# Patient Record
Sex: Female | Born: 1945
Health system: Southern US, Community
[De-identification: ages and names within clinical notes are randomized; demographics above are authoritative.]

## PROBLEM LIST (undated history)

## (undated) DIAGNOSIS — K5730 Diverticulosis of large intestine without perforation or abscess without bleeding: Secondary | ICD-10-CM

## (undated) DIAGNOSIS — L400 Psoriasis vulgaris: Secondary | ICD-10-CM

## (undated) DIAGNOSIS — Z9889 Other specified postprocedural states: Secondary | ICD-10-CM

## (undated) DIAGNOSIS — T8859XA Other complications of anesthesia, initial encounter: Secondary | ICD-10-CM

## (undated) DIAGNOSIS — I059 Rheumatic mitral valve disease, unspecified: Secondary | ICD-10-CM

## (undated) DIAGNOSIS — M1990 Unspecified osteoarthritis, unspecified site: Secondary | ICD-10-CM

## (undated) DIAGNOSIS — J439 Emphysema, unspecified: Secondary | ICD-10-CM

## (undated) DIAGNOSIS — H269 Unspecified cataract: Secondary | ICD-10-CM

## (undated) DIAGNOSIS — A4902 Methicillin resistant Staphylococcus aureus infection, unspecified site: Secondary | ICD-10-CM

## (undated) DIAGNOSIS — R002 Palpitations: Secondary | ICD-10-CM

## (undated) DIAGNOSIS — I10 Essential (primary) hypertension: Secondary | ICD-10-CM

## (undated) DIAGNOSIS — F419 Anxiety disorder, unspecified: Secondary | ICD-10-CM

## (undated) DIAGNOSIS — C801 Malignant (primary) neoplasm, unspecified: Secondary | ICD-10-CM

## (undated) DIAGNOSIS — R011 Cardiac murmur, unspecified: Secondary | ICD-10-CM

## (undated) DIAGNOSIS — K5732 Diverticulitis of large intestine without perforation or abscess without bleeding: Secondary | ICD-10-CM

## (undated) DIAGNOSIS — K219 Gastro-esophageal reflux disease without esophagitis: Secondary | ICD-10-CM

## (undated) DIAGNOSIS — M6702 Short Achilles tendon (acquired), left ankle: Secondary | ICD-10-CM

## (undated) DIAGNOSIS — R000 Tachycardia, unspecified: Secondary | ICD-10-CM

## (undated) DIAGNOSIS — M810 Age-related osteoporosis without current pathological fracture: Secondary | ICD-10-CM

## (undated) DIAGNOSIS — G709 Myoneural disorder, unspecified: Secondary | ICD-10-CM

## (undated) DIAGNOSIS — R112 Nausea with vomiting, unspecified: Secondary | ICD-10-CM

## (undated) DIAGNOSIS — F32A Depression, unspecified: Secondary | ICD-10-CM

## (undated) DIAGNOSIS — K648 Other hemorrhoids: Secondary | ICD-10-CM

## (undated) DIAGNOSIS — J449 Chronic obstructive pulmonary disease, unspecified: Secondary | ICD-10-CM

## (undated) DIAGNOSIS — D649 Anemia, unspecified: Secondary | ICD-10-CM

## (undated) DIAGNOSIS — E559 Vitamin D deficiency, unspecified: Secondary | ICD-10-CM

## (undated) DIAGNOSIS — N736 Female pelvic peritoneal adhesions (postinfective): Secondary | ICD-10-CM

## (undated) DIAGNOSIS — Z5189 Encounter for other specified aftercare: Secondary | ICD-10-CM

## (undated) DIAGNOSIS — R7303 Prediabetes: Secondary | ICD-10-CM

## (undated) DIAGNOSIS — I499 Cardiac arrhythmia, unspecified: Secondary | ICD-10-CM

## (undated) DIAGNOSIS — E7800 Pure hypercholesterolemia, unspecified: Secondary | ICD-10-CM

## (undated) DIAGNOSIS — N879 Dysplasia of cervix uteri, unspecified: Secondary | ICD-10-CM

## (undated) HISTORY — DX: Methicillin resistant Staphylococcus aureus infection, unspecified site: A49.02

## (undated) HISTORY — DX: Anemia, unspecified: D64.9

## (undated) HISTORY — PX: UPPER GASTROINTESTINAL ENDOSCOPY: SHX188

## (undated) HISTORY — PX: ECTOPIC PREGNANCY SURGERY: SHX613

## (undated) HISTORY — DX: Pure hypercholesterolemia, unspecified: E78.00

## (undated) HISTORY — DX: Diverticulosis of large intestine without perforation or abscess without bleeding: K57.30

## (undated) HISTORY — DX: Diverticulitis of large intestine without perforation or abscess without bleeding: K57.32

## (undated) HISTORY — DX: Palpitations: R00.2

## (undated) HISTORY — PX: COLONOSCOPY: SHX174

## (undated) HISTORY — PX: GANGLION CYST EXCISION: SHX1691

## (undated) HISTORY — DX: Dysplasia of cervix uteri, unspecified: N87.9

## (undated) HISTORY — PX: TYMPANOPLASTY: SHX33

## (undated) HISTORY — DX: Psoriasis vulgaris: L40.0

## (undated) HISTORY — DX: Unspecified osteoarthritis, unspecified site: M19.90

## (undated) HISTORY — DX: Vitamin D deficiency, unspecified: E55.9

## (undated) HISTORY — PX: ROTATOR CUFF REPAIR: SHX139

## (undated) HISTORY — DX: Female pelvic peritoneal adhesions (postinfective): N73.6

## (undated) HISTORY — DX: Myoneural disorder, unspecified: G70.9

## (undated) HISTORY — DX: Anxiety disorder, unspecified: F41.9

## (undated) HISTORY — DX: Gastro-esophageal reflux disease without esophagitis: K21.9

## (undated) HISTORY — DX: Emphysema, unspecified: J43.9

## (undated) HISTORY — PX: FINGER SURGERY: SHX640

## (undated) HISTORY — DX: Essential (primary) hypertension: I10

## (undated) HISTORY — DX: Age-related osteoporosis without current pathological fracture: M81.0

## (undated) HISTORY — PX: HEMORRHOID SURGERY: SHX153

## (undated) HISTORY — DX: Rheumatic mitral valve disease, unspecified: I05.9

## (undated) HISTORY — PX: OOPHORECTOMY: SHX86

## (undated) HISTORY — DX: Unspecified cataract: H26.9

## (undated) HISTORY — PX: EYE SURGERY: SHX253

## (undated) HISTORY — DX: Prediabetes: R73.03

## (undated) HISTORY — DX: Cardiac murmur, unspecified: R01.1

## (undated) HISTORY — DX: Tachycardia, unspecified: R00.0

## (undated) HISTORY — DX: Encounter for other specified aftercare: Z51.89

## (undated) HISTORY — PX: ESOPHAGOGASTRODUODENOSCOPY: SHX1529

## (undated) HISTORY — DX: Other hemorrhoids: K64.8

---

## 1974-08-29 DIAGNOSIS — N879 Dysplasia of cervix uteri, unspecified: Secondary | ICD-10-CM

## 1974-08-29 HISTORY — DX: Dysplasia of cervix uteri, unspecified: N87.9

## 1974-08-29 HISTORY — PX: GYNECOLOGIC CRYOSURGERY: SHX857

## 1998-01-11 ENCOUNTER — Inpatient Hospital Stay (HOSPITAL_COMMUNITY): Admission: EM | Admit: 1998-01-11 | Discharge: 1998-01-12 | Payer: Self-pay | Admitting: Emergency Medicine

## 1998-01-13 ENCOUNTER — Encounter (HOSPITAL_COMMUNITY): Admission: RE | Admit: 1998-01-13 | Discharge: 1998-04-13 | Payer: Self-pay

## 1998-04-13 ENCOUNTER — Ambulatory Visit (HOSPITAL_COMMUNITY): Admission: RE | Admit: 1998-04-13 | Discharge: 1998-04-13 | Payer: Self-pay | Admitting: *Deleted

## 1998-05-31 ENCOUNTER — Emergency Department (HOSPITAL_COMMUNITY): Admission: EM | Admit: 1998-05-31 | Discharge: 1998-05-31 | Payer: Self-pay | Admitting: Emergency Medicine

## 1998-05-31 ENCOUNTER — Encounter: Payer: Self-pay | Admitting: Emergency Medicine

## 1998-08-27 ENCOUNTER — Other Ambulatory Visit: Admission: RE | Admit: 1998-08-27 | Discharge: 1998-08-27 | Payer: Self-pay | Admitting: Obstetrics and Gynecology

## 1998-08-29 HISTORY — PX: ABDOMINAL HYSTERECTOMY: SHX81

## 1999-03-29 ENCOUNTER — Ambulatory Visit (HOSPITAL_COMMUNITY): Admission: RE | Admit: 1999-03-29 | Discharge: 1999-03-29 | Payer: Self-pay | Admitting: Gastroenterology

## 1999-04-19 ENCOUNTER — Inpatient Hospital Stay (HOSPITAL_COMMUNITY): Admission: RE | Admit: 1999-04-19 | Discharge: 1999-04-22 | Payer: Self-pay | Admitting: Obstetrics and Gynecology

## 1999-04-19 ENCOUNTER — Encounter (INDEPENDENT_AMBULATORY_CARE_PROVIDER_SITE_OTHER): Payer: Self-pay | Admitting: Specialist

## 1999-07-02 ENCOUNTER — Ambulatory Visit (HOSPITAL_COMMUNITY): Admission: RE | Admit: 1999-07-02 | Discharge: 1999-07-02 | Payer: Self-pay | Admitting: *Deleted

## 2000-09-27 ENCOUNTER — Ambulatory Visit (HOSPITAL_BASED_OUTPATIENT_CLINIC_OR_DEPARTMENT_OTHER): Admission: RE | Admit: 2000-09-27 | Discharge: 2000-09-27 | Payer: Self-pay | Admitting: *Deleted

## 2000-10-18 ENCOUNTER — Ambulatory Visit (HOSPITAL_COMMUNITY): Admission: RE | Admit: 2000-10-18 | Discharge: 2000-10-18 | Payer: Self-pay | Admitting: Internal Medicine

## 2000-10-18 ENCOUNTER — Encounter: Payer: Self-pay | Admitting: Internal Medicine

## 2000-11-02 ENCOUNTER — Other Ambulatory Visit: Admission: RE | Admit: 2000-11-02 | Discharge: 2000-11-02 | Payer: Self-pay | Admitting: Obstetrics and Gynecology

## 2001-11-06 ENCOUNTER — Ambulatory Visit (HOSPITAL_COMMUNITY): Admission: RE | Admit: 2001-11-06 | Discharge: 2001-11-06 | Payer: Self-pay | Admitting: Internal Medicine

## 2001-11-06 ENCOUNTER — Encounter: Payer: Self-pay | Admitting: Internal Medicine

## 2002-02-26 ENCOUNTER — Other Ambulatory Visit: Admission: RE | Admit: 2002-02-26 | Discharge: 2002-02-26 | Payer: Self-pay | Admitting: Obstetrics and Gynecology

## 2002-03-21 ENCOUNTER — Emergency Department (HOSPITAL_COMMUNITY): Admission: EM | Admit: 2002-03-21 | Discharge: 2002-03-21 | Payer: Self-pay | Admitting: Emergency Medicine

## 2002-03-24 ENCOUNTER — Emergency Department (HOSPITAL_COMMUNITY): Admission: EM | Admit: 2002-03-24 | Discharge: 2002-03-24 | Payer: Self-pay | Admitting: *Deleted

## 2002-05-20 ENCOUNTER — Ambulatory Visit (HOSPITAL_COMMUNITY): Admission: RE | Admit: 2002-05-20 | Discharge: 2002-05-20 | Payer: Self-pay | Admitting: Gastroenterology

## 2002-05-20 ENCOUNTER — Encounter: Payer: Self-pay | Admitting: Gastroenterology

## 2003-04-30 ENCOUNTER — Other Ambulatory Visit: Admission: RE | Admit: 2003-04-30 | Discharge: 2003-04-30 | Payer: Self-pay | Admitting: Obstetrics and Gynecology

## 2003-06-17 ENCOUNTER — Emergency Department (HOSPITAL_COMMUNITY): Admission: EM | Admit: 2003-06-17 | Discharge: 2003-06-17 | Payer: Self-pay | Admitting: Emergency Medicine

## 2004-07-13 ENCOUNTER — Ambulatory Visit: Payer: Self-pay | Admitting: Cardiovascular Disease

## 2004-12-21 ENCOUNTER — Ambulatory Visit: Payer: Self-pay

## 2005-01-25 ENCOUNTER — Ambulatory Visit (HOSPITAL_COMMUNITY): Admission: RE | Admit: 2005-01-25 | Discharge: 2005-01-25 | Payer: Self-pay | Admitting: Internal Medicine

## 2005-01-28 ENCOUNTER — Ambulatory Visit (HOSPITAL_COMMUNITY): Admission: RE | Admit: 2005-01-28 | Discharge: 2005-01-28 | Payer: Self-pay | Admitting: Internal Medicine

## 2005-09-07 ENCOUNTER — Ambulatory Visit (HOSPITAL_COMMUNITY): Admission: RE | Admit: 2005-09-07 | Discharge: 2005-09-07 | Payer: Self-pay | Admitting: Internal Medicine

## 2006-06-07 ENCOUNTER — Ambulatory Visit (HOSPITAL_COMMUNITY): Admission: RE | Admit: 2006-06-07 | Discharge: 2006-06-07 | Payer: Self-pay | Admitting: Internal Medicine

## 2006-06-27 ENCOUNTER — Encounter: Admission: RE | Admit: 2006-06-27 | Discharge: 2006-06-27 | Payer: Self-pay | Admitting: Internal Medicine

## 2006-07-13 ENCOUNTER — Ambulatory Visit: Payer: Self-pay | Admitting: Cardiovascular Disease

## 2006-09-27 ENCOUNTER — Ambulatory Visit: Payer: Self-pay | Admitting: Gastroenterology

## 2006-10-10 ENCOUNTER — Ambulatory Visit: Payer: Self-pay | Admitting: Gastroenterology

## 2006-12-01 ENCOUNTER — Ambulatory Visit (HOSPITAL_COMMUNITY): Admission: RE | Admit: 2006-12-01 | Discharge: 2006-12-02 | Payer: Self-pay | Admitting: Surgery

## 2006-12-01 ENCOUNTER — Encounter (INDEPENDENT_AMBULATORY_CARE_PROVIDER_SITE_OTHER): Payer: Self-pay | Admitting: Specialist

## 2007-01-20 ENCOUNTER — Encounter: Admission: RE | Admit: 2007-01-20 | Discharge: 2007-01-20 | Payer: Self-pay | Admitting: Orthopedic Surgery

## 2007-01-29 ENCOUNTER — Ambulatory Visit: Payer: Self-pay | Admitting: Cardiovascular Disease

## 2007-03-06 ENCOUNTER — Encounter: Admission: RE | Admit: 2007-03-06 | Discharge: 2007-03-21 | Payer: Self-pay | Admitting: Orthopedic Surgery

## 2007-07-12 ENCOUNTER — Encounter: Admission: RE | Admit: 2007-07-12 | Discharge: 2007-07-12 | Payer: Self-pay | Admitting: Internal Medicine

## 2008-04-05 ENCOUNTER — Emergency Department (HOSPITAL_COMMUNITY): Admission: EM | Admit: 2008-04-05 | Discharge: 2008-04-05 | Payer: Self-pay | Admitting: Emergency Medicine

## 2008-04-07 ENCOUNTER — Telehealth: Payer: Self-pay | Admitting: Gastroenterology

## 2008-04-22 ENCOUNTER — Ambulatory Visit: Payer: Self-pay | Admitting: Cardiovascular Disease

## 2008-05-06 ENCOUNTER — Ambulatory Visit: Payer: Self-pay

## 2008-05-06 ENCOUNTER — Ambulatory Visit: Payer: Self-pay | Admitting: Gastroenterology

## 2008-05-06 ENCOUNTER — Encounter: Payer: Self-pay | Admitting: Cardiovascular Disease

## 2008-05-06 DIAGNOSIS — K5730 Diverticulosis of large intestine without perforation or abscess without bleeding: Secondary | ICD-10-CM

## 2008-05-06 DIAGNOSIS — K5732 Diverticulitis of large intestine without perforation or abscess without bleeding: Secondary | ICD-10-CM

## 2008-05-06 HISTORY — DX: Diverticulosis of large intestine without perforation or abscess without bleeding: K57.30

## 2008-05-23 ENCOUNTER — Ambulatory Visit: Payer: Self-pay | Admitting: Gastroenterology

## 2008-06-02 ENCOUNTER — Ambulatory Visit: Payer: Self-pay | Admitting: Gastroenterology

## 2008-06-03 ENCOUNTER — Ambulatory Visit: Payer: Self-pay | Admitting: Gastroenterology

## 2008-07-14 ENCOUNTER — Encounter: Admission: RE | Admit: 2008-07-14 | Discharge: 2008-07-14 | Payer: Self-pay | Admitting: Internal Medicine

## 2008-07-29 ENCOUNTER — Ambulatory Visit: Payer: Self-pay | Admitting: Gastroenterology

## 2008-07-31 ENCOUNTER — Other Ambulatory Visit: Admission: RE | Admit: 2008-07-31 | Discharge: 2008-07-31 | Payer: Self-pay | Admitting: Obstetrics and Gynecology

## 2008-07-31 ENCOUNTER — Encounter: Payer: Self-pay | Admitting: Obstetrics and Gynecology

## 2008-07-31 ENCOUNTER — Ambulatory Visit: Payer: Self-pay | Admitting: Obstetrics and Gynecology

## 2008-08-11 ENCOUNTER — Ambulatory Visit: Payer: Self-pay | Admitting: Obstetrics and Gynecology

## 2008-09-02 ENCOUNTER — Ambulatory Visit: Payer: Self-pay | Admitting: Obstetrics and Gynecology

## 2008-09-23 ENCOUNTER — Ambulatory Visit: Payer: Self-pay | Admitting: Gastroenterology

## 2008-10-08 ENCOUNTER — Ambulatory Visit: Payer: Self-pay

## 2008-10-08 ENCOUNTER — Ambulatory Visit: Payer: Self-pay | Admitting: Cardiovascular Disease

## 2008-11-05 ENCOUNTER — Ambulatory Visit: Payer: Self-pay | Admitting: Cardiovascular Disease

## 2008-12-01 ENCOUNTER — Ambulatory Visit: Payer: Self-pay | Admitting: Gastroenterology

## 2009-01-13 ENCOUNTER — Ambulatory Visit: Admission: RE | Admit: 2009-01-13 | Discharge: 2009-01-13 | Payer: Self-pay | Admitting: Internal Medicine

## 2009-01-27 ENCOUNTER — Ambulatory Visit: Payer: Self-pay | Admitting: Gastroenterology

## 2009-03-23 ENCOUNTER — Ambulatory Visit: Payer: Self-pay | Admitting: Gastroenterology

## 2009-04-23 ENCOUNTER — Encounter (INDEPENDENT_AMBULATORY_CARE_PROVIDER_SITE_OTHER): Payer: Self-pay | Admitting: *Deleted

## 2009-06-02 ENCOUNTER — Ambulatory Visit: Payer: Self-pay | Admitting: Gastroenterology

## 2009-06-16 DIAGNOSIS — I059 Rheumatic mitral valve disease, unspecified: Secondary | ICD-10-CM | POA: Insufficient documentation

## 2009-06-18 ENCOUNTER — Ambulatory Visit: Payer: Self-pay | Admitting: Cardiovascular Disease

## 2009-07-28 ENCOUNTER — Ambulatory Visit: Payer: Self-pay | Admitting: Gastroenterology

## 2009-08-04 ENCOUNTER — Ambulatory Visit: Payer: Self-pay | Admitting: Obstetrics and Gynecology

## 2009-08-04 ENCOUNTER — Encounter: Admission: RE | Admit: 2009-08-04 | Discharge: 2009-08-04 | Payer: Self-pay | Admitting: Internal Medicine

## 2009-08-04 ENCOUNTER — Other Ambulatory Visit: Admission: RE | Admit: 2009-08-04 | Discharge: 2009-08-04 | Payer: Self-pay | Admitting: Obstetrics and Gynecology

## 2009-09-28 ENCOUNTER — Ambulatory Visit: Payer: Self-pay | Admitting: Gastroenterology

## 2009-12-01 ENCOUNTER — Ambulatory Visit: Payer: Self-pay | Admitting: Gastroenterology

## 2010-01-26 ENCOUNTER — Ambulatory Visit (HOSPITAL_COMMUNITY): Admission: RE | Admit: 2010-01-26 | Discharge: 2010-01-26 | Payer: Self-pay | Admitting: Internal Medicine

## 2010-01-26 ENCOUNTER — Ambulatory Visit: Payer: Self-pay | Admitting: Gastroenterology

## 2010-03-22 ENCOUNTER — Ambulatory Visit: Payer: Self-pay | Admitting: Gastroenterology

## 2010-06-01 ENCOUNTER — Ambulatory Visit: Payer: Self-pay | Admitting: Gastroenterology

## 2010-06-25 ENCOUNTER — Ambulatory Visit: Payer: Self-pay | Admitting: Cardiovascular Disease

## 2010-08-09 ENCOUNTER — Encounter
Admission: RE | Admit: 2010-08-09 | Discharge: 2010-08-09 | Payer: Self-pay | Source: Home / Self Care | Attending: Internal Medicine | Admitting: Internal Medicine

## 2010-08-10 ENCOUNTER — Other Ambulatory Visit
Admission: RE | Admit: 2010-08-10 | Discharge: 2010-08-10 | Payer: Self-pay | Source: Home / Self Care | Admitting: Obstetrics and Gynecology

## 2010-08-10 ENCOUNTER — Ambulatory Visit: Payer: Self-pay | Admitting: Obstetrics and Gynecology

## 2010-09-18 ENCOUNTER — Encounter: Payer: Self-pay | Admitting: Internal Medicine

## 2010-09-28 NOTE — Procedures (Signed)
Summary: Flexible Sigmoidoscopy  Patient: [name] [name] Note: All result statuses are Final unless otherwise noted.  Tests: (1) Flexible Sigmoidoscopy (FLX)  FLX Flexible Sigmoidoscopy                             DONE     [facility]     [address]     [place], [place]  [address]           FLEXIBLE SIGMOIDOSCOPY PROCEDURE REPORT           PATIENT:  [name], [name]  MR#:  [id number]     BIRTHDATE:  [birth year], 63 yrs. old  GENDER:  female           ENDOSCOPIST:  Barbette Hair. Arlyce Dice, MD     Referred by:           PROCEDURE DATE:  06/01/2010     PROCEDURE:  Flexible Sigmoidoscopy with biopsy     ASA CLASS:  Class II     INDICATIONS:  drug study           MEDICATIONS:   Fentanyl 50 mcg IV, Versed 6 mg IV           DESCRIPTION OF PROCEDURE:   After the risks benefits and     alternatives of the procedure were thoroughly explained, informed     consent was obtained.  Digital rectal exam was performed and     revealed no abnormalities.   The [id number] endoscope was     introduced through the anus and advanced to the descending colon     (60cm)  without limitations.  .  The instrument was then slowly     withdrawn as the mucosa was fully examined.     <<PROCEDUREIMAGES>>           Moderate diverticulosis was found. Moderately severe     diverticulosis with greater than 15 side-mouthed diverticula,     extending from midsigmoid to descending colon (see image1 and     image2). Biopsy taken at 30cm per protocol  The examination was     otherwise normal (see image3 and image4).   Retroflexed views in     the rectum revealed not performed.    The scope was then withdrawn     from the patient and the procedure terminated.           COMPLICATIONS:  None           ENDOSCOPIC IMPRESSION:     1) Moderate diverticulosis     2) Otherwise normal examination.           Recommendations: per protocol           REPEAT EXAM:  No           ______________________________     Barbette Hair. Arlyce Dice, MD           CC:           n.     eSIGNED:   Barbette Hair. Kaplan at 06/01/2010 12:16 PM           [name], [name], [id number]  Note: An exclamation mark (!) indicates a result that was not dispersed into the flowsheet. Document Creation Date: 06/01/2010 12:16 PM _______________________________________________________________________  (1) Order result status: Final Collection or observation date-time: 06/01/2010 12:10 Requested date-time:  Receipt date-time:  Reported date-time:  Referring Physician:   Ordering Physician: Melvia Heaps [phone])  Specimen Source:  Source: Launa Grill Order Number: [id number] Lab site:

## 2010-09-28 NOTE — Assessment & Plan Note (Signed)
Summary: Z6X      Allergies Added: NKDA   Primary Provider:  Marisue Brooklyn, MD   History of Present Illness: [name] [name] is seen today for F/U of palpitatiions, PAC's and smoking  She denies SSCP, dyspnea, palpitations or syncope.  She has no known structural heart disease.  She continues to smoke 1ppd.  She enjoys it and it is not stress related.  She has quit on muliple occasions most recently this summer with the aid of Chantix.  Behavior modicfication aids were discussed as well as setting a date.  She indicates she may want to use nicotine patches and this would be fine.  Long term risk of vascular disease and cancer were discussed.  No niew issues with primary Dr Elisabeth Most who has checked a CXR this year  ECG reviewed today is normal SR 92  Lipid Management History:      Positive NCEP/ATP III risk factors include female age 64 years old or older and current tobacco user.    Current Problems (verified): 1)  Mixed Hyperlipidemia  (ICD-272.2) 2)  Smoker  (ICD-305.1) 3)  Palpitations  (ICD-785.1) 4)  Mitral Valve Prolapse  (ICD-424.0) 5)  Diverticulitis-colon  (ICD-562.11) 6)  Hemorrhoids, External  (ICD-455.3) 7)  Hemorrhoids, Internal  (ICD-455.0) 8)  Diverticulosis, Colon  (ICD-562.10)  Current Medications (verified): 1)  Metoprolol Tartrate 25 Mg Tabs (Metoprolol Tartrate) .... Take One Tablet By Mouth Twice A Day 2)  Ambien 10 Mg Tabs (Zolpidem Tartrate) .... As Needed 3)  Pravastatin Sodium 20 Mg Tabs (Pravastatin Sodium) .... Take One Tablet By Mouth Daily At Bedtime 4)  Cvs Vit D 5000 High-Potency 5000 Unit Caps (Cholecalciferol) .Marland Kitchen.. 1 Tab By Mouth Once Daily  Allergies (verified): No Known Drug Allergies  Past History:  Past Medical History: Last updated: 06/16/2009 Current Problems:  PALPITATIONS (ICD-785.1) MITRAL VALVE PROLAPSE (ICD-424.0) DIVERTICULITIS-COLON (ICD-562.11) HEMORRHOIDS, EXTERNAL (ICD-455.3) HEMORRHOIDS, INTERNAL (ICD-455.0) DIVERTICULOSIS,  COLON (ICD-562.10)  Past Surgical History: Last updated: 05/06/2008 Artificial eardrum Etopic pregnancy x 2 Hemorrhoidectomy Hysterectomy Rotator Cuff Repair  Family History: Last updated: 05/06/2008 Family History of Colon Cancer:Grandmother Family History of Uterine Cancer:Sister Family History of Heart Disease: Father Family History of Irritable Bowel Syndrome:Mother  Social History: Last updated: 05/06/2008 Occupation: Receiptionist Patient currently smokes.  Alcohol Use - yes- wine Daily Caffeine Use 2 cups Illicit Drug Use - no Patient does not get regular exercise.   Review of Systems       Denies fever, malais, weight loss, blurry vision, decreased visual acuity, cough, sputum, SOB, hemoptysis, pleuritic pain, palpitaitons, heartburn, abdominal pain, melena, lower extremity edema, claudication, or rash.   Vital Signs:  Patient profile:   65 year old female Height:      64 inches Weight:      133 pounds BMI:     22.91 Pulse rate:   92 / minute Resp:     12 per minute BP sitting:   120 / 74  (left arm)  Vitals Entered By: Kem Parkinson (June 25, 2010 2:25 PM)  Physical Exam  General:  Affect appropriate Healthy:  appears stated age HEENT: normal Neck supple with no adenopathy JVP normal no bruits no thyromegaly Lungs clear with no wheezing and good diaphragmatic motion Heart:  S1/S2 no murmur,rub, gallop or click PMI normal Abdomen: benighn, BS positve, no tenderness, no AAA no bruit.  No HSM or HJR Distal pulses intact with no bruits No edema Neuro non-focal Skin warm and dry    Impression & Recommendations:  Problem #  1:  MIXED HYPERLIPIDEMIA (ICD-272.2) Continue statin labs with primary Her updated medication list for this problem includes:    Pravastatin Sodium 20 Mg Tabs (Pravastatin sodium) .Marland Kitchen... Take one tablet by mouth daily at bedtime  Problem # 2:  SMOKER (ICD-305.1) Chantix or patches to be started Encouraged her to call [name]  with concerns or referal to [facility]  Problem # 3:  PALPITATIONS (ICD-785.1) Resolved on BB with no structural heart disease Her updated medication list for this problem includes:    Metoprolol Tartrate 25 Mg Tabs (Metoprolol tartrate) .Marland Kitchen... Take one tablet by mouth twice a day  Problem # 4:  MITRAL VALVE PROLAPSE (ICD-424.0) No murmur on exam stable No need for SBE Her updated medication list for this problem includes:    Metoprolol Tartrate 25 Mg Tabs (Metoprolol tartrate) .Marland Kitchen... Take one tablet by mouth twice a day  Lipid Assessment/Plan:      Based on NCEP/ATP III, the patient's risk factor category is "2 or more risk factors and a calculated 10 year CAD risk of > 20%".  The patient's lipid goals are as follows: Total cholesterol goal is 200; LDL cholesterol goal is 130; HDL cholesterol goal is 40; Triglyceride goal is 150.    Patient Instructions: 1)  Your physician recommends that you schedule a follow-up appointment in: ONE YEAR

## 2011-01-11 NOTE — Assessment & Plan Note (Signed)
[facility]                            CARDIOLOGY OFFICE NOTE   NAME:[name] [name], [name] [name]                   MRN:          [id number]  DATE:10/08/2008                            DOB:          06/29/1946    [name] returns today for followup.  She has had increasing  palpitations that have been going on for a last few months that  typically occur at night.  I think they can be related to stress.  She  also drinks wine 2-3 times per week and this can sometimes exacerbate  her palpitations.  She had quit smoking up and until this past October.  Unfortunately, she started again.  She states she just enjoys it.  She  is not a stress smoker.  She initially quit with help of Chantix.   Since I last saw the patient, the palpitations have been almost on a  daily basis.  They are rapid.  They last anywhere from 5-10 minutes.  They can be recurrent.  They are not associated with shortness of  breath, diaphoresis, or chest pain.  She has not had syncope.   Outside of wine and stress, the patient does not know any other  exacerbating factors and there is nothing that she can do to easily  relieve them.   She has been on a beta-blocker and has been compliant with this.   REVIEW OF SYSTEMS:  Remarkable for 2 bouts of diverticulitis this fall.  She is on a study drug called Lialda per Dr. Arlyce Dice.   ALLERGIES:  She has no known allergies.   MEDICATIONS:  She is otherwise on;  1. Zolpidem 10 mg p.r.n.  2. Lopressor 25 b.i.d.  3. Research study drug. She uses  __________.   PHYSICAL EXAMINATION:  GENERAL:  Remarkable for an elderly female in no  distress.  VITAL SIGNS:  She weighs 133; blood pressure 116/76; pulse 79 and  regular; and respiratory rate 14, afebrile.  HEENT:  Unremarkable.  NECK:  Carotids are normal without bruit.  No lymphadenopathy,  thyromegaly, or JVP elevation.  LUNGS:  Clear, good diaphragmatic motion.  No wheezing.  CARDIAC:  S1  and S2.  Normal heart sounds.  PMI normal.  ABDOMEN:  Benign.  Bowel sounds positive.  No AAA.  No tenderness.  No  bruit.  No hepatosplenomegaly.  No hepatojugular reflux.  EXTREMITIES:  Distal pulses are intact.  No edema.  NEURO:  Nonfocal.  SKIN:  Warm and dry.  MUSCULOSKELETAL:  No muscular weakness.   IMPRESSION:  1. Palpitations, likely benign.  Continue beta-blocker.  Follow up      with event monitor.  Reassess in 4 weeks.  2. Alcohol intake, moderate.  However, does exacerbate palpitations.      Try to decrease intake.  3. Diverticulitis.  Follow up with Dr. Arlyce Dice.  Continue with research      study drug.  She may be on placebo, but hopefully she will not have      any recurrences.  4. Smoking cessation.  Encouraged the patient to restart Chantix,      offered to refill  for her.  I suspect these are also exacerbating      her palpitations given the nicotine load.   Further recommendations will be based on the results of her event  monitor and followup visit in 4 weeks.     [name]. Eden Emms, MD, [facility]  Electronically Signed    PCN/MedQ  DD: 10/08/2008  DT: 10/09/2008  Job #: [id number]

## 2011-01-11 NOTE — Assessment & Plan Note (Signed)
[facility]                            CARDIOLOGY OFFICE NOTE   NAME:[name] [name], [name] [name]                   MRN:          [id number]  DATE:11/05/2008                            DOB:          26-Feb-1946    [place] returns today for followup.  She has had a distant history of  mitral valve prolapse with palpitations.  Her event monitor was  reviewed, there were over 16 pages.  She had sinus rhythm and sinus  arrhythmia with no significant PACs, PVCs, or tachy palpitations.   I told the patient that I thought her palpitations were benign and they  may not even represent cardiac malfunction.   I suspect she is having muscle twitches.  She has been on chronic low-  dose beta-blocker since she feels better on them.   She continues to smoke, we talked about this.  She has quit twice in the  past, once for a year and once for 5 years.  She had good results with  Chantix.  She continues to want to think about picking a time to quit  with her husband.  She will call me if she wants to restart her Chantix,  otherwise she is doing well.  She is not having chest pain, PND, or  orthopnea.  There is no lower extremity edema.  No syncope.  No  exertional dyspnea.  Affect and mood are appropriate.  She has not had  any fevers, chills, URIs, and there have been no intercurrent medical  illnesses or hospitalizations.   MEDICATIONS:  1. She is on zolpidem 10 mg a day.  2. Lopressor 25 b.i.d.   PHYSICAL EXAMINATION:  GENERAL:  Remarkable for healthy-appearing female  in no distress.  VITAL SIGNS:  Blood pressure is 120/70, pulse 70 and regular, weight  134, respirations 14.  HEENT:  Unremarkable.  NECK:  Carotids are normal without bruit.  No lymphadenopathy,  thyromegaly, or JVP elevation.  LUNGS:  Clear.  Good diaphragmatic motion.  No wheezing.  CARDIAC:  S1, S2.  Normal heart sounds.  PMI normal.  ABDOMEN:  Benign.  Bowel sounds positive.  No AAA, no  tenderness, no  bruit.  No hepatosplenomegaly or hepatojugular reflux.  No tenderness.  EXTREMITIES:  No edema.  NEUROLOGIC:  Nonfocal.  SKIN:  Warm and dry.  MUSCULOSKELETAL:  No muscular weakness.   IMPRESSION:  1. Palpitations, benign.  No evidence of arrhythmia on event monitor.      Continue low-dose beta-blocker.  2. Smoking cessation.  Continue to try to cut back.  The patient will      call for [name] to call in Chantix.  She will hopefully set up a quit      date with her husband soon.     [name]. Eden Emms, MD, [facility]  Electronically Signed    PCN/MedQ  DD: 11/05/2008  DT: 11/06/2008  Job #: [id number]

## 2011-01-11 NOTE — Assessment & Plan Note (Signed)
[facility]                            CARDIOLOGY OFFICE NOTE   NAME:Antonelli, IDALYS KONECNY                   MRN:          [id number]  DATE:01/29/2007                            DOB:          [birth year]    [name] is seen today in followup.  I have seen her for palpitations  in the past, they have resolved. She has not had any significant  recurrences.  There has been no significant chest pain, PND or  orthopnea.  Her palpitations in the past were thought to be benign.  She  has no significant structural heart disease.   I have also followed her for her smoking.   She has been Chantix since last October.  She had about a 4 week period  where she had stopped it.  There is a question of her being more testy  and having a shorter temper.   We talked about this for a bit today.  She was counseled for less than  10 minutes.  We came up with a game plan to possibly switch her to  Wellbutrin 150 XL once a day, to try to get her off the Chantix and have  her not relapse with her smoking.  She will followup with Marisue Brooklyn,  D.O.  If she decides to do this, I gave a prescription today.  We talked  a bit also about the recent reports about changes in mental status,  dreams and erratic behavior on Chantix.   Fortunately though, Latria has not smoked since October.   Her cardiac risk factors are otherwise well modified.  She had smoked  1/2 pack a day for greater than 25 years.  She does have a positive  family history for coronary disease.   REVIEW OF SYSTEMS:  Otherwise negative.   EXAM TODAY:  Is remarkable for a healthy appearing middle-aged female  with premature graying hair.  Weight is 149, blood pressure 112/64,  pulse 82 and regular, respiratory rate is 12, she is afebrile.  HEENT:  Normal.  NECK:  Supple.  There is no thyromegaly, no lymphadenopathy, no JVP  elevation, no thyromegaly.  LUNGS:  Clear with no wheezing and normal  diaphragmatic motion.  There  is an S1, S2 with normal heart sounds.  PMI is normal.  ABDOMEN:  Benign.  Bowel sounds are positive.  There is no tenderness,  no masses, no organomegaly, no hepatosplenomegaly, no hepatojugular  reflux, no AAA.  Femorals are +3 bilaterally without bruit, PTs are +2 bilaterally.  There is no lower extremity edema or varicosities.  NEURO:  Nonfocal.  SKIN:  Warm and dry.  There is no muscular weakness.   Her only medications included Chantix 1 mg b.i.d.   IMPRESSION:  1. Stable.  Previous palpitations resolved.  No further therapy      indicated.  Previously normal Myoview and echocardiogram, no      evident structural heart disease.  2. Smoking.  On Chantix.  Will likely switch to Wellbutrin XL 150 a      day and try to continue to abstain from her smoking.  Otherwise, she is doing fairly well.  She will follow up with her  primary M.D.  Her EKG was totally normal today.     [name]. Eden Emms, MD, [facility]  Electronically Signed    PCN/MedQ  DD: 01/29/2007  DT: 01/29/2007  Job #: [id number]

## 2011-01-11 NOTE — Assessment & Plan Note (Signed)
[facility]                            CARDIOLOGY OFFICE NOTE   NAME:[name] [name], [name] [name]                   MRN:          [id number]  DATE:04/22/2008                            DOB:          [birth year]    J[place]ica returns today in followup.  I have seen her in the past for  benign palpitations.  She has not had previous evidence of structural  heart disease, but she has not had a stress test or echo in quite some  time.  She has had increasing palpitations over the last few months.  Unfortunately, she started smoking again.  I talked to her at length  about this.  She is smoking about a quarter-pack a day.  She previously  has quit with the help of Chantix and Wellbutrin.  She does not seem  motivated to quit at this time.  She is having increasing problems with  diverticulitis and needs to see Dr. Arlyce Dice.  She was in the ER recently  for it.   Her palpitations sound benign.  In fact, she did have some PACs and PVCs  here in the office.   I suspect that some of the pain from her diverticulitis and smoking with  nicotine have increased them.  I told her that we would probably  reassess her LV function to make sure it is still normal.  She had  previously been taking Lopressor, her prescription is out-of-date.  I  told her for the time being she should go back on it a half a tablet  twice a day.   Review of systems is otherwise negative.   PHYSICAL EXAMINATION:  GENERAL:  Her exam is remarkable for healthy-  appearing elderly white female in no distress.  VITAL SIGNS:  Pulse is 77 with occasional PACs and PVCs.  Blood pressure  is 127/78, respiratory rate 14, and afebrile.  Weight is 135.  HEENT:  Unremarkable.  NECK:  Carotids are without bruit.  No lymphadenopathy, thyromegaly, or  JVP elevation.  LUNGS:  Clear with good diaphragmatic motion.  No wheezing.  HEART:  S1 and S2.  Normal heart sounds.  PMI normal.  ABDOMEN:  Benign.  Bowel sounds  positive.  No bruit, no tenderness, and  no hepatosplenomegaly, and hepatojugular reflux.  No tenderness.  EXTREMITIES:  Distal pulses are intact.  No edema.  NEURO:  Nonfocal.  SKIN:  Warm and dry.  MUSCULOSKELETAL:  No muscular weakness.   EKG shows sinus rhythm with premature atrial conduction.   IMPRESSION:  1. Palpitations with premature atrial contractions and premature      ventricular contractions.  Start Lopressor 25 b.i.d.  Follow up in      6 months.  Check 2-D echocardiogram to reassess RV and LV function.  2. Diverticulitis.  High-fiber diet.  Follow up with Dr. Arlyce Dice from      GI.  Consider followup colonoscopy.  3. Smoking cessation.  Counseled for less than 10 minutes.  The      patient will call us if she wants Wellbutrin or Chantix.  I called      back in  long-term risk and terms of developing cardiovascular      disease and lung cancer were discussed at length.  I will see her      back in 6 months unless her palpitations would worsen in which case      she will need an event monitor.     [name]. Eden Emms, MD, [facility]  Electronically Signed    PCN/MedQ  DD: 04/22/2008  DT: 04/23/2008  Job #: [id number]

## 2011-01-14 NOTE — Op Note (Signed)
[place]  Patient:    [name] [name], [name] [name]                      MRN: [id number] Proc. Date: 09/27/00 Adm. Date:  [id number] Attending:  Kendell Bane CC:         Marinus Maw, M.D.   Operative Report  PREOPERATIVE DIAGNOSIS:  Septic arthritis, interphalangeal joint of left thumb.  POSTOPERATIVE DIAGNOSIS:  Septic arthritis, interphalangeal joint of left thumb.  OPERATION PERFORMED:  Arthrotomy for incision and drainage of interphalangeal joint of left thumb.  SURGEON:  Lowell Bouton, M.D.  ANESTHESIA:  General.  OPERATIVE FINDINGS:  The patient had a previous dog bite with a puncture wound directly over the interphalangeal joint.  There was serosanguinous fluid present in the joint upon opening it.  DESCRIPTION OF PROCEDURE:  Under general anesthesia with a tourniquet on the left arm, the left hand was prepped and draped in the usual fashion and after elevating the limb, the tourniquet was inflated to 250 mmHg.  A transverse incision was made on the dorsum of the IP joint of the left thumb.  Blunt dissection was carried down to the extensor tendon and the joint was entered just ulnar to the tendon.  The synovium was opened with the scissors and cultures were obtained.  There was serous and sanguinous type fluid present with no gross purulence.  After obtaining aerobic and anaerobic cultures, the joint was irrigated copiously with saline.  An iodoform packing was inserted and the wound was left open.  Sterile dressings were applied.  The tourniquet was released with good circulation.  The patient went to the recovery room awake and stable in good condition. DD:  09/27/00 TD:  09/27/00 Job: [id number]

## 2011-01-14 NOTE — Assessment & Plan Note (Signed)
 HEALTHCARE                         GASTROENTEROLOGY OFFICE NOTE   NAME:Raya, NASTEHO GLANTZ                   MRN:          [id number]  DATE:09/27/2006                            DOB:          07-28-1946    PROBLEM:  Rectal bleeding.   Mrs. [place] has returned for evaluation of bleeding.  She is having  bright red blood per rectum with bowel movements.  She has protruding  hemorrhoids.  She has a history of hemorrhoids which were ligated in  2003.  She denies abdominal or rectal pain.   PAST MEDICAL HISTORY:  Is pertinent for arrhythmias.  She is status post  hysterectomy.   FAMILY HISTORY:  Is pertinent for a sister with uterine cancer and  father with heart disease.   MEDICATIONS:  Include Chantix.   She has no allergies.   She neither smokes nor drinks. She is married, works as a Scientist, physiological.   REVIEW OF SYSTEMS:  Is positive for joint pains and back pain.   PHYSICAL EXAMINATION:  Pulse 72.  Blood pressure 118/64, weight 144.  HEENT:  EOMI. PERRLA. Sclerae are anicteric.  Conjunctivae are pink.  NECK:  Supple without thyromegaly, adenopathy or carotid bruits.  CHEST:  Clear to auscultation and percussion without adventitious  sounds.  CARDIAC:  Regular rhythm; normal S1 S2.  There are no murmurs, gallops  or rubs.  ABDOMEN:  Bowel sounds are normoactive.  Abdomen is soft, non-tender and  non-distended.  There are no abdominal masses, tenderness, splenic  enlargement or hepatomegaly.  EXTREMITIES:  Full range of motion.  No cyanosis, clubbing or edema.  RECTAL:  She has grade 3 hemorrhoids and external skin tags.   IMPRESSION:  Symptomatic hemorrhoids characterized by gross bleeding.   RECOMMENDATIONS:  1. Surgical hemorrhoidectomy.  2. Colonoscopy prior to number 1.     Barbette Hair. Arlyce Dice, MD,FACG  Electronically Signed    RDK/MedQ  DD: 09/27/2006  DT: 09/27/2006  Job #: [id number]   cc:   Lovenia Kim, D.O.  [name]. Eden Emms,  MD, [facility]  [place] Daphine Deutscher, MD

## 2011-01-14 NOTE — Op Note (Signed)
[name] [name], [name] [name]            ACCOUNT NO.:  [id number]   MEDICAL RECORD NO.:  [id number]          PATIENT TYPE:  OIB   LOCATION:  0098                         FACILITY:  [facility]   PHYSICIAN:  Thornton Park. Daphine Deutscher, MD  DATE OF BIRTH:  1946-03-09   DATE OF PROCEDURE:  DATE OF DISCHARGE:                               OPERATIVE REPORT   PREOPERATIVE DIAGNOSIS:  Prolapsing hemorrhoids   POSTOPERATIVE DIAGNOSIS:  Prolapsing hemorrhoids.   PROCEDURE:  Stapling procedure for prolapsing hemorrhoids.   SURGEON:  Dr. Luretha Murphy.   ASSISTANT:  Dr. Baruch Merl.   ANESTHESIA:  General endotracheal.   POSITIONING:  The jackknife prone position.   DESCRIPTION OF PROCEDURE:  This is a 65 year old white female with  bleeding and known polyps.  Informed consent was obtained regarding this  procedure.  She was taken to room 12 on December 01, 2006, given general  anesthesia and rolled into the prone position, which was then converted  into the jackknife position with proper padding and her arms up. The  anus was retracted with cloth tape and the perianal region was then  prepped with Techni-Care and draped sterilely.   The hemorrhoidal piles were injected each with about 3 mL of a mixture  of 0.25% Marcaine with Wydase. Then perineal block was then performed  using 0.25% Marcaine with epinephrine surrounding the anus.  About 15 mL  was used in this effort.   The anus was dilated with three fingers and the medium-sized bullet  retractor was inserted.  A pursestring suture was then placed  approximately 4 cm proximal to the dentate line.  The PPH stapling  device was then inserted above this and the pursestring tied down around  the anvil.  It was then closed and we waited for 45 seconds and then  fired the stapling device.  We removed the stapling device and had a  good complete ring of tissue that appeared to have the hemorrhoids and  mucosa.   We then inspected the staple line and no  bleeding was noted.  A large  piece of Gelfoam was inserted and left across the staple line which was  intact and not bleeding.  The patient was then awakened and taken to the  recovery room.  She will be given Vicodin for pain and will be followed-  up in the office in 2-3 weeks.      Thornton Park Daphine Deutscher, MD  Electronically Signed     MBM/MEDQ  D:  12/01/2006  T:  12/01/2006  Job:  [id number]   cc:   Lovenia Kim, D.O.  Fax: [phone]   Barbette Hair. Arlyce Dice, MD,FACG  [address]  [place]  [place] [address]   [name]. Eden Emms, MD, [facility]  [address]  [address]  [place]  [place] [address]

## 2011-01-14 NOTE — Assessment & Plan Note (Signed)
[facility]                              CARDIOLOGY OFFICE NOTE   NAME:[name] [name] COHENOUR                   MRN:          [id number]  DATE:07/13/2006                            DOB:          [birth year]    She is doing well. She has had MVP with nonpalpitations in the past. She  actually has stopped smoking for four weeks. The Chantix she is on seems to  help.   She has not had any significant syncope or significant palpitations. I told  her that given the new SP guidelines she probably does not need SP  prophylaxis.   REVIEW OF SYSTEMS:  Otherwise, unremarkable. She has not had her flu shot,  and I encouraged her to get this.   MEDICATIONS:  Her only medication is Chantix. I told her to start taking a  baby aspirin a day.   PHYSICAL EXAMINATION:  HEENT:  Normal.  NECK:  There is no lymphadenopathy, no thyromegaly, no carotid bruits.  LUNGS:  Clear. There is an S1, S2 with no murmur, rub, gallop or click.  ABDOMEN:  Benign.  EXTREMITIES:  Lower extremities:  Good pulses, no edema.  SKIN:  Warm and dry.  NEUROLOGICAL:  Nonfocal.   EKG today is essentially normal.   IMPRESSION:  Previous benign palpitations with mild prolapse. No need for SB  prophylaxis. I congratulated the patient on not smoking. She will continue  her Chantix probably for another four weeks.   I will see her back in six months. She will call us if she has any increase  in her palpitations or problems with her nicotine dependence.     [name] [name]. [name] Emms, MD, [facility]  Electronically Signed    PCN/MedQ  DD: 07/13/2006  DT: 07/13/2006  Job #: [id number]

## 2011-05-27 LAB — DIFFERENTIAL
Lymphs Abs: 1.5
Monocytes Relative: 3
Neutro Abs: 10.6 — ABNORMAL HIGH
Neutrophils Relative %: 85 — ABNORMAL HIGH

## 2011-05-27 LAB — CBC
HCT: 37.7
Platelets: 291
RDW: 14.1
WBC: 12.5 — ABNORMAL HIGH

## 2011-05-27 LAB — URINALYSIS, ROUTINE W REFLEX MICROSCOPIC
Glucose, UA: NEGATIVE
Protein, ur: NEGATIVE
Specific Gravity, Urine: 1.02
pH: 6.5

## 2011-05-27 LAB — LIPASE, BLOOD: Lipase: 21

## 2011-05-27 LAB — COMPREHENSIVE METABOLIC PANEL
AST: 23
Albumin: 3.4 — ABNORMAL LOW
Alkaline Phosphatase: 45
BUN: 7
Chloride: 105
GFR calc Af Amer: >60
Potassium: 4
Total Protein: 5.8 — ABNORMAL LOW

## 2011-05-27 LAB — URINE MICROSCOPIC-ADD ON

## 2011-05-31 ENCOUNTER — Encounter: Payer: Self-pay | Admitting: Cardiovascular Disease

## 2011-06-01 ENCOUNTER — Ambulatory Visit (INDEPENDENT_AMBULATORY_CARE_PROVIDER_SITE_OTHER): Payer: BC Managed Care – PPO | Admitting: Cardiovascular Disease

## 2011-06-01 ENCOUNTER — Encounter: Payer: Self-pay | Admitting: Cardiovascular Disease

## 2011-06-01 DIAGNOSIS — Z87891 Personal history of nicotine dependence: Secondary | ICD-10-CM

## 2011-06-01 DIAGNOSIS — R002 Palpitations: Secondary | ICD-10-CM

## 2011-06-01 DIAGNOSIS — F17200 Nicotine dependence, unspecified, uncomplicated: Secondary | ICD-10-CM

## 2011-06-01 DIAGNOSIS — E782 Mixed hyperlipidemia: Secondary | ICD-10-CM

## 2011-06-01 NOTE — Assessment & Plan Note (Signed)
Cholesterol is at goal.  Continue current dose of statin and diet Rx.  No myalgias or side effects.  F/U  LFT's in 6 months. No results found for this basename: LDLCALC             

## 2011-06-01 NOTE — Assessment & Plan Note (Signed)
Improved continue beta blocker  

## 2011-06-01 NOTE — Progress Notes (Signed)
[name] [name] is seen today for F/U of palpitatiions, PAC's and smoking She denies SSCP, dyspnea, palpitations or syncope. She has no known structural heart disease. She has stopped smoking since April.  I congratulated her on this.  She used Chantix   Needs a CXR still as has not had one in over a year.   No new issues with primary Dr [name] [name] is leaving and she is not sure who her new doctor will be  Lipid Management History:   Positive NCEP/ATP III risk factors include female age 64 years old or older and current tobacco user.   ROS: Denies fever, malais, weight loss, blurry vision, decreased visual acuity, cough, sputum, SOB, hemoptysis, pleuritic pain, palpitaitons, heartburn, abdominal pain, melena, lower extremity edema, claudication, or rash.  All other systems reviewed and negative  General: Affect appropriate Healthy:  appears stated age HEENT: normal Neck supple with no adenopathy JVP normal no bruits no thyromegaly Lungs clear with no wheezing and good diaphragmatic motion Heart:  S1/S2 no murmur,rub, gallop or click PMI normal Abdomen: benighn, BS positve, no tenderness, no AAA no bruit.  No HSM or HJR Distal pulses intact with no bruits No edema Neuro non-focal Skin warm and dry No muscular weakness   Current Outpatient Prescriptions  Medication Sig Dispense Refill  . Cholecalciferol (VITAMIN D3) 5000 UNITS TABS Take 1 tablet by mouth daily.        . metoprolol tartrate (LOPRESSOR) 25 MG tablet Take 25 mg by mouth 2 (two) times daily.        . pravastatin (PRAVACHOL) 20 MG tablet Take 20 mg by mouth daily.        Marland Kitchen zolpidem (AMBIEN) 10 MG tablet Take 10 mg by mouth at bedtime.          Allergies  Review of patient's allergies indicates no known allergies.  Electrocardiogram: NSR 65 normal ECG  Assessment and Plan

## 2011-06-01 NOTE — Assessment & Plan Note (Signed)
Congratulated her on cessation. CXR.  Risk of CA still higher than average for next 5 years

## 2011-06-01 NOTE — Patient Instructions (Signed)
Your physician wants you to follow-up in: YEAR WITH DR Haywood Filler will receive a reminder letter in the mail two months in advance. If you don't receive a letter, please call our office to schedule the follow-up appointment.  Your physician recommends that you continue on your current medications as directed. Please refer to the Current Medication list given to you today.  A chest x-ray takes a picture of the organs and structures inside the chest, including the heart, lungs, and blood vessels. 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

## 2011-06-21 ENCOUNTER — Ambulatory Visit (INDEPENDENT_AMBULATORY_CARE_PROVIDER_SITE_OTHER)
Admission: RE | Admit: 2011-06-21 | Discharge: 2011-06-21 | Disposition: A | Discharge status: Home / Self Care | Payer: BC Managed Care – PPO | Source: Ambulatory Visit | Attending: Cardiovascular Disease | Admitting: Cardiovascular Disease

## 2011-06-21 DIAGNOSIS — Z87891 Personal history of nicotine dependence: Secondary | ICD-10-CM

## 2011-06-23 ENCOUNTER — Telehealth: Payer: Self-pay | Admitting: Cardiovascular Disease

## 2011-06-23 NOTE — Telephone Encounter (Signed)
Pt rtn call re test results °

## 2011-06-23 NOTE — Telephone Encounter (Signed)
Pt states she received a phone call yesterday and thinks it is about her cxr.  Pt was notified that cxr was normal per Dr Eden Emms.

## 2011-08-03 ENCOUNTER — Encounter: Payer: Self-pay | Admitting: Cardiovascular Disease

## 2011-08-29 ENCOUNTER — Other Ambulatory Visit: Payer: Self-pay | Admitting: Cardiovascular Disease

## 2011-08-31 DIAGNOSIS — N809 Endometriosis, unspecified: Secondary | ICD-10-CM | POA: Insufficient documentation

## 2011-08-31 DIAGNOSIS — N736 Female pelvic peritoneal adhesions (postinfective): Secondary | ICD-10-CM | POA: Insufficient documentation

## 2011-08-31 DIAGNOSIS — D219 Benign neoplasm of connective and other soft tissue, unspecified: Secondary | ICD-10-CM | POA: Insufficient documentation

## 2011-09-07 ENCOUNTER — Encounter: Payer: Self-pay | Admitting: Obstetrics and Gynecology

## 2011-09-07 ENCOUNTER — Ambulatory Visit (INDEPENDENT_AMBULATORY_CARE_PROVIDER_SITE_OTHER): Payer: BC Managed Care – PPO | Admitting: Obstetrics and Gynecology

## 2011-09-07 ENCOUNTER — Other Ambulatory Visit: Payer: Self-pay | Admitting: Obstetrics and Gynecology

## 2011-09-07 VITALS — BP 112/60 | Ht 64.0 in | Wt 142.0 lb

## 2011-09-07 DIAGNOSIS — Z01419 Encounter for gynecological examination (general) (routine) without abnormal findings: Secondary | ICD-10-CM

## 2011-09-07 LAB — URINALYSIS, ROUTINE W REFLEX MICROSCOPIC
Glucose, UA: NEGATIVE mg/dL
Leukocytes, UA: NEGATIVE
Nitrite: NEGATIVE
Protein, ur: NEGATIVE mg/dL
pH: 6.5 (ref 5.0–8.0)

## 2011-09-07 LAB — URINALYSIS, MICROSCOPIC ONLY
Casts: NONE SEEN
WBC, UA: NONE SEEN WBC/hpf (ref <3)

## 2011-09-07 NOTE — Progress Notes (Addendum)
Patient came to see me today for her annual GYN exam. She is doing fine without HRT. She is due for her yearly mammogram. She has had a normal bone density. She does her lab through PCP. She is having no vaginal bleeding. She is having no pelvic pain. Patient has microscopic hematuria and has had a full workup. She saw the urologist last year.  HEENT: Within normal limits. Kennon Portela present Neck: No masses. Supraclavicular lymph nodes: Not enlarged. Breasts: Examined in both sitting and lying position. Symmetrical without skin changes or masses. Abdomen: Soft no masses guarding or rebound. No hernias. Pelvic: External within normal limits. BUS within normal limits. Vaginal examination shows good estrogen effect, no cystocele enterocele or rectocele. Cervix and uterus absent. Adnexa within normal limits. Rectovaginal confirmatory. Extremities within normal limits.  Assessment: Normal GYN exam  Plan: Mammogram

## 2012-02-01 ENCOUNTER — Encounter: Payer: Self-pay | Admitting: Cardiovascular Disease

## 2012-02-08 ENCOUNTER — Ambulatory Visit (HOSPITAL_COMMUNITY)
Admission: RE | Admit: 2012-02-08 | Discharge: 2012-02-08 | Disposition: A | Discharge status: Home / Self Care | Payer: BC Managed Care – PPO | Source: Ambulatory Visit | Attending: Internal Medicine | Admitting: Internal Medicine

## 2012-02-08 ENCOUNTER — Other Ambulatory Visit (HOSPITAL_COMMUNITY): Payer: Self-pay | Admitting: Internal Medicine

## 2012-02-08 DIAGNOSIS — K5730 Diverticulosis of large intestine without perforation or abscess without bleeding: Secondary | ICD-10-CM | POA: Insufficient documentation

## 2012-02-08 DIAGNOSIS — I059 Rheumatic mitral valve disease, unspecified: Secondary | ICD-10-CM | POA: Insufficient documentation

## 2012-02-08 DIAGNOSIS — R002 Palpitations: Secondary | ICD-10-CM | POA: Insufficient documentation

## 2012-02-08 DIAGNOSIS — Z09 Encounter for follow-up examination after completed treatment for conditions other than malignant neoplasm: Secondary | ICD-10-CM

## 2012-02-08 DIAGNOSIS — Z87891 Personal history of nicotine dependence: Secondary | ICD-10-CM | POA: Insufficient documentation

## 2012-12-05 ENCOUNTER — Encounter: Payer: Self-pay | Admitting: Gynecology

## 2012-12-05 ENCOUNTER — Other Ambulatory Visit (HOSPITAL_COMMUNITY)
Admission: RE | Admit: 2012-12-05 | Discharge: 2012-12-05 | Disposition: A | Discharge status: Home / Self Care | Payer: Medicare Other | Source: Ambulatory Visit | Attending: Gynecology | Admitting: Gynecology

## 2012-12-05 ENCOUNTER — Ambulatory Visit (INDEPENDENT_AMBULATORY_CARE_PROVIDER_SITE_OTHER): Payer: Medicare Other | Admitting: Gynecology

## 2012-12-05 VITALS — BP 120/76 | Ht 64.0 in | Wt 128.0 lb

## 2012-12-05 DIAGNOSIS — Z124 Encounter for screening for malignant neoplasm of cervix: Secondary | ICD-10-CM | POA: Insufficient documentation

## 2012-12-05 DIAGNOSIS — IMO0002 Reserved for concepts with insufficient information to code with codable children: Secondary | ICD-10-CM

## 2012-12-05 DIAGNOSIS — Z1272 Encounter for screening for malignant neoplasm of vagina: Secondary | ICD-10-CM

## 2012-12-05 DIAGNOSIS — N952 Postmenopausal atrophic vaginitis: Secondary | ICD-10-CM

## 2012-12-05 NOTE — Patient Instructions (Signed)
Call to Schedule your mammogram  Facilities in [place]: 1)  [facility], [address] Phone: [phone] 2)  [facility]. [facility], [address] Phone: [phone] 3)  Dr. Bertrand at [facility]  [address] Phone: [phone]     Mammogram A mammogram is an X-ray test to find changes in a woman's breast. You should get a mammogram if:  You are 40 years of age or older  You have risk factors.   Your doctor recommends that you have one.  BEFORE THE TEST  Do not schedule the test the week before your period, especially if your breasts are sore during this time.  On the day of your mammogram:  Wash your breasts and armpits well. After washing, do not put on any deodorant or talcum powder on until after your test.   Eat and drink as you usually do.   Take your medicines as usual.   If you are diabetic and take insulin, make sure you:   Eat before coming for your test.   Take your insulin as usual.   If you cannot keep your appointment, call before the appointment to cancel. Schedule another appointment.  TEST  You will need to undress from the waist up. You will put on a hospital gown.   Your breast will be put on the mammogram machine, and it will press firmly on your breast with a piece of plastic called a compression paddle. This will make your breast flatter so that the machine can X-ray all parts of your breast.   Both breasts will be X-rayed. Each breast will be X-rayed from above and from the side. An X-ray might need to be taken again if the picture is not good enough.   The mammogram will last about 15 to 30 minutes.  AFTER THE TEST Finding out the results of your test Ask when your test results will be ready. Make sure you get your test results.  Document Released: 11/11/2008 Document Revised: 08/04/2011 Document Reviewed: 11/11/2008 ExitCare Patient  Information 2012 [employer].  Consider Stop Smoking.  Help is available at [facility]'s smoking cessation program @ www.[facility].com or 336-832-0838. OR 1-800-QUIT-NOW (1-800-784-8669) for free smoking cessation counseling.  Smokefree.gov (http://www.smokefree.gov) provides free, accurate, evidence-based information and professional assistance to help support the immediate and long-term needs of people trying to quit smoking.    Smoking Hazards Smoking cigarettes is extremely bad for your health. Tobacco smoke has over 200 known poisons in it. There are over 60 chemicals in tobacco smoke that cause cancer. Some of the chemicals found in cigarette smoke include:  Cyanide.  Benzene.  Formaldehyde.  Methanol (wood alcohol).  Acetylene (fuel used in welding torches).  Ammonia.  Cigarette smoke also contains the poisonous gases nitrogen oxide and carbon monoxide.  Cigarette smokers have an increased risk of many serious medical problems, including: Lung cancer.  Lung disease (such as pneumonia, bronchitis, and emphysema).  Heart attack and chest pain due to the heart not getting enough oxygen (angina).  Heart disease and peripheral blood vessel disease.  Hypertension.  Stroke.  Oral cancer (cancer of the lip, mouth, or voice box).  Bladder cancer.  Pancreatic cancer.  Cervical cancer.  Pregnancy complications, including premature birth.  Low birthweight babies.  Early menopause.  Lower estrogen level for women.  Infertility.  Facial wrinkles.  Blindness.  Increased risk of broken bones (fractures).  Senile dementia.    Stillbirths and smaller newborn babies, birth defects, and genetic damage to sperm.  Stomach ulcers and internal bleeding.  Children of smokers have an increased risk of the following, because of secondhand smoke exposure:  Sudden infant death syndrome (SIDS).  Respiratory infections.  Lung cancer.  Heart disease.  Ear infections.  Smoking causes  approximately: 90% of all lung cancer deaths in men.  80% of all lung cancer deaths in women.  90% of deaths from chronic obstructive lung disease.  Compared with nonsmokers, smoking increases the risk of: Coronary heart disease by 2 to 4 times.  Stroke by 2 to 4 times.  Men developing lung cancer by 23 times.  Women developing lung cancer by 13 times.  Dying from chronic obstructive lung diseases by 12 times.  Someone who smokes 2 packs a day loses about 8 years of his or her life. Even smoking lightly shortens your life expectancy by several years. You can greatly reduce the risk of medical problems for you and your family by stopping now. Smoking is the most preventable cause of death and disease in our society. Within days of quitting smoking, your circulation returns to normal, you decrease the risk of having a heart attack, and your lung capacity improves. There may be some increased phlegm in the first few days after quitting, and it may take months for your lungs to clear up completely. Quitting for 10 years cuts your lung cancer risk to almost that of a nonsmoker. WHY IS SMOKING ADDICTIVE? Nicotine is the chemical agent in tobacco that is capable of causing addiction or dependence.  When you smoke and inhale, nicotine is absorbed rapidly into the bloodstream through your lungs. Nicotine absorbed through the lungs is capable of creating a powerful addiction. Both inhaled and non-inhaled nicotine may be addictive.  Addiction studies of cigarettes and spit tobacco show that addiction to nicotine occurs mainly during the teen years, when young people begin using tobacco products.  WHAT ARE THE BENEFITS OF QUITTING?  There are many health benefits to quitting smoking.  Likelihood of developing cancer and heart disease decreases. Health improvements are seen almost immediately.  Blood pressure, pulse rate, and breathing patterns start returning to normal soon after quitting.  People who quit  may see an improvement in their overall quality of life.  Some people choose to quit all at once. Other options include nicotine replacement products, such as patches, gum, and nasal sprays. Do not use these products without first checking with your caregiver. QUITTING SMOKING It is not easy to quit smoking. Nicotine is addicting, and longtime habits are hard to change. To start, you can write down all your reasons for quitting, tell your family and friends you want to quit, and ask for their help. Throw your cigarettes away, chew gum or cinnamon sticks, keep your hands busy, and drink extra water or juice. Go for walks and practice deep breathing to relax. Think of all the money you are saving: around $1,000 a year, for the average pack-a-day smoker. Nicotine patches and gum have been shown to improve success at efforts to stop smoking. Zyban (bupropion) is an anti-depressant drug that can be prescribed to reduce nicotine withdrawal symptoms and to suppress the urge to smoke. Smoking is an addiction with both physical and psychological effects. Joining a stop-smoking support group can help you cope with the emotional issues. For more information and advice on programs to stop smoking, call your doctor, your local hospital, or these organizations: American Lung Association -   1-800-LUNGUSA   Smoking Cessation  This document explains the best ways for you to quit smoking and new treatments to help. It lists new medicines that can double or triple your chances of quitting and quitting for good. It also considers ways to avoid relapses and concerns you may have about quitting, including weight gain. NICOTINE: A POWERFUL ADDICTION If you have tried to quit smoking, you know how hard it can be. It is hard because nicotine is a very addictive drug. For some people, it can be as addictive as heroin or cocaine. Usually, people make 2 or 3 tries, or more, before finally being able to quit. Each time you try to  quit, you can learn about what helps and what hurts. Quitting takes hard work and a lot of effort, but you can quit smoking. QUITTING SMOKING IS ONE OF THE MOST IMPORTANT THINGS YOU WILL EVER DO.  You will live longer, feel better, and live better.   The impact on your body of quitting smoking is felt almost immediately:   Within 20 minutes, blood pressure decreases. Pulse returns to its normal level.   After 8 hours, carbon monoxide levels in the blood return to normal. Oxygen level increases.   After 24 hours, chance of heart attack starts to decrease. Breath, hair, and body stop smelling like smoke.   After 48 hours, damaged nerve endings begin to recover. Sense of taste and smell improve.   After 72 hours, the body is virtually free of nicotine. Bronchial tubes relax and breathing becomes easier.   After 2 to 12 weeks, lungs can hold more air. Exercise becomes easier and circulation improves.   Quitting will reduce your risk of having a heart attack, stroke, cancer, or lung disease:   After 1 year, the risk of coronary heart disease is cut in half.   After 5 years, the risk of stroke falls to the same as a nonsmoker.   After 10 years, the risk of lung cancer is cut in half and the risk of other cancers decreases significantly.   After 15 years, the risk of coronary heart disease drops, usually to the level of a nonsmoker.   If you are pregnant, quitting smoking will improve your chances of having a healthy baby.   The people you live with, especially your children, will be healthier.   You will have extra money to spend on things other than cigarettes.  FIVE KEYS TO QUITTING Studies have shown that these 5 steps will help you quit smoking and quit for good. You have the best chances of quitting if you use them together: 1. Get ready.  2. Get support and encouragement.  3. Learn new skills and behaviors.  4. Get medicine to reduce your nicotine addiction and use it  correctly.  5. Be prepared for relapse or difficult situations. Be determined to continue trying to quit, even if you do not succeed at first.  1. GET READY  Set a quit date.   Change your environment.   Get rid of ALL cigarettes, ashtrays, matches, and lighters in your home, car, and place of work.   Do not let people smoke in your home.   Review your past attempts to quit. Think about what worked and what did not.   Once you quit, do not smoke. NOT EVEN A PUFF!  2. GET SUPPORT AND ENCOURAGEMENT Studies have shown that you have a better chance of being successful if you have help. You can get support   in many ways.  Tell your family, friends, and coworkers that you are going to quit and need their support. Ask them not to smoke around you.   Talk to your caregivers (doctor, dentist, nurse, pharmacist, psychologist, and/or smoking counselor).   Get individual, group, or telephone counseling and support. The more counseling you have, the better your chances are of quitting. Programs are available at local hospitals and health centers. Call your local health department for information about programs in your area.   Spiritual beliefs and practices may help some smokers quit.   Quit meters are small computer programs online or downloadable that keep track of quit statistics, such as amount of "quit-time," cigarettes not smoked, and money saved.   Many smokers find one or more of the many self-help books available useful in helping them quit and stay off tobacco.  3. LEARN NEW SKILLS AND BEHAVIORS  Try to distract yourself from urges to smoke. Talk to someone, go for a walk, or occupy your time with a task.   When you first try to quit, change your routine. Take a different route to work. Drink tea instead of coffee. Eat breakfast in a different place.   Do something to reduce your stress. Take a hot bath, exercise, or read a book.   Plan something enjoyable to do every day. Reward  yourself for not smoking.   Explore interactive web-based programs that specialize in helping you quit.  4. GET MEDICINE AND USE IT CORRECTLY Medicines can help you stop smoking and decrease the urge to smoke. Combining medicine with the above behavioral methods and support can quadruple your chances of successfully quitting smoking. The U.S. Food and Drug Administration (FDA) has approved 7 medicines to help you quit smoking. These medicines fall into 3 categories.  Nicotine replacement therapy (delivers nicotine to your body without the negative effects and risks of smoking):   Nicotine gum: Available over-the-counter.   Nicotine lozenges: Available over-the-counter.   Nicotine inhaler: Available by prescription.   Nicotine nasal spray: Available by prescription.   Nicotine skin patches (transdermal): Available by prescription and over-the-counter.   Antidepressant medicine (helps people abstain from smoking, but how this works is unknown):   Bupropion sustained-release (SR) tablets: Available by prescription.   Nicotinic receptor partial agonist (simulates the effect of nicotine in your brain):   Varenicline tartrate tablets: Available by prescription.   Ask your caregiver for advice about which medicines to use and how to use them. Carefully read the information on the package.   Everyone who is trying to quit may benefit from using a medicine. If you are pregnant or trying to become pregnant, nursing an infant, you are under age 18, or you smoke fewer than 10 cigarettes per day, talk to your caregiver before taking any nicotine replacement medicines.   You should stop using a nicotine replacement product and call your caregiver if you experience nausea, dizziness, weakness, vomiting, fast or irregular heartbeat, mouth problems with the lozenge or gum, or redness or swelling of the skin around the patch that does not go away.   Do not use any other product containing nicotine  while using a nicotine replacement product.   Talk to your caregiver before using these products if you have diabetes, heart disease, asthma, stomach ulcers, you had a recent heart attack, you have high blood pressure that is not controlled with medicine, a history of irregular heartbeat, or you have been prescribed medicine to help you quit smoking.  5.   BE PREPARED FOR RELAPSE OR DIFFICULT SITUATIONS  Most relapses occur within the first 3 months after quitting. Do not be discouraged if you start smoking again. Remember, most people try several times before they finally quit.   You may have symptoms of withdrawal because your body is used to nicotine. You may crave cigarettes, be irritable, feel very hungry, cough often, get headaches, or have difficulty concentrating.   The withdrawal symptoms are only temporary. They are strongest when you first quit, but they will go away within 10 to 14 days.  Here are some difficult situations to watch for:  Alcohol. Avoid drinking alcohol. Drinking lowers your chances of successfully quitting.   Caffeine. Try to reduce the amount of caffeine you consume. It also lowers your chances of successfully quitting.   Other smokers. Being around smoking can make you want to smoke. Avoid smokers.   Weight gain. Many smokers will gain weight when they quit, usually less than 10 pounds. Eat a healthy diet and stay active. Do not let weight gain distract you from your main goal, quitting smoking. Some medicines that help you quit smoking may also help delay weight gain. You can always lose the weight gained after you quit.   Bad mood or depression. There are a lot of ways to improve your mood other than smoking.  If you are having problems with any of these situations, talk to your caregiver. SPECIAL SITUATIONS AND CONDITIONS Studies suggest that everyone can quit smoking. Your situation or condition can give you a special reason to quit.  Pregnant women/new  mothers: By quitting, you protect your baby's health and your own.   Hospitalized patients: By quitting, you reduce health problems and help healing.   Heart attack patients: By quitting, you reduce your risk of a second heart attack.   Lung, head, and neck cancer patients: By quitting, you reduce your chance of a second cancer.   Parents of children and adolescents: By quitting, you protect your children from illnesses caused by secondhand smoke.  QUESTIONS TO THINK ABOUT Think about the following questions before you try to stop smoking. You may want to talk about your answers with your caregiver.  Why do you want to quit?   If you tried to quit in the past, what helped and what did not?   What will be the most difficult situations for you after you quit? How will you plan to handle them?   Who can help you through the tough times? Your family? Friends? Caregiver?   What pleasures do you get from smoking? What ways can you still get pleasure if you quit?  Here are some questions to ask your caregiver:  How can you help me to be successful at quitting?   What medicine do you think would be best for me and how should I take it?   What should I do if I need more help?   What is smoking withdrawal like? How can I get information on withdrawal?  Quitting takes hard work and a lot of effort, but you can quit smoking.   

## 2012-12-05 NOTE — Progress Notes (Signed)
[name] [name] 05-12-1946 [id number]        67 y.o.  G2P0020 for followup exam.  Several issues noted below. Former patient of Dr. Eda Paschal.  Past medical history,surgical history, medications, allergies, family history and social history were all reviewed and documented in the EPIC chart. ROS:  Was performed and pertinent positives and negatives are included in the history.  Exam: Kim assistant Filed Vitals:   12/05/12 1425  BP: 120/76  Height: 5\' 4"  (1.626 m)  Weight: 128 lb (58.06 kg)   General appearance  Normal Skin grossly normal Head/Neck normal with no cervical or supraclavicular adenopathy thyroid normal Lungs  clear Cardiac RR, without RMG Abdominal  soft, nontender, without masses, organomegaly or hernia Breasts  examined lying and sitting without masses, retractions, discharge or axillary adenopathy. Pelvic  Ext/BUS/vagina  normal with atrophic changes  Adnexa  Without masses or tenderness    Anus and perineum  normal   Rectovaginal  normal sphincter tone without palpated masses or tenderness.    Assessment/Plan:  67 y.o. G17P0020 female for annual exam.   1. Atrophic vaginitis. Patient symptomatic with dyspareunia. Had tried OTC lubricants without benefit. I reviewed options to include moisturizers/lubricants to include vegetable oil, Vagifem, Estrace cream, Osphena, systemic HRT. The risks/benefits, pros/cons of each choice discussed. WHI study with increased risk of stroke heart attack DVT reviewed. Patient does not want prescription solutions at this time but wants to try OTC products. Follow up if continues to be an issue. 2. Status post TAH BSO for adenomyosis/endometriosis/leiomyoma/pelvic adhesions. Doing well continue to monitor. 3. History of UTIs. Check baseline urinalysis today. 4. DEXA 2010 normal. Recommend repeat at 5 year interval next year. Calcium vitamin D recommendations reviewed. Does have vitamin D level checked at her primary physician  historically. 5. Mammography 2011.  Strongly recommended scheduling mammogram now. Benefits of early detection discussed. Patient agrees to schedule. SBE monthly reviewed. 6. Colon screening 2011. Followup therapy recommended interval. 7. Pap smear 2011. Discussed current screening guidelines. Does have history of dysplasia 1976.  Is status post hysterectomy for benign indications. I did a baseline Pap smear now assuming negative then we'll plan stop screening. 8. Stop smoking reviewed. 9. Health maintenance. Patient has older blood work done through her primary physician's office. Follow up one year, sooner as needed.    [name] MD, 3:10 PM 12/05/2012

## 2012-12-05 NOTE — Addendum Note (Signed)
Addended by: Dayna Barker on: 12/05/2012 03:24 PM   Modules accepted: Orders

## 2012-12-11 ENCOUNTER — Encounter: Payer: Self-pay | Admitting: Obstetrics and Gynecology

## 2012-12-11 LAB — URINALYSIS W MICROSCOPIC + REFLEX CULTURE

## 2012-12-12 ENCOUNTER — Telehealth: Payer: Self-pay

## 2012-12-12 MED ORDER — METOPROLOL TARTRATE 25 MG PO TABS: ORAL_TABLET | ORAL | Status: DC

## 2012-12-12 NOTE — Telephone Encounter (Signed)
pt called rto rqst refill for lopressor refill sent for 60 r-1 to [facility].pt will call back to schedule follow up visit.

## 2013-01-08 ENCOUNTER — Encounter: Payer: Self-pay | Admitting: Cardiovascular Disease

## 2013-01-08 ENCOUNTER — Ambulatory Visit (INDEPENDENT_AMBULATORY_CARE_PROVIDER_SITE_OTHER): Payer: Medicare Other | Admitting: Cardiovascular Disease

## 2013-01-08 VITALS — BP 103/62 | HR 61 | Ht 64.0 in | Wt 126.0 lb

## 2013-01-08 DIAGNOSIS — I059 Rheumatic mitral valve disease, unspecified: Secondary | ICD-10-CM

## 2013-01-08 DIAGNOSIS — R002 Palpitations: Secondary | ICD-10-CM

## 2013-01-08 DIAGNOSIS — E782 Mixed hyperlipidemia: Secondary | ICD-10-CM

## 2013-01-08 DIAGNOSIS — F17200 Nicotine dependence, unspecified, uncomplicated: Secondary | ICD-10-CM

## 2013-01-08 NOTE — Assessment & Plan Note (Signed)
Resolved normal ECG Observe

## 2013-01-08 NOTE — Patient Instructions (Signed)
Your physician wants you to follow-up in: YEAR WITH DR NISHAN  You will receive a reminder letter in the mail two months in advance. If you don't receive a letter, please call our office to schedule the follow-up appointment.  Your physician recommends that you continue on your current medications as directed. Please refer to the Current Medication list given to you today. 

## 2013-01-08 NOTE — Assessment & Plan Note (Signed)
Cholesterol is at goal.  Continue current dose of statin and diet Rx.  No myalgias or side effects.  F/U  LFT's in 6 months. No results found for this basename: LDLCALC  Labs with primary            

## 2013-01-08 NOTE — Assessment & Plan Note (Signed)
Quit last two weeks Doing well with patch Step down to 14 mg in 2 weeks

## 2013-01-08 NOTE — Assessment & Plan Note (Signed)
No murmur on exam today Stable

## 2013-01-08 NOTE — Progress Notes (Signed)
Patient ID: [name] [name], female   DOB: [birth year], 67 y.o.   MRN: [id number] [name] [name] is seen today for F/U of palpitatiions, PAC's and smoking She denies SSCP, dyspnea, palpitations or syncope. She has no known structural heart disease. She has stopped smoking since April. I congratulated her on this. She used Chantix Needs a CXR still as has not had one in over a year. No new issues with primary Dr Elisabeth Most is leaving and she is not sure who her new doctor will be  Lipid Management History:  Positive NCEP/ATP III risk factors include female age 2 years old or older and current tobacco user.  On statin   Labs and CXR to be followed by new primary Dr Marvel Plan next month  Counseled on smoking cessation Wearing patch the last 2 weeks Seems motivated to stay quit  ROS: Denies fever, malais, weight loss, blurry vision, decreased visual acuity, cough, sputum, SOB, hemoptysis, pleuritic pain, palpitaitons, heartburn, abdominal pain, melena, lower extremity edema, claudication, or rash.  All other systems reviewed and negative  General: Affect appropriate Healthy:  appears stated age HEENT: normal Neck supple with no adenopathy JVP normal no bruits no thyromegaly Lungs clear with no wheezing and good diaphragmatic motion Heart:  S1/S2 no murmur, no rub, gallop or click PMI normal Abdomen: benighn, BS positve, no tenderness, no AAA no bruit.  No HSM or HJR Distal pulses intact with no bruits No edema Neuro non-focal Skin warm and dry No muscular weakness   Current Outpatient Prescriptions  Medication Sig Dispense Refill  . B Complex Vitamins (B COMPLEX PO) Take 1 tablet by mouth daily.       . Cholecalciferol (VITAMIN D3) 5000 UNITS TABS Take 1 tablet by mouth daily.        Marland Kitchen LORazepam (ATIVAN) 1 MG tablet Take 1 mg by mouth as needed.       Marland Kitchen MAGNESIUM PO Take 1 tablet by mouth daily.       . metoprolol tartrate (LOPRESSOR) 25 MG tablet TAKE ONE TABLET BY MOUTH TWICE A DAY  60  tablet  1  . pravastatin (PRAVACHOL) 20 MG tablet Take 20 mg by mouth daily.         No current facility-administered medications for this visit.    Allergies  Review of patient's allergies indicates no known allergies.  Electrocardiogram:  SR rate 61 normal ECG  Assessment and Plan

## 2013-01-24 ENCOUNTER — Other Ambulatory Visit (HOSPITAL_COMMUNITY): Payer: Self-pay | Admitting: Internal Medicine

## 2013-01-24 ENCOUNTER — Ambulatory Visit (HOSPITAL_COMMUNITY)
Admission: RE | Admit: 2013-01-24 | Discharge: 2013-01-24 | Disposition: A | Discharge status: Home / Self Care | Payer: Medicare Other | Source: Ambulatory Visit | Attending: Internal Medicine | Admitting: Internal Medicine

## 2013-01-24 DIAGNOSIS — R5381 Other malaise: Secondary | ICD-10-CM | POA: Insufficient documentation

## 2013-01-24 DIAGNOSIS — F17200 Nicotine dependence, unspecified, uncomplicated: Secondary | ICD-10-CM | POA: Insufficient documentation

## 2013-01-24 DIAGNOSIS — R5383 Other fatigue: Secondary | ICD-10-CM | POA: Insufficient documentation

## 2013-01-24 DIAGNOSIS — R05 Cough: Secondary | ICD-10-CM | POA: Insufficient documentation

## 2013-01-24 DIAGNOSIS — R059 Cough, unspecified: Secondary | ICD-10-CM | POA: Insufficient documentation

## 2013-01-24 DIAGNOSIS — I700 Atherosclerosis of aorta: Secondary | ICD-10-CM | POA: Insufficient documentation

## 2013-01-24 DIAGNOSIS — R091 Pleurisy: Secondary | ICD-10-CM | POA: Insufficient documentation

## 2013-01-25 ENCOUNTER — Other Ambulatory Visit: Payer: Self-pay | Admitting: Internal Medicine

## 2013-01-25 DIAGNOSIS — N6320 Unspecified lump in the left breast, unspecified quadrant: Secondary | ICD-10-CM

## 2013-01-29 ENCOUNTER — Ambulatory Visit
Admission: RE | Admit: 2013-01-29 | Discharge: 2013-01-29 | Disposition: A | Discharge status: Home / Self Care | Payer: Medicare Other | Source: Ambulatory Visit | Attending: Internal Medicine | Admitting: Internal Medicine

## 2013-01-29 DIAGNOSIS — N6320 Unspecified lump in the left breast, unspecified quadrant: Secondary | ICD-10-CM

## 2013-02-15 ENCOUNTER — Other Ambulatory Visit: Payer: Self-pay | Admitting: *Deleted

## 2013-02-15 MED ORDER — METOPROLOL TARTRATE 25 MG PO TABS: ORAL_TABLET | ORAL | Status: DC

## 2013-02-20 ENCOUNTER — Other Ambulatory Visit: Payer: Self-pay | Admitting: *Deleted

## 2013-02-20 MED ORDER — METOPROLOL TARTRATE 25 MG PO TABS: ORAL_TABLET | ORAL | Status: DC

## 2013-07-03 ENCOUNTER — Telehealth: Payer: Self-pay | Admitting: Emergency Medicine

## 2013-07-03 MED ORDER — SERTRALINE HCL 50 MG PO TABS: 50.0000 mg | ORAL_TABLET | Freq: Every day | ORAL | Status: DC

## 2013-07-03 NOTE — Telephone Encounter (Signed)
REFILL ON SERTRALINE HCL 50MG  QTY 30

## 2013-07-04 ENCOUNTER — Other Ambulatory Visit: Payer: Self-pay | Admitting: Cardiovascular Disease

## 2013-08-06 ENCOUNTER — Other Ambulatory Visit: Payer: Self-pay | Admitting: Internal Medicine

## 2013-08-14 ENCOUNTER — Encounter: Payer: Self-pay | Admitting: Internal Medicine

## 2013-08-14 DIAGNOSIS — E559 Vitamin D deficiency, unspecified: Secondary | ICD-10-CM | POA: Insufficient documentation

## 2013-08-14 DIAGNOSIS — I10 Essential (primary) hypertension: Secondary | ICD-10-CM | POA: Insufficient documentation

## 2013-08-14 DIAGNOSIS — R7309 Other abnormal glucose: Secondary | ICD-10-CM | POA: Insufficient documentation

## 2013-08-15 ENCOUNTER — Ambulatory Visit: Payer: Medicare Other | Admitting: Emergency Medicine

## 2013-08-15 ENCOUNTER — Encounter: Payer: Self-pay | Admitting: Emergency Medicine

## 2013-08-15 VITALS — BP 104/62 | HR 60 | Temp 98.0°F | Resp 16 | Ht 64.0 in | Wt 122.0 lb

## 2013-08-15 DIAGNOSIS — R5381 Other malaise: Secondary | ICD-10-CM

## 2013-08-15 DIAGNOSIS — F329 Major depressive disorder, single episode, unspecified: Secondary | ICD-10-CM

## 2013-08-15 DIAGNOSIS — E782 Mixed hyperlipidemia: Secondary | ICD-10-CM

## 2013-08-15 DIAGNOSIS — E538 Deficiency of other specified B group vitamins: Secondary | ICD-10-CM

## 2013-08-15 DIAGNOSIS — E559 Vitamin D deficiency, unspecified: Secondary | ICD-10-CM

## 2013-08-15 DIAGNOSIS — K047 Periapical abscess without sinus: Secondary | ICD-10-CM

## 2013-08-15 DIAGNOSIS — R319 Hematuria, unspecified: Secondary | ICD-10-CM

## 2013-08-15 DIAGNOSIS — R7303 Prediabetes: Secondary | ICD-10-CM

## 2013-08-15 DIAGNOSIS — I10 Essential (primary) hypertension: Secondary | ICD-10-CM

## 2013-08-15 LAB — CBC WITH DIFFERENTIAL/PLATELET
Basophils Absolute: 0 10*3/uL (ref 0.0–0.1)
Basophils Relative: 0 % (ref 0–1)
HCT: 33.5 % — ABNORMAL LOW (ref 36.0–46.0)
Hemoglobin: 11.5 g/dL — ABNORMAL LOW (ref 12.0–15.0)
Lymphocytes Relative: 29 % (ref 12–46)
MCHC: 34.3 g/dL (ref 30.0–36.0)
Monocytes Absolute: 0.4 10*3/uL (ref 0.1–1.0)
Monocytes Relative: 7 % (ref 3–12)
Neutro Abs: 4 10*3/uL (ref 1.7–7.7)
Neutrophils Relative %: 62 % (ref 43–77)
WBC: 6.5 10*3/uL (ref 4.0–10.5)

## 2013-08-15 LAB — BASIC METABOLIC PANEL WITH GFR
BUN: 10 mg/dL (ref 6–23)
CO2: 29 mEq/L (ref 19–32)
Chloride: 101 mEq/L (ref 96–112)
Glucose, Bld: 90 mg/dL (ref 70–99)
Potassium: 4.7 mEq/L (ref 3.5–5.3)
Sodium: 138 mEq/L (ref 135–145)

## 2013-08-15 LAB — LIPID PANEL
Cholesterol: 193 mg/dL (ref 0–200)
Total CHOL/HDL Ratio: 2.2 Ratio

## 2013-08-15 LAB — HEPATIC FUNCTION PANEL
ALT: 8 U/L (ref 0–35)
AST: 14 U/L (ref 0–37)
Albumin: 4.2 g/dL (ref 3.5–5.2)
Alkaline Phosphatase: 48 U/L (ref 39–117)
Total Bilirubin: 0.4 mg/dL (ref 0.3–1.2)
Total Protein: 6.5 g/dL (ref 6.0–8.3)

## 2013-08-15 LAB — HEMOGLOBIN A1C
Hgb A1c MFr Bld: 5.6 % (ref <5.7)
Mean Plasma Glucose: 114 mg/dL (ref <117)

## 2013-08-15 NOTE — Patient Instructions (Signed)
Abscessed Tooth  An abscessed tooth is an infection around your tooth. It may be caused by holes or damage to the tooth (cavity) or a dental disease. An abscessed tooth causes mild to very bad pain in and around the tooth. See your dentist right away if you have tooth or gum pain.  HOME CARE   Take your medicine as told. Finish it even if you start to feel better.   Do not drive after taking pain medicine.   Rinse your mouth (gargle) often with salt water ( teaspoon salt in 8 ounces of warm water).   Do not apply heat to the outside of your face.  GET HELP RIGHT AWAY IF:    You have a temperature by mouth above 102 F (38.9 C), not controlled by medicine.   You have chills and a very bad headache.   You have problems breathing or swallowing.   Your mouth will not open.   You develop puffiness (swelling) on the neck or around the eye.   Your pain is not helped by medicine.   Your pain is getting worse instead of better.  MAKE SURE YOU:    Understand these instructions.   Will watch your condition.   Will get help right away if you are not doing well or get worse.  Document Released: 02/01/2008 Document Revised: 11/07/2011 Document Reviewed: 11/23/2010  ExitCare Patient Information 2014 [employer].

## 2013-08-15 NOTE — Progress Notes (Signed)
Subjective:    Patient ID: [name] [name], female    DOB: May 08, 1946, 68 y.o.   MRN: [id number]  HPI Comments: 67 yo female presents for 3 month F/U for HTN, Cholesterol, Pre-Dm, D. Deficient. She has not been exercising routinely but keeps busy. She eats descent. LAST LABS T 187 TG 71 H 78 LDL 95 A1C 5.8 MAG 1.9 D 86  She is concerned with continued depression, fatigue, anger increase and weight loss. Weight verified and is stable since last 3 month OV. She had neg CXR 12/2012. She notes she stays stressed but it has not increased. She admits to decreased appetite. She feels more easily agitated or angry.   She is currently on ABX per dentist for abscess tooth. She notes mild discomfort but has improved with abx, she has f/u scheduled for removal.  She noticed over the weekend blood in her underwear and after urinating with wiping. She notes full hysterectomy several years ago with out any history of abnormalities. She denies any sight of blood in toliet with urination. She denies any other symptoms.  Hypertension  Hyperlipidemia   Current Outpatient Prescriptions on File Prior to Visit  Medication Sig Dispense Refill  . B Complex Vitamins (B COMPLEX PO) Take 1 tablet by mouth daily.       . Cholecalciferol (VITAMIN D3) 5000 UNITS TABS Take 1 tablet by mouth daily.        Marland Kitchen LORazepam (ATIVAN) 1 MG tablet Take 1 mg by mouth 2 (two) times daily.       Marland Kitchen MAGNESIUM PO Take 1 tablet by mouth daily.       . metoprolol tartrate (LOPRESSOR) 25 MG tablet TAKE ONE TABLET BY MOUTH TWICE A DAY  60 tablet  5  . pravastatin (PRAVACHOL) 20 MG tablet TAKE 1 TABLET BY MOUTH DAILY  90 tablet  3  . sertraline (ZOLOFT) 50 MG tablet Take 1 tablet (50 mg total) by mouth daily.  30 tablet  12   No current facility-administered medications on file prior to visit.   ALLERGIES Ketek  Past Medical History  Diagnosis Date  . Palpitations   . Mitral valve disorders   . Diverticulitis of colon (without  mention of hemorrhage)   . Internal hemorrhoids without mention of complication   . Diverticulosis of colon (without mention of hemorrhage)   . Cervical dysplasia 1976  . Elevated cholesterol   . MRSA infection   . Pelvic adhesive disease   . Hypertension   . Prediabetes   . Vitamin D deficiency        Review of Systems  Constitutional: Positive for fatigue.  HENT: Positive for dental problem.   Genitourinary: Positive for vaginal bleeding.  Psychiatric/Behavioral: Positive for agitation. Negative for suicidal ideas. The patient is nervous/anxious.   All other systems reviewed and are negative.   BP 104/62  Pulse 60  Temp(Src) 98 F (36.7 C) (Temporal)  Resp 16  Ht 5\' 4"  (1.626 m)  Wt 122 lb (55.339 kg)  BMI 20.93 kg/m2     Objective:   Physical Exam  Nursing note and vitals reviewed. Constitutional: She is oriented to person, place, and time. She appears well-developed and well-nourished. No distress.  HENT:  Head: Normocephalic and atraumatic.  Right Ear: External ear normal.  Left Ear: External ear normal.  Nose: Nose normal.  Mouth/Throat: Oropharynx is clear and moist. No oropharyngeal exudate.    Eyes: Conjunctivae and EOM are normal.  Neck: Normal range of motion. Neck  supple. No JVD present. No thyromegaly present.  Cardiovascular: Normal rate, regular rhythm, normal heart sounds and intact distal pulses.   Pulmonary/Chest: Effort normal and breath sounds normal.  Abdominal: Soft. Bowel sounds are normal. She exhibits no distension and no mass. There is no tenderness. There is no rebound and no guarding.  Genitourinary:  IF URINE NEG REF GYN  Musculoskeletal: Normal range of motion. She exhibits no edema and no tenderness.  Lymphadenopathy:    She has no cervical adenopathy.  Neurological: She is alert and oriented to person, place, and time. No cranial nerve deficit.  Skin: Skin is warm and dry. No rash noted. No erythema. No pallor.  Psychiatric: She  has a normal mood and affect. Her behavior is normal. Judgment and thought content normal.          Assessment & Plan:  1.  3 month F/U for HTN, Cholesterol, Pre-Dm, D. Deficient. Needs healthy diet, cardio QD and obtain healthy weight. Check Labs, Check BP if >130/80 call office 2. DEPRESSION/FATIGUE/ WT LOSS- check labs, increase activity and H2O, Advised counseling, Increase Zoloft to 100mg  QD. If SX increase ER 3. Right upper tooth abscess- Continue ABX AD if any SX increase ER 4. ? Hematuria vs postmenopausal bleeding- Check labs if _ needs GYN evaluation.  OVER 40 minutes of exam, counseling, chart review, referral performed

## 2013-08-16 LAB — URINALYSIS, ROUTINE W REFLEX MICROSCOPIC
Leukocytes, UA: NEGATIVE
Nitrite: NEGATIVE
Specific Gravity, Urine: 1.007 (ref 1.005–1.030)
Urobilinogen, UA: 0.2 mg/dL (ref 0.0–1.0)
pH: 6.5 (ref 5.0–8.0)

## 2013-08-16 LAB — URINALYSIS, MICROSCOPIC ONLY
Casts: NONE SEEN
Crystals: NONE SEEN
Squamous Epithelial / LPF: NONE SEEN

## 2013-08-16 LAB — URINE CULTURE: Organism ID, Bacteria: NO GROWTH

## 2013-09-04 NOTE — Progress Notes (Signed)
LMOM TO CALL & [facility]

## 2013-09-16 ENCOUNTER — Ambulatory Visit (INDEPENDENT_AMBULATORY_CARE_PROVIDER_SITE_OTHER): Payer: Medicare HMO

## 2013-09-16 DIAGNOSIS — R319 Hematuria, unspecified: Secondary | ICD-10-CM

## 2013-09-16 NOTE — Progress Notes (Signed)
Patient ID: [name] [name], female   DOB: 12-24-1945, 68 y.o.   MRN: [id number] Patient here today for follow up on abnormal urine.

## 2013-09-17 LAB — URINALYSIS, MICROSCOPIC ONLY
BACTERIA UA: NONE SEEN
CASTS: NONE SEEN
Crystals: NONE SEEN
Squamous Epithelial / LPF: NONE SEEN

## 2013-09-17 LAB — URINALYSIS, ROUTINE W REFLEX MICROSCOPIC
BILIRUBIN URINE: NEGATIVE
Glucose, UA: NEGATIVE mg/dL
KETONES UR: NEGATIVE mg/dL
Leukocytes, UA: NEGATIVE
NITRITE: NEGATIVE
PROTEIN: NEGATIVE mg/dL
SPECIFIC GRAVITY, URINE: 1.01 (ref 1.005–1.030)
UROBILINOGEN UA: 0.2 mg/dL (ref 0.0–1.0)
pH: 5.5 (ref 5.0–8.0)

## 2013-09-18 ENCOUNTER — Other Ambulatory Visit: Payer: Self-pay | Admitting: Emergency Medicine

## 2013-09-18 DIAGNOSIS — R319 Hematuria, unspecified: Secondary | ICD-10-CM

## 2013-09-18 LAB — URINE CULTURE
Colony Count: NO GROWTH
ORGANISM ID, BACTERIA: NO GROWTH

## 2013-09-19 ENCOUNTER — Telehealth: Payer: Self-pay | Admitting: Cardiovascular Disease

## 2013-09-19 NOTE — Telephone Encounter (Signed)
Received request from Nurse fax box, documents faxed for surgical clearance. To: [place] Fax number: [phone] Attention: 1.21.15/Faxed By Neomia Dear

## 2013-09-29 HISTORY — PX: ROTATOR CUFF REPAIR: SHX139

## 2013-11-13 ENCOUNTER — Encounter: Payer: Self-pay | Admitting: Physician Assistant

## 2013-11-13 ENCOUNTER — Ambulatory Visit (INDEPENDENT_AMBULATORY_CARE_PROVIDER_SITE_OTHER): Payer: Medicare HMO | Admitting: Physician Assistant

## 2013-11-13 VITALS — BP 110/60 | HR 64 | Temp 98.2°F | Resp 16 | Ht 64.0 in | Wt 121.0 lb

## 2013-11-13 DIAGNOSIS — E782 Mixed hyperlipidemia: Secondary | ICD-10-CM

## 2013-11-13 DIAGNOSIS — E7800 Pure hypercholesterolemia, unspecified: Secondary | ICD-10-CM

## 2013-11-13 DIAGNOSIS — Z1331 Encounter for screening for depression: Secondary | ICD-10-CM

## 2013-11-13 DIAGNOSIS — E559 Vitamin D deficiency, unspecified: Secondary | ICD-10-CM

## 2013-11-13 DIAGNOSIS — E2839 Other primary ovarian failure: Secondary | ICD-10-CM

## 2013-11-13 DIAGNOSIS — Z0000 Encounter for general adult medical examination without abnormal findings: Secondary | ICD-10-CM

## 2013-11-13 DIAGNOSIS — Z79899 Other long term (current) drug therapy: Secondary | ICD-10-CM

## 2013-11-13 DIAGNOSIS — R7303 Prediabetes: Secondary | ICD-10-CM

## 2013-11-13 DIAGNOSIS — I10 Essential (primary) hypertension: Secondary | ICD-10-CM

## 2013-11-13 LAB — BASIC METABOLIC PANEL WITH GFR
BUN: 10 mg/dL (ref 6–23)
CHLORIDE: 102 meq/L (ref 96–112)
CO2: 29 mEq/L (ref 19–32)
Calcium: 9.3 mg/dL (ref 8.4–10.5)
Creat: 0.76 mg/dL (ref 0.50–1.10)
GFR, EST NON AFRICAN AMERICAN: 82 mL/min
GFR, Est African American: >89 mL/min
GLUCOSE: 86 mg/dL (ref 70–99)
POTASSIUM: 4.7 meq/L (ref 3.5–5.3)
SODIUM: 139 meq/L (ref 135–145)

## 2013-11-13 LAB — CBC WITH DIFFERENTIAL/PLATELET
Basophils Absolute: 0 10*3/uL (ref 0.0–0.1)
Basophils Relative: 0 % (ref 0–1)
Eosinophils Absolute: 0.1 10*3/uL (ref 0.0–0.7)
Eosinophils Relative: 2 % (ref 0–5)
HCT: 33.1 % — ABNORMAL LOW (ref 36.0–46.0)
Hemoglobin: 11.1 g/dL — ABNORMAL LOW (ref 12.0–15.0)
LYMPHS ABS: 2.3 10*3/uL (ref 0.7–4.0)
LYMPHS PCT: 31 % (ref 12–46)
MCH: 30.9 pg (ref 26.0–34.0)
MCHC: 33.5 g/dL (ref 30.0–36.0)
MCV: 92.2 fL (ref 78.0–100.0)
Monocytes Absolute: 0.4 10*3/uL (ref 0.1–1.0)
Monocytes Relative: 5 % (ref 3–12)
NEUTROS ABS: 4.6 10*3/uL (ref 1.7–7.7)
Neutrophils Relative %: 62 % (ref 43–77)
PLATELETS: 226 10*3/uL (ref 150–400)
RBC: 3.59 MIL/uL — AB (ref 3.87–5.11)
RDW: 13.9 % (ref 11.5–15.5)
WBC: 7.4 10*3/uL (ref 4.0–10.5)

## 2013-11-13 LAB — HEPATIC FUNCTION PANEL
ALT: 10 U/L (ref 0–35)
AST: 14 U/L (ref 0–37)
Albumin: 4.3 g/dL (ref 3.5–5.2)
Alkaline Phosphatase: 47 U/L (ref 39–117)
BILIRUBIN DIRECT: 0.1 mg/dL (ref 0.0–0.3)
BILIRUBIN INDIRECT: 0.4 mg/dL (ref 0.2–1.2)
Total Bilirubin: 0.5 mg/dL (ref 0.2–1.2)
Total Protein: 6.4 g/dL (ref 6.0–8.3)

## 2013-11-13 LAB — LIPID PANEL
CHOL/HDL RATIO: 2.5 ratio
Cholesterol: 205 mg/dL — ABNORMAL HIGH (ref 0–200)
HDL: 82 mg/dL (ref >39)
LDL Cholesterol: 107 mg/dL — ABNORMAL HIGH (ref 0–99)
TRIGLYCERIDES: 78 mg/dL (ref <150)
VLDL: 16 mg/dL (ref 0–40)

## 2013-11-13 LAB — HEMOGLOBIN A1C
Hgb A1c MFr Bld: 5.6 % (ref <5.7)
Mean Plasma Glucose: 114 mg/dL (ref <117)

## 2013-11-13 LAB — MAGNESIUM: Magnesium: 2 mg/dL (ref 1.5–2.5)

## 2013-11-13 NOTE — Progress Notes (Signed)
Subjective:   [name] [name] is a 68 y.o. female who presents for Medicare Annual Wellness Visit and 3 month follow up on hypertension, prediabetes, hyperlipidemia, vitamin D def.  Date of last medicare wellness visit is unknown.   Her blood pressure has been controlled at home, today their BP is BP: 110/60 mmHg She does workout, not as much since RTC surgery. She denies chest pain, shortness of breath, dizziness.  She is on cholesterol medication and denies myalgias. Her cholesterol is at goal. The cholesterol last visit was:   Lab Results  Component Value Date   CHOL 193 08/15/2013   HDL 89 08/15/2013   LDLCALC 87 08/15/2013   TRIG 87 08/15/2013   CHOLHDL 2.2 08/15/2013   She has been working on diet and exercise for prediabetes, and denies paresthesia of the feet, polydipsia, polyuria and visual disturbances. Last A1C in the office was:  Lab Results  Component Value Date   HGBA1C 5.6 08/15/2013   Patient is on Vitamin D supplement.  Names of Other Physician/Practitioners you currently use: 1. [facility]- here for primary care 2. Dr. Sabra Heck, eye doctor, last visit Feb 2015 3. Dr. Yaakov Guthrie, dentist, visit q 4 months 4. Dr. Risa Grill 5. Dr. Phineas Real 6. Dr. Argentina Ponder 7. Dr. Ernesto Rutherford 8. Dr. Johnsie Cancel Patient Care Team: Unk Pinto, MD as PCP - General (Internal Medicine)  Medication Review Current Outpatient Prescriptions on File Prior to Visit  Medication Sig Dispense Refill  . amoxicillin (AMOXIL) 500 MG capsule Take 1,000 mg by mouth 2 (two) times daily.      . B Complex Vitamins (B COMPLEX PO) Take 1 tablet by mouth daily.       . Cholecalciferol (VITAMIN D3) 5000 UNITS TABS Take 1 tablet by mouth daily.        Marland Kitchen LORazepam (ATIVAN) 1 MG tablet Take 1 mg by mouth 2 (two) times daily.       Marland Kitchen MAGNESIUM PO Take 1 tablet by mouth daily.       . metoprolol tartrate (LOPRESSOR) 25 MG tablet TAKE ONE TABLET BY MOUTH TWICE A DAY  60 tablet   5  . pravastatin (PRAVACHOL) 20 MG tablet TAKE 1 TABLET BY MOUTH DAILY  90 tablet  3  . sertraline (ZOLOFT) 50 MG tablet Take 1 tablet (50 mg total) by mouth daily.  30 tablet  12   No current facility-administered medications on file prior to visit.    Current Problems (verified) Patient Active Problem List   Diagnosis Date Noted  . Hypertension   . Prediabetes   . Vitamin D deficiency   . Elevated cholesterol   . Fibroid   . MIXED HYPERLIPIDEMIA 06/18/2009  . SMOKER 06/16/2009  . MITRAL VALVE PROLAPSE 06/16/2009  . PALPITATIONS 06/16/2009  . DIVERTICULITIS-COLON 05/06/2008  . HEMORRHOIDS, INTERNAL 10/10/2006  . DIVERTICULOSIS, COLON 10/10/2006  . HEMORRHOIDS, EXTERNAL 05/20/2002    Screening Tests Health Maintenance  Topic Date Due  . Colonoscopy  07/19/1997  . Zostavax  07/20/2007  . Pneumococcal Polysaccharide Vaccine Age 71 And Over  07/19/2012  . Influenza Vaccine  03/29/2013  . Tetanus/tdap  12/15/2013  . Pap Smear  12/19/2013  . Mammogram  01/30/2015     Immunization History  Administered Date(s) Administered  . Pneumococcal Polysaccharide-23 12/22/2004  . Td 12/16/2003    Preventative care: Last colonoscopy: 2009 Last mammogram: 01/29/2013 Last pap smear/pelvic exam: 11/2012  DEXA: at OB/GYN- 2010- normal- due this year  Prior vaccinations: TD or Tdap:  2005- ? If due this year  Influenza: declines Pneumococcal: 2006 Shingles/Zostavax: declines  History reviewed: allergies, current medications, past family history, past medical history, past social history, past surgical history and problem list  Risk Factors: Osteoporosis: postmenopausal estrogen deficiency, dietary calcium and/or vitamin D deficiency and amenorrhea History of fracture in the past year: no  Tobacco History  Substance Use Topics  . Smoking status: Former Smoker    Types: Cigarettes    Quit date: 05/16/2013  . Smokeless tobacco: Not on file  . Alcohol Use: 0.5 oz/week    1  drink(s) per week   She does not smoke.  Patient is a former smoker. Are there smokers in your home (other than you)?  No  Alcohol Current alcohol use: none  Caffeine Current caffeine use: denies use  Exercise Exercise limitations: The patient is experiencing exercise intolerance (resist surgery). Current exercise: walking  Nutrition/Diet Current diet: in general, a "healthy" diet    Cardiac risk factors: advanced age (older than 1455 for men, 9165 for women), dyslipidemia, hypertension, sedentary lifestyle and smoking/ tobacco exposure.  Depression Screen Nurse depression screen reviewed.  (Note: if answer to either of the following is "Yes", a more complete depression screening is indicated)   Q1: Over the past two weeks, have you felt down, depressed or hopeless? Yes  Q2: Over the past two weeks, have you felt little interest or pleasure in doing things? Yes  Have you lost interest or pleasure in daily life? No  Do you often feel hopeless? No  Do you cry easily over simple problems? No  Activities of Daily Living Nurse ADLs screen reviewed.  In your present state of health, do you have any difficulty performing the following activities?:  Driving? No Managing money?  No Feeding yourself? No Getting from bed to chair? No Climbing a flight of stairs? No Preparing food and eating?: No Bathing or showering? No Getting dressed: No Getting to the toilet? No Using the toilet:No Moving around from place to place: No In the past year have you fallen or had a near fall?:No   Are you sexually active?  No- husband with MS  Do you have more than one partner?  No  Vision Difficulties: No  Hearing Difficulties: No Do you often ask people to speak up or repeat themselves? No Do you experience ringing or noises in your ears? No Do you have difficulty understanding soft or whispered voices? No  Cognition  Do you feel that you have a problem with memory?Yes  Do you often  misplace items? No  Do you feel safe at home?  Yes  Advanced directives Does patient have a Health Care Power of Attorney? Yes Does patient have a Living Will? Yes    Objective:     Vision and hearing screens reviewed.   Blood pressure 110/60, pulse 64, temperature 98.2 F (36.8 C), resp. rate 16, height 5\' 4"  (1.626 m), weight 121 lb (54.885 kg). Body mass index is 20.76 kg/(m^2).  General appearance: alert, no distress, WD/WN,  female Cognitive Testing  Alert? Yes  Normal Appearance?Yes  Oriented to person? Yes  Place? Yes   Time? Yes  Recall of three objects?  No 2/3  Can perform simple calculations? Yes  Displays appropriate judgment?Yes  Can read the correct time from a watch face?Yes  HEENT: normocephalic, sclerae anicteric, TMs pearly, nares patent, no discharge or erythema, pharynx normal Oral cavity: MMM, no lesions Neck: supple, no lymphadenopathy, no thyromegaly, no masses Heart: RRR,  normal S1, S2, no murmurs Lungs: CTA bilaterally, no wheezes, rhonchi, or rales Abdomen: +bs, soft, non tender, non distended, no masses, no hepatomegaly, no splenomegaly Musculoskeletal: nontender, no swelling, no obvious deformity, right arm in sling from surgery Extremities: no edema, no cyanosis, no clubbing Pulses: 2+ symmetric, upper and lower extremities, normal cap refill Neurological: alert, oriented x 3, CN2-12 intact, strength normal upper extremities and lower extremities, sensation normal throughout, DTRs 2+ throughout, no cerebellar signs, gait normal Psychiatric: normal affect, behavior normal, pleasant  Breast: defer UKG:[id number] Rectal: defer   Assessment:   1. Hypertension - CBC with Differential - BASIC METABOLIC PANEL WITH GFR - Hepatic function panel - TSH  2. Prediabetes - Hemoglobin A1c  3. Mixed hyperlipidemia - Lipid panel  4. Vitamin D deficiency - Vit D  25 hydroxy (rtn osteoporosis monitoring)  5. Encounter for long-term (current) use of  other medications - Magnesium  7. Estrogen deficiency - DG Bone Density; Future   Plan:   During the course of the visit the patient was educated and counseled about appropriate screening and preventive services including:    Influenza vaccine  Td vaccine  Screening electrocardiogram  Screening mammography  Bone densitometry screening  Colorectal cancer screening  Diabetes screening  Glaucoma screening  Nutrition counseling   Screening recommendations, referrals:  Vaccinations: Tdap vaccine yes  Influenza vaccine no Pneumococcal vaccine no Shingles vaccine not done Hep B vaccine not done  Nutrition assessed and recommended  Colonoscopy yes Mammogram yes Pap smear not done Pelvic exam not done Recommended yearly ophthalmology/optometry visit for glaucoma screening and checkup Recommended yearly dental visit for hygiene and checkup Advanced directives - no, patient has done  Conditions/risks identified: BMI: Discussed weight loss, diet, and increase physical activity.  Increase physical activity: AHA recommends 150 minutes of physical activity a week.  Medications reviewed DEXA- ordered Diabetes is at goal, ACE/ARB therapy: No, Reason not on Ace Inhibitor/ARB therapy:  No DM anymore Urinary Incontinence is not an issue: discussed non pharmacology and pharmacology options.  Fall risk: low- discussed PT, home fall assessment, medications.   Medicare Attestation I have personally reviewed: The patient's medical and social history Their use of alcohol, tobacco or illicit drugs Their current medications and supplements The patient's functional ability including ADLs,fall risks, home safety risks, cognitive, and hearing and visual impairment Diet and physical activities Evidence for depression or mood disorders  The patient's weight, height, BMI, and visual acuity have been recorded in the chart.  I have made referrals, counseling, and provided education to  the patient based on review of the above and I have provided the patient with a written personalized care plan for preventive services.     Vicie Mutters, PA-C   11/13/2013

## 2013-11-13 NOTE — Patient Instructions (Signed)

## 2013-11-14 LAB — TSH: TSH: 1.211 u[IU]/mL (ref 0.350–4.500)

## 2013-11-14 LAB — VITAMIN D 25 HYDROXY (VIT D DEFICIENCY, FRACTURES): Vit D, 25-Hydroxy: 76 ng/mL (ref 30–89)

## 2013-11-19 ENCOUNTER — Other Ambulatory Visit: Payer: Self-pay | Admitting: Cardiovascular Disease

## 2013-12-13 ENCOUNTER — Other Ambulatory Visit: Payer: Self-pay | Admitting: *Deleted

## 2013-12-13 MED ORDER — LORAZEPAM 1 MG PO TABS: 1.0000 mg | ORAL_TABLET | Freq: Two times a day (BID) | ORAL | Status: DC

## 2014-02-12 ENCOUNTER — Ambulatory Visit: Payer: Self-pay | Admitting: Internal Medicine

## 2014-02-18 ENCOUNTER — Encounter: Payer: Self-pay | Admitting: Emergency Medicine

## 2014-02-18 ENCOUNTER — Ambulatory Visit: Payer: Medicare HMO | Admitting: Emergency Medicine

## 2014-02-18 ENCOUNTER — Other Ambulatory Visit: Payer: Self-pay | Admitting: Emergency Medicine

## 2014-02-18 VITALS — BP 104/60 | HR 58 | Temp 98.2°F | Resp 18 | Ht 63.5 in | Wt 124.0 lb

## 2014-02-18 DIAGNOSIS — Z1212 Encounter for screening for malignant neoplasm of rectum: Secondary | ICD-10-CM

## 2014-02-18 DIAGNOSIS — R7309 Other abnormal glucose: Secondary | ICD-10-CM

## 2014-02-18 DIAGNOSIS — Z119 Encounter for screening for infectious and parasitic diseases, unspecified: Secondary | ICD-10-CM

## 2014-02-18 DIAGNOSIS — R5383 Other fatigue: Secondary | ICD-10-CM

## 2014-02-18 DIAGNOSIS — IMO0001 Reserved for inherently not codable concepts without codable children: Secondary | ICD-10-CM

## 2014-02-18 DIAGNOSIS — D649 Anemia, unspecified: Secondary | ICD-10-CM

## 2014-02-18 DIAGNOSIS — E538 Deficiency of other specified B group vitamins: Secondary | ICD-10-CM

## 2014-02-18 DIAGNOSIS — E782 Mixed hyperlipidemia: Secondary | ICD-10-CM

## 2014-02-18 DIAGNOSIS — E559 Vitamin D deficiency, unspecified: Secondary | ICD-10-CM

## 2014-02-18 DIAGNOSIS — R200 Anesthesia of skin: Secondary | ICD-10-CM

## 2014-02-18 DIAGNOSIS — I10 Essential (primary) hypertension: Secondary | ICD-10-CM

## 2014-02-18 DIAGNOSIS — Z0000 Encounter for general adult medical examination without abnormal findings: Secondary | ICD-10-CM

## 2014-02-18 DIAGNOSIS — R5381 Other malaise: Secondary | ICD-10-CM

## 2014-02-18 LAB — CBC WITH DIFFERENTIAL/PLATELET
Basophils Absolute: 0 10*3/uL (ref 0.0–0.1)
Basophils Relative: 0 % (ref 0–1)
EOS ABS: 0.1 10*3/uL (ref 0.0–0.7)
Eosinophils Relative: 2 % (ref 0–5)
HEMATOCRIT: 34.4 % — AB (ref 36.0–46.0)
HEMOGLOBIN: 11.8 g/dL — AB (ref 12.0–15.0)
Lymphocytes Relative: 37 % (ref 12–46)
Lymphs Abs: 2.3 10*3/uL (ref 0.7–4.0)
MCH: 31.3 pg (ref 26.0–34.0)
MCHC: 34.3 g/dL (ref 30.0–36.0)
MCV: 91.2 fL (ref 78.0–100.0)
MONO ABS: 0.4 10*3/uL (ref 0.1–1.0)
MONOS PCT: 6 % (ref 3–12)
NEUTROS PCT: 55 % (ref 43–77)
Neutro Abs: 3.4 10*3/uL (ref 1.7–7.7)
Platelets: 255 10*3/uL (ref 150–400)
RBC: 3.77 MIL/uL — ABNORMAL LOW (ref 3.87–5.11)
RDW: 14.4 % (ref 11.5–15.5)
WBC: 6.2 10*3/uL (ref 4.0–10.5)

## 2014-02-18 LAB — HEMOGLOBIN A1C
HEMOGLOBIN A1C: 5.8 % — AB (ref <5.7)
Mean Plasma Glucose: 120 mg/dL — ABNORMAL HIGH (ref <117)

## 2014-02-18 NOTE — Progress Notes (Signed)
Subjective:    Patient ID: [name] [name], female    DOB: 1946-06-08, 68 y.o.   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 Deficient. She notes BP good at home. She has been eating healthy. She has been trying to keep busy with cardio and garden but notes difficult with pain. She has chronic back/ neck pain and denies new injury. She did have RTC Right repaired with complications earlier this year. She now has decreased ROM/ Strength and increased numbness of right hand/ shoulder. She has upcoming EMG to evaluate. She has also notes mild toe numbness on/off.   She has noticed increased fatigue with heat but also concerned with multiple tick bites this year. She denies current rash or other neuro changes.  WBC             7.4   11/13/2013 HGB            11.1   11/13/2013 HCT            33.1   11/13/2013 PLT             226   11/13/2013 GLUCOSE          86   11/13/2013 CHOL            205   11/13/2013 TRIG             78   11/13/2013 HDL              82   11/13/2013 LDLCALC         107   11/13/2013 ALT              10   11/13/2013 AST              14   11/13/2013 NA              139   11/13/2013 K               4.7   11/13/2013 CL              102   11/13/2013 CREATININE     0.76   11/13/2013 BUN              10   11/13/2013 CO2              29   11/13/2013 TSH           1.211   11/13/2013 HGBA1C          5.6   11/13/2013   Hyperlipidemia  Hypertension     Medication List       This list is accurate as of: 02/18/14  2:45 PM.  Always use your most recent med list.               B COMPLEX PO  Take 1 tablet by mouth daily.     LORazepam 1 MG tablet  Commonly known as:  ATIVAN  Take 1 mg by mouth at bedtime. Take 2 tabs at QHS     MAGNESIUM PO  Take 1 tablet by mouth daily.     metoprolol tartrate 25 MG tablet  Commonly known as:  LOPRESSOR  TAKE ONE TABLET BY MOUTH TWICE A DAY     pravastatin 20 MG tablet   Commonly known as:  PRAVACHOL  TAKE 1 TABLET BY MOUTH DAILY  sertraline 50 MG tablet  Commonly known as:  ZOLOFT  Take 1 tablet (50 mg total) by mouth daily.     Vitamin D3 5000 UNITS Tabs  Take 1 tablet by mouth daily.       Allergies  Allergen Reactions  . Ketek [Telithromycin]    Past Medical History  Diagnosis Date  . Palpitations   . Mitral valve disorders   . Diverticulitis of colon (without mention of hemorrhage)   . Internal hemorrhoids without mention of complication   . Diverticulosis of colon (without mention of hemorrhage)   . Cervical dysplasia 1976  . Elevated cholesterol   . MRSA infection   . Pelvic adhesive disease   . Hypertension   . Prediabetes   . Vitamin D deficiency    Past Surgical History  Procedure Laterality Date  . Tympanoplasty    . Rotator cuff repair      LEFT SHOULDER  . Ectopic pregnancy surgery  1971, 1979  . Hemorrhoid surgery    . Oophorectomy    . Gynecologic cryosurgery  1976  . Abdominal hysterectomy  2000    TAH,BSO endometriosis, leiomyomata  . Rotator cuff repair Right 2/15    History  Substance Use Topics  . Smoking status: Former Smoker    Types: Cigarettes    Quit date: 05/16/2013  . Smokeless tobacco: Not on file  . Alcohol Use: 0.5 oz/week    1 drink(s) per week   Family History  Problem Relation Age of Onset  . Diabetes Mother   . Hypertension Mother   . Cancer Mother     melanoma  . Diabetes Father   . Heart disease Father   . Hyperlipidemia Father   . Hypertension Father   . Cancer Sister     UTERINE  . Cancer Maternal Grandmother     COLON  . Heart disease Maternal Grandfather   . Heart disease Paternal Grandmother   . Heart disease Paternal Grandfather    MAINTENANCE: Colonoscopy: 2009, 2011 Mammo:01/28/13 BMD:2010 Pap/ Pelvic:12/05/12 [id number] WNL Dentist:q 6 month  IMMUNIZATIONS: Td:2005 Pneumovax: 2006 Zostavax: Influenza:   Patient Care Team: Unk Pinto, MD as PCP -  General (Internal Medicine) Marin Shutter, MD as Consulting Physician (Orthopedic Surgery) Linna Hoff, MD as Consulting Physician (Orthopedic Surgery) Thornell Sartorius, MD as Consulting Physician (Otolaryngology) Barbaraann Cao, OD as Referring Physician (Optometry) Josue Hector, MD as Consulting Physician (Cardiology) Bernestine Amass, MD as Consulting Physician (Urology) Anastasio Auerbach, MD as Consulting Physician (Gynecology) Devra Dopp, MD as Referring Physician (Dermatology)     Review of Systems BP 104/60  Pulse 58  Temp(Src) 98.2 F (36.8 C)  Resp 18  Ht 5' 3.5" (1.613 m)  Wt 124 lb (56.246 kg)  BMI 21.62 kg/m2     Objective:   Physical Exam  Nursing note and vitals reviewed. Constitutional: She is oriented to person, place, and time. She appears well-developed and well-nourished. No distress.  HENT:  Head: Normocephalic and atraumatic.  Right Ear: External ear normal.  Left Ear: External ear normal.  Nose: Nose normal.  Mouth/Throat: Oropharynx is clear and moist.  Eyes: Conjunctivae and EOM are normal. Pupils are equal, round, and reactive to light. Right eye exhibits no discharge. Left eye exhibits no discharge. No scleral icterus.  Neck: Normal range of motion. Neck supple. No JVD present. No tracheal deviation present. No thyromegaly present.  Cardiovascular: Normal rate, regular rhythm, normal heart sounds and intact distal pulses.  Pulmonary/Chest: Effort normal and breath sounds normal.  Abdominal: Soft. Bowel sounds are normal. She exhibits no distension and no mass. There is no tenderness. There is no rebound and no guarding.  Genitourinary:  Breasts WNL, Def GYn pap 2016  Musculoskeletal: Normal range of motion. She exhibits no edema and no tenderness.  Decreased Right shoulder ROM, R>L hand weakness.   Lymphadenopathy:    She has no cervical adenopathy.  Neurological: She is alert and oriented to person, place, and time. She  has normal reflexes. No cranial nerve deficit. She exhibits normal muscle tone. Coordination normal.  Skin: Skin is warm and dry. No rash noted. No erythema. No pallor.  Psychiatric: She has a normal mood and affect. Her behavior is normal. Judgment and thought content normal.          Assessment & Plan:  1. CPE- Update screening labs/ History/ Immunizations/ Testing as needed. Advised healthy diet, QD exercise, increase H20 and continue RX/ Vitamins AD.  2. Hand numbness/ weakness- keep Ortho f/u for EMG, check labs  3. Neck/ back pain chronic now with numbness? Toes/ hands- check labs , re-eval ortho  4. 3 month F/U for HTN, Cholesterol, Pre-Dm, D. Deficient. Needs healthy diet, cardio QD and obtain healthy weight. Check Labs, Check BP if >130/80 call office  5. Fatigue vs ? Tick bite- check labs, increase activity and H2O

## 2014-02-18 NOTE — Patient Instructions (Signed)
Lyme Disease You may have been bitten by a tick and are to watch for the development of Lyme Disease. Lyme Disease is an infection that is caused by a bacteria The bacteria causing this disease is named Borreilia burgdorferi. If a tick is infected with this bacteria and then bites you, then Lyme Disease may occur. These ticks are carried by deer and rodents such as rabbits and mice and infest grassy as well as forested areas. Fortunately most tick bites do not cause Lyme Disease.  Lyme Disease is easier to prevent than to treat. First, covering your legs with clothing when walking in areas where ticks are possibly abundant will prevent their attachment because ticks tend to stay within inches of the ground. Second, using insecticides containing DEET can be applied on skin or clothing. Last, because it takes about 12 to 24 hours for the tick to transmit the disease after attachment to the human host, you should inspect your body for ticks twice a day when you are in areas where Lyme Disease is common. You must look thoroughly when searching for ticks. The Ixodes tick that carries Lyme Disease is very small. It is around the size of a sesame seed (picture of tick is not actual size). Removal is best done by grasping the tick by the head and pulling it out. Do not to squeeze the body of the tick. This could inject the infecting bacteria into the bite site. Wash the area of the bite with an antiseptic solution after removal.  Lyme Disease is a disease that may affect many body systems. Because of the small size of the biting tick, most people do not notice being bitten. The first sign of an infection is usually a round red rash that extends out from the center of the tick bite. The center of the lesion may be blood colored (hemorrhagic) or have tiny blisters (vesicular). Most lesions have bright red outer borders and partial central clearing. This rash may extend out many inches in diameter, and multiple lesions may  be present. Other symptoms such as fatigue, headaches, chills and fever, general achiness and swelling of lymph glands may also occur. If this first stage of the disease is left untreated, these symptoms may gradually resolve by themselves, or progressive symptoms may occur because of spread of infection to other areas of the body.  Follow up with your caregiver to have testing and treatment if you have a tick bite and you develop any of the above complaints. Your caregiver may recommend preventative (prophylactic) medications which kill bacteria (antibiotics). Once a diagnosis of Lyme Disease is made, antibiotic treatment is highly likely to cure the disease. Effective treatment of late stage Lyme Disease may require longer courses of antibiotic therapy.  MAKE SURE YOU:   Understand these instructions.  Will watch your condition.  Will get help right away if you are not doing well or get worse. Document Released: 11/21/2000 Document Revised: 11/07/2011 Document Reviewed: 01/23/2009 Exit[employer] Patient Information 2015 [employer]. This information is not intended to replace advice given to you by your health care provider. Make sure you discuss any questions you have with your health care provider.  

## 2014-02-19 ENCOUNTER — Encounter: Payer: Self-pay | Admitting: Emergency Medicine

## 2014-02-19 LAB — URINALYSIS, ROUTINE W REFLEX MICROSCOPIC
BILIRUBIN URINE: NEGATIVE
GLUCOSE, UA: NEGATIVE mg/dL
KETONES UR: NEGATIVE mg/dL
Nitrite: NEGATIVE
PH: 5.5 (ref 5.0–8.0)
PROTEIN: NEGATIVE mg/dL
Specific Gravity, Urine: 1.02 (ref 1.005–1.030)
Urobilinogen, UA: 0.2 mg/dL (ref 0.0–1.0)

## 2014-02-19 LAB — RPR

## 2014-02-19 LAB — IRON AND TIBC
%SAT: 29 % (ref 20–55)
Iron: 101 ug/dL (ref 42–145)
TIBC: 352 ug/dL (ref 250–470)
UIBC: 251 ug/dL (ref 125–400)

## 2014-02-19 LAB — BASIC METABOLIC PANEL WITH GFR
BUN: 11 mg/dL (ref 6–23)
CALCIUM: 9.4 mg/dL (ref 8.4–10.5)
CO2: 25 mEq/L (ref 19–32)
CREATININE: 0.74 mg/dL (ref 0.50–1.10)
Chloride: 103 mEq/L (ref 96–112)
GFR, Est Non African American: 85 mL/min
Glucose, Bld: 83 mg/dL (ref 70–99)
Potassium: 4.6 mEq/L (ref 3.5–5.3)
SODIUM: 138 meq/L (ref 135–145)

## 2014-02-19 LAB — HEPATIC FUNCTION PANEL
ALBUMIN: 4.4 g/dL (ref 3.5–5.2)
ALK PHOS: 49 U/L (ref 39–117)
ALT: <8 U/L (ref 0–35)
AST: 13 U/L (ref 0–37)
BILIRUBIN INDIRECT: 0.3 mg/dL (ref 0.2–1.2)
Bilirubin, Direct: 0.1 mg/dL (ref 0.0–0.3)
Total Bilirubin: 0.4 mg/dL (ref 0.2–1.2)
Total Protein: 6.5 g/dL (ref 6.0–8.3)

## 2014-02-19 LAB — URINALYSIS, MICROSCOPIC ONLY
BACTERIA UA: NONE SEEN
CRYSTALS: NONE SEEN
Casts: NONE SEEN
Squamous Epithelial / LPF: NONE SEEN

## 2014-02-19 LAB — ROCKY MTN SPOTTED FVR ABS PNL(IGG+IGM)
RMSF IGM: 0.03 IV
RMSF IgG: 0.33 IV

## 2014-02-19 LAB — MICROALBUMIN / CREATININE URINE RATIO
CREATININE, URINE: 170.5 mg/dL
MICROALB/CREAT RATIO: 3.6 mg/g (ref 0.0–30.0)
Microalb, Ur: 0.61 mg/dL (ref 0.00–1.89)

## 2014-02-19 LAB — LIPID PANEL
Cholesterol: 219 mg/dL — ABNORMAL HIGH (ref 0–200)
HDL: 84 mg/dL (ref >39)
LDL CALC: 114 mg/dL — AB (ref 0–99)
Total CHOL/HDL Ratio: 2.6 Ratio
Triglycerides: 107 mg/dL (ref <150)
VLDL: 21 mg/dL (ref 0–40)

## 2014-02-19 LAB — MAGNESIUM: Magnesium: 2 mg/dL (ref 1.5–2.5)

## 2014-02-19 LAB — INSULIN, FASTING: Insulin fasting, serum: 5 u[IU]/mL (ref 3–28)

## 2014-02-19 LAB — TSH: TSH: 1.293 u[IU]/mL (ref 0.350–4.500)

## 2014-02-19 LAB — VITAMIN D 25 HYDROXY (VIT D DEFICIENCY, FRACTURES): Vit D, 25-Hydroxy: 79 ng/mL (ref 30–89)

## 2014-02-19 LAB — VITAMIN B12: Vitamin B-12: 1744 pg/mL — ABNORMAL HIGH (ref 211–911)

## 2014-02-19 LAB — FOLATE RBC: RBC Folate: 298 ng/mL (ref >280)

## 2014-02-20 LAB — LYME ABY, WSTRN BLT IGG & IGM W/BANDS
B burgdorferi IgG Abs (IB): NEGATIVE
B burgdorferi IgM Abs (IB): NEGATIVE
LYME DISEASE 23 KD IGG: NONREACTIVE
LYME DISEASE 39 KD IGG: NONREACTIVE
LYME DISEASE 39 KD IGM: NONREACTIVE
LYME DISEASE 41 KD IGG: NONREACTIVE
LYME DISEASE 45 KD IGG: NONREACTIVE
LYME DISEASE 93 KD IGG: NONREACTIVE
Lyme Disease 18 kD IgG: NONREACTIVE
Lyme Disease 23 kD IgM: NONREACTIVE
Lyme Disease 28 kD IgG: NONREACTIVE
Lyme Disease 30 kD IgG: NONREACTIVE
Lyme Disease 41 kD IgM: NONREACTIVE
Lyme Disease 58 kD IgG: NONREACTIVE
Lyme Disease 66 kD IgG: NONREACTIVE

## 2014-02-20 LAB — HEAVY METALS, BLOOD
Lead: <2 ug/dL (ref <10)
Mercury, B: <4 mcg/L (ref <=10)

## 2014-02-20 LAB — URINE CULTURE
COLONY COUNT: NO GROWTH
ORGANISM ID, BACTERIA: NO GROWTH

## 2014-02-25 ENCOUNTER — Other Ambulatory Visit: Payer: Self-pay | Admitting: *Deleted

## 2014-02-25 MED ORDER — LORAZEPAM 1 MG PO TABS: 1.0000 mg | ORAL_TABLET | Freq: Every day | ORAL | Status: DC

## 2014-03-04 ENCOUNTER — Other Ambulatory Visit: Payer: Self-pay | Admitting: Emergency Medicine

## 2014-03-04 NOTE — Addendum Note (Signed)
Addended by: Kelby Aline R on: 03/04/2014 11:08 AM   Modules accepted: Orders

## 2014-03-10 ENCOUNTER — Telehealth: Payer: Self-pay

## 2014-03-10 NOTE — Telephone Encounter (Signed)
Patient has appt today with Dr. Onnie Graham and needs records faxed. Records were faxed [phone]

## 2014-04-11 ENCOUNTER — Ambulatory Visit (INDEPENDENT_AMBULATORY_CARE_PROVIDER_SITE_OTHER): Payer: Medicare HMO | Admitting: Cardiovascular Disease

## 2014-04-11 VITALS — BP 124/70 | HR 60 | Ht 69.0 in | Wt 125.0 lb

## 2014-04-11 DIAGNOSIS — R29898 Other symptoms and signs involving the musculoskeletal system: Secondary | ICD-10-CM

## 2014-04-11 DIAGNOSIS — M6281 Muscle weakness (generalized): Secondary | ICD-10-CM

## 2014-04-11 DIAGNOSIS — I10 Essential (primary) hypertension: Secondary | ICD-10-CM

## 2014-04-11 DIAGNOSIS — I059 Rheumatic mitral valve disease, unspecified: Secondary | ICD-10-CM

## 2014-04-11 DIAGNOSIS — F17200 Nicotine dependence, unspecified, uncomplicated: Secondary | ICD-10-CM

## 2014-04-11 NOTE — Assessment & Plan Note (Signed)
Well controlled.  Continue current medications and low sodium Dash type diet.    

## 2014-04-11 NOTE — Progress Notes (Signed)
Patient ID: [name] [name], female   DOB: 09/19/1945, 68 y.o.   MRN: [id number] [name] [name] is seen today for F/U of palpitatiions, PAC's and smoking She denies SSCP, dyspnea, palpitations or syncope. She has no known structural heart disease. She has stopped smoking since April. I congratulated her on this. She used Chantix CXR last year ok . No new issues seeing Dr Ernestine Conrad now   Still not smoking since 4/14  Had right shoulder surgery for torn tendon  Seeing Dr Onnie Graham  Injured right wrist with neuropathy during PT in March Now complaining of bilateral and weakness    ROS: Denies fever, malais, weight loss, blurry vision, decreased visual acuity, cough, sputum, SOB, hemoptysis, pleuritic pain, palpitaitons, heartburn, abdominal pain, melena, lower extremity edema, claudication, or rash.  All other systems reviewed and negative  General: Affect appropriate Healthy:  appears stated age 45: normal Neck supple with no adenopathy JVP normal no bruits no thyromegaly Lungs clear with no wheezing and good diaphragmatic motion Heart:  S1/S2 no murmur, no rub, gallop or click PMI normal Abdomen: benighn, BS positve, no tenderness, no AAA no bruit.  No HSM or HJR Distal pulses intact with no bruits No edema Neuro non-focal Skin warm and dry Weakness with poor hand grip bilaterally and ? Thenar atrophy   Current Outpatient Prescriptions  Medication Sig Dispense Refill  . B Complex Vitamins (B COMPLEX PO) Take 1 tablet by mouth daily.       . Cholecalciferol (VITAMIN D3) 5000 UNITS TABS Take 1 tablet by mouth daily.        Marland Kitchen LORazepam (ATIVAN) 1 MG tablet Take 1 tablet (1 mg total) by mouth at bedtime. Take 2 tabs at QHS  60 tablet  5  . MAGNESIUM PO Take 1 tablet by mouth daily.       . metoprolol tartrate (LOPRESSOR) 25 MG tablet TAKE ONE TABLET BY MOUTH TWICE A DAY  60 tablet  2  . pravastatin (PRAVACHOL) 20 MG tablet TAKE 1 TABLET BY MOUTH DAILY  90 tablet  3  . sertraline (ZOLOFT)  50 MG tablet Take 1 tablet (50 mg total) by mouth daily.  30 tablet  12   No current facility-administered medications for this visit.    Allergies  Ketek  Electrocardiogram:  SR rate 55 normal   Assessment and Plan

## 2014-04-11 NOTE — Patient Instructions (Signed)
Your physician wants you to follow-up in:   [place] will receive a reminder letter in the mail two months in advance. If you don't receive a letter, please call our office to schedule the follow-up appointment. Your physician recommends that you continue on your current medications as directed. Please refer to the Current Medication list given to you today.  You have been referred to DR  TAT  [facility]

## 2014-04-11 NOTE — Assessment & Plan Note (Signed)
She indicates progression since March  No cervical spine w/u by ortho  Objective finidings on exam  F/U Dr Tat  Neuro for further w/u r/o c spine disease RSD, early myopathy

## 2014-04-11 NOTE — Assessment & Plan Note (Signed)
Cholesterol is at goal.  Continue current dose of statin and diet Rx.  No myalgias or side effects.  F/U  LFT's in 6 months. Lab Results  Component Value Date   LDLCALC 114* 02/18/2014

## 2014-04-11 NOTE — Assessment & Plan Note (Signed)
Benign no murmur on exam  No need for SBE

## 2014-05-21 ENCOUNTER — Ambulatory Visit (INDEPENDENT_AMBULATORY_CARE_PROVIDER_SITE_OTHER): Payer: Medicare HMO | Admitting: Neurology

## 2014-05-21 ENCOUNTER — Encounter: Payer: Self-pay | Admitting: Neurology

## 2014-05-21 VITALS — BP 100/68 | HR 47 | Ht 64.0 in | Wt 127.2 lb

## 2014-05-21 DIAGNOSIS — M6281 Muscle weakness (generalized): Secondary | ICD-10-CM

## 2014-05-21 DIAGNOSIS — M542 Cervicalgia: Secondary | ICD-10-CM

## 2014-05-21 DIAGNOSIS — R209 Unspecified disturbances of skin sensation: Secondary | ICD-10-CM

## 2014-05-21 DIAGNOSIS — R29898 Other symptoms and signs involving the musculoskeletal system: Secondary | ICD-10-CM

## 2014-05-21 LAB — CK: CK TOTAL: 92 U/L (ref 7–177)

## 2014-05-21 NOTE — Progress Notes (Signed)
[facility] Clinic Note - Initial Visit   Date: 05/21/2014  [name] MRN: [id number] DOB: [birth year]   Dear Dr. Johnsie Cancel:  Thank you for your kind referral of [name] [name] for consultation of right hand weakness. Although her history is well known to you, please allow [name] to reiterate it for the purpose of our medical record. The patient was accompanied to the clinic by self.    History of Present Illness: [name] [name] is a 68 y.o. right-handed Caucasian female with history of hypertension, hyperlipidemia, tachycardia, right CTS, and depression presenting for evaluation of right hand weakness.    She underwent right rotator cuff surgery February 17th 2015.  She was getting physical therapy afterwards and and at one of her sessions in March she was stretching her arms back and developed severe sharp pain over the right upper arm (biceps region).  The pain quickly improved after her arm was rested again, but the following day, she developed numbness of her entire right hand.  The same day she also noticed that she was unable to extend her fingers or completely make a fist.  Her numbness has been persistent since onset. No associated tingling.  She has noticed that warm water helps her symptoms.  Pain is worse when she is using her hands a lot.    She was referred to Dr. Caralyn Guile, hand specialist, who ordered EMG which showed mild right CTS.  There was also mention of bilateral hand atrophy for which she has been referred to neurology.  She endorses chronic neck pain.  No muscle twitches.  She is not dropping things and is still able to carry out her usual activities.  Her husband has multiple sclerosis and stays very active with him.  Out-side paper records, electronic medical record, and images have been reviewed where available and summarized as:  EMG 02/24/2014:  Bilateral median motor responses are reduced in amplitude, L2.7, R4.2 mV.  Right  median sensory response is prolonged 3.53ms. Needle electrode examination was normal. Findings are consistent with right CTS, per report.  Labs 02/23/2014:  Lyme - neg, RPR neg, vitamin B12 1744, TSH 1.2, HbA1c 5.8, heavy metals neg  CT brain 08/31/1997: 1. NORMAL CT BRAIN SCAN.  2. AIR/FLUID LEVEL IN THE RIGHT SPHENOID SINUS.   Past Medical History  Diagnosis Date  . Palpitations   . Mitral valve disorders   . Diverticulitis of colon (without mention of hemorrhage)   . Internal hemorrhoids without mention of complication   . Diverticulosis of colon (without mention of hemorrhage)   . Cervical dysplasia 1976  . Elevated cholesterol   . MRSA infection   . Pelvic adhesive disease   . Hypertension   . Prediabetes   . Vitamin D deficiency     Past Surgical History  Procedure Laterality Date  . Tympanoplasty    . Rotator cuff repair      LEFT SHOULDER  . Ectopic pregnancy surgery  1971, 1979  . Hemorrhoid surgery    . Oophorectomy    . Gynecologic cryosurgery  1976  . Abdominal hysterectomy  2000    TAH,BSO endometriosis, leiomyomata  . Rotator cuff repair Right 2/15     Medications:  Current Outpatient Prescriptions on File Prior to Visit  Medication Sig Dispense Refill  . B Complex Vitamins (B COMPLEX PO) Take 1 tablet by mouth daily.       . Cholecalciferol (VITAMIN D3) 5000 UNITS TABS Take 1 tablet by mouth daily.        Marland Kitchen  LORazepam (ATIVAN) 1 MG tablet Take 1 tablet (1 mg total) by mouth at bedtime. Take 2 tabs at QHS  60 tablet  5  . MAGNESIUM PO Take 1 tablet by mouth daily.       . metoprolol tartrate (LOPRESSOR) 25 MG tablet TAKE ONE TABLET BY MOUTH TWICE A DAY  60 tablet  2  . pravastatin (PRAVACHOL) 20 MG tablet TAKE 1 TABLET BY MOUTH DAILY  90 tablet  3  . sertraline (ZOLOFT) 50 MG tablet Take 1 tablet (50 mg total) by mouth daily.  30 tablet  12   No current facility-administered medications on file prior to visit.    Allergies:  Allergies  Allergen  Reactions  . Ketek [Telithromycin]     Family History: Family History  Problem Relation Age of Onset  . Diabetes Mother   . Hypertension Mother   . Cancer Mother     melanoma  . Diabetes Father   . Heart disease Father   . Hyperlipidemia Father   . Hypertension Father   . Cancer Sister     UTERINE  . Cancer Maternal Grandmother     COLON  . Heart disease Maternal Grandfather   . Heart disease Paternal Grandmother   . Heart disease Paternal Grandfather     Social History: History   Social History  . Marital Status: Married    Spouse Name: N/A    Number of Children: N/A  . Years of Education: N/A   Occupational History  . Not on file.   Social History Main Topics  . Smoking status: Former Smoker    Types: Cigarettes    Quit date: 05/16/2013  . Smokeless tobacco: Not on file  . Alcohol Use: 0.5 oz/week    1 drink(s) per week  . Drug Use: Not on file  . Sexual Activity: Yes    Birth Control/ Protection: Surgical   Other Topics Concern  . Not on file   Social History Narrative  . No narrative on file    Review of Systems:  CONSTITUTIONAL: No fevers, chills, night sweats, or weight loss.   EYES: No visual changes or eye pain ENT: No hearing changes.  No history of nose bleeds.   RESPIRATORY: No cough, wheezing and shortness of breath.   CARDIOVASCULAR: Negative for chest pain, and palpitations.   GI: Negative for abdominal discomfort, blood in stools or black stools.  No recent change in bowel habits.   GU:  No history of incontinence.   MUSCLOSKELETAL: + history of joint pain or swelling.  No myalgias.   SKIN: Negative for lesions, rash, and itching.   HEMATOLOGY/ONCOLOGY: Negative for prolonged bleeding, bruising easily, and swollen nodes.   ENDOCRINE: Negative for cold or heat intolerance, polydipsia or goiter.   PSYCH:  No depression or anxiety symptoms.   NEURO: As Above.   Vital Signs:  BP 100/68  Pulse 47  Ht 5\' 4"  (1.626 m)  Wt 127 lb 3 oz  (57.692 kg)  BMI 21.82 kg/m2  SpO2 97%   General Medical Exam:   General:  Thin-appearing, comfortable.   Eyes/ENT: see cranial nerve examination.   Neck: No masses appreciated.  Full range of motion without tenderness.  No carotid bruits. Respiratory:  Clear to auscultation, good air entry bilaterally.   Cardiac:  Regular rate and rhythm, no murmur.    Extremities:  No deformities, edema, or skin discoloration. Good capillary refill.   Skin:  Skin color, texture, turgor normal. No rashes or lesions.  Neurological Exam: MENTAL STATUS including orientation to time, place, person, recent and remote memory, attention span and concentration, language, and fund of knowledge is normal.  Speech is not dysarthric.  CRANIAL NERVES: II:  No visual field defects.  Unremarkable fundi.   III-IV-VI: Pupils equal round and reactive to light.  Normal conjugate, extra-ocular eye movements in all directions of gaze.  No nystagmus.  No ptosis. V:  Normal facial sensation.    VII:  Normal facial symmetry and movements.    VIII:  Normal hearing and vestibular function.   IX-X:  Normal palatal movement.   XI:  Normal shoulder shrug and head rotation.   XII:  Normal tongue strength and range of motion, no deviation or fasciculation.  MOTOR: Generalized loss of muscle bulk.  No fasciculations or abnormal movements.  No pronator drift.  Tone is normal.    Right Upper Extremity:    Left Upper Extremity:    Deltoid  5/5   Deltoid  5/5   Biceps  5/5   Biceps  5/5   Triceps  5/5   Triceps  5/5   Wrist extensors  5/5   Wrist extensors  5/5   Wrist flexors  5/5   Wrist flexors  5/5   Finger extensors  5/5   Finger extensors  5/5   Finger flexors  5/5   Finger flexors  5/5   Dorsal interossei  5-/5   Dorsal interossei  5-/5   Abductor pollicis  5-/5   Abductor pollicis  5-/5   Tone (Ashworth scale)  0  Tone (Ashworth scale)  0   Right Lower Extremity:    Left Lower Extremity:    Hip flexors  5/5   Hip  flexors  5/5   Hip extensors  5/5   Hip extensors  5/5   Knee flexors  5/5   Knee flexors  5/5   Knee extensors  5/5   Knee extensors  5/5   Dorsiflexors  5/5   Dorsiflexors  5/5   Plantarflexors  5/5   Plantarflexors  5/5   Toe extensors  5/5   Toe extensors  5/5   Toe flexors  5/5   Toe flexors  5/5   Tone (Ashworth scale)  0  Tone (Ashworth scale)  0   MSRs:  Right                                                                 Left brachioradialis 2+  brachioradialis 2+  biceps 2+  biceps 2+  triceps 2+  triceps 2+  patellar 2+  patellar 2+  ankle jerk 2+  ankle jerk 2+  Hoffman no  Hoffman no  plantar response down  plantar response down   SENSORY:  Normal and symmetric perception of light touch, pinprick, vibration, and proprioception.  Romberg's sign absent.   COORDINATION/GAIT: Normal finger-to- nose-finger and heel-to-shin.  Intact rapid alternating movements bilaterally.  Able to rise from a chair without using arms.  Gait narrow based and stable. Tandem and stressed gait intact.   IMPRESSION: Mrs. [name] is a 68 year-old female presenting for evaluation of reduced grip strength of the right hand and muscle atrophy.  Her neurological exam shows generalized loss of muscle bulk throughout, however she has  a small body habitus and this may be normal for patient. There is mild intrinsic hand and atrophy, however motor strength seems relatively preserved throughout.  Reflexes and sensation is normal. I have reviewed her EMG performed at [facility] which shows bilateral median motor responses are reduced and the right sensory median response is prolonged, consistent with carpal tunnel syndrome. A T1-radiculopathy and also selectively affecting the abductor pollicis brevis is possible but I would expect to see chronic changes on needle examination of the APB.  To evaluate further, I will order MRI of the cervical spine to see if there is foraminal stenosis at this level.  Laboratory tests to include CK, aldolase, copper, and ceruloplasmin.    If these are within normal limits, occupational therapy can be done going forward. If there is no improvement, repeat electrodiagnostic testing in 3-6 months may be indicated.   PLAN/RECOMMENDATIONS:  1. Check CK, aldolase, copper, ceruloplasmin 2. MRI cervical spine wo contrast 3. Return to clinic in 3 months.   The duration of this appointment visit was 50 minutes of face-to-face time with the patient.  Greater than 50% of this time was spent in counseling, explanation of diagnosis, planning of further management, and coordination of care.   Thank you for allowing me to participate in patient's care.  If I can answer any additional questions, I would be pleased to do so.    Sincerely,    Donika K. Posey Pronto, DO

## 2014-05-21 NOTE — Patient Instructions (Addendum)
1.  Check blood work 2.  MRI cervical spine without contrast.  Results will be communicated via telephone. 3.  Return to clinic in 78-months

## 2014-05-23 LAB — COPPER, SERUM: Copper: 115 ug/dL (ref 70–175)

## 2014-05-23 LAB — ALDOLASE: ALDOLASE: 6 U/L (ref <=8.1)

## 2014-05-27 LAB — CERULOPLASMIN: CERULOPLASMIN: 33 mg/dL (ref 18–53)

## 2014-05-30 ENCOUNTER — Telehealth: Payer: Self-pay | Admitting: *Deleted

## 2014-05-30 NOTE — Telephone Encounter (Signed)
Patient called MRI for 05-31-2014 has been cancelled due to not being pre auth in time patient needs to have this rescheduled and check to see where it is in the [place] process

## 2014-05-31 ENCOUNTER — Inpatient Hospital Stay: Admission: RE | Admit: 2014-05-31 | Payer: Medicare HMO | Source: Ambulatory Visit

## 2014-06-02 NOTE — Telephone Encounter (Signed)
Good morning.  Do you know when this will be approved?

## 2014-06-02 NOTE — Telephone Encounter (Signed)
[name] [name] was started, but insurance stated that they have 2 full business to make their decision, and they would let us know via fax when they do.

## 2014-06-02 NOTE — Telephone Encounter (Signed)
Spoke with patient and informed her of what [employer] had said.  I will r/s after it is approved and call her back with appt.

## 2014-06-03 ENCOUNTER — Ambulatory Visit (INDEPENDENT_AMBULATORY_CARE_PROVIDER_SITE_OTHER): Payer: Medicare HMO | Admitting: Physician Assistant

## 2014-06-03 ENCOUNTER — Encounter: Payer: Self-pay | Admitting: Physician Assistant

## 2014-06-03 VITALS — BP 102/62 | HR 64 | Temp 98.8°F | Resp 16 | Ht 64.0 in | Wt 128.0 lb

## 2014-06-03 DIAGNOSIS — E7800 Pure hypercholesterolemia, unspecified: Secondary | ICD-10-CM

## 2014-06-03 DIAGNOSIS — R7303 Prediabetes: Secondary | ICD-10-CM

## 2014-06-03 DIAGNOSIS — R3129 Other microscopic hematuria: Secondary | ICD-10-CM

## 2014-06-03 DIAGNOSIS — M6289 Other specified disorders of muscle: Secondary | ICD-10-CM

## 2014-06-03 DIAGNOSIS — E782 Mixed hyperlipidemia: Secondary | ICD-10-CM

## 2014-06-03 DIAGNOSIS — E559 Vitamin D deficiency, unspecified: Secondary | ICD-10-CM

## 2014-06-03 DIAGNOSIS — I10 Essential (primary) hypertension: Secondary | ICD-10-CM

## 2014-06-03 DIAGNOSIS — R312 Other microscopic hematuria: Secondary | ICD-10-CM

## 2014-06-03 DIAGNOSIS — R7309 Other abnormal glucose: Secondary | ICD-10-CM

## 2014-06-03 DIAGNOSIS — R29898 Other symptoms and signs involving the musculoskeletal system: Secondary | ICD-10-CM

## 2014-06-03 DIAGNOSIS — Z5181 Encounter for therapeutic drug level monitoring: Secondary | ICD-10-CM

## 2014-06-03 LAB — CBC WITH DIFFERENTIAL/PLATELET
BASOS PCT: 0 % (ref 0–1)
Basophils Absolute: 0 10*3/uL (ref 0.0–0.1)
EOS ABS: 0.2 10*3/uL (ref 0.0–0.7)
Eosinophils Relative: 3 % (ref 0–5)
HEMATOCRIT: 34.8 % — AB (ref 36.0–46.0)
HEMOGLOBIN: 11.7 g/dL — AB (ref 12.0–15.0)
LYMPHS ABS: 2.3 10*3/uL (ref 0.7–4.0)
Lymphocytes Relative: 34 % (ref 12–46)
MCH: 30.9 pg (ref 26.0–34.0)
MCHC: 33.6 g/dL (ref 30.0–36.0)
MCV: 91.8 fL (ref 78.0–100.0)
MONO ABS: 0.3 10*3/uL (ref 0.1–1.0)
MONOS PCT: 5 % (ref 3–12)
NEUTROS ABS: 3.9 10*3/uL (ref 1.7–7.7)
NEUTROS PCT: 58 % (ref 43–77)
Platelets: 286 10*3/uL (ref 150–400)
RBC: 3.79 MIL/uL — AB (ref 3.87–5.11)
RDW: 14.1 % (ref 11.5–15.5)
WBC: 6.7 10*3/uL (ref 4.0–10.5)

## 2014-06-03 LAB — BASIC METABOLIC PANEL WITH GFR
BUN: 13 mg/dL (ref 6–23)
CO2: 28 mEq/L (ref 19–32)
Calcium: 9.5 mg/dL (ref 8.4–10.5)
Chloride: 103 mEq/L (ref 96–112)
Creat: 0.72 mg/dL (ref 0.50–1.10)
GFR, Est Non African American: 88 mL/min
GLUCOSE: 90 mg/dL (ref 70–99)
POTASSIUM: 4.7 meq/L (ref 3.5–5.3)
Sodium: 139 mEq/L (ref 135–145)

## 2014-06-03 LAB — HEPATIC FUNCTION PANEL
ALK PHOS: 54 U/L (ref 39–117)
ALT: 9 U/L (ref 0–35)
AST: 13 U/L (ref 0–37)
Albumin: 4.3 g/dL (ref 3.5–5.2)
Bilirubin, Direct: 0.1 mg/dL (ref 0.0–0.3)
Indirect Bilirubin: 0.4 mg/dL (ref 0.2–1.2)
TOTAL PROTEIN: 6.7 g/dL (ref 6.0–8.3)
Total Bilirubin: 0.5 mg/dL (ref 0.2–1.2)

## 2014-06-03 LAB — LIPID PANEL
Cholesterol: 196 mg/dL (ref 0–200)
HDL: 81 mg/dL (ref >39)
LDL Cholesterol: 97 mg/dL (ref 0–99)
Total CHOL/HDL Ratio: 2.4 Ratio
Triglycerides: 91 mg/dL (ref <150)
VLDL: 18 mg/dL (ref 0–40)

## 2014-06-03 LAB — MAGNESIUM: MAGNESIUM: 2 mg/dL (ref 1.5–2.5)

## 2014-06-03 NOTE — Patient Instructions (Signed)

## 2014-06-03 NOTE — Progress Notes (Signed)
Assessment and Plan:  Hypertension: Continue medication, monitor blood pressure at home. Continue DASH diet. Cholesterol: Continue diet and exercise. Check cholesterol.  Pre-diabetes-Continue diet and exercise. Check A1C Vitamin D Def- check level and continue medications.   Continue diet and meds as discussed. Further disposition pending results of labs.  HPI 68 y.o. female  presents for 3 month follow up with hypertension, hyperlipidemia, prediabetes and vitamin D. Her blood pressure has been controlled at home, today their BP is BP: 102/62 mmHg She does workout, she has joined the [facility] and does water exercises 3 x a week. She denies chest pain, shortness of breath, dizziness.  She is on cholesterol medication and denies myalgias. Her cholesterol is at goal. The cholesterol last visit was:   Lab Results  Component Value Date   CHOL 219* 02/18/2014   HDL 84 02/18/2014   LDLCALC 114* 02/18/2014   TRIG 107 02/18/2014   CHOLHDL 2.6 02/18/2014   She has been working on diet and exercise for prediabetes, and denies polydipsia, polyuria and visual disturbances. Last A1C in the office was:  Lab Results  Component Value Date   HGBA1C 5.8* 02/18/2014   Patient is on Vitamin D supplement.   Lab Results  Component Value Date   VD25OH 48 02/18/2014     She has mononeuropathy in her right hand, she has seen neuro and ortho, she had nerve conduction test that showed very mild carpal tunnel that would not explain her symptoms, she is suppose to be getting an MRI of her neck with her neurologist. Denies dropping objects/weakness.   Current Medications:  Current Outpatient Prescriptions on File Prior to Visit  Medication Sig Dispense Refill  . B Complex Vitamins (B COMPLEX PO) Take 1 tablet by mouth daily.       . Cholecalciferol (VITAMIN D3) 5000 UNITS TABS Take 1 tablet by mouth daily.        Marland Kitchen LORazepam (ATIVAN) 1 MG tablet Take 1 tablet (1 mg total) by mouth at bedtime. Take 2 tabs at QHS  60  tablet  5  . MAGNESIUM PO Take 1 tablet by mouth daily.       . metoprolol tartrate (LOPRESSOR) 25 MG tablet TAKE ONE TABLET BY MOUTH TWICE A DAY  60 tablet  2  . pravastatin (PRAVACHOL) 20 MG tablet TAKE 1 TABLET BY MOUTH DAILY  90 tablet  3  . sertraline (ZOLOFT) 50 MG tablet Take 1 tablet (50 mg total) by mouth daily.  30 tablet  12   No current facility-administered medications on file prior to visit.   Medical History:  Past Medical History  Diagnosis Date  . Palpitations   . Mitral valve disorders   . Diverticulitis of colon (without mention of hemorrhage)   . Internal hemorrhoids without mention of complication   . Diverticulosis of colon (without mention of hemorrhage)   . Cervical dysplasia 1976  . Elevated cholesterol   . MRSA infection   . Pelvic adhesive disease   . Hypertension   . Prediabetes   . Vitamin D deficiency    Allergies:  Allergies  Allergen Reactions  . Ketek [Telithromycin]      Review of Systems: [X]  = complains of  [ ]  = denies  General: Fatigue [ ]  Fever [ ]  Chills [ ]  Weakness [ ]   Insomnia [ ]  Eyes: Redness [ ]  Blurred vision [ ]  Diplopia [ ]   ENT: Congestion [ ]  Sinus Pain [ ]  Post Nasal Drip [ ]  Sore Throat [ ]   Earache [ ]   Cardiac: Chest pain/pressure [ ]  SOB [ ]  Orthopnea [ ]   Palpitations [ ]   Paroxysmal nocturnal dyspnea[ ]  Claudication [ ]  Edema [ ]   Pulmonary: Cough [ ]  Wheezing[ ]   SOB [ ]   Snoring [ ]   GI: Nausea [ ]  Vomiting[ ]  Dysphagia[ ]  Heartburn[ ]  Abdominal pain [ ]  Constipation [ ] ; Diarrhea [ ] ; BRBPR [ ]  Melena[ ]  GU: Hematuria[ ]  Dysuria [ ]  Nocturia[ ]  Urgency [ ]   Hesitancy [ ]  Discharge [ ]  Neuro: Headaches[ ]  Vertigo[ ]  Paresthesias[ ]  Spasm [ ]  Speech changes [ ]  Incoordination [ ]   Ortho: Arthritis [ ]  Joint pain [ ]  Muscle pain [ ]  Joint swelling [ ]  Back Pain [ ]  Skin:  Rash [ ]   Pruritis [ ]  Change in skin lesion [ ]   Psych: Depression[ ]  Anxiety[ ]  Confusion [ ]  Memory loss [ ]   Heme/Lypmh: Bleeding [ ]  Bruising  [ ]  Enlarged lymph nodes [ ]   Endocrine: Visual blurring [ ]  Paresthesia [ ]  Polyuria [ ]  Polydypsea [ ]    Heat/cold intolerance [ ]  Hypoglycemia [ ]   Family history- Review and unchanged Social history- Review and unchanged Physical Exam: BP 102/62  Pulse 64  Temp(Src) 98.8 F (37.1 C)  Resp 16  Ht 5\' 4"  (1.626 m)  Wt 128 lb (58.06 kg)  BMI 21.96 kg/m2 Wt Readings from Last 3 Encounters:  06/03/14 128 lb (58.06 kg)  05/21/14 127 lb 3 oz (57.692 kg)  04/11/14 125 lb (56.7 kg)   General Appearance: Well nourished, in no apparent distress. Eyes: PERRLA, EOMs, conjunctiva no swelling or erythema Sinuses: No Frontal/maxillary tenderness ENT/Mouth: Ext aud canals clear, TMs without erythema, bulging. No erythema, swelling, or exudate on post pharynx.  Tonsils not swollen or erythematous. Hearing normal.  Neck: Supple, thyroid normal.  Respiratory: Respiratory effort normal, BS equal bilaterally without rales, rhonchi, wheezing or stridor.  Cardio: RRR with no MRGs. Brisk peripheral pulses without edema.  Abdomen: Soft, + BS.  Non tender, no guarding, rebound, hernias, masses. Lymphatics: Non tender without lymphadenopathy.  Musculoskeletal: Full ROM, 5/5 strength, normal gait.  Skin: Warm, dry without rashes, lesions, ecchymosis.  Neuro: Cranial nerves intact. Normal muscle tone, no cerebellar symptoms. Sensation intact.  Psych: Awake and oriented X 3, normal affect, Insight and Judgment appropriate.    Vicie Mutters 3:56 PM

## 2014-06-04 LAB — URINALYSIS, ROUTINE W REFLEX MICROSCOPIC
BILIRUBIN URINE: NEGATIVE
Glucose, UA: NEGATIVE mg/dL
Ketones, ur: NEGATIVE mg/dL
Leukocytes, UA: NEGATIVE
Nitrite: NEGATIVE
PROTEIN: NEGATIVE mg/dL
SPECIFIC GRAVITY, URINE: 1.013 (ref 1.005–1.030)
Urobilinogen, UA: 0.2 mg/dL (ref 0.0–1.0)
pH: 5.5 (ref 5.0–8.0)

## 2014-06-04 LAB — URINALYSIS, MICROSCOPIC ONLY
Bacteria, UA: NONE SEEN
CASTS: NONE SEEN
Crystals: NONE SEEN
Squamous Epithelial / LPF: NONE SEEN

## 2014-06-04 LAB — URINE CULTURE
Colony Count: NO GROWTH
ORGANISM ID, BACTERIA: NO GROWTH

## 2014-06-04 LAB — TSH: TSH: 1.232 u[IU]/mL (ref 0.350–4.500)

## 2014-06-04 LAB — HEMOGLOBIN A1C
HEMOGLOBIN A1C: 6 % — AB (ref <5.7)
MEAN PLASMA GLUCOSE: 126 mg/dL — AB (ref <117)

## 2014-06-04 NOTE — Telephone Encounter (Signed)
I had to resend everything in

## 2014-06-04 NOTE — Telephone Encounter (Signed)
Hey [employer] [place].  I still have not heard anything back about this.  Do you know what is going on?

## 2014-06-30 ENCOUNTER — Encounter: Payer: Self-pay | Admitting: Physician Assistant

## 2014-07-01 ENCOUNTER — Encounter: Payer: Self-pay | Admitting: Internal Medicine

## 2014-07-01 ENCOUNTER — Ambulatory Visit (INDEPENDENT_AMBULATORY_CARE_PROVIDER_SITE_OTHER): Payer: Medicare HMO | Admitting: Internal Medicine

## 2014-07-01 VITALS — BP 94/60 | HR 60 | Temp 97.7°F | Resp 16 | Ht 64.0 in | Wt 128.8 lb

## 2014-07-01 DIAGNOSIS — K5732 Diverticulitis of large intestine without perforation or abscess without bleeding: Secondary | ICD-10-CM

## 2014-07-01 DIAGNOSIS — R002 Palpitations: Secondary | ICD-10-CM

## 2014-07-01 DIAGNOSIS — J029 Acute pharyngitis, unspecified: Secondary | ICD-10-CM

## 2014-07-01 DIAGNOSIS — I059 Rheumatic mitral valve disease, unspecified: Secondary | ICD-10-CM

## 2014-07-01 DIAGNOSIS — E782 Mixed hyperlipidemia: Secondary | ICD-10-CM

## 2014-07-01 MED ORDER — PREDNISONE 20 MG PO TABS: ORAL_TABLET | ORAL | Status: AC

## 2014-07-01 NOTE — Progress Notes (Signed)
   Subjective:    Patient ID: [name] [name], female    DOB: 1946-06-04, 68 y.o.   MRN: [id number]  HPI Patient relates treated for sore throat with a  Z Pak ~ 1 week ago at an urgent care center. She ten developed "white bumps" on her tongue and was told to d/c the Z pak and was rx'd Nystatin and 4 tabs of Valtrex. Her sx's of white bumps and sore mouth, throat & tongue have persisted. No fever, chills, cough, head or chest congestion or purulent nasal secretions or sputum.  Medication Sig  . B Complex Vitamins  Take 1 tablet by mouth daily.   Marland Kitchen VITAMIN D 5000 UNITS TABS Take 1 tablet by mouth daily.    Marland Kitchen LORazepam  1 MG tablet Take 1 tablet (1 mg total) by mouth at bedtime. Take 2 tabs at QHS  . MAGNESIUM PO Take 1 tablet by mouth daily.   . metoprolol tar(LOPRESSOR) 25 MG t TAKE ONE TABLET BY MOUTH TWICE A DAY  . pravastatin 20 MG tablet TAKE 1 TABLET BY MOUTH DAILY  . sertraline  50 MG tablet Take 1 tablet (50 mg total) by mouth daily.   Allergies  Allergen Reactions  . Ketek [Telithromycin]    Past Medical History  Diagnosis Date  . Palpitations   . Mitral valve disorders   . Diverticulitis of colon (without mention of hemorrhage)   . Internal hemorrhoids without mention of complication   . Diverticulosis of colon (without mention of hemorrhage)   . Cervical dysplasia 1976  . Elevated cholesterol   . MRSA infection   . Pelvic adhesive disease   . Hypertension   . Prediabetes   . Vitamin D deficiency    Review of Systems Neg & noncontributory to above  Objective:   Physical Exam  BP 94/60 mmHg  Pulse 60  Temp(Src) 97.7 F (36.5 C)  Resp 16  Ht 5\' 4"  (1.626 m)  Wt 128 lb 12.8 oz (58.423 kg)  BMI 22.10 kg/m2  Heent - EAC's /TM's NL. No sinus tenderness. O/p 1-2 (+) injected of soft palate w/o ulceration or exudates seen. Tongue is noted to have slight coating which appears normal and not monilial. Several taste buds at the proximal 1/3 tongue appear slightly  swollen. Neck - supple w/o any adenopathy. Chest - Clear. Cor - RRR w/o sig M.    Assessment & Plan:   1. Acute pharyngitis, suspect viral  - predniSONE (DELTASONE) 20 MG tablet; 1 tab 3 x day for 3 days, then 1 tab 2 x day for 3 days, then 1 tab 1 x day for 5 days  Dispense: 20 tablet; Refill: 0  - Rov if sx's not resolve

## 2014-07-01 NOTE — Patient Instructions (Signed)

## 2014-07-08 ENCOUNTER — Other Ambulatory Visit: Payer: Self-pay | Admitting: Internal Medicine

## 2014-07-14 ENCOUNTER — Other Ambulatory Visit: Payer: Self-pay | Admitting: Cardiovascular Disease

## 2014-07-23 ENCOUNTER — Encounter: Payer: Self-pay | Admitting: Gastroenterology

## 2014-08-12 ENCOUNTER — Ambulatory Visit (INDEPENDENT_AMBULATORY_CARE_PROVIDER_SITE_OTHER): Payer: Medicare HMO | Admitting: Neurology

## 2014-08-12 ENCOUNTER — Encounter: Payer: Self-pay | Admitting: Neurology

## 2014-08-12 VITALS — BP 100/64 | HR 64 | Ht 64.0 in | Wt 130.2 lb

## 2014-08-12 DIAGNOSIS — M792 Neuralgia and neuritis, unspecified: Secondary | ICD-10-CM

## 2014-08-12 DIAGNOSIS — G5603 Carpal tunnel syndrome, bilateral upper limbs: Secondary | ICD-10-CM

## 2014-08-12 DIAGNOSIS — G5601 Carpal tunnel syndrome, right upper limb: Secondary | ICD-10-CM

## 2014-08-12 DIAGNOSIS — M5412 Radiculopathy, cervical region: Secondary | ICD-10-CM

## 2014-08-12 DIAGNOSIS — G5602 Carpal tunnel syndrome, left upper limb: Secondary | ICD-10-CM

## 2014-08-12 NOTE — Progress Notes (Signed)
Follow-up Visit   Date: 08/13/2014   SHANDREKA DANTE MRN: [id number] DOB: 01-13-1946   Interim History: [name] [name] is a 68 y.o. right-handed Caucasian female with hypertension, hyperlipidemia, right CTS, tachycardia, and depression returning to the clinic for follow-up of right hand weakness.  The patient was accompanied to the clinic by self.  History of present illness: She underwent right rotator cuff surgery February 17th 2015. She was getting physical therapy afterwards and and at one of her sessions in March she was stretching her arms back and developed severe sharp pain over the right upper arm (biceps region). The pain quickly improved after her arm was rested again, but the following day, she developed numbness of her entire right hand. The same day she also noticed that she was unable to extend her fingers or completely make a fist. Her numbness has been persistent since onset. No associated tingling. She has noticed that warm water helps her symptoms. Pain is worse when she is using her hands a lot.   She was referred to Dr. Caralyn Guile, hand specialist, who ordered EMG which showed mild right CTS. There was also mention of bilateral hand atrophy for which she has been referred to neurology. She endorses chronic neck pain. No muscle twitches. She is not dropping things and is still able to carry out her usual activities.  Her husband has multiple sclerosis and stays very active with him.  UPDATE 08/12/2014:  She reports having intermittent shooting pain from her neck into her hands, worse on the left.  She had MRI of the cervical spine which showed multilevel degenerative joint disease as well as foraminal stenosis, which is contributing to her radicular pain.  Based on the distribution of her pain, carpal tunnel syndrome is most likely playing a smaller role.   Medications:  Current Outpatient Prescriptions on File Prior to Visit  Medication Sig Dispense  Refill  . B Complex Vitamins (B COMPLEX PO) Take 1 tablet by mouth daily.     . Cholecalciferol (VITAMIN D3) 5000 UNITS TABS Take 1 tablet by mouth daily.      Marland Kitchen LORazepam (ATIVAN) 1 MG tablet Take 1 tablet (1 mg total) by mouth at bedtime. Take 2 tabs at QHS 60 tablet 5  . MAGNESIUM PO Take 1 tablet by mouth daily.     . metoprolol tartrate (LOPRESSOR) 25 MG tablet TAKE ONE TABLET BY MOUTH TWICE A DAY 60 tablet 2  . metoprolol tartrate (LOPRESSOR) 25 MG tablet TAKE ONE TABLET BY MOUTH TWICE A DAY 60 tablet 4  . pravastatin (PRAVACHOL) 20 MG tablet TAKE 1 TABLET BY MOUTH DAILY 90 tablet 3  . sertraline (ZOLOFT) 50 MG tablet TAKE 1 TABLET (50 MG TOTAL) BY MOUTH DAILY. 30 tablet 11   No current facility-administered medications on file prior to visit.    Allergies:  Allergies  Allergen Reactions  . Ketek [Telithromycin]     Review of Systems:  CONSTITUTIONAL: No fevers, chills, night sweats, or weight loss.  EYES: No visual changes or eye pain ENT: No hearing changes.  No history of nose bleeds.   RESPIRATORY: No cough, wheezing and shortness of breath.   CARDIOVASCULAR: Negative for chest pain, and palpitations.   GI: Negative for abdominal discomfort, blood in stools or black stools.  No recent change in bowel habits.   GU:  No history of incontinence.   MUSCLOSKELETAL: No history of joint pain or swelling.  No myalgias.   SKIN: Negative for lesions, rash,  and itching.   ENDOCRINE: Negative for cold or heat intolerance, polydipsia or goiter.   PSYCH:  No depression or anxiety symptoms.   NEURO: As Above.   Vital Signs:  BP 100/64 mmHg  Pulse 64  Ht 5\' 4"  (1.626 m)  Wt 130 lb 3 oz (59.053 kg)  BMI 22.34 kg/m2  SpO2 96%   Neurological Exam: MENTAL STATUS including orientation to time, place, person, recent and remote memory, attention span and concentration, language, and fund of knowledge is normal.  Speech is not dysarthric.  CRANIAL NERVES:  Face is symmetric.  MOTOR:   Motor strength is 5/5 in all extremities, except bilateral FDI and ABP are 5-/5.  No atrophy, fasciculations or abnormal movements.  No pronator drift.  Tone is normal.    MSRs:  Reflexes are 3+/4 throughout, except 2+/4 Achilles bilaterally.  SENSORY:  Intact vibration throughout.  COORDINATION/GAIT:   Gait narrow based and stable.   Data: EMG 02/24/2014: Bilateral median motor responses are reduced in amplitude, L2.7, R4.2 mV. Right median sensory response is prolonged 3.49ms. Needle electrode examination was normal. Findings are consistent with right CTS, per report.  Labs 02/23/2014: Lyme - neg, RPR neg, vitamin B12 1744, TSH 1.2, HbA1c 5.8, heavy metals neg Labs 05/21/2014:  CK 92, aldolase 6.0, ceruloplasmin 33  CT brain 08/31/1997: 1. NORMAL CT BRAIN SCAN.  2. AIR/FLUID LEVEL IN THE RIGHT SPHENOID SINUS.   MRI cervical spine 06/25/2014: 1.  C3-4:  Severe left foraminal and mild central and right foraminal stenosis with compression of the left C4 nerve root due to very severe facet degenerative joint disease with reactive marrow edema, mild disc bulge, and mild spondylolisthesis. 2.  C2-C3: Moderate left foraminal stenosis with displacement of the left C3 nerve root due to severe facet degenerative joint disease and small disc protrusion 3.  C4-C5: Mild left foraminal and central stenosis with mild flattening of the cord due to disc osteophyte complex and mild left facet DJD. 4. C5-C6: Moderate left foraminal and mild central disc stenosis with displacement of the left C6 nerve root and mild flattening of the cord due to disc osteophyte complex asymmetric to the left and mild left facet DJD. 5.  C6-C7: Moderate right and mild left foraminal stenosis with displacement of the right C7 nerve root due to a disc osteophyte complex asymmetric to the right.   IMPRESSION/PLAN: 1.  Cervical radicular pain affecting the left arm due to multilevel cervical canal and foraminal stenosis  - Images  personally reviewed with patient and shows moderate to severe multilevel foraminal stenosis with nerve root impingement  - She is scheduled to see Dr. Nelva Bush for epidural steroid injections  - OT recommended, but she would like to seek Dr. Nelva Bush' opinion first  2.  Bilateral CTS, mild  - Consider repeat EMG going forward, if symptoms worsen  - Continue wrist splint  3.  Return to clinic as needed    The duration of this appointment visit was 25 minutes of face-to-face time with the patient.  Greater than 50% of this time was spent in counseling, explanation of diagnosis, planning of further management, and coordination of care.   Thank you for allowing me to participate in patient's care.  If I can answer any additional questions, I would be pleased to do so.    Sincerely,    Donika K. Posey Pronto, DO

## 2014-08-25 ENCOUNTER — Other Ambulatory Visit: Payer: Self-pay | Admitting: *Deleted

## 2014-08-25 MED ORDER — LORAZEPAM 1 MG PO TABS: 1.0000 mg | ORAL_TABLET | Freq: Every day | ORAL | Status: DC

## 2014-09-29 ENCOUNTER — Other Ambulatory Visit: Payer: Self-pay | Admitting: Internal Medicine

## 2014-10-02 ENCOUNTER — Encounter: Payer: Self-pay | Admitting: Internal Medicine

## 2014-10-02 ENCOUNTER — Ambulatory Visit (INDEPENDENT_AMBULATORY_CARE_PROVIDER_SITE_OTHER): Payer: PPO | Admitting: Internal Medicine

## 2014-10-02 VITALS — BP 100/64 | HR 72 | Temp 97.5°F | Resp 16 | Ht 64.0 in | Wt 133.6 lb

## 2014-10-02 DIAGNOSIS — Z79899 Other long term (current) drug therapy: Secondary | ICD-10-CM | POA: Insufficient documentation

## 2014-10-02 DIAGNOSIS — E559 Vitamin D deficiency, unspecified: Secondary | ICD-10-CM

## 2014-10-02 DIAGNOSIS — E782 Mixed hyperlipidemia: Secondary | ICD-10-CM

## 2014-10-02 DIAGNOSIS — I10 Essential (primary) hypertension: Secondary | ICD-10-CM

## 2014-10-02 DIAGNOSIS — R7303 Prediabetes: Secondary | ICD-10-CM

## 2014-10-02 DIAGNOSIS — R7309 Other abnormal glucose: Secondary | ICD-10-CM

## 2014-10-02 LAB — CBC WITH DIFFERENTIAL/PLATELET
Basophils Absolute: 0 10*3/uL (ref 0.0–0.1)
Basophils Relative: 0 % (ref 0–1)
EOS PCT: 2 % (ref 0–5)
Eosinophils Absolute: 0.1 10*3/uL (ref 0.0–0.7)
HCT: 36.4 % (ref 36.0–46.0)
Hemoglobin: 12.3 g/dL (ref 12.0–15.0)
LYMPHS PCT: 35 % (ref 12–46)
Lymphs Abs: 2.3 10*3/uL (ref 0.7–4.0)
MCH: 31.6 pg (ref 26.0–34.0)
MCHC: 33.8 g/dL (ref 30.0–36.0)
MCV: 93.6 fL (ref 78.0–100.0)
MPV: 8.6 fL (ref 8.6–12.4)
Monocytes Absolute: 0.5 10*3/uL (ref 0.1–1.0)
Monocytes Relative: 7 % (ref 3–12)
NEUTROS ABS: 3.8 10*3/uL (ref 1.7–7.7)
NEUTROS PCT: 56 % (ref 43–77)
Platelets: 269 10*3/uL (ref 150–400)
RBC: 3.89 MIL/uL (ref 3.87–5.11)
RDW: 14 % (ref 11.5–15.5)
WBC: 6.7 10*3/uL (ref 4.0–10.5)

## 2014-10-02 LAB — HEMOGLOBIN A1C
Hgb A1c MFr Bld: 5.7 % — ABNORMAL HIGH (ref <5.7)
MEAN PLASMA GLUCOSE: 117 mg/dL — AB (ref <117)

## 2014-10-02 NOTE — Patient Instructions (Signed)

## 2014-10-02 NOTE — Progress Notes (Signed)
Patient ID: [name] [name], female   DOB: 1946-02-01, 69 y.o.   MRN: [id number]   This very nice 69 y.o. MWF presents for 68 month follow up with Hypertension, Hyperlipidemia, Pre-Diabetes and Vitamin D Deficiency. Patient also reports a 5-7 day prodrome of a dry hacking non productive cough and occasional hoarseness and laryngitis. Denies fever/chills of head/chest congestion.   Patient is treated for HTN & BP has been controlled at home. Today's BP: 100/64 mmHg. Patient has had no complaints of any cardiac type chest pain, palpitations, dyspnea/orthopnea/PND, dizziness, claudication, or dependent edema.   Hyperlipidemia is controlled with diet & meds. Patient denies myalgias or other med SE's. Last Lipids were at goal -  Total Cholesterol, 196; HDL  81; LDL  97; Trig 91 on 06/03/2014.   Also, the patient has history of PreDiabetes and has had no symptoms of reactive hypoglycemia, diabetic polys, paresthesias or visual blurring.  Last A1c was  6.0% of 06/03/2014.   Further, the patient also has history of Vitamin D Deficiency and supplements vitamin D without any suspected side-effects. Last vitamin D was 79 on  02/18/2014.  Medication Sig  . B Complex Vitamins Take 1 tablet by mouth daily.   Marland Kitchen VITAMIN D 5000 UNITS  Take 1 tablet by mouth daily.    Marland Kitchen LORazepam (ATIVAN) 1 MG tablet Take 1 tablet (1 mg total) by mouth at bedtime. Take 2 tabs at QHS  . MAGNESIUM PO Take 1 tablet by mouth daily.   . metoprolol tartrate (LOPRESSOR) 25 MG  TAKE ONE TABLET BY MOUTH TWICE A DAY  . pravastatin  20 MG tablet TAKE 1 TABLET(S) BY MOUTH DAILY  . Sertraline 50 MG tablet TAKE 1 TABLET (50 MG TOTAL) BY MOUTH DAILY.  . metoprolol tartrate (LOPRESSOR) 25 MG  TAKE ONE TABLET BY MOUTH TWICE A DAY   Allergies  Allergen Reactions  . Ketek [Telithromycin]    PMHx:   Past Medical History  Diagnosis Date  . Palpitations   . Mitral valve disorders   . Diverticulitis of colon (without mention of hemorrhage)   .  Internal hemorrhoids without mention of complication   . Diverticulosis of colon (without mention of hemorrhage)   . Cervical dysplasia 1976  . Elevated cholesterol   . MRSA infection   . Pelvic adhesive disease   . Hypertension   . Prediabetes   . Vitamin D deficiency    Immunization History  Administered Date(s) Administered  . Pneumococcal Polysaccharide-23 12/22/2004  . Td 12/16/2003   Past Surgical History  Procedure Laterality Date  . Tympanoplasty    . Rotator cuff repair      LEFT SHOULDER  . Ectopic pregnancy surgery  1971, 1979  . Hemorrhoid surgery    . Oophorectomy    . Gynecologic cryosurgery  1976  . Abdominal hysterectomy  2000    TAH,BSO endometriosis, leiomyomata  . Rotator cuff repair Right 2/15   FHx:    Reviewed / unchanged  SHx:    Reviewed / unchanged  Systems Review:  Constitutional: Denies fever, chills, wt changes, headaches, insomnia, fatigue, night sweats, change in appetite. Eyes: Denies redness, blurred vision, diplopia, discharge, itchy, watery eyes.  ENT: Denies discharge, congestion, post nasal drip, epistaxis, sore throat, earache, hearing loss, dental pain, tinnitus, vertigo, sinus pain, snoring.  CV: Denies chest pain, palpitations, irregular heartbeat, syncope, dyspnea, diaphoresis, orthopnea, PND, claudication or edema. Respiratory: denies cough, dyspnea, DOE, pleurisy, hoarseness, laryngitis, wheezing.  Gastrointestinal: Denies dysphagia, odynophagia, heartburn, reflux, water brash,  abdominal pain or cramps, nausea, vomiting, bloating, diarrhea, constipation, hematemesis, melena, hematochezia  or hemorrhoids. Genitourinary: Denies dysuria, frequency, urgency, nocturia, hesitancy, discharge, hematuria or flank pain. Musculoskeletal: Denies arthralgias, myalgias, stiffness, jt. swelling, pain, limping or strain/sprain.  Skin: Denies pruritus, rash, hives, warts, acne, eczema or change in skin lesion(s). Neuro: No weakness, tremor,  incoordination, spasms, paresthesia or pain. Psychiatric: Denies confusion, memory loss or sensory loss. Endo: Denies change in weight, skin or hair change.  Heme/Lymph: No excessive bleeding, bruising or enlarged lymph nodes.  Physical Exam  BP 100/64   Pulse 72  Temp 97.5 F   Resp 16  Ht 5\' 4"    Wt 133 lb 9.6 oz     BMI 22.92   Appears well nourished and in no distress. Eyes: PERRLA, EOMs, conjunctiva no swelling or erythema. Sinuses: No frontal/maxillary tenderness ENT/Mouth: EAC's clear, TM's nl w/o erythema, bulging. Nares clear w/o erythema, swelling, exudates. Oropharynx clear without erythema or exudates. Oral hygiene is good. Tongue normal, non obstructing. Hearing intact.  Neck: Supple. Thyroid nl. Car 2+/2+ without bruits, nodes or JVD. Chest: Respirations nl with BS clear & equal w/o rales, rhonchi, wheezing or stridor.  Cor: Heart sounds normal w/ regular rate and rhythm without sig. murmurs, gallops, clicks, or rubs. Peripheral pulses normal and equal  without edema.  Abdomen: Soft & bowel sounds normal. Non-tender w/o guarding, rebound, hernias, masses, or organomegaly.  Lymphatics: Unremarkable.  Musculoskeletal: Full ROM all peripheral extremities, joint stability, 5/5 strength, and normal gait.  Skin: Warm, dry without exposed rashes, lesions or ecchymosis apparent.  Neuro: Cranial nerves intact, reflexes equal bilaterally. Sensory-motor testing grossly intact. Tendon reflexes grossly intact.  Pysch: Alert & oriented x 3.  Insight and judgement nl & appropriate. No ideations.  Assessment and Plan:  1. Hypertension - Continue monitor blood pressure at home. Continue diet/meds same.  2. Hyperlipidemia - Continue diet/meds, exercise,& lifestyle modifications. Continue monitor periodic cholesterol/liver & renal functions   3. Pre-Diabetes - Continue diet, exercise, lifestyle modifications. Monitor appropriate labs.  4. Vitamin D Deficiency - Continue  supplementation.  5. Cough ? Allergic - defer Dx/Tx pending progression   Recommended regular exercise, BP monitoring, weight control, and discussed med and SE's. Recommended labs to assess and monitor clinical status. Further disposition pending results of labs.

## 2014-10-03 LAB — BASIC METABOLIC PANEL WITH GFR
BUN: 11 mg/dL (ref 6–23)
CALCIUM: 9.6 mg/dL (ref 8.4–10.5)
CO2: 25 mEq/L (ref 19–32)
Chloride: 103 mEq/L (ref 96–112)
Creat: 0.77 mg/dL (ref 0.50–1.10)
GFR, EST NON AFRICAN AMERICAN: 80 mL/min
GFR, Est African American: >89 mL/min
GLUCOSE: 85 mg/dL (ref 70–99)
POTASSIUM: 4.3 meq/L (ref 3.5–5.3)
Sodium: 139 mEq/L (ref 135–145)

## 2014-10-03 LAB — LIPID PANEL
CHOLESTEROL: 209 mg/dL — AB (ref 0–200)
HDL: 90 mg/dL (ref >39)
LDL Cholesterol: 103 mg/dL — ABNORMAL HIGH (ref 0–99)
Total CHOL/HDL Ratio: 2.3 Ratio
Triglycerides: 79 mg/dL (ref <150)
VLDL: 16 mg/dL (ref 0–40)

## 2014-10-03 LAB — INSULIN, FASTING: INSULIN FASTING, SERUM: 2.1 u[IU]/mL (ref 2.0–19.6)

## 2014-10-03 LAB — HEPATIC FUNCTION PANEL
ALT: 13 U/L (ref 0–35)
AST: 16 U/L (ref 0–37)
Albumin: 4.1 g/dL (ref 3.5–5.2)
Alkaline Phosphatase: 49 U/L (ref 39–117)
BILIRUBIN DIRECT: 0.1 mg/dL (ref 0.0–0.3)
BILIRUBIN INDIRECT: 0.3 mg/dL (ref 0.2–1.2)
Total Bilirubin: 0.4 mg/dL (ref 0.2–1.2)
Total Protein: 6.6 g/dL (ref 6.0–8.3)

## 2014-10-03 LAB — TSH: TSH: 1.498 u[IU]/mL (ref 0.350–4.500)

## 2014-10-03 LAB — VITAMIN D 25 HYDROXY (VIT D DEFICIENCY, FRACTURES): Vit D, 25-Hydroxy: 64 ng/mL (ref 30–100)

## 2014-10-03 LAB — MAGNESIUM: Magnesium: 1.8 mg/dL (ref 1.5–2.5)

## 2014-10-08 NOTE — Progress Notes (Signed)
Patient ID: [name] [name], female   DOB: [birth year], 69 y.o.   MRN: [id number] Karelly is seen today for F/U of palpitatiions, PAC's and  Previous  smoking She denies SSCP, dyspnea, palpitations or syncope. She has no known structural heart disease. She has stopped smoking since 2014  I congratulated her on this. She used Chantix CXR last year ok . No new issues seeing Dr Ernestine Conrad now   Still not smoking since 4/14  Had right shoulder surgery for torn tendon  Seeing Dr Onnie Graham  Injured right wrist with neuropathy during PT in March Now complaining of bilateral and weakness Has seen neuro and diagnosed with bilateral CTS and has left mulilevel cervical foraminal narrowing  ? Epidural planned with Dr Nelva Bush      ROS: Denies fever, malais, weight loss, blurry vision, decreased visual acuity, cough, sputum, SOB, hemoptysis, pleuritic pain, palpitaitons, heartburn, abdominal pain, melena, lower extremity edema, claudication, or rash.  All other systems reviewed and negative  General: Affect appropriate Healthy:  appears stated age 69: normal Neck supple with no adenopathy JVP normal no bruits no thyromegaly Lungs clear with no wheezing and good diaphragmatic motion Heart:  S1/S2 no murmur, no rub, gallop or click PMI normal Abdomen: benighn, BS positve, no tenderness, no AAA no bruit.  No HSM or HJR Distal pulses intact with no bruits No edema Neuro non-focal Skin warm and dry Weakness with poor hand grip bilaterally and ? Thenar atrophy   Current Outpatient Prescriptions  Medication Sig Dispense Refill  . B Complex Vitamins (B COMPLEX PO) Take 1 tablet by mouth daily.     . Cholecalciferol (VITAMIN D3) 5000 UNITS TABS Take 1 tablet by mouth daily.      Marland Kitchen LORazepam (ATIVAN) 1 MG tablet Take 1 tablet (1 mg total) by mouth at bedtime. Take 2 tabs at QHS 60 tablet 3  . MAGNESIUM PO Take 1 tablet by mouth daily.     . metoprolol tartrate (LOPRESSOR) 25 MG tablet TAKE ONE TABLET BY  MOUTH TWICE A DAY 60 tablet 4  . pravastatin (PRAVACHOL) 20 MG tablet TAKE 1 TABLET(S) BY MOUTH DAILY 90 tablet 2  . sertraline (ZOLOFT) 50 MG tablet TAKE 1 TABLET (50 MG TOTAL) BY MOUTH DAILY. 30 tablet 11   No current facility-administered medications for this visit.    Allergies  Ketek  Electrocardiogram:  SR rate 55 normal  02/18/14  Assessment and Plan

## 2014-10-10 ENCOUNTER — Ambulatory Visit (INDEPENDENT_AMBULATORY_CARE_PROVIDER_SITE_OTHER): Payer: PPO | Admitting: Cardiovascular Disease

## 2014-10-10 VITALS — BP 102/58 | HR 64 | Ht 64.0 in | Wt 133.8 lb

## 2014-10-10 DIAGNOSIS — R7309 Other abnormal glucose: Secondary | ICD-10-CM

## 2014-10-10 DIAGNOSIS — I10 Essential (primary) hypertension: Secondary | ICD-10-CM

## 2014-10-10 DIAGNOSIS — E782 Mixed hyperlipidemia: Secondary | ICD-10-CM

## 2014-10-10 DIAGNOSIS — R7303 Prediabetes: Secondary | ICD-10-CM

## 2014-10-10 DIAGNOSIS — M47812 Spondylosis without myelopathy or radiculopathy, cervical region: Secondary | ICD-10-CM | POA: Insufficient documentation

## 2014-10-10 DIAGNOSIS — M5090 Cervical disc disorder, unspecified, unspecified cervical region: Secondary | ICD-10-CM

## 2014-10-10 NOTE — Assessment & Plan Note (Signed)
Well controlled.  Continue current medications and low sodium Dash type diet.    

## 2014-10-10 NOTE — Assessment & Plan Note (Signed)
Continues to have symptoms in both hands and arms.  Recommended Dr Kary Kos for surgical opinion when time comes  She is leary about getting epidural  Clear from cardiac perspective to have injection or surgery

## 2014-10-10 NOTE — Patient Instructions (Signed)
Your physician wants you to follow-up in: YEAR WITH DR NISHAN  You will receive a reminder letter in the mail two months in advance. If you don't receive a letter, please call our office to schedule the follow-up appointment.  Your physician recommends that you continue on your current medications as directed. Please refer to the Current Medication list given to you today. 

## 2014-10-10 NOTE — Assessment & Plan Note (Signed)
Discussed low carb diet.  Target hemoglobin A1c is 6.5 or less.  Continue current medications.  

## 2014-10-10 NOTE — Assessment & Plan Note (Signed)
Cholesterol is at goal.  Continue current dose of statin and diet Rx.  No myalgias or side effects.  F/U  LFT's in 6 months. Lab Results  Component Value Date   LDLCALC 103* 10/02/2014

## 2014-11-24 ENCOUNTER — Encounter: Payer: Self-pay | Admitting: Physician Assistant

## 2014-11-27 ENCOUNTER — Ambulatory Visit (INDEPENDENT_AMBULATORY_CARE_PROVIDER_SITE_OTHER): Payer: PPO | Admitting: Physician Assistant

## 2014-11-27 VITALS — BP 102/68 | HR 60 | Temp 97.9°F | Resp 16 | Ht 64.0 in | Wt 138.0 lb

## 2014-11-27 DIAGNOSIS — E2839 Other primary ovarian failure: Secondary | ICD-10-CM

## 2014-11-27 DIAGNOSIS — F32A Depression, unspecified: Secondary | ICD-10-CM

## 2014-11-27 DIAGNOSIS — I059 Rheumatic mitral valve disease, unspecified: Secondary | ICD-10-CM

## 2014-11-27 DIAGNOSIS — Z1331 Encounter for screening for depression: Secondary | ICD-10-CM

## 2014-11-27 DIAGNOSIS — R6889 Other general symptoms and signs: Secondary | ICD-10-CM

## 2014-11-27 DIAGNOSIS — F329 Major depressive disorder, single episode, unspecified: Secondary | ICD-10-CM

## 2014-11-27 DIAGNOSIS — M5090 Cervical disc disorder, unspecified, unspecified cervical region: Secondary | ICD-10-CM

## 2014-11-27 DIAGNOSIS — I10 Essential (primary) hypertension: Secondary | ICD-10-CM

## 2014-11-27 DIAGNOSIS — K5732 Diverticulitis of large intestine without perforation or abscess without bleeding: Secondary | ICD-10-CM

## 2014-11-27 DIAGNOSIS — Z9181 History of falling: Secondary | ICD-10-CM

## 2014-11-27 DIAGNOSIS — Z79899 Other long term (current) drug therapy: Secondary | ICD-10-CM

## 2014-11-27 DIAGNOSIS — G4700 Insomnia, unspecified: Secondary | ICD-10-CM | POA: Insufficient documentation

## 2014-11-27 DIAGNOSIS — E782 Mixed hyperlipidemia: Secondary | ICD-10-CM

## 2014-11-27 DIAGNOSIS — R7303 Prediabetes: Secondary | ICD-10-CM

## 2014-11-27 DIAGNOSIS — D259 Leiomyoma of uterus, unspecified: Secondary | ICD-10-CM

## 2014-11-27 DIAGNOSIS — R1900 Intra-abdominal and pelvic swelling, mass and lump, unspecified site: Secondary | ICD-10-CM

## 2014-11-27 DIAGNOSIS — R140 Abdominal distension (gaseous): Secondary | ICD-10-CM

## 2014-11-27 DIAGNOSIS — E559 Vitamin D deficiency, unspecified: Secondary | ICD-10-CM

## 2014-11-27 DIAGNOSIS — Z0001 Encounter for general adult medical examination with abnormal findings: Secondary | ICD-10-CM

## 2014-11-27 DIAGNOSIS — F325 Major depressive disorder, single episode, in full remission: Secondary | ICD-10-CM

## 2014-11-27 LAB — CBC WITH DIFFERENTIAL/PLATELET
Basophils Absolute: 0 10*3/uL (ref 0.0–0.1)
Basophils Relative: 0 % (ref 0–1)
EOS PCT: 2 % (ref 0–5)
Eosinophils Absolute: 0.1 10*3/uL (ref 0.0–0.7)
HCT: 36.6 % (ref 36.0–46.0)
Hemoglobin: 12.4 g/dL (ref 12.0–15.0)
LYMPHS ABS: 2.1 10*3/uL (ref 0.7–4.0)
Lymphocytes Relative: 36 % (ref 12–46)
MCH: 31.2 pg (ref 26.0–34.0)
MCHC: 33.9 g/dL (ref 30.0–36.0)
MCV: 92.2 fL (ref 78.0–100.0)
MONO ABS: 0.5 10*3/uL (ref 0.1–1.0)
MPV: 8.7 fL (ref 8.6–12.4)
Monocytes Relative: 8 % (ref 3–12)
NEUTROS ABS: 3.1 10*3/uL (ref 1.7–7.7)
Neutrophils Relative %: 54 % (ref 43–77)
Platelets: 225 10*3/uL (ref 150–400)
RBC: 3.97 MIL/uL (ref 3.87–5.11)
RDW: 14.1 % (ref 11.5–15.5)
WBC: 5.7 10*3/uL (ref 4.0–10.5)

## 2014-11-27 LAB — HEPATIC FUNCTION PANEL
ALK PHOS: 48 U/L (ref 39–117)
ALT: 14 U/L (ref 0–35)
AST: 16 U/L (ref 0–37)
Albumin: 4.2 g/dL (ref 3.5–5.2)
BILIRUBIN DIRECT: 0.1 mg/dL (ref 0.0–0.3)
BILIRUBIN TOTAL: 0.5 mg/dL (ref 0.2–1.2)
Indirect Bilirubin: 0.4 mg/dL (ref 0.2–1.2)
Total Protein: 6.5 g/dL (ref 6.0–8.3)

## 2014-11-27 LAB — BASIC METABOLIC PANEL WITH GFR
BUN: 10 mg/dL (ref 6–23)
CO2: 24 meq/L (ref 19–32)
Calcium: 9 mg/dL (ref 8.4–10.5)
Chloride: 105 mEq/L (ref 96–112)
Creat: 0.71 mg/dL (ref 0.50–1.10)
GFR, Est Non African American: 88 mL/min
GLUCOSE: 83 mg/dL (ref 70–99)
POTASSIUM: 4.3 meq/L (ref 3.5–5.3)
Sodium: 140 mEq/L (ref 135–145)

## 2014-11-27 NOTE — Progress Notes (Signed)
Medicare Wellness Visit and AB pain Assessment:   1. Essential hypertension - continue medications, DASH diet, exercise and monitor at home. Call if greater than 130/80.  - CBC with Differential/Platelet - BASIC METABOLIC PANEL WITH GFR - Hepatic function panel  2. Prediabetes Discussed general issues about diabetes pathophysiology and management., Educational material distributed., Suggested low cholesterol diet., Encouraged aerobic exercise., Discussed foot care., Reminded to get yearly retinal exam.  3. Mixed hyperlipidemia -continue medications, check lipids, decrease fatty foods, increase activity.   4. Vitamin D deficiency Continue supplement  5. Uterine leiomyoma, unspecified location S/p hysterectomy  6. Cervical disc disease RICE, NSAIDS, exercises given, if not better get xray and PT referral or ortho referral.   7. Diverticulitis of colon No AB pain/rebound tenderness, may need to follow up GI.   8. Mitral valve disorder No SOB, CP, edema, monitor  9. Medication management  10. Screening for depression negative  11. Estrogen deficiency Dexa in 2 years  89. Depression  stress management techniques discussed, increase water, good sleep hygiene discussed, increase exercise, and increase veggies.   13. Insomnia Insomnia- good sleep hygiene discussed, increase day time activity,continue ativan  14.  Abdominal swelling, mass, or lump/ distension 30 pack year smoker, s/p hysterectomy with bilateral oophorectomy, with obvious lower AB distension with possible firm/hard nontender mass, no fluid wave, no PND, edema, orthopnea.  Check LFTs, urine, and get CT AB/Pelvis to evaluate for mass/CA - Urinalysis, Routine w reflex microscopic - Urine culture - CT Abdomen Pelvis Wo Contrast; Future   Plan:   During the course of the visit the patient was educated and counseled about appropriate screening and preventive services including:    Influenza vaccine  Td  vaccine  Screening electrocardiogram  Screening mammography  Bone densitometry screening  Colorectal cancer screening  Diabetes screening  Glaucoma screening  Nutrition counseling   Conditions/risks identified: BMI: Discussed weight loss, diet, and increase physical activity.  Increase physical activity: AHA recommends 150 minutes of physical activity a week.  Medications reviewed DEXA- ordered Diabetes is at goal, ACE/ARB therapy: No, Reason not on Ace Inhibitor/ARB therapy:  No DM anymore Urinary Incontinence is not an issue: discussed non pharmacology and pharmacology options.  Fall risk: low- discussed PT, home fall assessment, medications.   Subjective:   [name] [name] is a 69 y.o. female who presents for Medicare Annual Wellness Visit and stomach pain. Date of last medicare wellness visit was 11/13/2013  Her blood pressure has been controlled at home, today their BP is BP: 102/68 mmHg She does workout.  She denies chest pain, shortness of breath, dizziness.  She is on cholesterol medication and denies myalgias. Her cholesterol is at goal. The cholesterol last visit was:   Lab Results  Component Value Date   CHOL 209* 10/02/2014   HDL 90 10/02/2014   LDLCALC 103* 10/02/2014   TRIG 79 10/02/2014   CHOLHDL 2.3 10/02/2014   She has been working on diet and exercise for prediabetes, and denies paresthesia of the feet, polydipsia, polyuria and visual disturbances. Last A1C in the office was:  Lab Results  Component Value Date   HGBA1C 5.7* 10/02/2014  She is on Ativan for sleep and zoloft for depression.  Patient is on Vitamin D supplement. She always has some AB bloating with milk, green veggies, or with diverticulitis flare however it has been worse the last 3 months. It does get more distended with food any food.  She has had 8 lbs weight gain since Dec  and 5 lbs in 1 month. Denies AB pain, diarrhea, constipation, blood in stool, urine, urinary symptoms, fever,  chills, sweats, SOB, CP. She is s/p hysterectomy with bilateral oophorectomy. Normal CT 2009 other than diverticulitis.  30 pack year smoking history.  Wt Readings from Last 8 Encounters:  11/27/14 138 lb (62.596 kg)  10/10/14 133 lb 12.8 oz (60.691 kg)  10/02/14 133 lb 9.6 oz (60.601 kg)  08/12/14 130 lb 3 oz (59.053 kg)  07/01/14 128 lb 12.8 oz (58.423 kg)  06/03/14 128 lb (58.06 kg)  05/21/14 127 lb 3 oz (57.692 kg)  04/11/14 125 lb (56.7 kg)     Medication Review Current Outpatient Prescriptions on File Prior to Visit  Medication Sig Dispense Refill  . B Complex Vitamins (B COMPLEX PO) Take 1 tablet by mouth daily.     . Cholecalciferol (VITAMIN D3) 5000 UNITS TABS Take 1 tablet by mouth daily.      Marland Kitchen LORazepam (ATIVAN) 1 MG tablet Take 1 tablet (1 mg total) by mouth at bedtime. Take 2 tabs at QHS 60 tablet 3  . MAGNESIUM PO Take 1 tablet by mouth daily.     . metoprolol tartrate (LOPRESSOR) 25 MG tablet TAKE ONE TABLET BY MOUTH TWICE A DAY 60 tablet 4  . pravastatin (PRAVACHOL) 20 MG tablet TAKE 1 TABLET(S) BY MOUTH DAILY 90 tablet 2  . sertraline (ZOLOFT) 50 MG tablet TAKE 1 TABLET (50 MG TOTAL) BY MOUTH DAILY. 30 tablet 11   No current facility-administered medications on file prior to visit.    Current Problems (verified) Patient Active Problem List   Diagnosis Date Noted  . Cervical disc disease 10/10/2014  . Medication management 10/02/2014  . Mixed hyperlipidemia 07/01/2014  . Hypertension   . Prediabetes   . Vitamin D deficiency   . Fibroid   . Mitral valve disorder 06/16/2009  . Palpitations 06/16/2009  . Diverticulitis of colon 05/06/2008    Screening Tests Health Maintenance  Topic Date Due  . ZOSTAVAX  07/20/2007  . PNA vac Low Risk Adult (1 of 2 - PCV13) 07/19/2012  . PAP SMEAR  12/05/2013  . TETANUS/TDAP  12/15/2013  . INFLUENZA VACCINE  03/29/2014  . MAMMOGRAM  01/30/2015  . COLONOSCOPY  10/10/2016  . DEXA SCAN  Completed     Immunization  History  Administered Date(s) Administered  . Pneumococcal Polysaccharide-23 12/22/2004  . Td 12/16/2003    Preventative care: Last colonoscopy: 2008, normal, due 2018 Flex sig 2011 Dr. Deatra Ina Last mammogram: 01/29/2013 [facility] Last pap smear/pelvic exam: 11/2012  DEXA: at OB/GYN- 2010- normal- [facility] Echo 2009 normal EF  Prior vaccinations: TD or Tdap: 2005- ? If due this year  Influenza: declines Pneumococcal: 2006 Prevnar 13: declines Shingles/Zostavax: declines  Names of Other Physician/Practitioners you currently use: 1. [facility]- here for primary care 2. Dr. Sabra Heck, eye doctor, last visit Feb 2016 3. Dr. Yaakov Guthrie, dentist, visit q 4 months Patient Care Team: Unk Pinto, MD as PCP - General (Internal Medicine) Justice Britain, MD as Consulting Physician (Orthopedic Surgery) Iran Planas, MD as Consulting Physician (Orthopedic Surgery) Thornell Sartorius, MD as Consulting Physician (Otolaryngology) Barbaraann Cao, OD as Referring Physician (Optometry) Josue Hector, MD as Consulting Physician (Cardiology) Rana Snare, MD as Consulting Physician (Urology) Anastasio Auerbach, MD as Consulting Physician (Gynecology) Devra Dopp, MD as Referring Physician (Dermatology)  Allergies Allergies  Allergen Reactions  . Ketek [Telithromycin]    Surgical history Past Surgical History  Procedure  Laterality Date  . Tympanoplasty    . Rotator cuff repair      LEFT SHOULDER  . Ectopic pregnancy surgery  1971, 1979  . Hemorrhoid surgery    . Oophorectomy    . Gynecologic cryosurgery  1976  . Abdominal hysterectomy  2000    TAH,BSO endometriosis, leiomyomata  . Rotator cuff repair Right 2/15   Family history Family History  Problem Relation Age of Onset  . Diabetes Mother   . Hypertension Mother   . Cancer Mother     melanoma  . Diabetes Father   . Heart disease Father   . Hyperlipidemia Father   . Hypertension  Father   . Cancer Sister     UTERINE  . Cancer Maternal Grandmother     COLON  . Heart disease Maternal Grandfather   . Heart disease Paternal Grandmother   . Heart disease Paternal Grandfather    Risk Factors: Osteoporosis: postmenopausal estrogen deficiency, dietary calcium and/or vitamin D deficiency and amenorrhea History of fracture in the past year: no  Tobacco History  Substance Use Topics  . Smoking status: Former Smoker -- 0.50 packs/day for 30 years    Types: Cigarettes    Quit date: 05/16/2013  . Smokeless tobacco: Not on file  . Alcohol Use: 0.6 oz/week    1 Standard drinks or equivalent per week     Comment: Occasionally few times per month   She does not smoke.  Patient is a former smoker. Are there smokers in your home (other than you)?  No  Alcohol Current alcohol use: none  Caffeine Current caffeine use: denies use  Exercise Exercise limitations: The patient is experiencing exercise intolerance (resist surgery). Current exercise: walking  Nutrition/Diet Current diet: in general, a "healthy" diet    Cardiac risk factors: advanced age (older than 61 for men, 22 for women), dyslipidemia, hypertension, sedentary lifestyle and smoking/ tobacco exposure.  Depression Screen  Q1: Over the past two weeks, have you felt down, depressed or hopeless? Yes  Q2: Over the past two weeks, have you felt little interest or pleasure in doing things? Yes  Have you lost interest or pleasure in daily life? No  Do you often feel hopeless? No  Do you cry easily over simple problems? No  Activities of Daily Living Nurse ADLs screen reviewed.  In your present state of health, do you have any difficulty performing the following activities?:  Driving? No Managing money?  No Feeding yourself? No Getting from bed to chair? No Climbing a flight of stairs? No Preparing food and eating?: No Bathing or showering? No Getting dressed: No Getting to the toilet? No Using the  toilet:No Moving around from place to place: No In the past year have you fallen or had a near fall?:No   Are you sexually active?  No- husband with MS  Do you have more than one partner?  No  Vision Difficulties: No  Hearing Difficulties: No Do you often ask people to speak up or repeat themselves? No Do you experience ringing or noises in your ears? Yes Do you have difficulty understanding soft or whispered voices? No  Cognition  Do you feel that you have a problem with memory?Yes  Do you often misplace items? No  Do you feel safe at home?  Yes  Advanced directives Does patient have a [place]? Yes Does patient have a Living Will? Yes    Objective:   Blood pressure 102/68, pulse 60, temperature 97.9  F (36.6 C), resp. rate 16, height 5\' 4"  (1.626 m), weight 138 lb (62.596 kg). Body mass index is 23.68 kg/(m^2).  General appearance: alert, no distress, WD/WN,  female Cognitive Testing  Alert? Yes  Normal Appearance?Yes  Oriented to person? Yes  Place? Yes   Time? Yes  Recall of three objects?  No 3/3  Can perform simple calculations? Yes  Displays appropriate judgment?Yes  Can read the correct time from a watch face?Yes  HEENT: normocephalic, sclerae anicteric, TMs pearly, nares patent, no discharge or erythema, pharynx normal Oral cavity: MMM, no lesions Neck: supple, no lymphadenopathy, no thyromegaly, no masses Heart: RRR, bradycardia, slightly holosystolic murmur, normal S1, S2 Lungs: CTA bilaterally, no wheezes, rhonchi, or rales Abdomen: +bs, soft, non tender, + distended and some what firm suprapubic/lower AB without fluid wave,  no hepatomegaly, no splenomegaly Musculoskeletal: nontender, no swelling, no obvious deformity Extremities: no edema, no cyanosis, no clubbing Pulses: 2+ symmetric, upper and lower extremities, normal cap refill Neurological: alert, oriented x 3, CN2-12 intact, strength normal upper extremities and lower  extremities, sensation normal throughout, DTRs 2+ throughout, no cerebellar signs, gait normal Psychiatric: normal affect, behavior normal, pleasant  Breast: defer KWI:[id number] Rectal: defer  Medicare Attestation I have personally reviewed: The patient's medical and social history Their use of alcohol, tobacco or illicit drugs Their current medications and supplements The patient's functional ability including ADLs,fall risks, home safety risks, cognitive, and hearing and visual impairment Diet and physical activities Evidence for depression or mood disorders  The patient's weight, height, BMI, and visual acuity have been recorded in the chart.  I have made referrals, counseling, and provided education to the patient based on review of the above and I have provided the patient with a written personalized care plan for preventive services.     Vicie Mutters, PA-C   11/27/2014

## 2014-11-27 NOTE — Patient Instructions (Signed)
10 Tips on Belching, Bloating, and Flatulence 1. Belching is caused by swallowed air from:  Eating or drinking too fast  Poorly fitting dentures; not chewing food completely  Carbonated beverages  Chewing gum or sucking on hard candies  Excessive swallowing due to nervous tension or postnasal drip  Forced belching to relieve abdominal discomfort 2. To prevent excessive belching, avoid:  Carbonated beverages  Chewing gum  Hard candies  Simethicone/GasX may be helpful  3. Abdominal bloating and discomfort may be due to intestinal sensitivity or symptoms of irritable bowel syndrome. To relieve symptoms, avoid:  Broccoli  Baked beans  Cabbage  Carbonated drinks  Cauliflower  Chewing gum  Hard candy 4. Abdominal distention resulting from weak abdominal muscles:  Is better in the morning  Gets worse as the day progresses  Is relieved by lying down 5. To prevent Abdominal distention:  Tighten abdominal muscles by pulling in your stomach several times during the day  Do sit-up exercises if possible  Wear an abdominal support garment if exercise is too difficult 6. Flatulence is gas created through bacterial action in the bowel and passed rectally. Keep in mind that:  10-18 passages per day are normal  Primary gases are harmless and odorless  Noticeable smells are trace gases related to food intake 7. Foods that are likely to form gas include:  Milk, dairy products, and medications that contain lactose--If your body doesn't produce the enzyme (lactase) to break it down.  Certain vegetables--baked beans, cauliflower, broccoli, cabbage  Certain starches--wheat, oats, corn, potatoes. Rice is a good substitute. 8. Identify offending foods. Reduce or eliminate these gas-forming foods from your diet.  Bloating Bloating is the feeling of fullness in your belly. You may feel as though your pants are too tight. Often the cause of bloating is overeating, retaining fluids, or having gas in your  bowel. It is also caused by swallowing air and eating foods that cause gas. Irritable bowel syndrome is one of the most common causes of bloating. Constipation is also a common cause. Sometimes more serious problems can cause bloating. SYMPTOMS  Usually there is a feeling of fullness, as though your abdomen is bulged out. There may be mild discomfort.  DIAGNOSIS  Usually no particular testing is necessary for most bloating. If the condition persists and seems to become worse, your caregiver may do additional testing.  TREATMENT   There is no direct treatment for bloating.  Do not put gas into the bowel. Avoid chewing gum and sucking on candy. These tend to make you swallow air. Swallowing air can also be a nervous habit. Try to avoid this.  Avoiding high residue diets will help. Eat foods with soluble fibers (examples include root vegetables, apples, or barley) and substitute dairy products with soy and rice products. This helps irritable bowel syndrome.  If constipation is the cause, then a high residue diet with more fiber will help.  Avoid carbonated beverages.  Over-the-counter preparations are available that help reduce gas. Your pharmacist can help you with this. SEEK MEDICAL CARE IF:   Bloating continues and seems to be getting worse.  You notice a weight gain.  You have a weight loss but the bloating is getting worse.  You have changes in your bowel habits or develop nausea or vomiting. SEEK IMMEDIATE MEDICAL CARE IF:   You develop shortness of breath or swelling in your legs.  You have an increase in abdominal pain or develop chest pain. Document Released: 06/15/2006 Document Revised: 11/07/2011 Document  Reviewed: 08/03/2007 ExitCare Patient Information 2015 [place], [place]. This information is not intended to replace advice given to you by your health care provider. Make sure you discuss any questions you have with your health care provider.  9.

## 2014-11-28 LAB — URINALYSIS, ROUTINE W REFLEX MICROSCOPIC
Bilirubin Urine: NEGATIVE
Glucose, UA: NEGATIVE mg/dL
Ketones, ur: NEGATIVE mg/dL
LEUKOCYTES UA: NEGATIVE
Nitrite: NEGATIVE
PROTEIN: NEGATIVE mg/dL
Specific Gravity, Urine: 1.011 (ref 1.005–1.030)
Urobilinogen, UA: 0.2 mg/dL (ref 0.0–1.0)
pH: 6 (ref 5.0–8.0)

## 2014-11-28 LAB — URINALYSIS, MICROSCOPIC ONLY
BACTERIA UA: NONE SEEN
Casts: NONE SEEN
Crystals: NONE SEEN
Squamous Epithelial / LPF: NONE SEEN

## 2014-12-01 ENCOUNTER — Other Ambulatory Visit: Payer: Self-pay | Admitting: Physician Assistant

## 2014-12-01 DIAGNOSIS — R1900 Intra-abdominal and pelvic swelling, mass and lump, unspecified site: Secondary | ICD-10-CM

## 2014-12-01 DIAGNOSIS — R140 Abdominal distension (gaseous): Secondary | ICD-10-CM

## 2014-12-01 LAB — URINE CULTURE: Colony Count: 10000

## 2014-12-01 MED ORDER — SULFAMETHOXAZOLE-TRIMETHOPRIM 800-160 MG PO TABS: 1.0000 | ORAL_TABLET | Freq: Two times a day (BID) | ORAL | Status: AC

## 2014-12-01 NOTE — Addendum Note (Signed)
Addended by: Vicie Mutters R on: 12/01/2014 05:35 PM   Modules accepted: Orders

## 2014-12-02 ENCOUNTER — Ambulatory Visit
Admission: RE | Admit: 2014-12-02 | Discharge: 2014-12-02 | Disposition: A | Discharge status: Home / Self Care | Payer: PPO | Source: Ambulatory Visit | Attending: Physician Assistant | Admitting: Physician Assistant

## 2014-12-02 DIAGNOSIS — R1900 Intra-abdominal and pelvic swelling, mass and lump, unspecified site: Secondary | ICD-10-CM

## 2014-12-02 DIAGNOSIS — R140 Abdominal distension (gaseous): Secondary | ICD-10-CM

## 2014-12-02 MED ORDER — IOPAMIDOL (ISOVUE-300) INJECTION 61%
100.0000 mL | Freq: Once | INTRAVENOUS | Status: AC | PRN
  Administered 2014-12-02: 100 mL via INTRAVENOUS

## 2014-12-06 ENCOUNTER — Other Ambulatory Visit: Payer: Self-pay | Admitting: Internal Medicine

## 2014-12-08 ENCOUNTER — Telehealth: Payer: Self-pay | Admitting: *Deleted

## 2014-12-08 ENCOUNTER — Other Ambulatory Visit: Payer: Self-pay | Admitting: Internal Medicine

## 2014-12-08 DIAGNOSIS — N390 Urinary tract infection, site not specified: Secondary | ICD-10-CM

## 2014-12-08 MED ORDER — NITROFURANTOIN MONOHYD MACRO 100 MG PO CAPS: ORAL_CAPSULE | ORAL | Status: DC

## 2014-12-08 MED ORDER — DOXYCYCLINE HYCLATE 100 MG PO CAPS: 100.0000 mg | ORAL_CAPSULE | Freq: Every day | ORAL | Status: DC

## 2014-12-08 NOTE — Telephone Encounter (Signed)
Patient called pharmacy on 0409/2

## 2014-12-08 NOTE — Telephone Encounter (Signed)
Patient called her pharmacy and reported she was having swelling and redness of her face and eyes after 3-4 days of Bactrim.  Patient stopped med and redness and swelling subsiding.  Patient states she had a fever and headache all weekend but is feeling today.  Informed patient she can call our office ,even on the weekend,  and the answering service will contact Dr Melford Aase to call her.

## 2014-12-08 NOTE — Telephone Encounter (Signed)
Macrobid not covered on the patient's insurance, so OK to send in RX for Doxycycline 100 mg for UTI>

## 2014-12-08 NOTE — Telephone Encounter (Signed)
Patient had reaction to Bactrim and new RX for Macrobid sent to patient's pharmacy for UTI.  Patient aware.

## 2014-12-18 ENCOUNTER — Other Ambulatory Visit: Payer: Self-pay | Admitting: *Deleted

## 2014-12-18 MED ORDER — LORAZEPAM 1 MG PO TABS: ORAL_TABLET | ORAL | Status: DC

## 2015-01-13 ENCOUNTER — Other Ambulatory Visit: Payer: Self-pay | Admitting: Cardiovascular Disease

## 2015-01-21 ENCOUNTER — Other Ambulatory Visit: Payer: Self-pay

## 2015-01-21 ENCOUNTER — Ambulatory Visit (INDEPENDENT_AMBULATORY_CARE_PROVIDER_SITE_OTHER): Payer: PPO | Admitting: Physician Assistant

## 2015-01-21 VITALS — BP 100/60 | HR 64 | Temp 98.1°F | Resp 16 | Wt 136.0 lb

## 2015-01-21 DIAGNOSIS — E559 Vitamin D deficiency, unspecified: Secondary | ICD-10-CM

## 2015-01-21 DIAGNOSIS — I10 Essential (primary) hypertension: Secondary | ICD-10-CM

## 2015-01-21 DIAGNOSIS — Z1211 Encounter for screening for malignant neoplasm of colon: Secondary | ICD-10-CM

## 2015-01-21 DIAGNOSIS — K2100 Gastro-esophageal reflux disease with esophagitis, without bleeding: Secondary | ICD-10-CM

## 2015-01-21 DIAGNOSIS — R7303 Prediabetes: Secondary | ICD-10-CM

## 2015-01-21 DIAGNOSIS — R7309 Other abnormal glucose: Secondary | ICD-10-CM

## 2015-01-21 DIAGNOSIS — E782 Mixed hyperlipidemia: Secondary | ICD-10-CM

## 2015-01-21 DIAGNOSIS — Z79899 Other long term (current) drug therapy: Secondary | ICD-10-CM

## 2015-01-21 LAB — HEPATIC FUNCTION PANEL
ALBUMIN: 4.1 g/dL (ref 3.5–5.2)
ALT: 10 U/L (ref 0–35)
AST: 13 U/L (ref 0–37)
Alkaline Phosphatase: 51 U/L (ref 39–117)
BILIRUBIN INDIRECT: 0.5 mg/dL (ref 0.2–1.2)
Bilirubin, Direct: 0.1 mg/dL (ref 0.0–0.3)
Total Bilirubin: 0.6 mg/dL (ref 0.2–1.2)
Total Protein: 6.7 g/dL (ref 6.0–8.3)

## 2015-01-21 LAB — LIPID PANEL
CHOL/HDL RATIO: 2.4 ratio
CHOLESTEROL: 216 mg/dL — AB (ref 0–200)
HDL: 89 mg/dL (ref >=46)
LDL Cholesterol: 113 mg/dL — ABNORMAL HIGH (ref 0–99)
TRIGLYCERIDES: 72 mg/dL (ref <150)
VLDL: 14 mg/dL (ref 0–40)

## 2015-01-21 LAB — BASIC METABOLIC PANEL WITH GFR
BUN: 14 mg/dL (ref 6–23)
CALCIUM: 9.5 mg/dL (ref 8.4–10.5)
CO2: 27 mEq/L (ref 19–32)
Chloride: 103 mEq/L (ref 96–112)
Creat: 0.7 mg/dL (ref 0.50–1.10)
Glucose, Bld: 86 mg/dL (ref 70–99)
Potassium: 4.4 mEq/L (ref 3.5–5.3)
SODIUM: 140 meq/L (ref 135–145)

## 2015-01-21 LAB — CBC WITH DIFFERENTIAL/PLATELET
BASOS ABS: 0 10*3/uL (ref 0.0–0.1)
Basophils Relative: 0 % (ref 0–1)
EOS PCT: 2 % (ref 0–5)
Eosinophils Absolute: 0.1 10*3/uL (ref 0.0–0.7)
HEMATOCRIT: 35.7 % — AB (ref 36.0–46.0)
HEMOGLOBIN: 12.2 g/dL (ref 12.0–15.0)
LYMPHS ABS: 2.1 10*3/uL (ref 0.7–4.0)
Lymphocytes Relative: 36 % (ref 12–46)
MCH: 31.2 pg (ref 26.0–34.0)
MCHC: 34.2 g/dL (ref 30.0–36.0)
MCV: 91.3 fL (ref 78.0–100.0)
MONOS PCT: 7 % (ref 3–12)
MPV: 8.8 fL (ref 8.6–12.4)
Monocytes Absolute: 0.4 10*3/uL (ref 0.1–1.0)
NEUTROS ABS: 3.2 10*3/uL (ref 1.7–7.7)
Neutrophils Relative %: 55 % (ref 43–77)
PLATELETS: 237 10*3/uL (ref 150–400)
RBC: 3.91 MIL/uL (ref 3.87–5.11)
RDW: 14 % (ref 11.5–15.5)
WBC: 5.9 10*3/uL (ref 4.0–10.5)

## 2015-01-21 LAB — TSH: TSH: 1.294 u[IU]/mL (ref 0.350–4.500)

## 2015-01-21 LAB — HEMOGLOBIN A1C
Hgb A1c MFr Bld: 5.9 % — ABNORMAL HIGH (ref <5.7)
Mean Plasma Glucose: 123 mg/dL — ABNORMAL HIGH (ref <117)

## 2015-01-21 LAB — MAGNESIUM: MAGNESIUM: 2 mg/dL (ref 1.5–2.5)

## 2015-01-21 MED ORDER — RANITIDINE HCL 300 MG PO TABS: ORAL_TABLET | ORAL | Status: DC

## 2015-01-21 NOTE — Progress Notes (Signed)
Assessment and Plan:  1. Hypertension -Continue medication but due to dizziness/hypotension decrease to 1/2 pill BID, monitor blood pressure at home. Continue DASH diet.  Reminder to go to the ER if any CP, SOB, nausea, dizziness, severe HA, changes vision/speech, left arm numbness and tingling and jaw pain.  2. Cholesterol -Continue diet and exercise. Check cholesterol.   3. Prediabetes  -Continue diet and exercise. Check A1C  4. Vitamin D Def - check level and continue medications.   5. GERD Has history of dilatation, will send in zantac 300mg  BID, list of foods, and make appointment Dr. Deatra Ina.    Continue diet and meds as discussed. Further disposition pending results of labs. Over 30 minutes of exam, counseling, chart review, and critical decision making was performed  HPI 69 y.o. female  presents for 3 month follow up on hypertension, cholesterol, prediabetes, and vitamin D deficiency.   Her blood pressure has been controlled at home, today their BP is BP: 100/60 mmHg  She does workout. She denies chest pain, shortness of breath. She has some dizziness occ, has not taken her meds today.   She is on cholesterol medication, pravastatin 40mg  and denies myalgias. Her cholesterol is at goal. The cholesterol last visit was:   Lab Results  Component Value Date   CHOL 209* 10/02/2014   HDL 90 10/02/2014   LDLCALC 103* 10/02/2014   TRIG 79 10/02/2014   CHOLHDL 2.3 10/02/2014    She has been working on diet and exercise for prediabetes, and denies paresthesia of the feet, polydipsia, polyuria and visual disturbances. Last A1C in the office was:  Lab Results  Component Value Date   HGBA1C 5.7* 10/02/2014   Patient is on Vitamin D supplement.   Lab Results  Component Value Date   VD25OH 64 10/02/2014     Last visit, 11/27/2014 she was seen for AB bloating, had CT AB negative 11/2014. Follows with Dr. Deatra Ina.   Current Medications:  Current Outpatient Prescriptions on File  Prior to Visit  Medication Sig Dispense Refill  . B Complex Vitamins (B COMPLEX PO) Take 1 tablet by mouth daily.     . Cholecalciferol (VITAMIN D3) 5000 UNITS TABS Take 1 tablet by mouth daily.      Marland Kitchen LORazepam (ATIVAN) 1 MG tablet Take 1 to 2 tabs at bedtime prn for sleep 60 tablet 5  . MAGNESIUM PO Take 1 tablet by mouth daily.     . metoprolol tartrate (LOPRESSOR) 25 MG tablet TAKE ONE TABLET BY MOUTH TWICE A DAY 60 tablet 4  . metoprolol tartrate (LOPRESSOR) 25 MG tablet TAKE ONE TABLET BY MOUTH TWICE A DAY 60 tablet 5  . pravastatin (PRAVACHOL) 20 MG tablet TAKE 1 TABLET(S) BY MOUTH DAILY 90 tablet 2  . sertraline (ZOLOFT) 50 MG tablet TAKE 1 TABLET (50 MG TOTAL) BY MOUTH DAILY. 30 tablet 11   No current facility-administered medications on file prior to visit.   Medical History:  Past Medical History  Diagnosis Date  . Palpitations   . Mitral valve disorders   . Diverticulitis of colon (without mention of hemorrhage)   . Internal hemorrhoids without mention of complication   . Diverticulosis of colon (without mention of hemorrhage)   . Cervical dysplasia 1976  . Elevated cholesterol   . MRSA infection   . Pelvic adhesive disease   . Hypertension   . Prediabetes   . Vitamin D deficiency    Allergies:  Allergies  Allergen Reactions  . Bactrim [Sulfamethoxazole-Trimethoprim] Swelling  Face and eyes swollen and red.  Marland Kitchen Ketek [Telithromycin]      Review of Systems:  ROS  Family history- Review and unchanged Social history- Review and unchanged Physical Exam: BP 100/60 mmHg  Pulse 64  Temp(Src) 98.1 F (36.7 C)  Resp 16  Wt 136 lb (61.689 kg) Wt Readings from Last 3 Encounters:  01/21/15 136 lb (61.689 kg)  11/27/14 138 lb (62.596 kg)  10/10/14 133 lb 12.8 oz (60.691 kg)   General Appearance: Well nourished, in no apparent distress. Eyes: PERRLA, EOMs, conjunctiva no swelling or erythema Sinuses: No Frontal/maxillary tenderness ENT/Mouth: Ext aud canals  clear, TMs without erythema, bulging. No erythema, swelling, or exudate on post pharynx.  Tonsils not swollen or erythematous. Hearing normal.  Neck: Supple, thyroid normal.  Respiratory: Respiratory effort normal, BS equal bilaterally without rales, rhonchi, wheezing or stridor.  Cardio: RRR with no MRGs. Brisk peripheral pulses without edema.  Abdomen: Soft, + BS,  Non tender, no guarding, rebound, hernias, masses. Lymphatics: Non tender without lymphadenopathy.  Musculoskeletal: Full ROM, 5/5 strength, Normal gait Skin: Warm, dry without rashes, lesions, ecchymosis.  Neuro: Cranial nerves intact. Normal muscle tone, no cerebellar symptoms. Psych: Awake and oriented X 3, normal affect, Insight and Judgment appropriate.    Vicie Mutters, PA-C 1:41 PM [facility]

## 2015-01-21 NOTE — Patient Instructions (Addendum)
Add ENTERIC COATED low dose 81 mg Aspirin daily OR can do every other day if you have easy bruising to protect your heart and head. As well as to reduce risk of Colon Cancer by 20 %, Skin Cancer by 26 % , Melanoma by 46% and Pancreatic cancer by 60%  Please pick one of the over the counter allergy medications below and take it once daily for allergies.  Claritin or loratadine cheapest but likely the weakest  Zyrtec or certizine at night because it can make you sleepy The strongest is allegra or fexafinadine  Cheapest at [facility], [facility], [facility]   Take zantac 150-300 mg at night for 2 weeks, then you can stop.  Avoid alcohol/wine, spicy foods, NSAIDS (aleve, ibuprofen) at this time. See foods below.   Food Choices for Gastroesophageal Reflux Disease When you have gastroesophageal reflux disease (GERD), the foods you eat and your eating habits are very important. Choosing the right foods can help ease the discomfort of GERD. WHAT GENERAL GUIDELINES DO I NEED TO FOLLOW?  Choose fruits, vegetables, whole grains, low-fat dairy products, and low-fat meat, fish, and poultry.  Limit fats such as oils, salad dressings, butter, nuts, and avocado.  Keep a food diary to identify foods that cause symptoms.  Avoid foods that cause reflux. These may be different for different people.  Eat frequent small meals instead of three large meals each day.  Eat your meals slowly, in a relaxed setting.  Limit fried foods.  Cook foods using methods other than frying.  Avoid drinking alcohol.  Avoid drinking large amounts of liquids with your meals.  Avoid bending over or lying down until 2-3 hours after eating. WHAT FOODS ARE NOT RECOMMENDED? The following are some foods and drinks that may worsen your symptoms: Vegetables Tomatoes. Tomato juice. Tomato and spaghetti sauce. Chili peppers. Onion and garlic. Horseradish. Fruits Oranges, grapefruit, and lemon (fruit and juice). Meats High-fat meats,  fish, and poultry. This includes hot dogs, ribs, ham, sausage, salami, and bacon. Dairy Whole milk and chocolate milk. Sour cream. Cream. Butter. Ice cream. Cream cheese.  Beverages Coffee and tea, with or without caffeine. Carbonated beverages or energy drinks. Condiments Hot sauce. Barbecue sauce.  Sweets/Desserts Chocolate and cocoa. Donuts. Peppermint and spearmint. Fats and Oils High-fat foods, including [place] fries and potato chips. Other Vinegar. Strong spices, such as black pepper, white pepper, red pepper, cayenne, curry powder, cloves, ginger, and chili powder.   10 Tips on Belching, Bloating, and Flatulence 1. Belching is caused by swallowed air from:  Eating or drinking too fast  Poorly fitting dentures; not chewing food completely  Carbonated beverages  Chewing gum or sucking on hard candies  Excessive swallowing due to nervous tension or postnasal drip  Forced belching to relieve abdominal discomfort 2. To prevent excessive belching, avoid:  Carbonated beverages  Chewing gum  Hard candies  Simethicone/GasX may be helpful  3. Abdominal bloating and discomfort may be due to intestinal sensitivity or symptoms of irritable bowel syndrome. To relieve symptoms, avoid:  Broccoli  Baked beans  Cabbage  Carbonated drinks  Cauliflower  Chewing gum  Hard candy 4. Abdominal distention resulting from weak abdominal muscles:  Is better in the morning  Gets worse as the day progresses  Is relieved by lying down 5. To prevent Abdominal distention:  Tighten abdominal muscles by pulling in your stomach several times during the day  Do sit-up exercises if possible  Wear an abdominal support garment if exercise is too difficult 6.  Flatulence is gas created through bacterial action in the bowel and passed rectally. Keep in mind that:  10-18 passages per day are normal  Primary gases are harmless and odorless  Noticeable smells are trace gases related to food intake 7. Foods  that are likely to form gas include:  Milk, dairy products, and medications that contain lactose--If your body doesn't produce the enzyme (lactase) to break it down.  Certain vegetables--baked beans, cauliflower, broccoli, cabbage  Certain starches--wheat, oats, corn, potatoes. Rice is a good substitute. 8. Identify offending foods. Reduce or eliminate these gas-forming foods from your diet. 9.

## 2015-01-22 LAB — VITAMIN D 25 HYDROXY (VIT D DEFICIENCY, FRACTURES): VIT D 25 HYDROXY: 60 ng/mL (ref 30–100)

## 2015-02-25 ENCOUNTER — Ambulatory Visit (INDEPENDENT_AMBULATORY_CARE_PROVIDER_SITE_OTHER): Payer: PPO | Admitting: Physician Assistant

## 2015-02-25 ENCOUNTER — Other Ambulatory Visit: Payer: PPO

## 2015-02-25 ENCOUNTER — Encounter: Payer: Self-pay | Admitting: Emergency Medicine

## 2015-02-25 ENCOUNTER — Encounter: Payer: Self-pay | Admitting: Physician Assistant

## 2015-02-25 VITALS — BP 104/60 | HR 66 | Ht 64.0 in | Wt 137.6 lb

## 2015-02-25 DIAGNOSIS — K219 Gastro-esophageal reflux disease without esophagitis: Secondary | ICD-10-CM | POA: Diagnosis not present

## 2015-02-25 DIAGNOSIS — K209 Esophagitis, unspecified without bleeding: Secondary | ICD-10-CM

## 2015-02-25 MED ORDER — OMEPRAZOLE 40 MG PO CPDR: 40.0000 mg | DELAYED_RELEASE_CAPSULE | Freq: Every day | ORAL | Status: DC

## 2015-02-25 NOTE — Patient Instructions (Signed)
Please go to the basement level to have your labs drawn.  We sent a prescription for Omeprazole ( Prilosec) 40 mg. To [facility].  Take 1 tab twice daily for 14 days then go to once daily.  Take a daily probiotic. Try Align, Culturelle or [employer].

## 2015-02-25 NOTE — Progress Notes (Signed)
Patient ID: [name] [name], female   DOB: [birth year], 69 y.o.   MRN: [id number]   Subjective:    Patient ID: [name] [name], female    DOB: [birth year], 69 y.o.   MRN: [id number]  HPI [name] [name] is a pleasant 69 year old white female known to Dr. Deatra Ina. She has history of diverticular disease, internal hemorrhoids and a remote history of EGD in 1998 with distal stricture which was dilated to #50 [place]. Patient comes in today with complaints of the xiphoid pain and nausea. She says she had not required any acid blocker over the past many years and generally had not been bothered by heartburn or indigestion. She says her current symptoms started in early May 2016 and have not progressed. She was seen by her PCP Dr. Deberah Pelton and started on Zantac twice daily which she has not found helpful at all. She describes a burning sensation in her lower chest and discomfort which is been present most of the day. She says she had one nighttime episode which woke her from sleep. She says she's feeling nauseated early in the mornings and sometimes feels better after she eats. She's also had any increase in belching burping and hiccups. No true dysphagia or odynophagia. She says because of her symptoms she's been somewhat afraid to eat area she says sometimes she is aware of liquids going down her esophagus but really does not feel pain. She has not been on any regular aspirin or NSAIDs no recent antibiotics and no new medications supplements vitamins etc. CT scan of the abdomen and pelvis done for 2016 was unremarkable. Last colonoscopy 2008 diverticulosis only and grade 3 internal hemorrhoids. She had flexible sigmoidoscopy since then in 2011 at which time she was persistent participating in drug study and this showed diverticulosis.  Review of Systems Pertinent positive and negative review of systems were noted in the above HPI section.  All other review of systems was otherwise negative.  Outpatient  Encounter Prescriptions as of 02/25/2015  Medication Sig  . B Complex Vitamins (B COMPLEX PO) Take 1 tablet by mouth daily.   . Cholecalciferol (VITAMIN D3) 5000 UNITS TABS Take 1 tablet by mouth daily.    Marland Kitchen LORazepam (ATIVAN) 1 MG tablet Take 1 to 2 tabs at bedtime prn for sleep  . MAGNESIUM PO Take 1 tablet by mouth daily.   . metoprolol tartrate (LOPRESSOR) 25 MG tablet TAKE ONE TABLET BY MOUTH TWICE A DAY  . pravastatin (PRAVACHOL) 20 MG tablet TAKE 1 TABLET(S) BY MOUTH DAILY  . ranitidine (ZANTAC) 300 MG tablet Take twice a day for a week and then once at night.  . sertraline (ZOLOFT) 50 MG tablet TAKE 1 TABLET (50 MG TOTAL) BY MOUTH DAILY.  Marland Kitchen omeprazole (PRILOSEC) 40 MG capsule Take 1 capsule (40 mg total) by mouth daily.  . [DISCONTINUED] metoprolol tartrate (LOPRESSOR) 25 MG tablet TAKE ONE TABLET BY MOUTH TWICE A DAY   No facility-administered encounter medications on file as of 02/25/2015.   Allergies  Allergen Reactions  . Bactrim [Sulfamethoxazole-Trimethoprim] Swelling    Face and eyes swollen and red.  Marland Kitchen Ketek [Telithromycin]    Patient Active Problem List   Diagnosis Date Noted  . Depression 11/27/2014  . Insomnia 11/27/2014  . Cervical disc disease 10/10/2014  . Medication management 10/02/2014  . Mixed hyperlipidemia 07/01/2014  . Hypertension   . Prediabetes   . Vitamin D deficiency   . Fibroid   . Mitral valve disorder 06/16/2009  . Diverticulitis of colon  05/06/2008   History   Social History  . Marital Status: Married    Spouse Name: N/A  . Number of Children: 0  . Years of Education: N/A   Occupational History  . retired    Social History Main Topics  . Smoking status: Former Smoker -- 0.50 packs/day for 30 years    Types: Cigarettes    Quit date: 05/16/2013  . Smokeless tobacco: Never Used  . Alcohol Use: 0.6 oz/week    1 Standard drinks or equivalent per week     Comment: Occasionally few times per month  . Drug Use: No  . Sexual Activity:  Yes    Birth Control/ Protection: Surgical   Other Topics Concern  . Not on file   Social History Narrative   She lives with husband.  They do not have children.   She previously worked at [employer].   Highest level of education:  High school     Ms. Odoherty's family history includes Cancer in her mother and sister; Colon cancer in her maternal grandmother; Diabetes in her father and mother; Heart disease in her father, maternal grandfather, paternal grandfather, and paternal grandmother; Hyperlipidemia in her father; Hypertension in her father and mother.      Objective:    Filed Vitals:   02/25/15 0832  BP: 104/60  Pulse: 66    Physical Exam  well-developed older white female in no acute distress, pleasant blood pressure 104/60 pulse 66 height 5 foot 4 weight 137. HEENT nontraumatic normocephalic EOMI PERRLA sclera anicteric, Supple no JVD, Cardiovascular regular rate and rhythm with S1-S2 no murmur or gallop, Pulmonary clear bilaterally, Abdomen soft nontender nondistended bowel sounds are active there is no palpable mass or hepatosplenomegaly bowel sounds are present, Rectal exam not done, [place] is no clubbing cyanosis or edema skin warm and dry, Psych mood and affect appropriate       Assessment & Plan:   #1 69 yo female with  One month hx of subxyphoid burning type pain, and early morning nausea. I suspect sxs secondary to GERD and  Esophagitis . GB  Was normal on CT #2 Diverticulosis  Plan; Antireflux regimen Start Omeprazole 40 mg po BID x 2 weeks then 40mg  qam Pt will call in one 2-3 weeks with progress report - if sxs nor improving then will need EGD with Dr Winfield Cunas PA-C 02/25/2015   Cc: Unk Pinto, MD

## 2015-02-26 NOTE — Progress Notes (Signed)
Reviewed and agree with management. Dontai Pember D. Johari Pinney, M.D., FACG  

## 2015-02-27 LAB — H PYLORI, IGM, IGG, IGA AB
H Pylori IgG: <0.9 U/mL (ref 0.0–0.8)
H. pylori, IgA Abs: <9 units (ref 0.0–8.9)
H. pylori, IgM Abs: <9 units (ref 0.0–8.9)

## 2015-03-03 ENCOUNTER — Other Ambulatory Visit: Payer: Self-pay

## 2015-03-09 ENCOUNTER — Telehealth: Payer: Self-pay

## 2015-03-09 DIAGNOSIS — K2100 Gastro-esophageal reflux disease with esophagitis, without bleeding: Secondary | ICD-10-CM

## 2015-03-09 NOTE — Telephone Encounter (Signed)
She was seen 02/25/15 for subxyphoid burning type pain, and early morning nausea. She started on the antireflux regimen and also Omeprazole 40 mg po BID x 2 weeks then 40mg  qam Pt will call in one 2-3 weeks with progress report - if sxs nor improving then will need EGD with Dr Deatra Ina . She is calling to report her symptoms have not improved. She cannot tell any difference. She can get a very temporary relef by eating, but the relief is very brief.  Please advise.

## 2015-03-10 ENCOUNTER — Other Ambulatory Visit: Payer: Self-pay

## 2015-03-10 MED ORDER — SUCRALFATE 1 GM/10ML PO SUSP: 1.0000 g | Freq: Four times a day (QID) | ORAL | Status: DC

## 2015-03-10 NOTE — Telephone Encounter (Signed)
Patient advised and she agrees to this plan of care. Rx to [facility].

## 2015-03-10 NOTE — Telephone Encounter (Signed)
Ok- please schedule for EGD with Dr . [name] [name]. She can go back to once qam with prilosec 40 mg and lat her try Carafate 1gm liquid between meals ans at bedtime

## 2015-03-12 ENCOUNTER — Telehealth: Payer: Self-pay

## 2015-03-12 NOTE — Telephone Encounter (Signed)
Patient scheduled.

## 2015-03-12 NOTE — Telephone Encounter (Signed)
-----   Message from Osvaldo Angst, CRNA sent at 03/11/2015  5:46 PM EDT ----- Yes In the future please refer to the ASA IV list of exceptions  Thanks,  John ----- Message -----    From: Greggory Keen, LPN    Sent: 1/51/7616   2:38 PM      To: Osvaldo Angst, CRNA  This patient need an EGD. She has MVP. Is she still a candidate for [facility]?

## 2015-03-12 NOTE — Telephone Encounter (Signed)
EGD scheduled for 03/27/15. Her pre-visit will be tomorrow at 1:00.

## 2015-03-13 ENCOUNTER — Ambulatory Visit (AMBULATORY_SURGERY_CENTER): Payer: Self-pay | Admitting: *Deleted

## 2015-03-13 VITALS — Ht 64.0 in | Wt 126.6 lb

## 2015-03-13 DIAGNOSIS — K2100 Gastro-esophageal reflux disease with esophagitis, without bleeding: Secondary | ICD-10-CM

## 2015-03-13 NOTE — Progress Notes (Signed)
No egg or soy allergy No issues with past sedation. Pt states she has hx of post op N/V in the past and she thinks she gets medicated prior to for this  No home 02 No diet pills emmi video declined

## 2015-03-27 ENCOUNTER — Ambulatory Visit (AMBULATORY_SURGERY_CENTER): Payer: PPO | Admitting: Gastroenterology

## 2015-03-27 ENCOUNTER — Encounter: Payer: Self-pay | Admitting: Gastroenterology

## 2015-03-27 VITALS — BP 105/39 | HR 59 | Temp 97.9°F | Resp 18 | Ht 64.0 in | Wt 126.0 lb

## 2015-03-27 DIAGNOSIS — K222 Esophageal obstruction: Secondary | ICD-10-CM

## 2015-03-27 DIAGNOSIS — K2100 Gastro-esophageal reflux disease with esophagitis, without bleeding: Secondary | ICD-10-CM

## 2015-03-27 MED ORDER — SODIUM CHLORIDE 0.9 % IV SOLN: 500.0000 mL | INTRAVENOUS | Status: DC

## 2015-03-27 MED ORDER — DEXLANSOPRAZOLE 60 MG PO CPDR: 60.0000 mg | DELAYED_RELEASE_CAPSULE | Freq: Every day | ORAL | Status: DC

## 2015-03-27 NOTE — Progress Notes (Signed)
A/ox3 pleased with MAC, report to Wendy RN 

## 2015-03-27 NOTE — Op Note (Addendum)
[facility]  [address] [place], [id number]   ENDOSCOPY PROCEDURE REPORT  PATIENT: [name] [name], [name] [name]  MR#: [id number] BIRTHDATE: 1946/06/22 , 80  yrs. old GENDER: female ENDOSCOPIST: Inda Castle, MD REFERRED BY:  Unk Pinto, M.D. PROCEDURE DATE:  03/27/2015 PROCEDURE:  EGD, diagnostic and Maloney dilation of esophagus ASA CLASS:     Class II INDICATIONS:  heartburn and dysphagia. MEDICATIONS: Monitored anesthesia care and Propofol 100 mg IV TOPICAL ANESTHETIC:  DESCRIPTION OF PROCEDURE: After the risks benefits and alternatives of the procedure were thoroughly explained, informed consent was obtained.  The LB [id number] endoscope was introduced through the mouth and advanced to the second portion of the duodenum , Without limitations.  The instrument was slowly withdrawn as the mucosa was fully examined.    ESOPHAGUS: There was a peptic stricture in the lower third of the esophagus.  The stricture was traversable.  The stricture was dilated using a 80mm (52Fr) Maloney dilator.  There was mild resistance and no heme. Except for the findings listed, the EGD was otherwise normal.  Retroflexed views revealed no abnormalities. The scope was then withdrawn from the patient and the procedure completed.  COMPLICATIONS: There were no immediate complications.  ENDOSCOPIC IMPRESSION: 1.   There was a stricture in the lower third of the esophagus; The stricture was dilated using a 62mm (52Fr) Maloney dilator 2.   EGD was otherwise normal  RECOMMENDATIONS: Patient will consider enrollment in a GERD study.  Otherwise will begin dexilant 60 mg daily  REPEAT EXAM:  eSigned:  Inda Castle, MD 03/27/2015 2:44 PM Revised: 03/27/2015 2:44 PM   CC:

## 2015-03-27 NOTE — Progress Notes (Signed)
Called to room to assist during endoscopic procedure.  Patient ID and intended procedure confirmed with present staff. Received instructions for my participation in the procedure from the performing physician.  

## 2015-03-27 NOTE — Patient Instructions (Addendum)
YOU HAD AN ENDOSCOPIC PROCEDURE TODAY AT [facility]:   Refer to the procedure report that was given to you for any specific questions about what was found during the examination.  If the procedure report does not answer your questions, please call your gastroenterologist to clarify.  If you requested that your care partner not be given the details of your procedure findings, then the procedure report has been included in a sealed envelope for you to review at your convenience later.  YOU SHOULD EXPECT: Some feelings of bloating in the abdomen. Passage of more gas than usual.  Walking can help get rid of the air that was put into your GI tract during the procedure and reduce the bloating. If you had a lower endoscopy (such as a colonoscopy or flexible sigmoidoscopy) you may notice spotting of blood in your stool or on the toilet paper. If you underwent a bowel prep for your procedure, you may not have a normal bowel movement for a few days.  Please Note:  You might notice some irritation and congestion in your nose or some drainage.  This is from the oxygen used during your procedure.  There is no need for concern and it should clear up in a day or so.  SYMPTOMS TO REPORT IMMEDIATELY:   Following upper endoscopy (EGD)  Vomiting of blood or coffee ground material  New chest pain or pain under the shoulder blades  Painful or persistently difficult swallowing  New shortness of breath  Fever of 100F or higher  Black, tarry-looking stools  For urgent or emergent issues, a gastroenterologist can be reached at any hour by calling [phone].   DIET: Your first meal following the procedure should be a small meal and then it is ok to progress to your normal diet. Heavy or fried foods are harder to digest and may make you feel nauseous or bloated.  Likewise, meals heavy in dairy and vegetables can increase bloating.  Drink plenty of fluids but you should avoid alcoholic beverages for  24 hours.  ACTIVITY:  You should plan to take it easy for the rest of today and you should NOT DRIVE or use heavy machinery until tomorrow (because of the sedation medicines used during the test).    FOLLOW UP: Our staff will call the number listed on your records the next business day following your procedure to check on you and address any questions or concerns that you may have regarding the information given to you following your procedure. If we do not reach you, we will leave a message.  However, if you are feeling well and you are not experiencing any problems, there is no need to return our call.  We will assume that you have returned to your regular daily activities without incident.  If any biopsies were taken you will be contacted by phone or by letter within the next 1-3 weeks.  Please call us at [phone] if you have not heard about the biopsies in 3 weeks.    SIGNATURES/CONFIDENTIALITY: You and/or your care partner have signed paperwork which will be entered into your electronic medical record.  These signatures attest to the fact that that the information above on your After Visit Summary has been reviewed and is understood.  Full responsibility of the confidentiality of this discharge information lies with you and/or your care-partner.  Please follow dilation diet instructions given and review handout provided.

## 2015-03-30 ENCOUNTER — Telehealth: Payer: Self-pay

## 2015-03-30 MED ORDER — DEXLANSOPRAZOLE 60 MG PO CPDR: 60.0000 mg | DELAYED_RELEASE_CAPSULE | Freq: Every day | ORAL | Status: DC

## 2015-03-30 NOTE — Telephone Encounter (Signed)
  Follow up Call-  Call back number 03/27/2015  Post procedure Call Back phone  # [phone]  Permission to leave phone message Yes     Patient questions:  Do you have a fever, pain , or abdominal swelling? No. Pain Score  0 *  Have you tolerated food without any problems? Yes.    Have you been able to return to your normal activities? Yes.    Do you have any questions about your discharge instructions: Diet   No. Medications  No. Follow up visit  No.  Do you have questions or concerns about your Care? No.  Actions: * If pain score is 4 or above: No action needed, pain <4.

## 2015-03-30 NOTE — Telephone Encounter (Signed)
-----   Message from Inda Castle, MD sent at 03/30/2015 11:47 AM EDT ----- Regarding: FW: Research Review Please start Ms. [place] on dexilant 60 mg daily.  She should call back to report her progress in approximately 2-3 weeks ----- Message -----    From: Otho Perl    Sent: 03/30/2015  10:46 AM      To: Inda Castle, MD Subject: Research Review                                Reviewed patient for the GERD study while patient was in the office for her procedure instructions. Margaretha Sheffield does not qualify for the study because she is taking a beta blocker, recent stricture, and has not been on the standard dose of current medication for 8 weeks.   Riki Sheer, LPN

## 2015-03-30 NOTE — Telephone Encounter (Signed)
Script sent to pharmacy.

## 2015-04-27 ENCOUNTER — Ambulatory Visit (INDEPENDENT_AMBULATORY_CARE_PROVIDER_SITE_OTHER): Payer: PPO | Admitting: Physician Assistant

## 2015-04-27 ENCOUNTER — Encounter: Payer: Self-pay | Admitting: Physician Assistant

## 2015-04-27 VITALS — BP 104/70 | HR 68 | Temp 98.4°F | Resp 16 | Ht 64.0 in | Wt 135.4 lb

## 2015-04-27 DIAGNOSIS — F329 Major depressive disorder, single episode, unspecified: Secondary | ICD-10-CM | POA: Diagnosis not present

## 2015-04-27 DIAGNOSIS — R7309 Other abnormal glucose: Secondary | ICD-10-CM

## 2015-04-27 DIAGNOSIS — Z789 Other specified health status: Secondary | ICD-10-CM | POA: Diagnosis not present

## 2015-04-27 DIAGNOSIS — Z0000 Encounter for general adult medical examination without abnormal findings: Secondary | ICD-10-CM | POA: Insufficient documentation

## 2015-04-27 DIAGNOSIS — E559 Vitamin D deficiency, unspecified: Secondary | ICD-10-CM

## 2015-04-27 DIAGNOSIS — I059 Rheumatic mitral valve disease, unspecified: Secondary | ICD-10-CM

## 2015-04-27 DIAGNOSIS — M5090 Cervical disc disorder, unspecified, unspecified cervical region: Secondary | ICD-10-CM

## 2015-04-27 DIAGNOSIS — D259 Leiomyoma of uterus, unspecified: Secondary | ICD-10-CM

## 2015-04-27 DIAGNOSIS — Z6823 Body mass index (BMI) 23.0-23.9, adult: Secondary | ICD-10-CM

## 2015-04-27 DIAGNOSIS — F32A Depression, unspecified: Secondary | ICD-10-CM

## 2015-04-27 DIAGNOSIS — Z23 Encounter for immunization: Secondary | ICD-10-CM

## 2015-04-27 DIAGNOSIS — I10 Essential (primary) hypertension: Secondary | ICD-10-CM

## 2015-04-27 DIAGNOSIS — G4700 Insomnia, unspecified: Secondary | ICD-10-CM

## 2015-04-27 DIAGNOSIS — R7303 Prediabetes: Secondary | ICD-10-CM

## 2015-04-27 DIAGNOSIS — Z1331 Encounter for screening for depression: Secondary | ICD-10-CM

## 2015-04-27 DIAGNOSIS — Z9181 History of falling: Secondary | ICD-10-CM

## 2015-04-27 DIAGNOSIS — Z79899 Other long term (current) drug therapy: Secondary | ICD-10-CM

## 2015-04-27 DIAGNOSIS — K5732 Diverticulitis of large intestine without perforation or abscess without bleeding: Secondary | ICD-10-CM

## 2015-04-27 DIAGNOSIS — E782 Mixed hyperlipidemia: Secondary | ICD-10-CM

## 2015-04-27 DIAGNOSIS — Z0001 Encounter for general adult medical examination with abnormal findings: Secondary | ICD-10-CM

## 2015-04-27 LAB — CBC WITH DIFFERENTIAL/PLATELET
Basophils Absolute: 0 10*3/uL (ref 0.0–0.1)
Basophils Relative: 0 % (ref 0–1)
EOS PCT: 4 % (ref 0–5)
Eosinophils Absolute: 0.3 10*3/uL (ref 0.0–0.7)
HEMATOCRIT: 36.1 % (ref 36.0–46.0)
HEMOGLOBIN: 12.5 g/dL (ref 12.0–15.0)
LYMPHS ABS: 2 10*3/uL (ref 0.7–4.0)
Lymphocytes Relative: 30 % (ref 12–46)
MCH: 31.8 pg (ref 26.0–34.0)
MCHC: 34.6 g/dL (ref 30.0–36.0)
MCV: 91.9 fL (ref 78.0–100.0)
MPV: 8.6 fL (ref 8.6–12.4)
Monocytes Absolute: 0.4 10*3/uL (ref 0.1–1.0)
Monocytes Relative: 6 % (ref 3–12)
NEUTROS ABS: 4.1 10*3/uL (ref 1.7–7.7)
NEUTROS PCT: 60 % (ref 43–77)
Platelets: 247 10*3/uL (ref 150–400)
RBC: 3.93 MIL/uL (ref 3.87–5.11)
RDW: 14.5 % (ref 11.5–15.5)
WBC: 6.8 10*3/uL (ref 4.0–10.5)

## 2015-04-27 MED ORDER — SERTRALINE HCL 100 MG PO TABS: 100.0000 mg | ORAL_TABLET | Freq: Every day | ORAL | Status: DC

## 2015-04-27 NOTE — Patient Instructions (Addendum)
Increase the zoloft to 100 mg daily, this can take 2-4 weeks to feel a difference, but at any time you can cut back to 56m, just do not abruptly stop this medication.   You can cut your metoprolol in half either in the morning or at night, monitor BP/HR.   Preventive Care for Adults A healthy lifestyle and preventive care can promote health and wellness. Preventive health guidelines for women include the following key practices.  A routine yearly physical is a good way to check with your health care provider about your health and preventive screening. It is a chance to share any concerns and updates on your health and to receive a thorough exam.  Visit your dentist for a routine exam and preventive care every 6 months. Brush your teeth twice a day and floss once a day. Good oral hygiene prevents tooth decay and gum disease.  The frequency of eye exams is based on your age, health, family medical history, use of contact lenses, and other factors. Follow your health care provider's recommendations for frequency of eye exams.  Eat a healthy diet. Foods like vegetables, fruits, whole grains, low-fat dairy products, and lean protein foods contain the nutrients you need without too many calories. Decrease your intake of foods high in solid fats, added sugars, and salt. Eat the right amount of calories for you.Get information about a proper diet from your health care provider, if necessary.  Regular physical exercise is one of the most important things you can do for your health. Most adults should get at least 150 minutes of moderate-intensity exercise (any activity that increases your heart rate and causes you to sweat) each week. In addition, most adults need muscle-strengthening exercises on 2 or more days a week.  Maintain a healthy weight. The body mass index (BMI) is a screening tool to identify possible weight problems. It provides an estimate of body fat based on height and weight. Your health  care provider can find your BMI and can help you achieve or maintain a healthy weight.For adults 20 years and older:  A BMI below 18.5 is considered underweight.  A BMI of 18.5 to 24.9 is normal.  A BMI of 25 to 29.9 is considered overweight.  A BMI of 30 and above is considered obese.  Maintain normal blood lipids and cholesterol levels by exercising and minimizing your intake of saturated fat. Eat a balanced diet with plenty of fruit and vegetables. If your lipid or cholesterol levels are high, you are over 50, or you are at high risk for heart disease, you may need your cholesterol levels checked more frequently.Ongoing high lipid and cholesterol levels should be treated with medicines if diet and exercise are not working.  If you smoke, find out from your health care provider how to quit. If you do not use tobacco, do not start.  Lung cancer screening is recommended for adults aged 528-80years who are at high risk for developing lung cancer because of a history of smoking. A yearly low-dose CT scan of the lungs is recommended for people who have at least a 30-pack-year history of smoking and are a current smoker or have quit within the past 15 years. A pack year of smoking is smoking an average of 1 pack of cigarettes a day for 1 year (for example: 1 pack a day for 30 years or 2 packs a day for 15 years). Yearly screening should continue until the smoker has stopped smoking for at least  15 years. Yearly screening should be stopped for people who develop a health problem that would prevent them from having lung cancer treatment.  Avoid use of street drugs. Do not share needles with anyone. Ask for help if you need support or instructions about stopping the use of drugs.  High blood pressure causes heart disease and increases the risk of stroke.  Ongoing high blood pressure should be treated with medicines if weight loss and exercise do not work.  If you are 60-37 years old, ask your health  care provider if you should take aspirin to prevent strokes.  Diabetes screening involves taking a blood sample to check your fasting blood sugar level. This should be done once every 3 years, after age 79, if you are within normal weight and without risk factors for diabetes. Testing should be considered at a younger age or be carried out more frequently if you are overweight and have at least 1 risk factor for diabetes.  Breast cancer screening is essential preventive care for women. You should practice "breast self-awareness." This means understanding the normal appearance and feel of your breasts and may include breast self-examination. Any changes detected, no matter how small, should be reported to a health care provider. Women in their 60s and 30s should have a clinical breast exam (CBE) by a health care provider as part of a regular health exam every 1 to 3 years. After age 107, women should have a CBE every year. Starting at age 46, women should consider having a mammogram (breast X-ray test) every year. Women who have a family history of breast cancer should talk to their health care provider about genetic screening. Women at a high risk of breast cancer should talk to their health care providers about having an MRI and a mammogram every year.  Breast cancer gene (BRCA)-related cancer risk assessment is recommended for women who have family members with BRCA-related cancers. BRCA-related cancers include breast, ovarian, tubal, and peritoneal cancers. Having family members with these cancers may be associated with an increased risk for harmful changes (mutations) in the breast cancer genes BRCA1 and BRCA2. Results of the assessment will determine the need for genetic counseling and BRCA1 and BRCA2 testing.  Routine pelvic exams to screen for cancer are no longer recommended for nonpregnant women who are considered low risk for cancer of the pelvic organs (ovaries, uterus, and vagina) and who do not  have symptoms. Ask your health care provider if a screening pelvic exam is right for you.  If you have had past treatment for cervical cancer or a condition that could lead to cancer, you need Pap tests and screening for cancer for at least 20 years after your treatment. If Pap tests have been discontinued, your risk factors (such as having a new sexual partner) need to be reassessed to determine if screening should be resumed. Some women have medical problems that increase the chance of getting cervical cancer. In these cases, your health care provider may recommend more frequent screening and Pap tests.    Colorectal cancer can be detected and often prevented. Most routine colorectal cancer screening begins at the age of 21 years and continues through age 69 years. However, your health care provider may recommend screening at an earlier age if you have risk factors for colon cancer. On a yearly basis, your health care provider may provide home test kits to check for hidden blood in the stool. Use of a small camera at the end of a  tube, to directly examine the colon (sigmoidoscopy or colonoscopy), can detect the earliest forms of colorectal cancer. Talk to your health care provider about this at age 63, when routine screening begins. Direct exam of the colon should be repeated every 5-10 years through age 68 years, unless early forms of pre-cancerous polyps or small growths are found.  Osteoporosis is a disease in which the bones lose minerals and strength with aging. This can result in serious bone fractures or breaks. The risk of osteoporosis can be identified using a bone density scan. Women ages 71 years and over and women at risk for fractures or osteoporosis should discuss screening with their health care providers. Ask your health care provider whether you should take a calcium supplement or vitamin D to reduce the rate of osteoporosis.  Menopause can be associated with physical symptoms and  risks. Hormone replacement therapy is available to decrease symptoms and risks. You should talk to your health care provider about whether hormone replacement therapy is right for you.  Use sunscreen. Apply sunscreen liberally and repeatedly throughout the day. You should seek shade when your shadow is shorter than you. Protect yourself by wearing long sleeves, pants, a wide-brimmed hat, and sunglasses year round, whenever you are outdoors.  Once a month, do a whole body skin exam, using a mirror to look at the skin on your back. Tell your health care provider of new moles, moles that have irregular borders, moles that are larger than a pencil eraser, or moles that have changed in shape or color.  Stay current with required vaccines (immunizations).  Influenza vaccine. All adults should be immunized every year.  Tetanus, diphtheria, and acellular pertussis (Td, Tdap) vaccine. Pregnant women should receive 1 dose of Tdap vaccine during each pregnancy. The dose should be obtained regardless of the length of time since the last dose. Immunization is preferred during the 27th-36th week of gestation. An adult who has not previously received Tdap or who does not know her vaccine status should receive 1 dose of Tdap. This initial dose should be followed by tetanus and diphtheria toxoids (Td) booster doses every 10 years. Adults with an unknown or incomplete history of completing a 3-dose immunization series with Td-containing vaccines should begin or complete a primary immunization series including a Tdap dose. Adults should receive a Td booster every 10 years.    Zoster vaccine. One dose is recommended for adults aged 18 years or older unless certain conditions are present.    Pneumococcal 13-valent conjugate (PCV13) vaccine. When indicated, a person who is uncertain of her immunization history and has no record of immunization should receive the PCV13 vaccine. An adult aged 55 years or older who has  certain medical conditions and has not been previously immunized should receive 1 dose of PCV13 vaccine. This PCV13 should be followed with a dose of pneumococcal polysaccharide (PPSV23) vaccine. The PPSV23 vaccine dose should be obtained at least 8 weeks after the dose of PCV13 vaccine. An adult aged 49 years or older who has certain medical conditions and previously received 1 or more doses of PPSV23 vaccine should receive 1 dose of PCV13. The PCV13 vaccine dose should be obtained 1 or more years after the last PPSV23 vaccine dose.    Pneumococcal polysaccharide (PPSV23) vaccine. When PCV13 is also indicated, PCV13 should be obtained first. All adults aged 46 years and older should be immunized. An adult younger than age 81 years who has certain medical conditions should be immunized. Any person  who resides in a nursing home or long-term care facility should be immunized. An adult smoker should be immunized. People with an immunocompromised condition and certain other conditions should receive both PCV13 and PPSV23 vaccines. People with human immunodeficiency virus (HIV) infection should be immunized as soon as possible after diagnosis. Immunization during chemotherapy or radiation therapy should be avoided. Routine use of PPSV23 vaccine is not recommended for American Indians, [place] Natives, or people younger than 65 years unless there are medical conditions that require PPSV23 vaccine. When indicated, people who have unknown immunization and have no record of immunization should receive PPSV23 vaccine. One-time revaccination 5 years after the first dose of PPSV23 is recommended for people aged 19-64 years who have chronic kidney failure, nephrotic syndrome, asplenia, or immunocompromised conditions. People who received 1-2 doses of PPSV23 before age 34 years should receive another dose of PPSV23 vaccine at age 22 years or later if at least 5 years have passed since the previous dose. Doses of PPSV23 are not  needed for people immunized with PPSV23 at or after age 67 years.   Preventive Services / Frequency  Ages 30 years and over  Blood pressure check.  Lipid and cholesterol check.  Lung cancer screening. / Every year if you are aged 25-80 years and have a 30-pack-year history of smoking and currently smoke or have quit within the past 15 years. Yearly screening is stopped once you have quit smoking for at least 15 years or develop a health problem that would prevent you from having lung cancer treatment.  Clinical breast exam.** / Every year after age 27 years.  BRCA-related cancer risk assessment.** / For women who have family members with a BRCA-related cancer (breast, ovarian, tubal, or peritoneal cancers).  Mammogram.** / Every year beginning at age 69 years and continuing for as long as you are in good health. Consult with your health care provider.  Pap test.** / Every 3 years starting at age 39 years through age 29 or 45 years with 3 consecutive normal Pap tests. Testing can be stopped between 65 and 70 years with 3 consecutive normal Pap tests and no abnormal Pap or HPV tests in the past 10 years.  Fecal occult blood test (FOBT) of stool. / Every year beginning at age 32 years and continuing until age 57 years. You may not need to do this test if you get a colonoscopy every 10 years.  Flexible sigmoidoscopy or colonoscopy.** / Every 5 years for a flexible sigmoidoscopy or every 10 years for a colonoscopy beginning at age 63 years and continuing until age 66 years.  Hepatitis C blood test.** / For all people born from [birth year] through 1964 and any individual with known risks for hepatitis C.  Osteoporosis screening.** / A one-time screening for women ages 69 years and over and women at risk for fractures or osteoporosis.  Skin self-exam. / Monthly.  Influenza vaccine. / Every year.  Tetanus, diphtheria, and acellular pertussis (Tdap/Td) vaccine.** / 1 dose of Td every 10  years.  Zoster vaccine.** / 1 dose for adults aged 55 years or older.  Pneumococcal 13-valent conjugate (PCV13) vaccine.** / Consult your health care provider.  Pneumococcal polysaccharide (PPSV23) vaccine.** / 1 dose for all adults aged 68 years and older. Screening for abdominal aortic aneurysm (AAA)  by ultrasound is recommended for people who have history of high blood pressure or who are current or former smokers.

## 2015-04-27 NOTE — Progress Notes (Signed)
Complete Physical  Assessment and Plan: 1. Essential hypertension - continue medications, DASH diet, exercise and monitor at home. Call if greater than 130/80.  - CBC with Differential/Platelet - BASIC METABOLIC PANEL WITH GFR - Hepatic function panel - TSH - Urinalysis, Routine w reflex microscopic (not at [facility]) - Microalbumin / creatinine urine ratio - EKG 12-Lead  2. Prediabetes Discussed general issues about diabetes pathophysiology and management., Educational material distributed., Suggested low cholesterol diet., Encouraged aerobic exercise., Discussed foot care., Reminded to get yearly retinal exam. - Hemoglobin A1c - Insulin, fasting - HM DIABETES FOOT EXAM  3. Mixed hyperlipidemia -continue medications, check lipids, decrease fatty foods, increase activity.  - Lipid panel  4. Vitamin D deficiency - Vit D  25 hydroxy (rtn osteoporosis monitoring)  5. Medication management - Magnesium  6. Mitral valve disorder No symptoms at this time.   7. Diverticulitis of colon Monitor, increase fiber  8. Cervical disc disease Exercises, RICE  9. Uterine leiomyoma, unspecified location S/p hysterectomy  10. Insomnia good sleep hygiene discussed, increase day time activity, continue medication  11. Depression Increase zoloft to 100, suggest counseling, stress management techniques discussed, increase water, good sleep hygiene discussed, increase exercise, and increase veggies.   12. Screening for depression + will increase zoloft to 100mg  daily and follow up 3 months  13. Encounter for general adult medical examination with abnormal findings Get MGM Screening for rabies since close contact with animals.  - CBC with Differential/Platelet - BASIC METABOLIC PANEL WITH GFR - Hepatic function panel - Lipid panel - Hemoglobin A1c - TSH - Insulin, fasting - Magnesium - Vit D  25 hydroxy (rtn osteoporosis monitoring) - Urinalysis, Routine w reflex microscopic (not at  [facility]) - Microalbumin / creatinine urine ratio - EKG 12-Lead - HM DIABETES FOOT EXAM - Dt vaccine greater than 7yo IM - Pneumococcal conjugate vaccine 13-valent IM - Rabies Virus Antibody Titer  14. Need for prophylactic vaccination with combined diphtheria-tetanus-pertussis (DTP) vaccine - Dt vaccine greater than 7yo IM  15. Need for prophylactic vaccination against Streptococcus pneumoniae (pneumococcus) - Pneumococcal conjugate vaccine 13-valent IM  Discussed med's effects and SE's. Screening labs and tests as requested with regular follow-up as recommended. Over 40 minutes of exam, counseling, chart review, and complex, high level critical decision making was performed this visit.   HPI  69 y.o. female  presents for a complete physical.  Her blood pressure has been controlled at home, today their BP is BP: 104/70 mmHg She does workout. She denies chest pain, shortness of breath, dizziness. She will get dizzy occ.  She is on cholesterol medication and denies myalgias. Her cholesterol is at goal. The cholesterol last visit was:   Lab Results  Component Value Date   CHOL 216* 01/21/2015   HDL 89 01/21/2015   LDLCALC 113* 01/21/2015   TRIG 72 01/21/2015   CHOLHDL 2.4 01/21/2015   She has been working on diet and exercise for prediabetes,  and denies paresthesia of the feet, polydipsia, polyuria and visual disturbances. Last A1C in the office was:  Lab Results  Component Value Date   HGBA1C 5.9* 01/21/2015   Patient is on Vitamin D supplement.   Lab Results  Component Value Date   VD25OH 60 01/21/2015     She is on ativan for sleep and zoloft for depression. She has been crying a lot lately, has been on prozac in the past with suicidal ideations. She would like to go up on the zoloft to see if this  helps.  She had EGD in  July with Dr. Deatra Ina, had dilitation of stricture and was put on dexilant, not on prilosec.  She works with [employer] and would like a rabies [employer].     Current Medications:  Current Outpatient Prescriptions on File Prior to Visit  Medication Sig Dispense Refill  . B Complex Vitamins (B COMPLEX PO) Take 1 tablet by mouth daily.     . Cholecalciferol (VITAMIN D3) 5000 UNITS TABS Take 1 tablet by mouth daily.      Marland Kitchen dexlansoprazole (DEXILANT) 60 MG capsule Take 1 capsule (60 mg total) by mouth daily. 30 capsule 3  . LORazepam (ATIVAN) 1 MG tablet Take 1 to 2 tabs at bedtime prn for sleep 60 tablet 5  . MAGNESIUM PO Take 1 tablet by mouth daily.     . metoprolol tartrate (LOPRESSOR) 25 MG tablet TAKE ONE TABLET BY MOUTH TWICE A DAY 60 tablet 5  . omeprazole (PRILOSEC) 40 MG capsule Take 1 capsule (40 mg total) by mouth daily. 45 capsule 11  . pravastatin (PRAVACHOL) 20 MG tablet TAKE 1 TABLET(S) BY MOUTH DAILY 90 tablet 2  . sertraline (ZOLOFT) 50 MG tablet TAKE 1 TABLET (50 MG TOTAL) BY MOUTH DAILY. 30 tablet 11   No current facility-administered medications on file prior to visit.   Health Maintenance:   Immunization History  Administered Date(s) Administered  . Pneumococcal Polysaccharide-23 12/22/2004  . Td 12/16/2003   Last colonoscopy: 2008, normal, due 2018 Flex sig 2011 Dr. Deatra Ina EGD 02/2015 CT AB 11/2014 Last mammogram: 01/29/2013 D[facility] Last pap smear/pelvic exam: 11/2012  DEXA: at OB/GYN- 2010- normal- [facility] Echo 2009 normal EF  Prior vaccinations: TD or Tdap: 2005-DUE Influenza: declines Pneumococcal: 2006 Prevnar 13: declines Shingles/Zostavax: declines  Last Dental Exam: Dr. Yaakov Guthrie, q 4 months Last Eye Exam: Dr. Sabra Heck, Feb 2016 Patient Care Team: Unk Pinto, MD as PCP - General (Internal Medicine) Justice Britain, MD as Consulting Physician (Orthopedic Surgery) Iran Planas, MD as Consulting Physician (Orthopedic Surgery) Thornell Sartorius, MD as Consulting Physician (Otolaryngology) Josue Hector, MD as Consulting Physician (Cardiology) Rana Snare, MD as Consulting Physician (Urology) Anastasio Auerbach, MD as Consulting Physician (Gynecology) Devra Dopp, MD as Referring Physician (Dermatology) Marica Otter, [place] (Optometry) Merrilee Jansky , DDS  Allergies:  Allergies  Allergen Reactions  . Bactrim [Sulfamethoxazole-Trimethoprim] Swelling    Face and eyes swollen and red.  Marland Kitchen Ketek [Telithromycin]    Medical History:  Past Medical History  Diagnosis Date  . Palpitations   . Mitral valve disorders   . Diverticulitis of colon (without mention of hemorrhage)   . Internal hemorrhoids without mention of complication   . Diverticulosis of colon (without mention of hemorrhage)   . Cervical dysplasia 1976  . Elevated cholesterol   . MRSA infection   . Pelvic adhesive disease   . Prediabetes   . Vitamin D deficiency   . GERD (gastroesophageal reflux disease)   . Anemia   . Anxiety   . Arthritis     hands and spine  . Blood transfusion without reported diagnosis   . Heart murmur   . Osteoporosis     spine  . Tachycardia     never had HTN  . Hypertension     pt states she takes med for tachycardia NOT HTN -metoprolol taken for tachycardia   Surgical History:  Past Surgical History  Procedure Laterality Date  . Tympanoplasty    . Rotator cuff repair  LEFT SHOULDER  . Ectopic pregnancy surgery  1971, 1979  . Hemorrhoid surgery    . Oophorectomy    . Gynecologic cryosurgery  1976  . Abdominal hysterectomy  2000    TAH,BSO endometriosis, leiomyomata  . Rotator cuff repair Right 2/15  . Colonoscopy    . Esophagogastroduodenoscopy    . Upper gastrointestinal endoscopy    . Ganglion cyst excision Right     hand    Family History:  Family History  Problem Relation Age of Onset  . Diabetes Mother   . Hypertension Mother   . Cancer Mother     melanoma  . Diabetes Father   . Heart disease Father   . Hyperlipidemia Father   . Hypertension Father   . Cancer Sister     UTERINE  . Colon cancer Maternal Grandmother     dx in her 25's  . Stomach  cancer Maternal Grandmother   . Heart disease Maternal Grandfather   . Heart disease Paternal Grandmother   . Heart disease Paternal Grandfather   . Esophageal cancer Neg Hx   . Rectal cancer Neg Hx    Social History:  Social History  Substance Use Topics  . Smoking status: Former Smoker -- 0.50 packs/day for 30 years    Types: Cigarettes    Quit date: 05/16/2013  . Smokeless tobacco: Never Used  . Alcohol Use: 0.6 oz/week    1 Standard drinks or equivalent per week     Comment: Occasionally few times per month    Review of Systems: Review of Systems  Constitutional: Negative.   HENT: Negative.   Eyes: Negative.   Respiratory: Negative.  Negative for shortness of breath.   Cardiovascular: Negative.  Negative for chest pain and palpitations.  Gastrointestinal: Negative.   Genitourinary: Negative.   Musculoskeletal: Negative.  Negative for falls.  Skin: Negative.   Neurological: Negative.   Psychiatric/Behavioral: Positive for depression. Negative for suicidal ideas, hallucinations, memory loss and substance abuse. The patient is not nervous/anxious and does not have insomnia.     Physical Exam: Estimated body mass index is 23.23 kg/(m^2) as calculated from the following:   Height as of this encounter: 5\' 4"  (1.626 m).   Weight as of this encounter: 135 lb 6.4 oz (61.417 kg). BP 104/70 mmHg  Pulse 68  Temp(Src) 98.4 F (36.9 C)  Resp 16  Ht 5\' 4"  (1.626 m)  Wt 135 lb 6.4 oz (61.417 kg)  BMI 23.23 kg/m2 General Appearance: Well nourished, in no apparent distress.  Eyes: PERRLA, EOMs, conjunctiva no swelling or erythema, normal fundi and vessels.  Sinuses: No Frontal/maxillary tenderness  ENT/Mouth: Ext aud canals clear, normal light reflex with TMs without erythema, bulging. Good dentition. No erythema, swelling, or exudate on post pharynx. Tonsils not swollen or erythematous. Hearing normal.  Neck: Supple, thyroid normal. No bruits  Respiratory: Respiratory effort  normal, BS equal bilaterally without rales, rhonchi, wheezing or stridor.  Cardio: RRR without murmurs, rubs or gallops. Brisk peripheral pulses without edema.  Chest: symmetric, with normal excursions and percussion.  Breasts: defer Abdomen: Soft, nontender, no guarding, rebound, hernias, masses, or organomegaly.  Lymphatics: Non tender without lymphadenopathy.  Genitourinary: defer Musculoskeletal: Full ROM all peripheral extremities,5/5 strength, and normal gait.  Skin: Warm, dry without rashes, lesions, ecchymosis. Neuro: Cranial nerves intact, reflexes equal bilaterally. Normal muscle tone, no cerebellar symptoms. Sensation intact.  Psych: Awake and oriented X 3, normal affect, Insight and Judgment appropriate.   EKG: WNL no ST changes. AORTA  SCAN: defer  Vicie Mutters 10:07 AM [facility]

## 2015-04-28 LAB — URINALYSIS, MICROSCOPIC ONLY
Bacteria, UA: NONE SEEN [HPF]
CASTS: NONE SEEN [LPF]
Crystals: NONE SEEN [HPF]
WBC UA: NONE SEEN WBC/HPF (ref <=5)
Yeast: NONE SEEN [HPF]

## 2015-04-28 LAB — URINALYSIS, ROUTINE W REFLEX MICROSCOPIC
Bilirubin Urine: NEGATIVE
GLUCOSE, UA: NEGATIVE
Ketones, ur: NEGATIVE
Nitrite: NEGATIVE
PROTEIN: NEGATIVE
Specific Gravity, Urine: 1.02 (ref 1.001–1.035)
pH: 5.5 (ref 5.0–8.0)

## 2015-04-28 LAB — HEPATIC FUNCTION PANEL
ALK PHOS: 50 U/L (ref 33–130)
ALT: 10 U/L (ref 6–29)
AST: 14 U/L (ref 10–35)
Albumin: 4.4 g/dL (ref 3.6–5.1)
Bilirubin, Direct: 0.1 mg/dL (ref <=0.2)
Indirect Bilirubin: 0.4 mg/dL (ref 0.2–1.2)
TOTAL PROTEIN: 6.7 g/dL (ref 6.1–8.1)
Total Bilirubin: 0.5 mg/dL (ref 0.2–1.2)

## 2015-04-28 LAB — HEMOGLOBIN A1C
HEMOGLOBIN A1C: 5.8 % — AB (ref <5.7)
MEAN PLASMA GLUCOSE: 120 mg/dL — AB (ref <117)

## 2015-04-28 LAB — BASIC METABOLIC PANEL WITH GFR
BUN: 11 mg/dL (ref 7–25)
CHLORIDE: 104 mmol/L (ref 98–110)
CO2: 27 mmol/L (ref 20–31)
Calcium: 9.2 mg/dL (ref 8.6–10.4)
Creat: 0.73 mg/dL (ref 0.50–0.99)
GFR, EST NON AFRICAN AMERICAN: 85 mL/min (ref >=60)
GFR, Est African American: >89 mL/min (ref >=60)
GLUCOSE: 85 mg/dL (ref 65–99)
POTASSIUM: 4.7 mmol/L (ref 3.5–5.3)
Sodium: 140 mmol/L (ref 135–146)

## 2015-04-28 LAB — MAGNESIUM: Magnesium: 1.9 mg/dL (ref 1.5–2.5)

## 2015-04-28 LAB — VITAMIN D 25 HYDROXY (VIT D DEFICIENCY, FRACTURES): VIT D 25 HYDROXY: 62 ng/mL (ref 30–100)

## 2015-04-28 LAB — LIPID PANEL
CHOL/HDL RATIO: 2.7 ratio (ref <=5.0)
Cholesterol: 235 mg/dL — ABNORMAL HIGH (ref 125–200)
HDL: 86 mg/dL (ref >=46)
LDL Cholesterol: 130 mg/dL — ABNORMAL HIGH (ref <130)
Triglycerides: 96 mg/dL (ref <150)
VLDL: 19 mg/dL (ref <30)

## 2015-04-28 LAB — INSULIN, FASTING: INSULIN FASTING, SERUM: 2 u[IU]/mL (ref 2.0–19.6)

## 2015-04-28 LAB — MICROALBUMIN / CREATININE URINE RATIO
Creatinine, Urine: 208.5 mg/dL
MICROALB/CREAT RATIO: 3.4 mg/g (ref 0.0–30.0)
Microalb, Ur: 0.7 mg/dL (ref <2.0)

## 2015-04-28 LAB — TSH: TSH: 2.008 u[IU]/mL (ref 0.350–4.500)

## 2015-05-10 ENCOUNTER — Encounter: Payer: Self-pay | Admitting: Physician Assistant

## 2015-05-12 ENCOUNTER — Ambulatory Visit (INDEPENDENT_AMBULATORY_CARE_PROVIDER_SITE_OTHER): Payer: PPO | Admitting: Physician Assistant

## 2015-05-12 ENCOUNTER — Encounter: Payer: Self-pay | Admitting: Physician Assistant

## 2015-05-12 ENCOUNTER — Other Ambulatory Visit (INDEPENDENT_AMBULATORY_CARE_PROVIDER_SITE_OTHER): Payer: PPO

## 2015-05-12 VITALS — BP 100/62 | HR 76 | Ht 64.0 in | Wt 133.2 lb

## 2015-05-12 DIAGNOSIS — R197 Diarrhea, unspecified: Secondary | ICD-10-CM

## 2015-05-12 LAB — CBC WITH DIFFERENTIAL/PLATELET
BASOS PCT: 0.3 % (ref 0.0–3.0)
Basophils Absolute: 0 10*3/uL (ref 0.0–0.1)
Eosinophils Absolute: 0.1 10*3/uL (ref 0.0–0.7)
Eosinophils Relative: 1.4 % (ref 0.0–5.0)
HEMATOCRIT: 34.1 % — AB (ref 36.0–46.0)
Hemoglobin: 11.5 g/dL — ABNORMAL LOW (ref 12.0–15.0)
LYMPHS PCT: 32.3 % (ref 12.0–46.0)
Lymphs Abs: 1.8 10*3/uL (ref 0.7–4.0)
MCHC: 33.7 g/dL (ref 30.0–36.0)
MCV: 92.4 fl (ref 78.0–100.0)
MONOS PCT: 7.4 % (ref 3.0–12.0)
Monocytes Absolute: 0.4 10*3/uL (ref 0.1–1.0)
NEUTROS ABS: 3.4 10*3/uL (ref 1.4–7.7)
Neutrophils Relative %: 58.6 % (ref 43.0–77.0)
PLATELETS: 265 10*3/uL (ref 150.0–400.0)
RBC: 3.69 Mil/uL — ABNORMAL LOW (ref 3.87–5.11)
RDW: 13.9 % (ref 11.5–15.5)
WBC: 5.7 10*3/uL (ref 4.0–10.5)

## 2015-05-12 LAB — BASIC METABOLIC PANEL
BUN: 14 mg/dL (ref 6–23)
CHLORIDE: 108 meq/L (ref 96–112)
CO2: 25 mEq/L (ref 19–32)
CREATININE: 0.84 mg/dL (ref 0.40–1.20)
Calcium: 9.3 mg/dL (ref 8.4–10.5)
GFR: 71.7 mL/min (ref >60.00)
Glucose, Bld: 84 mg/dL (ref 70–99)
Potassium: 3.9 mEq/L (ref 3.5–5.1)
Sodium: 139 mEq/L (ref 135–145)

## 2015-05-12 MED ORDER — SACCHAROMYCES BOULARDII 250 MG PO CAPS: ORAL_CAPSULE | ORAL | Status: DC

## 2015-05-12 NOTE — Patient Instructions (Addendum)
Please go to the basement level to have your labs drawn and stool studies.  You have been given a separate informational sheet regarding your tobacco use, the importance of quitting and local resources to help you quit. We sent a prescription to [facility] for [employer] probiotic. You can use Imodium as needed. Push fluids.

## 2015-05-12 NOTE — Progress Notes (Signed)
Patient ID: [name] [name], female   DOB: [birth year], 69 y.o.   MRN: [id number]   Subjective:    Patient ID: [name] [name], female    DOB: 1945/09/11, 69 y.o.   MRN: [id number]  HPI [name] [name] is a pleasant 69 year old white female known to Dr. Deatra Ina. She was last in the office in June 2016 at that time with complaints of dysphagia and has since undergone EGD with finding of a distal esophageal stricture. This was dilated and she was started on Dexilant 60 mg by mouth daily. Last colonoscopy was done in 2008 showing diffuse diverticulosis and internal hemorrhoids. She comes in today with new complaints of acute onset of diarrhea approximate 10 days ago. This has been persistent. She hasn't had no associated abdominal pain or cramping no fever or chills sweats nausea or vomiting. She does complain of fatigue and says she generally just doesn't feel very well. Her appetite has been fair. She's having at least 10 liquid bowel movements per day and getting up a couple of times each night with diarrhea. She did have one episode of incontinence. She says the stool is very mucoid and malodorous and nonbloody. She's not had any known infectious exposures, no recent antibiotics, no new medications other than Dexilantwhich was started in July, and no travel.  Review of Systems Pertinent positive and negative review of systems were noted in the above HPI section.  All other review of systems was otherwise negative.  Outpatient Encounter Prescriptions as of 05/12/2015  Medication Sig  . B Complex Vitamins (B COMPLEX PO) Take 1 tablet by mouth daily.   . Cholecalciferol (VITAMIN D3) 5000 UNITS TABS Take 1 tablet by mouth daily.    Marland Kitchen dexlansoprazole (DEXILANT) 60 MG capsule Take 1 capsule (60 mg total) by mouth daily.  Marland Kitchen LORazepam (ATIVAN) 1 MG tablet Take 1 to 2 tabs at bedtime prn for sleep  . MAGNESIUM PO Take 1 tablet by mouth daily.   . metoprolol tartrate (LOPRESSOR) 25 MG tablet TAKE ONE TABLET  BY MOUTH TWICE A DAY  . pravastatin (PRAVACHOL) 20 MG tablet TAKE 1 TABLET(S) BY MOUTH DAILY  . sertraline (ZOLOFT) 100 MG tablet Take 1 tablet (100 mg total) by mouth daily.  Marland Kitchen saccharomyces boulardii (FLORASTOR) 250 MG capsule Take 1 tab twice daily x 14 days.  . [DISCONTINUED] omeprazole (PRILOSEC) 40 MG capsule Take 1 capsule (40 mg total) by mouth daily.   No facility-administered encounter medications on file as of 05/12/2015.   Allergies  Allergen Reactions  . Bactrim [Sulfamethoxazole-Trimethoprim] Swelling    Face and eyes swollen and red.  Marland Kitchen Ketek [Telithromycin]    Patient Active Problem List   Diagnosis Date Noted  . Encounter for Medicare annual wellness exam 04/27/2015  . Depression 11/27/2014  . Insomnia 11/27/2014  . Cervical disc disease 10/10/2014  . Medication management 10/02/2014  . Mixed hyperlipidemia 07/01/2014  . Hypertension   . Prediabetes   . Vitamin D deficiency   . Fibroid   . Mitral valve disorder 06/16/2009  . Diverticulitis of colon 05/06/2008   Social History   Social History  . Marital Status: Married    Spouse Name: N/A  . Number of Children: 0  . Years of Education: N/A   Occupational History  . retired    Social History Main Topics  . Smoking status: Light Tobacco Smoker -- 0.50 packs/day for 30 years    Types: Cigarettes    Last Attempt to Quit: 05/16/2013  . Smokeless tobacco:  Never Used     Comment: form given 05/12/15  . Alcohol Use: 0.6 oz/week    1 Standard drinks or equivalent per week     Comment: Occasionally few times per month  . Drug Use: No  . Sexual Activity: Yes    Birth Control/ Protection: Surgical   Other Topics Concern  . Not on file   Social History Narrative   She lives with husband.  They do not have children.   She previously worked at [employer].   Highest level of education:  High school     Ms. Vita's family history includes Cirrhosis in her father; Colon cancer in her maternal  grandmother; Colon polyps in her sister; Diabetes in her father and mother; Heart disease in her father, maternal grandfather, paternal grandfather, and paternal grandmother; Hyperlipidemia in her father; Hypertension in her father and mother; Lymphoma in her maternal grandmother; Melanoma in her mother; Stomach cancer in her maternal grandmother; Uterine cancer in her sister. There is no history of Esophageal cancer, Rectal cancer, Kidney disease, or Pancreatic cancer.      Objective:    Filed Vitals:   05/12/15 1442  BP: 100/62  Pulse: 76    Physical Exam well-developed older white female in no acute distress, pleasant blood pressure 100/62 pulse 75 height 5 foot 4 weight 133. HEENT; nontraumatic normocephalic EOMI PERRLA sclera anicteric, Supple ;no JVD, Cardiovascular ;regular rate and rhythm with S1-S2 no murmur or gallop, Pulmonary clear bilaterally, Abdomen ;soft nontender nondistended bowel sounds are active is no palpable mass or hepatosplenomegaly, Rectal ;exam not done, Extremities ;no clubbing cyanosis or edema skin warm and dry, Neuropsych; mood and affect appropriate       Assessment & Plan:   #1 69 yo female with 10 day hx of profuse diarrhea, and fatigue. Very likely  infectious, r/o CDiff. #2 [name] [name]  With hx of esophageal stricture- stable #3 diverticulosis  Plan; Will check CBC,BMET, stool for cdiff and GI path panel  Push fluid, bland diet' Imodium prn Start Florastor one po BID x 2-3 weeks ,we'll await stool studies prior to deciding on any antibiotic therapy. Patient aware to call if any worsening of symptoms in the interim    Alfredia Ferguson PA-C 05/12/2015   Cc: Unk Pinto, MD

## 2015-05-13 ENCOUNTER — Other Ambulatory Visit: Payer: PPO

## 2015-05-13 ENCOUNTER — Other Ambulatory Visit: Payer: Self-pay | Admitting: Internal Medicine

## 2015-05-13 ENCOUNTER — Other Ambulatory Visit: Payer: Self-pay | Admitting: *Deleted

## 2015-05-13 DIAGNOSIS — R197 Diarrhea, unspecified: Secondary | ICD-10-CM

## 2015-05-13 MED ORDER — PRAVASTATIN SODIUM 20 MG PO TABS: ORAL_TABLET | ORAL | Status: DC

## 2015-05-14 ENCOUNTER — Other Ambulatory Visit: Payer: Self-pay

## 2015-05-14 ENCOUNTER — Telehealth: Payer: Self-pay | Admitting: Physician Assistant

## 2015-05-14 LAB — GASTROINTESTINAL PATHOGEN PANEL PCR
C. DIFFICILE TOX A/B, PCR: NEGATIVE
Campylobacter, PCR: NEGATIVE
Cryptosporidium, PCR: NEGATIVE
E COLI (ETEC) LT/ST, PCR: NEGATIVE
E COLI (STEC) STX1/STX2, PCR: NEGATIVE
E COLI 0157, PCR: NEGATIVE
Giardia lamblia, PCR: NEGATIVE
Norovirus, PCR: NEGATIVE
Rotavirus A, PCR: NEGATIVE
SALMONELLA, PCR: NEGATIVE
Shigella, PCR: NEGATIVE

## 2015-05-14 LAB — CLOSTRIDIUM DIFFICILE BY PCR: Toxigenic C. Difficile by PCR: NOT DETECTED

## 2015-05-14 MED ORDER — SACCHAROMYCES BOULARDII 250 MG PO CAPS: ORAL_CAPSULE | ORAL | Status: DC

## 2015-05-14 NOTE — Telephone Encounter (Signed)
Spoke with the patient. The GI pathogen panel is pending as confirmed by [employer]. She continues to have multiple liquid bowel movements. She is eating a bland diet and does not take Imodium unless she is going out of the home. Encouraged to drink fluids with calories (not high sugar) like Gatorade or broth. Florastor sent to the pharmacy as a prescription. Advised her insurance may not cover the costs.

## 2015-05-15 ENCOUNTER — Other Ambulatory Visit: Payer: Self-pay

## 2015-05-15 MED ORDER — CIPROFLOXACIN HCL 500 MG PO TABS: 500.0000 mg | ORAL_TABLET | Freq: Two times a day (BID) | ORAL | Status: DC

## 2015-05-15 NOTE — Telephone Encounter (Signed)
Please see the office note from 05/12/15. The stool studies are all negative. Her diarrhea continues. Please advise of treatment plan.

## 2015-05-15 NOTE — Telephone Encounter (Signed)
Let's empirically begin Cipro 500 mg twice a day.  Asked her to call back on Monday

## 2015-05-15 NOTE — Telephone Encounter (Signed)
Patient instructed.

## 2015-05-18 NOTE — Progress Notes (Signed)
Reviewed and agree with management. Robert D. Kaplan, M.D., FACG  

## 2015-05-18 NOTE — Telephone Encounter (Signed)
I have left message for the patient to call back if she is not improving

## 2015-05-19 NOTE — Telephone Encounter (Signed)
Patient states she is having fewer diarrhea bowel movements. Still has a little discomfort in her left side that hurts more if she pushes on it. She still has some stool urgency after eating.She will let us know if she does not continue to improve.

## 2015-05-20 ENCOUNTER — Encounter: Payer: Self-pay | Admitting: Internal Medicine

## 2015-05-22 LAB — RABIES VIRUS ANTIBODY TITER

## 2015-05-25 ENCOUNTER — Telehealth: Payer: Self-pay | Admitting: Physician Assistant

## 2015-05-26 MED ORDER — METRONIDAZOLE 250 MG PO TABS: 250.0000 mg | ORAL_TABLET | Freq: Four times a day (QID) | ORAL | Status: DC

## 2015-05-26 MED ORDER — PANTOPRAZOLE SODIUM 40 MG PO TBEC: 40.0000 mg | DELAYED_RELEASE_TABLET | Freq: Every day | ORAL | Status: DC

## 2015-05-26 NOTE — Telephone Encounter (Signed)
Per 05/14/15 telephone note Dr. Deatra Ina prescribed Cipro 500mg  BID #20. Cipro is on her med list not flagyl. Do you want her to now have the Flagyl?

## 2015-05-26 NOTE — Telephone Encounter (Signed)
I ordered Flagyl for her 250 mg 4 sx daily x 14 days - I did not order Cipro- can you find out from her pharmacy what she actually  [place]..  It was ordered a couple weeks ago.Marland KitchenMarland KitchenIf She has not had a course of flagyl please call that in ... Also would ask her to Stop Dexilant just in case contributing to diarrhea- can substitute Protonix 40 mg po qam  X one month

## 2015-05-26 NOTE — Telephone Encounter (Signed)
Spoke with pt and she is aware. Scripts sent to pharmacy. 

## 2015-05-26 NOTE — Telephone Encounter (Signed)
Pt states she has been having diarrhea for 25 days. States she has not had a normal BM since 05/01/15. Reports she is having around 7 diarrhea stools per day. Pt finished cipro that she was placed on yesterday morning. Please advise.

## 2015-05-26 NOTE — Telephone Encounter (Signed)
Yes...lets give her Flagyl 250 mg 4 x daily x 14 days , continue florastor BID x 3 more weeks

## 2015-06-18 ENCOUNTER — Other Ambulatory Visit: Payer: Self-pay | Admitting: *Deleted

## 2015-06-18 MED ORDER — LORAZEPAM 1 MG PO TABS: ORAL_TABLET | ORAL | Status: DC

## 2015-07-14 ENCOUNTER — Telehealth: Payer: Self-pay | Admitting: Internal Medicine

## 2015-07-14 ENCOUNTER — Other Ambulatory Visit: Payer: Self-pay

## 2015-07-14 DIAGNOSIS — E2839 Other primary ovarian failure: Secondary | ICD-10-CM

## 2015-07-14 NOTE — Telephone Encounter (Signed)
patient requests DEXA order  Thanks Katrina

## 2015-07-15 NOTE — Addendum Note (Signed)
Addended by: Vicie Mutters R on: 07/15/2015 08:33 AM   Modules accepted: Orders

## 2015-07-19 ENCOUNTER — Other Ambulatory Visit: Payer: Self-pay | Admitting: Cardiovascular Disease

## 2015-07-29 ENCOUNTER — Other Ambulatory Visit: Payer: Self-pay

## 2015-07-29 DIAGNOSIS — Z1231 Encounter for screening mammogram for malignant neoplasm of breast: Secondary | ICD-10-CM

## 2015-08-11 ENCOUNTER — Ambulatory Visit (INDEPENDENT_AMBULATORY_CARE_PROVIDER_SITE_OTHER): Payer: PPO | Admitting: Internal Medicine

## 2015-08-11 ENCOUNTER — Encounter: Payer: Self-pay | Admitting: Internal Medicine

## 2015-08-11 VITALS — BP 112/72 | HR 60 | Temp 97.0°F | Resp 16 | Ht 64.0 in | Wt 133.4 lb

## 2015-08-11 DIAGNOSIS — Z79899 Other long term (current) drug therapy: Secondary | ICD-10-CM | POA: Diagnosis not present

## 2015-08-11 DIAGNOSIS — I10 Essential (primary) hypertension: Secondary | ICD-10-CM

## 2015-08-11 DIAGNOSIS — R7989 Other specified abnormal findings of blood chemistry: Secondary | ICD-10-CM | POA: Diagnosis not present

## 2015-08-11 DIAGNOSIS — R7303 Prediabetes: Secondary | ICD-10-CM

## 2015-08-11 DIAGNOSIS — R945 Abnormal results of liver function studies: Secondary | ICD-10-CM

## 2015-08-11 DIAGNOSIS — E559 Vitamin D deficiency, unspecified: Secondary | ICD-10-CM | POA: Diagnosis not present

## 2015-08-11 DIAGNOSIS — E782 Mixed hyperlipidemia: Secondary | ICD-10-CM

## 2015-08-11 LAB — HEPATIC FUNCTION PANEL
ALBUMIN: 4.1 g/dL (ref 3.6–5.1)
ALT: 10 U/L (ref 6–29)
AST: 14 U/L (ref 10–35)
Alkaline Phosphatase: 51 U/L (ref 33–130)
BILIRUBIN DIRECT: 0.1 mg/dL (ref <=0.2)
Indirect Bilirubin: 0.4 mg/dL (ref 0.2–1.2)
Total Bilirubin: 0.5 mg/dL (ref 0.2–1.2)
Total Protein: 6.4 g/dL (ref 6.1–8.1)

## 2015-08-11 LAB — CBC WITH DIFFERENTIAL/PLATELET
BASOS PCT: 0 % (ref 0–1)
Basophils Absolute: 0 10*3/uL (ref 0.0–0.1)
EOS ABS: 0.1 10*3/uL (ref 0.0–0.7)
Eosinophils Relative: 2 % (ref 0–5)
HCT: 35.1 % — ABNORMAL LOW (ref 36.0–46.0)
HEMOGLOBIN: 11.7 g/dL — AB (ref 12.0–15.0)
LYMPHS ABS: 2.2 10*3/uL (ref 0.7–4.0)
Lymphocytes Relative: 33 % (ref 12–46)
MCH: 30.2 pg (ref 26.0–34.0)
MCHC: 33.3 g/dL (ref 30.0–36.0)
MCV: 90.7 fL (ref 78.0–100.0)
MONO ABS: 0.4 10*3/uL (ref 0.1–1.0)
MONOS PCT: 6 % (ref 3–12)
MPV: 8.8 fL (ref 8.6–12.4)
Neutro Abs: 3.9 10*3/uL (ref 1.7–7.7)
Neutrophils Relative %: 59 % (ref 43–77)
PLATELETS: 256 10*3/uL (ref 150–400)
RBC: 3.87 MIL/uL (ref 3.87–5.11)
RDW: 14.6 % (ref 11.5–15.5)
WBC: 6.6 10*3/uL (ref 4.0–10.5)

## 2015-08-11 LAB — LIPID PANEL
CHOLESTEROL: 231 mg/dL — AB (ref 125–200)
HDL: 100 mg/dL (ref >=46)
LDL CALC: 116 mg/dL (ref <130)
TRIGLYCERIDES: 73 mg/dL (ref <150)
Total CHOL/HDL Ratio: 2.3 Ratio (ref <=5.0)
VLDL: 15 mg/dL (ref <30)

## 2015-08-11 LAB — TSH: TSH: 1.948 u[IU]/mL (ref 0.350–4.500)

## 2015-08-11 LAB — BASIC METABOLIC PANEL WITH GFR
BUN: 16 mg/dL (ref 7–25)
CALCIUM: 9.2 mg/dL (ref 8.6–10.4)
CHLORIDE: 102 mmol/L (ref 98–110)
CO2: 25 mmol/L (ref 20–31)
CREATININE: 0.79 mg/dL (ref 0.50–0.99)
GFR, Est African American: 89 mL/min (ref >=60)
GFR, Est Non African American: 77 mL/min (ref >=60)
Glucose, Bld: 75 mg/dL (ref 65–99)
Potassium: 4.3 mmol/L (ref 3.5–5.3)
SODIUM: 138 mmol/L (ref 135–146)

## 2015-08-11 LAB — MAGNESIUM: MAGNESIUM: 1.8 mg/dL (ref 1.5–2.5)

## 2015-08-11 NOTE — Progress Notes (Signed)
Patient ID: [name] [name], female   DOB: November 09, 1945, 69 y.o.   MRN: [id number]   This very nice 69 y.o. MWF presents for  follow up with Hypertension, Hyperlipidemia, Pre-Diabetes and Vitamin D Deficiency.    Patient is treated for HTN & palpitations & BP has been controlled at home. Today's BP: 112/72 mmHg. Patient has had no complaints of any cardiac type chest pain, palpitations, dyspnea/orthopnea/PND, dizziness, claudication, or dependent edema.   Hyperlipidemia is not controlled with diet & meds. Patient denies myalgias or other med SE's. Last Lipids were not at goal with Cholesterol 235*; HDL 86; LDL 130*; Triglycerides 96 on 04/27/2015.   Also, the patient has history of PreDiabetes and has had no symptoms of reactive hypoglycemia, diabetic polys, paresthesias or visual blurring.  Last A1c was  5.8% on 04/27/2015.   Further, the patient also has history of Vitamin D Deficiency and supplements vitamin D without any suspected side-effects. Last vitamin D was 62 on 04/27/2015.  Medication Sig  . B Complex Vitamins  Take 1 tablet by mouth daily.   Marland Kitchen VITAMIN D 5000 UNITS  Take 1 tablet by mouth daily.    Marland Kitchen LORazepam  1 MG tab Take 1 to 2 tabs at bedtime prn for sleep  . MAGNESIUM  Take 1 tablet by mouth daily.   . metoprolol tartrate  25 MG tab TAKE ONE TABLET BY MOUTH TWICE A DAY  . sertraline  100 MG tab Take 1 tablet (100 mg total) by mouth daily.  . pravastatin  20 MG tab TAKE 1 TABLET(S) BY MOUTH DAILY  . saccharomyces boulardii (FLORASTOR) 250 mg Take 1 tab twice daily x 14 days.   Allergies  Allergen Reactions  . Bactrim [Sulfamethoxazole-Trimethoprim] Swelling    Face and eyes swollen and red.  Marland Kitchen Ketek [Telithromycin]    PMHx:   Past Medical History  Diagnosis Date  . Palpitations   . Mitral valve disorders   . Diverticulitis of colon (without mention of hemorrhage)   . Internal hemorrhoids without mention of complication   . Diverticulosis of colon (without mention of  hemorrhage)   . Cervical dysplasia 1976  . Elevated cholesterol   . MRSA infection   . Pelvic adhesive disease   . Prediabetes   . Vitamin D deficiency   . GERD (gastroesophageal reflux disease)   . Anemia   . Anxiety   . Arthritis     hands and spine  . Blood transfusion without reported diagnosis   . Heart murmur   . Osteoporosis     spine  . Tachycardia     never had HTN  . Hypertension     pt states she takes med for tachycardia NOT HTN -metoprolol taken for tachycardia   Immunization History  Administered Date(s) Administered  . DT 04/27/2015  . Pneumococcal Conjugate-13 04/27/2015  . Pneumococcal Polysaccharide-23 12/22/2004  . Td 12/16/2003   Past Surgical History  Procedure Laterality Date  . Tympanoplasty Right   . Rotator cuff repair Left   . Ectopic pregnancy surgery  1971, 1979  . Hemorrhoid surgery    . Oophorectomy    . Gynecologic cryosurgery  1976  . Abdominal hysterectomy  2000    TAH,BSO endometriosis, leiomyomata  . Rotator cuff repair Right 2/15  . Colonoscopy    . Esophagogastroduodenoscopy    . Upper gastrointestinal endoscopy    . Ganglion cyst excision Right     hand   . Finger surgery     FHx:  Reviewed / unchanged  SHx:    Reviewed / unchanged  Systems Review:  Constitutional: Denies fever, chills, wt changes, headaches, insomnia, fatigue, night sweats, change in appetite. Eyes: Denies redness, blurred vision, diplopia, discharge, itchy, watery eyes.  ENT: Denies discharge, congestion, post nasal drip, epistaxis, sore throat, earache, hearing loss, dental pain, tinnitus, vertigo, sinus pain, snoring.  CV: Denies chest pain, palpitations, irregular heartbeat, syncope, dyspnea, diaphoresis, orthopnea, PND, claudication or edema. Respiratory: denies cough, dyspnea, DOE, pleurisy, hoarseness, laryngitis, wheezing.  Gastrointestinal: Denies dysphagia, odynophagia, heartburn, reflux, water brash, abdominal pain or cramps, nausea, vomiting,  bloating, diarrhea, constipation, hematemesis, melena, hematochezia  or hemorrhoids. Genitourinary: Denies dysuria, frequency, urgency, nocturia, hesitancy, discharge, hematuria or flank pain. Musculoskeletal: Denies arthralgias, myalgias, stiffness, jt. swelling, pain, limping or strain/sprain.  Skin: Denies pruritus, rash, hives, warts, acne, eczema or change in skin lesion(s). Neuro: No weakness, tremor, incoordination, spasms, paresthesia or pain. Psychiatric: Denies confusion, memory loss or sensory loss. Endo: Denies change in weight, skin or hair change.  Heme/Lymph: No excessive bleeding, bruising or enlarged lymph nodes.  Physical Exam  BP 112/72 mmHg  Pulse 60  Temp(Src) 97 F (36.1 C)  Resp 16  Ht 5\' 4"  (1.626 m)  Wt 133 lb 6.4 oz (60.51 kg)  BMI 22.89 kg/m2  Appears well nourished and in no distress. Eyes: PERRLA, EOMs, conjunctiva no swelling or erythema. Sinuses: No frontal/maxillary tenderness ENT/Mouth: EAC's clear, TM's nl w/o erythema, bulging. Nares clear w/o erythema, swelling, exudates. Oropharynx clear without erythema or exudates. Oral hygiene is good. Tongue normal, non obstructing. Hearing intact.  Neck: Supple. Thyroid nl. Car 2+/2+ without bruits, nodes or JVD. Chest: Respirations nl with BS clear & equal w/o rales, rhonchi, wheezing or stridor.  Cor: Heart sounds normal w/ regular rate and rhythm without sig. murmurs, gallops, clicks, or rubs. Peripheral pulses normal and equal  without edema.  Abdomen: Soft & bowel sounds normal. Non-tender w/o guarding, rebound, hernias, masses, or organomegaly.  Lymphatics: Unremarkable.  Musculoskeletal: Full ROM all peripheral extremities, joint stability, 5/5 strength, and normal gait.  Skin: Warm, dry without exposed rashes, lesions or ecchymosis apparent.  Neuro: Cranial nerves intact, reflexes equal bilaterally. Sensory-motor testing grossly intact. Tendon reflexes grossly intact.  Pysch: Alert & oriented x 3.   Insight and judgement nl & appropriate. No ideations.  Assessment and Plan:  1. Essential hypertension  - TSH  2. Mixed hyperlipidemia  - Lipid panel - TSH  3. Prediabetes  - Hemoglobin A1c - Insulin, random  4. Vitamin D deficiency  - VITAMIN D 25 Hydroxy   5. Abnormal LFTs  - Hepatitis A antibody, total - Hepatitis B core antibody, total - Hepatitis B e antibody - Hepatitis B surface antibody - Hepatitis C antibody  6. Medication management   - CBC with Differential/Platelet - BASIC METABOLIC PANEL WITH GFR - Hepatic function panel - Magnesium   Recommended regular exercise, BP monitoring, weight control, and discussed med and SE's. Recommended labs to assess and monitor clinical status. Further disposition pending results of labs. Over 30 minutes of exam, counseling, chart review was performed

## 2015-08-11 NOTE — Patient Instructions (Signed)

## 2015-08-12 ENCOUNTER — Other Ambulatory Visit: Payer: Self-pay | Admitting: Internal Medicine

## 2015-08-12 DIAGNOSIS — E782 Mixed hyperlipidemia: Secondary | ICD-10-CM

## 2015-08-12 LAB — HEPATITIS A ANTIBODY, TOTAL: HEP A TOTAL AB: NONREACTIVE

## 2015-08-12 LAB — HEPATITIS B SURFACE ANTIBODY,QUALITATIVE: Hep B S Ab: NEGATIVE

## 2015-08-12 LAB — INSULIN, RANDOM: INSULIN: 1.5 u[IU]/mL — AB (ref 2.0–19.6)

## 2015-08-12 LAB — HEMOGLOBIN A1C
Hgb A1c MFr Bld: 5.9 % — ABNORMAL HIGH (ref <5.7)
MEAN PLASMA GLUCOSE: 123 mg/dL — AB (ref <117)

## 2015-08-12 LAB — HEPATITIS C ANTIBODY: HCV Ab: NEGATIVE

## 2015-08-12 LAB — HEPATITIS B CORE ANTIBODY, TOTAL: HEP B C TOTAL AB: NONREACTIVE

## 2015-08-12 LAB — VITAMIN D 25 HYDROXY (VIT D DEFICIENCY, FRACTURES): VIT D 25 HYDROXY: 66 ng/mL (ref 30–100)

## 2015-08-12 MED ORDER — ATORVASTATIN CALCIUM 80 MG PO TABS: ORAL_TABLET | ORAL | Status: DC

## 2015-08-13 LAB — HEPATITIS B E ANTIBODY: Hepatitis Be Antibody: NONREACTIVE

## 2015-09-03 ENCOUNTER — Ambulatory Visit
Admission: RE | Admit: 2015-09-03 | Discharge: 2015-09-03 | Disposition: A | Discharge status: Home / Self Care | Payer: PPO | Source: Ambulatory Visit

## 2015-09-03 ENCOUNTER — Ambulatory Visit
Admission: RE | Admit: 2015-09-03 | Discharge: 2015-09-03 | Disposition: A | Discharge status: Home / Self Care | Payer: PPO | Source: Ambulatory Visit | Attending: Physician Assistant | Admitting: Physician Assistant

## 2015-09-03 DIAGNOSIS — M8588 Other specified disorders of bone density and structure, other site: Secondary | ICD-10-CM | POA: Diagnosis not present

## 2015-09-03 DIAGNOSIS — Z1231 Encounter for screening mammogram for malignant neoplasm of breast: Secondary | ICD-10-CM | POA: Diagnosis not present

## 2015-09-03 DIAGNOSIS — E2839 Other primary ovarian failure: Secondary | ICD-10-CM

## 2015-10-06 DIAGNOSIS — D3132 Benign neoplasm of left choroid: Secondary | ICD-10-CM | POA: Diagnosis not present

## 2015-10-06 DIAGNOSIS — H25813 Combined forms of age-related cataract, bilateral: Secondary | ICD-10-CM | POA: Diagnosis not present

## 2015-10-06 DIAGNOSIS — H4321 Crystalline deposits in vitreous body, right eye: Secondary | ICD-10-CM | POA: Diagnosis not present

## 2015-10-06 DIAGNOSIS — H52221 Regular astigmatism, right eye: Secondary | ICD-10-CM | POA: Diagnosis not present

## 2015-10-06 DIAGNOSIS — H5203 Hypermetropia, bilateral: Secondary | ICD-10-CM | POA: Diagnosis not present

## 2015-11-09 ENCOUNTER — Other Ambulatory Visit: Payer: Self-pay | Admitting: Physician Assistant

## 2015-11-11 ENCOUNTER — Ambulatory Visit (INDEPENDENT_AMBULATORY_CARE_PROVIDER_SITE_OTHER): Payer: PPO | Admitting: Physician Assistant

## 2015-11-11 ENCOUNTER — Other Ambulatory Visit: Payer: Self-pay

## 2015-11-11 ENCOUNTER — Encounter: Payer: Self-pay | Admitting: Physician Assistant

## 2015-11-11 VITALS — BP 106/60 | HR 62 | Temp 97.7°F | Resp 14 | Ht 64.0 in | Wt 135.2 lb

## 2015-11-11 DIAGNOSIS — R6889 Other general symptoms and signs: Secondary | ICD-10-CM | POA: Diagnosis not present

## 2015-11-11 DIAGNOSIS — E782 Mixed hyperlipidemia: Secondary | ICD-10-CM | POA: Diagnosis not present

## 2015-11-11 DIAGNOSIS — M5090 Cervical disc disorder, unspecified, unspecified cervical region: Secondary | ICD-10-CM

## 2015-11-11 DIAGNOSIS — R7303 Prediabetes: Secondary | ICD-10-CM | POA: Diagnosis not present

## 2015-11-11 DIAGNOSIS — I059 Rheumatic mitral valve disease, unspecified: Secondary | ICD-10-CM | POA: Diagnosis not present

## 2015-11-11 DIAGNOSIS — G4700 Insomnia, unspecified: Secondary | ICD-10-CM

## 2015-11-11 DIAGNOSIS — F329 Major depressive disorder, single episode, unspecified: Secondary | ICD-10-CM

## 2015-11-11 DIAGNOSIS — I10 Essential (primary) hypertension: Secondary | ICD-10-CM | POA: Diagnosis not present

## 2015-11-11 DIAGNOSIS — Z79899 Other long term (current) drug therapy: Secondary | ICD-10-CM

## 2015-11-11 DIAGNOSIS — F32A Depression, unspecified: Secondary | ICD-10-CM

## 2015-11-11 DIAGNOSIS — D259 Leiomyoma of uterus, unspecified: Secondary | ICD-10-CM | POA: Diagnosis not present

## 2015-11-11 DIAGNOSIS — Z0001 Encounter for general adult medical examination with abnormal findings: Secondary | ICD-10-CM

## 2015-11-11 DIAGNOSIS — E559 Vitamin D deficiency, unspecified: Secondary | ICD-10-CM

## 2015-11-11 DIAGNOSIS — Z0000 Encounter for general adult medical examination without abnormal findings: Secondary | ICD-10-CM

## 2015-11-11 DIAGNOSIS — K5732 Diverticulitis of large intestine without perforation or abscess without bleeding: Secondary | ICD-10-CM | POA: Diagnosis not present

## 2015-11-11 DIAGNOSIS — R7309 Other abnormal glucose: Secondary | ICD-10-CM | POA: Diagnosis not present

## 2015-11-11 LAB — HEPATIC FUNCTION PANEL
ALT: 10 U/L (ref 6–29)
AST: 13 U/L (ref 10–35)
Albumin: 4.3 g/dL (ref 3.6–5.1)
Alkaline Phosphatase: 60 U/L (ref 33–130)
BILIRUBIN DIRECT: 0.1 mg/dL (ref <=0.2)
Indirect Bilirubin: 0.4 mg/dL (ref 0.2–1.2)
Total Bilirubin: 0.5 mg/dL (ref 0.2–1.2)
Total Protein: 6.5 g/dL (ref 6.1–8.1)

## 2015-11-11 LAB — LIPID PANEL
CHOLESTEROL: 207 mg/dL — AB (ref 125–200)
HDL: 103 mg/dL (ref >=46)
LDL Cholesterol: 83 mg/dL (ref <130)
TRIGLYCERIDES: 105 mg/dL (ref <150)
Total CHOL/HDL Ratio: 2 Ratio (ref <=5.0)
VLDL: 21 mg/dL (ref <30)

## 2015-11-11 LAB — CBC WITH DIFFERENTIAL/PLATELET
Basophils Absolute: 0 10*3/uL (ref 0.0–0.1)
Basophils Relative: 0 % (ref 0–1)
Eosinophils Absolute: 0.2 10*3/uL (ref 0.0–0.7)
Eosinophils Relative: 2 % (ref 0–5)
HEMATOCRIT: 35.7 % — AB (ref 36.0–46.0)
HEMOGLOBIN: 12 g/dL (ref 12.0–15.0)
LYMPHS ABS: 2.6 10*3/uL (ref 0.7–4.0)
LYMPHS PCT: 33 % (ref 12–46)
MCH: 30.5 pg (ref 26.0–34.0)
MCHC: 33.6 g/dL (ref 30.0–36.0)
MCV: 90.8 fL (ref 78.0–100.0)
MONO ABS: 0.4 10*3/uL (ref 0.1–1.0)
MPV: 9 fL (ref 8.6–12.4)
Monocytes Relative: 5 % (ref 3–12)
NEUTROS ABS: 4.7 10*3/uL (ref 1.7–7.7)
Neutrophils Relative %: 60 % (ref 43–77)
Platelets: 249 10*3/uL (ref 150–400)
RBC: 3.93 MIL/uL (ref 3.87–5.11)
RDW: 14.6 % (ref 11.5–15.5)
WBC: 7.8 10*3/uL (ref 4.0–10.5)

## 2015-11-11 LAB — BASIC METABOLIC PANEL WITH GFR
BUN: 12 mg/dL (ref 7–25)
CO2: 27 mmol/L (ref 20–31)
Calcium: 9.6 mg/dL (ref 8.6–10.4)
Chloride: 103 mmol/L (ref 98–110)
Creat: 0.79 mg/dL (ref 0.50–0.99)
GFR, EST NON AFRICAN AMERICAN: 77 mL/min (ref >=60)
GFR, Est African American: 89 mL/min (ref >=60)
GLUCOSE: 83 mg/dL (ref 65–99)
POTASSIUM: 4.4 mmol/L (ref 3.5–5.3)
Sodium: 139 mmol/L (ref 135–146)

## 2015-11-11 LAB — MAGNESIUM: MAGNESIUM: 1.7 mg/dL (ref 1.5–2.5)

## 2015-11-11 NOTE — Addendum Note (Signed)
Addended by: Vicie Mutters R on: 11/11/2015 03:06 PM   Modules accepted: Orders

## 2015-11-11 NOTE — Patient Instructions (Signed)
Do neosporin and wet warm compresses on your nostril, can do saline spray as well, if not better or gets worse call the office and I will send in an antibiotic for you.   Preventive Care for Adults A healthy lifestyle and preventive care can promote health and wellness. Preventive health guidelines for women include the following key practices.  A routine yearly physical is a good way to check with your health care provider about your health and preventive screening. It is a chance to share any concerns and updates on your health and to receive a thorough exam.  Visit your dentist for a routine exam and preventive care every 6 months. Brush your teeth twice a day and floss once a day. Good oral hygiene prevents tooth decay and gum disease.  The frequency of eye exams is based on your age, health, family medical history, use of contact lenses, and other factors. Follow your health care provider's recommendations for frequency of eye exams.  Eat a healthy diet. Foods like vegetables, fruits, whole grains, low-fat dairy products, and lean protein foods contain the nutrients you need without too many calories. Decrease your intake of foods high in solid fats, added sugars, and salt. Eat the right amount of calories for you.Get information about a proper diet from your health care provider, if necessary.  Regular physical exercise is one of the most important things you can do for your health. Most adults should get at least 150 minutes of moderate-intensity exercise (any activity that increases your heart rate and causes you to sweat) each week. In addition, most adults need muscle-strengthening exercises on 2 or more days a week.  Maintain a healthy weight. The body mass index (BMI) is a screening tool to identify possible weight problems. It provides an estimate of body fat based on height and weight. Your health care provider can find your BMI and can help you achieve or maintain a healthy weight.For  adults 20 years and older:  A BMI below 18.5 is considered underweight.  A BMI of 18.5 to 24.9 is normal.  A BMI of 25 to 29.9 is considered overweight.  A BMI of 30 and above is considered obese.  Maintain normal blood lipids and cholesterol levels by exercising and minimizing your intake of saturated fat. Eat a balanced diet with plenty of fruit and vegetables. If your lipid or cholesterol levels are high, you are over 50, or you are at high risk for heart disease, you may need your cholesterol levels checked more frequently.Ongoing high lipid and cholesterol levels should be treated with medicines if diet and exercise are not working.  If you smoke, find out from your health care provider how to quit. If you do not use tobacco, do not start.  Lung cancer screening is recommended for adults aged 47-80 years who are at high risk for developing lung cancer because of a history of smoking. A yearly low-dose CT scan of the lungs is recommended for people who have at least a 30-pack-year history of smoking and are a current smoker or have quit within the past 15 years. A pack year of smoking is smoking an average of 1 pack of cigarettes a day for 1 year (for example: 1 pack a day for 30 years or 2 packs a day for 15 years). Yearly screening should continue until the smoker has stopped smoking for at least 15 years. Yearly screening should be stopped for people who develop a health problem that would prevent them  from having lung cancer treatment.  Avoid use of street drugs. Do not share needles with anyone. Ask for help if you need support or instructions about stopping the use of drugs.  High blood pressure causes heart disease and increases the risk of stroke.  Ongoing high blood pressure should be treated with medicines if weight loss and exercise do not work.  If you are 27-43 years old, ask your health care provider if you should take aspirin to prevent strokes.  Diabetes screening involves  taking a blood sample to check your fasting blood sugar level. This should be done once every 3 years, after age 53, if you are within normal weight and without risk factors for diabetes. Testing should be considered at a younger age or be carried out more frequently if you are overweight and have at least 1 risk factor for diabetes.  Breast cancer screening is essential preventive care for women. You should practice "breast self-awareness." This means understanding the normal appearance and feel of your breasts and may include breast self-examination. Any changes detected, no matter how small, should be reported to a health care provider. Women in their 87s and 30s should have a clinical breast exam (CBE) by a health care provider as part of a regular health exam every 1 to 3 years. After age 40, women should have a CBE every year. Starting at age 42, women should consider having a mammogram (breast X-ray test) every year. Women who have a family history of breast cancer should talk to their health care provider about genetic screening. Women at a high risk of breast cancer should talk to their health care providers about having an MRI and a mammogram every year.  Breast cancer gene (BRCA)-related cancer risk assessment is recommended for women who have family members with BRCA-related cancers. BRCA-related cancers include breast, ovarian, tubal, and peritoneal cancers. Having family members with these cancers may be associated with an increased risk for harmful changes (mutations) in the breast cancer genes BRCA1 and BRCA2. Results of the assessment will determine the need for genetic counseling and BRCA1 and BRCA2 testing.  Routine pelvic exams to screen for cancer are no longer recommended for nonpregnant women who are considered low risk for cancer of the pelvic organs (ovaries, uterus, and vagina) and who do not have symptoms. Ask your health care provider if a screening pelvic exam is right for  you.  If you have had past treatment for cervical cancer or a condition that could lead to cancer, you need Pap tests and screening for cancer for at least 20 years after your treatment. If Pap tests have been discontinued, your risk factors (such as having a new sexual partner) need to be reassessed to determine if screening should be resumed. Some women have medical problems that increase the chance of getting cervical cancer. In these cases, your health care provider may recommend more frequent screening and Pap tests.    Colorectal cancer can be detected and often prevented. Most routine colorectal cancer screening begins at the age of 38 years and continues through age 47 years. However, your health care provider may recommend screening at an earlier age if you have risk factors for colon cancer. On a yearly basis, your health care provider may provide home test kits to check for hidden blood in the stool. Use of a small camera at the end of a tube, to directly examine the colon (sigmoidoscopy or colonoscopy), can detect the earliest forms of colorectal cancer. Talk  to your health care provider about this at age 2, when routine screening begins. Direct exam of the colon should be repeated every 5-10 years through age 47 years, unless early forms of pre-cancerous polyps or small growths are found.  Osteoporosis is a disease in which the bones lose minerals and strength with aging. This can result in serious bone fractures or breaks. The risk of osteoporosis can be identified using a bone density scan. Women ages 53 years and over and women at risk for fractures or osteoporosis should discuss screening with their health care providers. Ask your health care provider whether you should take a calcium supplement or vitamin D to reduce the rate of osteoporosis.  Menopause can be associated with physical symptoms and risks. Hormone replacement therapy is available to decrease symptoms and risks. You should  talk to your health care provider about whether hormone replacement therapy is right for you.  Use sunscreen. Apply sunscreen liberally and repeatedly throughout the day. You should seek shade when your shadow is shorter than you. Protect yourself by wearing long sleeves, pants, a wide-brimmed hat, and sunglasses year round, whenever you are outdoors.  Once a month, do a whole body skin exam, using a mirror to look at the skin on your back. Tell your health care provider of new moles, moles that have irregular borders, moles that are larger than a pencil eraser, or moles that have changed in shape or color.  Stay current with required vaccines (immunizations).  Influenza vaccine. All adults should be immunized every year.  Tetanus, diphtheria, and acellular pertussis (Td, Tdap) vaccine. Pregnant women should receive 1 dose of Tdap vaccine during each pregnancy. The dose should be obtained regardless of the length of time since the last dose. Immunization is preferred during the 27th-36th week of gestation. An adult who has not previously received Tdap or who does not know her vaccine status should receive 1 dose of Tdap. This initial dose should be followed by tetanus and diphtheria toxoids (Td) booster doses every 10 years. Adults with an unknown or incomplete history of completing a 3-dose immunization series with Td-containing vaccines should begin or complete a primary immunization series including a Tdap dose. Adults should receive a Td booster every 10 years.    Zoster vaccine. One dose is recommended for adults aged 8 years or older unless certain conditions are present.    Pneumococcal 13-valent conjugate (PCV13) vaccine. When indicated, a person who is uncertain of her immunization history and has no record of immunization should receive the PCV13 vaccine. An adult aged 47 years or older who has certain medical conditions and has not been previously immunized should receive 1 dose of  PCV13 vaccine. This PCV13 should be followed with a dose of pneumococcal polysaccharide (PPSV23) vaccine. The PPSV23 vaccine dose should be obtained at least 8 weeks after the dose of PCV13 vaccine. An adult aged 37 years or older who has certain medical conditions and previously received 1 or more doses of PPSV23 vaccine should receive 1 dose of PCV13. The PCV13 vaccine dose should be obtained 1 or more years after the last PPSV23 vaccine dose.    Pneumococcal polysaccharide (PPSV23) vaccine. When PCV13 is also indicated, PCV13 should be obtained first. All adults aged 16 years and older should be immunized. An adult younger than age 1 years who has certain medical conditions should be immunized. Any person who resides in a nursing home or long-term care facility should be immunized. An adult smoker should be  immunized. People with an immunocompromised condition and certain other conditions should receive both PCV13 and PPSV23 vaccines. People with human immunodeficiency virus (HIV) infection should be immunized as soon as possible after diagnosis. Immunization during chemotherapy or radiation therapy should be avoided. Routine use of PPSV23 vaccine is not recommended for American Indians, [place] Natives, or people younger than 65 years unless there are medical conditions that require PPSV23 vaccine. When indicated, people who have unknown immunization and have no record of immunization should receive PPSV23 vaccine. One-time revaccination 5 years after the first dose of PPSV23 is recommended for people aged 19-64 years who have chronic kidney failure, nephrotic syndrome, asplenia, or immunocompromised conditions. People who received 1-2 doses of PPSV23 before age 55 years should receive another dose of PPSV23 vaccine at age 26 years or later if at least 5 years have passed since the previous dose. Doses of PPSV23 are not needed for people immunized with PPSV23 at or after age 19 years.   Preventive  Services / Frequency  Ages 75 years and over  Blood pressure check.  Lipid and cholesterol check.  Lung cancer screening. / Every year if you are aged 56-80 years and have a 30-pack-year history of smoking and currently smoke or have quit within the past 15 years. Yearly screening is stopped once you have quit smoking for at least 15 years or develop a health problem that would prevent you from having lung cancer treatment.  Clinical breast exam.** / Every year after age 61 years.  BRCA-related cancer risk assessment.** / For women who have family members with a BRCA-related cancer (breast, ovarian, tubal, or peritoneal cancers).  Mammogram.** / Every year beginning at age 4 years and continuing for as long as you are in good health. Consult with your health care provider.  Pap test.** / Every 3 years starting at age 67 years through age 57 or 45 years with 3 consecutive normal Pap tests. Testing can be stopped between 65 and 70 years with 3 consecutive normal Pap tests and no abnormal Pap or HPV tests in the past 10 years.  Fecal occult blood test (FOBT) of stool. / Every year beginning at age 56 years and continuing until age 88 years. You may not need to do this test if you get a colonoscopy every 10 years.  Flexible sigmoidoscopy or colonoscopy.** / Every 5 years for a flexible sigmoidoscopy or every 10 years for a colonoscopy beginning at age 35 years and continuing until age 62 years.  Hepatitis C blood test.** / For all people born from [birth year] through 1964 and any individual with known risks for hepatitis C.  Osteoporosis screening.** / A one-time screening for women ages 52 years and over and women at risk for fractures or osteoporosis.  Skin self-exam. / Monthly.  Influenza vaccine. / Every year.  Tetanus, diphtheria, and acellular pertussis (Tdap/Td) vaccine.** / 1 dose of Td every 10 years.  Zoster vaccine.** / 1 dose for adults aged 89 years or older.  Pneumococcal  13-valent conjugate (PCV13) vaccine.** / Consult your health care provider.  Pneumococcal polysaccharide (PPSV23) vaccine.** / 1 dose for all adults aged 57 years and older. Screening for abdominal aortic aneurysm (AAA)  by ultrasound is recommended for people who have history of high blood pressure or who are current or former smokers.

## 2015-11-11 NOTE — Progress Notes (Signed)
Medicare Wellness Visit and 3 month OV Assessment:   1. Essential hypertension - continue medications, DASH diet, exercise and monitor at home. Call if greater than 130/80.  - CBC with Differential/Platelet - BASIC METABOLIC PANEL WITH GFR - Hepatic function panel  2. Prediabetes Discussed general issues about diabetes pathophysiology and management., Educational material distributed., Suggested low cholesterol diet., Encouraged aerobic exercise., Discussed foot care., Reminded to get yearly retinal exam.  3. Mixed hyperlipidemia -continue medications, check lipids, decrease fatty foods, increase activity.   4. Vitamin D deficiency Continue supplement  5. Uterine leiomyoma, unspecified location S/p hysterectomy  6. Cervical disc disease RICE, NSAIDS, exercises given, if not better get xray and PT referral or ortho referral.   7. Diverticulitis of colon No AB pain/rebound tenderness, may need to follow up GI.   8. Mitral valve disorder No SOB, CP, edema, monitor  9. Medication management  10. Estrogen deficiency Dexa in 5 years  83. Depression  stress management techniques discussed, increase water, good sleep hygiene discussed, increase exercise, and increase veggies.   12. Insomnia Insomnia- good sleep hygiene discussed, increase day time activity,continue ativan  Future Appointments Date Time Provider [place]  11/24/2015 4:00 PM Josue Hector, MD [facility]  02/16/2016 2:30 PM Starlyn Skeans, PA-C [facility] None  05/25/2016 2:00 PM Vicie Mutters, PA-C [facility] None    Plan:   During the course of the visit the patient was educated and counseled about appropriate screening and preventive services including:    Influenza vaccine  Td vaccine  Screening electrocardiogram  Screening mammography  Bone densitometry screening  Colorectal cancer screening  Diabetes screening  Glaucoma screening  Nutrition counseling    Conditions/risks identified: BMI: Discussed weight loss, diet, and increase physical activity.  Increase physical activity: AHA recommends 150 minutes of physical activity a week.  Medications reviewed DEXA- ordered Diabetes is at goal, ACE/ARB therapy: No, Reason not on Ace Inhibitor/ARB therapy:  No DM anymore Urinary Incontinence is not an issue: discussed non pharmacology and pharmacology options.  Fall risk: low- discussed PT, home fall assessment, medications.   Subjective:   [name] [name] is a 70 y.o. female who presents for Medicare Annual Wellness Visit and 3 month follow up for HTN, chol, preDM, and depression.  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  She denies chest pain, shortness of breath, dizziness.  She is on cholesterol medication and denies myalgias. Her cholesterol is at goal. The cholesterol last visit was:   Lab Results  Component Value Date   CHOL 231* 08/11/2015   HDL 100 08/11/2015   LDLCALC 116 08/11/2015   TRIG 73 08/11/2015   CHOLHDL 2.3 08/11/2015   She has been working on diet and exercise for prediabetes, and denies paresthesia of the feet, polydipsia, polyuria and visual disturbances. Last A1C in the office was:  Lab Results  Component Value Date   HGBA1C 5.9* 08/11/2015  She is on Ativan for sleep and zoloft for depression.  Patient is on Vitamin D supplement. Lab Results  Component Value Date   VD25OH 66 08/11/2015   Has been following with Dr. Deatra Ina and Edward Jolly PA-C for diarrhea/AB pain which has resolved.   She states she has had a right burning growth/nodule x Friday, no sinus issues, no fever/chills.   Medication Review  Current Outpatient Prescriptions on File Prior to Visit  Medication Sig Dispense Refill  . atorvastatin (LIPITOR) 80 MG tablet  Take 1/2 to 1 tablet daily or as directed for Cholesterol 30 tablet 5  . B Complex Vitamins (B COMPLEX PO) Take 1 tablet by mouth daily.     . Cholecalciferol (VITAMIN D3) 5000 UNITS TABS Take 1 tablet by mouth daily.      Marland Kitchen LORazepam (ATIVAN) 1 MG tablet Take 1 to 2 tabs at bedtime prn for sleep 60 tablet 5  . Magnesium 250 MG TABS Take by mouth.    . metoprolol tartrate (LOPRESSOR) 25 MG tablet TAKE ONE TABLET BY MOUTH TWICE A DAY 60 tablet 2  . sertraline (ZOLOFT) 100 MG tablet TAKE 1 TABLET (100 MG TOTAL) BY MOUTH DAILY. 90 tablet 1   No current facility-administered medications on file prior to visit.    Current Problems (verified) Patient Active Problem List   Diagnosis Date Noted  . Encounter for Medicare annual wellness exam 04/27/2015  . Depression, controlled 11/27/2014  . Insomnia 11/27/2014  . Cervical disc disease 10/10/2014  . Medication management 10/02/2014  . Mixed hyperlipidemia 07/01/2014  . Hypertension   . Prediabetes   . Vitamin D deficiency   . Fibroid   . Mitral valve disorder 06/16/2009  . Diverticulitis of colon 05/06/2008    Screening Tests Health Maintenance  Topic Date Due  . ZOSTAVAX  07/20/2007  . PAP SMEAR  12/05/2013  . INFLUENZA VACCINE  03/30/2015  . PNA vac Low Risk Adult (2 of 2 - PPSV23) 04/26/2016  . COLONOSCOPY  10/10/2016  . MAMMOGRAM  09/02/2017  . TETANUS/TDAP  04/26/2025  . DEXA SCAN  Completed  . Hepatitis C Screening  Completed     Immunization History  Administered Date(s) Administered  . DT 04/27/2015  . Pneumococcal Conjugate-13 04/27/2015  . Pneumococcal Polysaccharide-23 12/22/2004  . Td 12/16/2003   Preventative care: Last colonoscopy: 2008, normal, due 2018 EGD 02/2015 Flex sig 2011 Dr. Deatra Ina Last mammogram: 08/2015, CAT B Last pap smear/pelvic exam: 11/2012 , 1 abnormal in her 20's DEXA: at OB/GYN-08/2015 Echo 2009 normal EF Ct AB 11/2014  Prior vaccinations: TD or Tdap: 2016 Influenza:  declines Pneumococcal: 2006 Prevnar 13: 2016 Shingles/Zostavax: declines  Names of Other Physician/Practitioners you currently use: 1. [facility]- here for primary care 2. Dr. Sabra Heck, eye doctor, last visit Jan 2017 3. Dr. Yaakov Guthrie, dentist, visit q 4 months Patient Care Team: Unk Pinto, MD as PCP - General (Internal Medicine) Justice Britain, MD as Consulting Physician (Orthopedic Surgery) Iran Planas, MD as Consulting Physician (Orthopedic Surgery) Thornell Sartorius, MD as Consulting Physician (Otolaryngology) Barbaraann Cao, OD as Referring Physician (Optometry) Josue Hector, MD as Consulting Physician (Cardiology) Rana Snare, MD as Consulting Physician (Urology) Anastasio Auerbach, MD as Consulting Physician (Gynecology) Devra Dopp, MD as Referring Physician (Dermatology)  Allergies Allergies  Allergen Reactions  . Bactrim [Sulfamethoxazole-Trimethoprim] Swelling    Face and eyes swollen and red.  Marland Kitchen Ketek [Telithromycin]    Surgical history Past Surgical History  Procedure Laterality Date  . Tympanoplasty Right   . Rotator cuff repair Left   . Ectopic pregnancy surgery  1971, 1979  . Hemorrhoid surgery    . Oophorectomy    . Gynecologic cryosurgery  1976  . Abdominal hysterectomy  2000    TAH,BSO endometriosis, leiomyomata  . Rotator cuff repair Right 2/15  . Colonoscopy    . Esophagogastroduodenoscopy    .  Upper gastrointestinal endoscopy    . Ganglion cyst excision Right     hand   . Finger surgery     Family history Family History  Problem Relation Age of Onset  . Diabetes Mother   . Hypertension Mother   . Melanoma Mother   . Diabetes Father   . Heart disease Father   . Hyperlipidemia Father   . Hypertension Father   . Uterine cancer Sister     UTERINE  . Colon cancer Maternal Grandmother     dx in her 2's ?mets from lymphoma  . Stomach cancer Maternal Grandmother   . Heart disease  Maternal Grandfather   . Heart disease Paternal Grandmother   . Heart disease Paternal Grandfather   . Esophageal cancer Neg Hx   . Rectal cancer Neg Hx   . Lymphoma Maternal Grandmother   . Colon polyps Sister   . Cirrhosis Father     alcoholic  . Kidney disease Neg Hx   . Pancreatic cancer Neg Hx    Risk Factors: Osteoporosis: postmenopausal estrogen deficiency, dietary calcium and/or vitamin D deficiency and amenorrhea History of fracture in the past year: no  Tobacco Social History  Substance Use Topics  . Smoking status: Light Tobacco Smoker -- 0.50 packs/day for 30 years    Types: Cigarettes    Last Attempt to Quit: 05/16/2013  . Smokeless tobacco: Never Used     Comment: form given 05/12/15  . Alcohol Use: 0.6 oz/week    1 Standard drinks or equivalent per week     Comment: Occasionally few times per month   MEDICARE WELLNESS OBJECTIVES: Tobacco use: She does not smoke.  Patient is a former smoker. If yes, counseling given Alcohol Current alcohol use: social drinker Diet: in general, a "healthy" diet   Physical activity: Current Exercise Habits: Home exercise routine, Type of exercise: walking, Time (Minutes): 20, Frequency (Times/Week): 3, Weekly Exercise (Minutes/Week): 60, Intensity: Mild Cardiac risk factors: Cardiac Risk Factors include: advanced age (>48men, >73 women);dyslipidemia;hypertension;sedentary lifestyle;family history of premature cardiovascular disease Depression/mood screen:   Depression screen [facility] 2/9 11/11/2015  Decreased Interest 0  Down, Depressed, Hopeless 0  PHQ - 2 Score 0  Altered sleeping -  Tired, decreased energy -  Change in appetite -  Feeling bad or failure about yourself  -  Trouble concentrating -  Moving slowly or fidgety/restless -  Suicidal thoughts -  PHQ-9 Score -  Difficult doing work/chores -    ADLs:  In your present state of health, do you have any difficulty performing the following activities: 11/11/2015  Hearing?  N  Vision? N  Difficulty concentrating or making decisions? Y  Walking or climbing stairs? N  Dressing or bathing? N  Doing errands, shopping? N  Preparing Food and eating ? N  Using the Toilet? N  In the past six months, have you accidently leaked urine? N  Do you have problems with loss of bowel control? N  Managing your Medications? N  Managing your Finances? N  Housekeeping or managing your Housekeeping? N    Cognitive Testing  Alert? Yes  Normal Appearance?Yes  Oriented to person? Yes  Place? Yes   Time? Yes  Recall of three objects?  Yes  Can perform simple calculations? Yes  Displays appropriate judgment?Yes  Can read the correct time from a watch face?Yes  EOL planning: Does patient have an advance directive?: Yes Type of Advance Directive: [place] will Does patient want to make changes  to advanced directive?: No - Patient declined Copy of advanced directive(s) in chart?: No - copy requested   Objective:   Blood pressure 106/60, pulse 62, temperature 97.7 F (36.5 C), temperature source Temporal, resp. rate 14, height 5\' 4"  (1.626 m), weight 135 lb 3.2 oz (61.326 kg), SpO2 98 %. Body mass index is 23.2 kg/(m^2).  General appearance: alert, no distress, WD/WN,  female Cognitive Testing  Alert? Yes  Normal Appearance?Yes  Oriented to person? Yes  Place? Yes   Time? Yes  Recall of three objects?  No 3/3  Can perform simple calculations? Yes  Displays appropriate judgment?Yes  Can read the correct time from a watch face?Yes  HEENT: normocephalic, sclerae anicteric, TMs pearly, nares patent, no discharge or erythema, pharynx normal, right inner nostril at septum with small tender nodule, non fluctuant  Oral cavity: MMM, no lesions Neck: supple, no lymphadenopathy, no thyromegaly, no masses Heart: RRR, bradycardia, slightly holosystolic murmur, normal S1, S2 Lungs: CTA bilaterally, no wheezes, rhonchi, or rales Abdomen: +bs, soft, non  tender, no hepatomegaly, no splenomegaly Musculoskeletal: nontender, no swelling, no obvious deformity Extremities: no edema, no cyanosis, no clubbing Pulses: 2+ symmetric, upper and lower extremities, normal cap refill Neurological: alert, oriented x 3, CN2-12 intact, strength normal upper extremities and lower extremities, sensation normal throughout, DTRs 2+ throughout, no cerebellar signs, gait normal Psychiatric: normal affect, behavior normal, pleasant  Breast: defer [id number] Rectal: defer  Medicare Attestation I have personally reviewed: The patient's medical and social history Their use of alcohol, tobacco or illicit drugs Their current medications and supplements The patient's functional ability including ADLs,fall risks, home safety risks, cognitive, and hearing and visual impairment Diet and physical activities Evidence for depression or mood disorders  The patient's weight, height, BMI, and visual acuity have been recorded in the chart.  I have made referrals, counseling, and provided education to the patient based on review of the above and I have provided the patient with a written personalized care plan for preventive services.     Vicie Mutters, PA-C   11/11/2015

## 2015-11-12 LAB — TSH: TSH: 1.19 mIU/L

## 2015-11-12 LAB — HEMOGLOBIN A1C
Hgb A1c MFr Bld: 5.7 % — ABNORMAL HIGH (ref <5.7)
Mean Plasma Glucose: 117 mg/dL — ABNORMAL HIGH (ref <117)

## 2015-11-12 LAB — VITAMIN D 25 HYDROXY (VIT D DEFICIENCY, FRACTURES): VIT D 25 HYDROXY: 57 ng/mL (ref 30–100)

## 2015-11-21 NOTE — Progress Notes (Signed)
Patient ID: [name] [name], female   DOB: February 09, 1946, 70 y.o.   MRN: [id number]   Parminder is seen today for F/U of palpitatiions, PAC's and  Previous  smoking She denies SSCP, dyspnea, palpitations or syncope. She has no known structural heart disease. She has stopped smoking since 2014  I congratulated her on this. She used Chantix CXR last year ok . No new issues seeing Dr Ernestine Conrad now   Still not smoking since 4/14 but now admits to smoking when she has wine   Had right shoulder surgery for torn tendon  Seeing Dr Onnie Graham  Injured right wrist with neuropathy during PT in March Now complaining of bilateral and weakness Has seen neuro and diagnosed with bilateral CTS and has left mulilevel cervical foraminal narrowing  ? Epidural planned with Dr Nelva Bush     Lab Results  Component Value Date   CHOL 207* 11/11/2015   HDL 103 11/11/2015   LDLCALC 83 11/11/2015   TRIG 105 11/11/2015   CHOLHDL 2.0 11/11/2015   Lab Results  Component Value Date   ALT 10 11/11/2015   AST 13 11/11/2015   ALKPHOS 60 11/11/2015   BILITOT 0.5 11/11/2015    ROS: Denies fever, malais, weight loss, blurry vision, decreased visual acuity, cough, sputum, SOB, hemoptysis, pleuritic pain, palpitaitons, heartburn, abdominal pain, melena, lower extremity edema, claudication, or rash.  All other systems reviewed and negative  General: Affect appropriate Healthy:  appears stated age 79: normal Neck supple with no adenopathy JVP normal no bruits no thyromegaly Lungs clear with no wheezing and good diaphragmatic motion Heart:  S1/S2 no murmur, no rub, gallop or click PMI normal Abdomen: benighn, BS positve, no tenderness, no AAA no bruit.  No HSM or HJR Distal pulses intact with no bruits No edema Neuro non-focal Skin warm and dry Weakness with poor hand grip bilaterally and ? Thenar atrophy   Current Outpatient Prescriptions  Medication Sig Dispense Refill  . atorvastatin (LIPITOR) 80 MG tablet Take 1/2  to 1 tablet daily or as directed for Cholesterol 30 tablet 5  . B Complex Vitamins (B COMPLEX PO) Take 1 tablet by mouth daily.     . Cholecalciferol (VITAMIN D3) 5000 UNITS TABS Take 1 tablet by mouth daily.      Marland Kitchen LORazepam (ATIVAN) 1 MG tablet Take 1 to 2 tabs at bedtime prn for sleep 60 tablet 5  . Magnesium 250 MG TABS Take by mouth.    . metoprolol tartrate (LOPRESSOR) 25 MG tablet TAKE ONE TABLET BY MOUTH TWICE A DAY 60 tablet 2  . omeprazole (PRILOSEC) 40 MG capsule     . sertraline (ZOLOFT) 100 MG tablet TAKE 1 TABLET (100 MG TOTAL) BY MOUTH DAILY. 90 tablet 1   No current facility-administered medications for this visit.    Allergies  Bactrim and Ketek  Electrocardiogram:  SR rate 55 normal  02/18/14  Assessment and Plan Palpitations/PAC;s: benign continue beta blocker Elevated Lipids: takes 40 mg lipitor 3x/week Cholesterol is at goal.  Continue current dose of statin and diet Rx.  No myalgias or side effects.  F/U  LFT's in 6 months. Lab Results  Component Value Date   LDLCALC 83 11/11/2015            Cervical Spine: f/u neuro stable GERD: related to smoking and wine continue prilosec Depression: improved on Zoloft Smoking:  Still has occasional cigarettes when she drinks wine  f/u CXR   Jenkins Rouge

## 2015-11-24 ENCOUNTER — Ambulatory Visit (INDEPENDENT_AMBULATORY_CARE_PROVIDER_SITE_OTHER)
Admission: RE | Admit: 2015-11-24 | Discharge: 2015-11-24 | Disposition: A | Discharge status: Home / Self Care | Payer: PPO | Source: Ambulatory Visit | Attending: Cardiovascular Disease | Admitting: Cardiovascular Disease

## 2015-11-24 ENCOUNTER — Encounter: Payer: Self-pay | Admitting: Cardiovascular Disease

## 2015-11-24 ENCOUNTER — Ambulatory Visit (INDEPENDENT_AMBULATORY_CARE_PROVIDER_SITE_OTHER): Payer: PPO | Admitting: Cardiovascular Disease

## 2015-11-24 VITALS — BP 90/50 | HR 66 | Ht 64.0 in | Wt 134.0 lb

## 2015-11-24 DIAGNOSIS — I10 Essential (primary) hypertension: Secondary | ICD-10-CM

## 2015-11-24 DIAGNOSIS — Z720 Tobacco use: Secondary | ICD-10-CM

## 2015-11-24 DIAGNOSIS — F17200 Nicotine dependence, unspecified, uncomplicated: Secondary | ICD-10-CM

## 2015-11-24 DIAGNOSIS — Z0000 Encounter for general adult medical examination without abnormal findings: Secondary | ICD-10-CM | POA: Diagnosis not present

## 2015-11-24 NOTE — Patient Instructions (Signed)
Medication Instructions:  Your physician recommends that you continue on your current medications as directed. Please refer to the Current Medication list given to you today.  Labwork: NONE  Testing/Procedures: A chest x-ray takes a picture of the organs and structures inside the chest, including the heart, lungs, and blood vessels. This test can show several things, including, whether the heart is enlarges; whether fluid is building up in the lungs; and whether pacemaker / defibrillator leads are still in place.   Follow-Up: Your physician wants you to follow-up in: 12 months with Dr. Nishan. You will receive a reminder letter in the mail two months in advance. If you don't receive a letter, please call our office to schedule the follow-up appointment.   If you need a refill on your cardiac medications before your next appointment, please call your pharmacy.    

## 2015-11-25 ENCOUNTER — Telehealth: Payer: Self-pay | Admitting: Cardiovascular Disease

## 2015-11-25 NOTE — Telephone Encounter (Signed)
F/u  Pt returning Rn phone call. Please call back and discuss.   

## 2015-11-25 NOTE — Telephone Encounter (Signed)
Called patient back with her chest xray results. Patient is aware of these results.

## 2015-12-16 ENCOUNTER — Other Ambulatory Visit: Payer: Self-pay | Admitting: Internal Medicine

## 2015-12-16 DIAGNOSIS — G4700 Insomnia, unspecified: Secondary | ICD-10-CM

## 2016-01-07 ENCOUNTER — Encounter: Payer: Self-pay | Admitting: Gastroenterology

## 2016-01-30 ENCOUNTER — Emergency Department (HOSPITAL_COMMUNITY)
Admission: EM | Admit: 2016-01-30 | Discharge: 2016-01-30 | Disposition: A | Discharge status: Home / Self Care | Payer: PPO | Attending: Emergency Medicine | Admitting: Emergency Medicine

## 2016-01-30 ENCOUNTER — Encounter (HOSPITAL_COMMUNITY): Payer: Self-pay | Admitting: Emergency Medicine

## 2016-01-30 ENCOUNTER — Emergency Department (HOSPITAL_COMMUNITY): Payer: PPO

## 2016-01-30 DIAGNOSIS — R109 Unspecified abdominal pain: Secondary | ICD-10-CM | POA: Diagnosis not present

## 2016-01-30 DIAGNOSIS — R51 Headache: Secondary | ICD-10-CM | POA: Insufficient documentation

## 2016-01-30 DIAGNOSIS — R519 Headache, unspecified: Secondary | ICD-10-CM

## 2016-01-30 DIAGNOSIS — Z87891 Personal history of nicotine dependence: Secondary | ICD-10-CM | POA: Diagnosis not present

## 2016-01-30 DIAGNOSIS — N390 Urinary tract infection, site not specified: Secondary | ICD-10-CM | POA: Diagnosis not present

## 2016-01-30 DIAGNOSIS — I10 Essential (primary) hypertension: Secondary | ICD-10-CM | POA: Insufficient documentation

## 2016-01-30 DIAGNOSIS — Z79899 Other long term (current) drug therapy: Secondary | ICD-10-CM | POA: Diagnosis not present

## 2016-01-30 LAB — COMPREHENSIVE METABOLIC PANEL
ALBUMIN: 4.4 g/dL (ref 3.5–5.0)
ALK PHOS: 56 U/L (ref 38–126)
ALT: 17 U/L (ref 14–54)
ANION GAP: 8 (ref 5–15)
AST: 17 U/L (ref 15–41)
BILIRUBIN TOTAL: 0.9 mg/dL (ref 0.3–1.2)
BUN: 14 mg/dL (ref 6–20)
CALCIUM: 9.4 mg/dL (ref 8.9–10.3)
CO2: 24 mmol/L (ref 22–32)
Chloride: 104 mmol/L (ref 101–111)
Creatinine, Ser: 0.93 mg/dL (ref 0.44–1.00)
GFR calc Af Amer: >60 mL/min (ref >60)
GFR calc non Af Amer: >60 mL/min (ref >60)
GLUCOSE: 101 mg/dL — AB (ref 65–99)
POTASSIUM: 3.8 mmol/L (ref 3.5–5.1)
SODIUM: 136 mmol/L (ref 135–145)
Total Protein: 7.5 g/dL (ref 6.5–8.1)

## 2016-01-30 LAB — URINALYSIS, ROUTINE W REFLEX MICROSCOPIC
Glucose, UA: NEGATIVE mg/dL
KETONES UR: NEGATIVE mg/dL
Nitrite: NEGATIVE
PH: 5.5 (ref 5.0–8.0)
Protein, ur: NEGATIVE mg/dL
SPECIFIC GRAVITY, URINE: 1.03 (ref 1.005–1.030)

## 2016-01-30 LAB — CBC
HEMATOCRIT: 39 % (ref 36.0–46.0)
HEMOGLOBIN: 13.1 g/dL (ref 12.0–15.0)
MCH: 31.2 pg (ref 26.0–34.0)
MCHC: 33.6 g/dL (ref 30.0–36.0)
MCV: 92.9 fL (ref 78.0–100.0)
Platelets: 204 10*3/uL (ref 150–400)
RBC: 4.2 MIL/uL (ref 3.87–5.11)
RDW: 13.4 % (ref 11.5–15.5)
WBC: 7.8 10*3/uL (ref 4.0–10.5)

## 2016-01-30 LAB — URINE MICROSCOPIC-ADD ON

## 2016-01-30 LAB — LIPASE, BLOOD: Lipase: 24 U/L (ref 11–51)

## 2016-01-30 MED ORDER — METOCLOPRAMIDE HCL 5 MG/ML IJ SOLN
10.0000 mg | Freq: Once | INTRAMUSCULAR | Status: AC
  Administered 2016-01-30: 10 mg via INTRAVENOUS
  Filled 2016-01-30: qty 2

## 2016-01-30 MED ORDER — SODIUM CHLORIDE 0.9 % IV BOLUS (SEPSIS)
1000.0000 mL | Freq: Once | INTRAVENOUS | Status: AC
  Administered 2016-01-30: 1000 mL via INTRAVENOUS

## 2016-01-30 MED ORDER — DIPHENHYDRAMINE HCL 50 MG/ML IJ SOLN: 25.0000 mg | Freq: Once | INTRAMUSCULAR | Status: DC

## 2016-01-30 MED ORDER — DIPHENHYDRAMINE HCL 50 MG/ML IJ SOLN
12.5000 mg | Freq: Once | INTRAMUSCULAR | Status: AC
  Administered 2016-01-30: 12.5 mg via INTRAVENOUS
  Filled 2016-01-30: qty 1

## 2016-01-30 MED ORDER — ACETAMINOPHEN 325 MG PO TABS
650.0000 mg | ORAL_TABLET | Freq: Once | ORAL | Status: AC
  Administered 2016-01-30: 650 mg via ORAL
  Filled 2016-01-30: qty 2

## 2016-01-30 MED ORDER — CEPHALEXIN 500 MG PO CAPS: 500.0000 mg | ORAL_CAPSULE | Freq: Two times a day (BID) | ORAL | Status: DC

## 2016-01-30 NOTE — ED Provider Notes (Signed)
CSN: [id number]     Arrival date & time 01/30/16  1439 History   First MD Initiated Contact with Patient 01/30/16 1503     Chief Complaint  Patient presents with  . Bloated  . Flank Pain   HPI  [name] [name] is a 70 y.o. female PMH significant for  presenting with a few day history of abdominal pain/distention, frequent urination and a headache. She endorses a fever of 102 Fahrenheit last night. She has associated nausea without emesis. She describes her abdominal pain and distention as chronic, as she has a history of IBS-D. She had trouble finding words last night, and some numbness in bilateral hands (she does have a history of neuropathy) which she attributes to her Raynaud's. No emesis, diarrhea.   Past Medical History  Diagnosis Date  . Palpitations   . Mitral valve disorders   . Diverticulitis of colon (without mention of hemorrhage)   . Internal hemorrhoids without mention of complication   . Diverticulosis of colon (without mention of hemorrhage)   . Cervical dysplasia 1976  . Elevated cholesterol   . MRSA infection   . Pelvic adhesive disease   . Prediabetes   . Vitamin D deficiency   . GERD (gastroesophageal reflux disease)   . Anemia   . Anxiety   . Arthritis     hands and spine  . Blood transfusion without reported diagnosis   . Heart murmur   . Osteoporosis     spine  . Tachycardia     never had HTN  . Hypertension     pt states she takes med for tachycardia NOT HTN -metoprolol taken for tachycardia   Past Surgical History  Procedure Laterality Date  . Tympanoplasty Right   . Rotator cuff repair Left   . Ectopic pregnancy surgery  1971, 1979  . Hemorrhoid surgery    . Oophorectomy    . Gynecologic cryosurgery  1976  . Abdominal hysterectomy  2000    TAH,BSO endometriosis, leiomyomata  . Rotator cuff repair Right 2/15  . Colonoscopy    . Esophagogastroduodenoscopy    . Upper gastrointestinal endoscopy    . Ganglion cyst excision Right     hand    . Finger surgery     Family History  Problem Relation Age of Onset  . Diabetes Mother   . Hypertension Mother   . Melanoma Mother   . Diabetes Father   . Heart disease Father   . Hyperlipidemia Father   . Hypertension Father   . Uterine cancer Sister     UTERINE  . Colon cancer Maternal Grandmother     dx in her 78's ?mets from lymphoma  . Stomach cancer Maternal Grandmother   . Heart disease Maternal Grandfather   . Heart disease Paternal Grandmother   . Heart disease Paternal Grandfather   . Esophageal cancer Neg Hx   . Rectal cancer Neg Hx   . Lymphoma Maternal Grandmother   . Colon polyps Sister   . Cirrhosis Father     alcoholic  . Kidney disease Neg Hx   . Pancreatic cancer Neg Hx    Social History  Substance Use Topics  . Smoking status: Former Smoker -- 0.50 packs/day for 30 years    Types: Cigarettes    Quit date: 05/16/2013  . Smokeless tobacco: Never Used     Comment: form given 05/12/15  . Alcohol Use: 0.6 oz/week    1 Standard drinks or equivalent per week  Comment: Occasionally few times per month   OB History    Gravida Para Term Preterm AB TAB SAB Ectopic Multiple Living   2 0   2   2  0     Review of Systems  Ten systems are reviewed and are negative for acute change except as noted in the HPI   Allergies  Bactrim and Ketek  Home Medications   Prior to Admission medications   Medication Sig Start Date End Date Taking? Authorizing Provider  atorvastatin (LIPITOR) 80 MG tablet Take 40 mg by mouth 3 (three) times a week. Tuesday, Thursday, and Saturday   Yes Historical Provider, MD  B Complex Vitamins (B COMPLEX PO) Take 1 tablet by mouth every other day.    Yes Historical Provider, MD  Cholecalciferol (VITAMIN D3) 5000 UNITS TABS Take 5,000 Units by mouth daily.    Yes Historical Provider, MD  LORazepam (ATIVAN) 1 MG tablet TAKE 1 TO 2 TABLETS BY MOUTH AT BEDTIME AS NEEDED FOR SLEEP 12/16/15  Yes Unk Pinto, MD  Magnesium 250 MG TABS  Take 250 mg by mouth daily.    Yes Historical Provider, MD  metoprolol tartrate (LOPRESSOR) 25 MG tablet Take 25 mg by mouth daily. 09/17/15  Yes Historical Provider, MD  omeprazole (PRILOSEC) 40 MG capsule Take 40 mg by mouth every other day.   Yes Historical Provider, MD  sertraline (ZOLOFT) 100 MG tablet TAKE 1 TABLET (100 MG TOTAL) BY MOUTH DAILY. 11/09/15  Yes Unk Pinto, MD   BP 127/61 mmHg  Pulse 90  Temp(Src) 99 F (37.2 C) (Oral)  Resp 16  SpO2 95% Physical Exam  Constitutional: She is oriented to person, place, and time. She appears well-developed and well-nourished. No distress.  HENT:  Head: Normocephalic and atraumatic.  Mouth/Throat: Oropharynx is clear and moist. No oropharyngeal exudate.  Eyes: Conjunctivae and EOM are normal. Pupils are equal, round, and reactive to light. Right eye exhibits no discharge. Left eye exhibits no discharge. No scleral icterus.  Neck: No tracheal deviation present.  Cardiovascular: Normal rate, regular rhythm, normal heart sounds and intact distal pulses.  Exam reveals no gallop and no friction rub.   No murmur heard. Pulmonary/Chest: Effort normal and breath sounds normal. No respiratory distress. She has no wheezes. She has no rales. She exhibits no tenderness.  Abdominal: Soft. Bowel sounds are normal. She exhibits no distension and no mass. There is no tenderness. There is no rebound and no guarding.  Musculoskeletal: She exhibits no edema or tenderness.  Lymphadenopathy:    She has no cervical adenopathy.  Neurological: She is alert and oriented to person, place, and time. No cranial nerve deficit. Coordination normal.  Cranial nerves II through XII grossly intact. Normal finger to nose, rapid alternating movements, pronator drift.  Skin: Skin is warm and dry. No rash noted. She is not diaphoretic. No erythema.  Psychiatric: She has a normal mood and affect. Her behavior is normal.  Nursing note and vitals reviewed.  ED Course   Procedures  Labs Review Labs Reviewed  COMPREHENSIVE METABOLIC PANEL - Abnormal; Notable for the following:    Glucose, Bld 101 (*)    All other components within normal limits  URINALYSIS, ROUTINE W REFLEX MICROSCOPIC (NOT AT [facility]) - Abnormal; Notable for the following:    Color, Urine AMBER (*)    APPearance CLOUDY (*)    Hgb urine dipstick LARGE (*)    Bilirubin Urine SMALL (*)    Leukocytes, UA MODERATE (*)  All other components within normal limits  URINE MICROSCOPIC-ADD ON - Abnormal; Notable for the following:    Squamous Epithelial / LPF 0-5 (*)    Bacteria, UA FEW (*)    All other components within normal limits  LIPASE, BLOOD  CBC   Imaging Review Ct Head Wo Contrast  01/30/2016  CLINICAL DATA:  Severe headache and nausea since last night. EXAM: CT HEAD WITHOUT CONTRAST TECHNIQUE: Contiguous axial images were obtained from the base of the skull through the vertex without intravenous contrast. COMPARISON:  None. FINDINGS: There is no evidence of intracranial hemorrhage, brain edema, or other signs of acute infarction. There is no evidence of intracranial mass lesion or mass effect. No abnormal extraaxial fluid collections are identified. Mild diffuse cerebral atrophy noted. No evidence of hydrocephalus. No skull abnormality identified. IMPRESSION: No acute intracranial abnormality.  Mild cerebral atrophy. Electronically Signed   By: Earle Gell M.D.   On: 01/30/2016 17:03   I have personally reviewed and evaluated these images and lab results as part of my medical decision-making.  MDM   Final diagnoses:  UTI (lower urinary tract infection)  Headache, unspecified headache type   CMP, CBC, lipase, CT head unremarkable.  UTI on UA.  Patient's HA resolved with HA cocktail. Doubt CVA, SAH.  Patient may be safely discharged home with keflex. Discussed reasons for return. Patient to follow-up with primary care provider within one week. Patient in understanding and agreement  with the plan.  Captain Cook Lions, PA-C 02/03/16 1250

## 2016-01-30 NOTE — ED Notes (Signed)
Pt c/o abdominal pain/distention, flank pain, frequent urination and an unbearable headache. Reports she had a fever of 102F last night. Reports nausea, but no vomiting or diarrhea.

## 2016-01-30 NOTE — Discharge Instructions (Signed)
Ms. ANANYA SAYRES,  Nice meeting you! Please follow-up with your primary care provider. Return to the emergency department if you develop fevers, chills, increased headaches, increased abdominal pain, new/worsening symptoms. Feel better soon!  S. Wendie Simmer, PA-C

## 2016-01-30 NOTE — ED Provider Notes (Signed)
Medical screening examination/treatment/procedure(s) were conducted as a shared visit with non-physician practitioner(s) and myself.  I personally evaluated the patient during the encounter.   EKG Interpretation None      Patient here after having a headache body ache as well as myalgias. She had some bilateral hand tingling associate with her Raynaud's. Urinalysis shows infection here. Will treat with oral antibiotics and follow-up with her doctor. No focal neurological deficits noted on her neurological exam.  Lacretia Leigh, MD 01/30/16 1902

## 2016-02-01 ENCOUNTER — Encounter: Payer: Self-pay | Admitting: Internal Medicine

## 2016-02-16 ENCOUNTER — Encounter: Payer: Self-pay | Admitting: Internal Medicine

## 2016-02-16 ENCOUNTER — Ambulatory Visit (INDEPENDENT_AMBULATORY_CARE_PROVIDER_SITE_OTHER): Payer: PPO | Admitting: Internal Medicine

## 2016-02-16 VITALS — BP 114/62 | HR 60 | Temp 98.0°F | Resp 16 | Ht 64.0 in | Wt 137.0 lb

## 2016-02-16 DIAGNOSIS — Z79899 Other long term (current) drug therapy: Secondary | ICD-10-CM | POA: Diagnosis not present

## 2016-02-16 DIAGNOSIS — R7309 Other abnormal glucose: Secondary | ICD-10-CM | POA: Diagnosis not present

## 2016-02-16 DIAGNOSIS — N3090 Cystitis, unspecified without hematuria: Secondary | ICD-10-CM

## 2016-02-16 DIAGNOSIS — I10 Essential (primary) hypertension: Secondary | ICD-10-CM

## 2016-02-16 DIAGNOSIS — G319 Degenerative disease of nervous system, unspecified: Secondary | ICD-10-CM | POA: Diagnosis not present

## 2016-02-16 DIAGNOSIS — E782 Mixed hyperlipidemia: Secondary | ICD-10-CM

## 2016-02-16 DIAGNOSIS — E559 Vitamin D deficiency, unspecified: Secondary | ICD-10-CM

## 2016-02-16 DIAGNOSIS — R7303 Prediabetes: Secondary | ICD-10-CM

## 2016-02-16 LAB — CBC WITH DIFFERENTIAL/PLATELET
BASOS ABS: 0 {cells}/uL (ref 0–200)
BASOS PCT: 0 %
Eosinophils Absolute: 246 cells/uL (ref 15–500)
Eosinophils Relative: 3 %
HEMATOCRIT: 36.8 % (ref 35.0–45.0)
HEMOGLOBIN: 12.3 g/dL (ref 11.7–15.5)
LYMPHS ABS: 2788 {cells}/uL (ref 850–3900)
Lymphocytes Relative: 34 %
MCH: 30.7 pg (ref 27.0–33.0)
MCHC: 33.4 g/dL (ref 32.0–36.0)
MCV: 91.8 fL (ref 80.0–100.0)
MONO ABS: 328 {cells}/uL (ref 200–950)
MPV: 9 fL (ref 7.5–12.5)
Monocytes Relative: 4 %
Neutro Abs: 4838 cells/uL (ref 1500–7800)
Neutrophils Relative %: 59 %
Platelets: 262 10*3/uL (ref 140–400)
RBC: 4.01 MIL/uL (ref 3.80–5.10)
RDW: 13.9 % (ref 11.0–15.0)
WBC: 8.2 10*3/uL (ref 3.8–10.8)

## 2016-02-16 NOTE — Progress Notes (Signed)
Assessment and Plan:  Hypertension:  -Continue medication,  -monitor blood pressure at home.  -Continue DASH diet.   -Reminder to go to the ER if any CP, SOB, nausea, dizziness, severe HA, changes vision/speech, left arm numbness and tingling, and jaw pain.  Cholesterol: -Continue diet and exercise.  -Check cholesterol.   Pre-diabetes: -Continue diet and exercise.  -Check A1C  Vitamin D Def: -check level -continue medications.   Recent UTI -recheck urine -abx were completed  Cerebral atrophy noted on CT head -discussed possible causes and reassured patient  Continue diet and meds as discussed. Further disposition pending results of labs.  HPI 70 y.o. female  presents for 3 month follow up with hypertension, hyperlipidemia, prediabetes and vitamin D.   Her blood pressure has been controlled at home, today their BP is BP: 114/62 mmHg.   She does workout. She denies chest pain, shortness of breath, dizziness.   She is on cholesterol medication and denies myalgias. Her cholesterol is not at goal. The cholesterol last visit was:   Lab Results  Component Value Date   CHOL 207* 11/11/2015   HDL 103 11/11/2015   LDLCALC 83 11/11/2015   TRIG 105 11/11/2015   CHOLHDL 2.0 11/11/2015     She has been working on diet and exercise for prediabetes, and denies foot ulcerations, hyperglycemia, hypoglycemia , increased appetite, nausea, paresthesia of the feet, polydipsia, polyuria, visual disturbances, vomiting and weight loss. Last A1C in the office was:  Lab Results  Component Value Date   HGBA1C 5.7* 11/11/2015    Patient is on Vitamin D supplement.  Lab Results  Component Value Date   VD25OH 57 11/11/2015     Patient reports that approximately 3 weeks ago she was feeling very badly and she went to 2 UC and she was sent to the ER.  She was diagnosed with a severe UTI and had a CT scan done due to confusion.  She reports that she was told that she told she had mild diffuse  cerebral atrophy which worried her.  She reports that she recently lost a neighbor and that has bothered her.    Current Medications:  Current Outpatient Prescriptions on File Prior to Visit  Medication Sig Dispense Refill  . atorvastatin (LIPITOR) 80 MG tablet Take 40 mg by mouth 3 (three) times a week. Tuesday, Thursday, and Saturday    . B Complex Vitamins (B COMPLEX PO) Take 1 tablet by mouth every other day.     . Cholecalciferol (VITAMIN D3) 5000 UNITS TABS Take 5,000 Units by mouth daily.     Marland Kitchen LORazepam (ATIVAN) 1 MG tablet TAKE 1 TO 2 TABLETS BY MOUTH AT BEDTIME AS NEEDED FOR SLEEP 60 tablet 4  . Magnesium 250 MG TABS Take 250 mg by mouth daily.     . metoprolol tartrate (LOPRESSOR) 25 MG tablet Take 25 mg by mouth daily.    Marland Kitchen omeprazole (PRILOSEC) 40 MG capsule Take 40 mg by mouth every other day.    . sertraline (ZOLOFT) 100 MG tablet TAKE 1 TABLET (100 MG TOTAL) BY MOUTH DAILY. 90 tablet 1   No current facility-administered medications on file prior to visit.    Medical History:  Past Medical History  Diagnosis Date  . Palpitations   . Mitral valve disorders   . Diverticulitis of colon (without mention of hemorrhage)   . Internal hemorrhoids without mention of complication   . Diverticulosis of colon (without mention of hemorrhage)   . Cervical dysplasia 1976  .  Elevated cholesterol   . MRSA infection   . Pelvic adhesive disease   . Prediabetes   . Vitamin D deficiency   . GERD (gastroesophageal reflux disease)   . Anemia   . Anxiety   . Arthritis     hands and spine  . Blood transfusion without reported diagnosis   . Heart murmur   . Osteoporosis     spine  . Tachycardia     never had HTN  . Hypertension     pt states she takes med for tachycardia NOT HTN -metoprolol taken for tachycardia    Allergies:  Allergies  Allergen Reactions  . Bactrim [Sulfamethoxazole-Trimethoprim] Swelling    Face and eyes swollen and red.  Marland Kitchen Ketek [Telithromycin]      Unknown     Review of Systems:  Review of Systems  Constitutional: Negative for fever, chills and malaise/fatigue.  HENT: Negative for congestion, ear pain and sore throat.   Respiratory: Negative for cough, shortness of breath and wheezing.   Cardiovascular: Negative for chest pain, claudication and leg swelling.  Gastrointestinal: Negative for heartburn, abdominal pain, diarrhea, constipation, blood in stool and melena.  Genitourinary: Negative for dysuria, urgency, frequency and hematuria.  Musculoskeletal: Negative.   Skin: Negative.   Neurological: Negative for dizziness, sensory change, loss of consciousness and headaches.  Psychiatric/Behavioral: Negative for depression. The patient is not nervous/anxious and does not have insomnia.     Family history- Review and unchanged  Social history- Review and unchanged  Physical Exam: BP 114/62 mmHg  Pulse 60  Temp(Src) 98 F (36.7 C) (Temporal)  Resp 16  Ht 5\' 4"  (1.626 m)  Wt 137 lb (62.143 kg)  BMI 23.50 kg/m2 Wt Readings from Last 3 Encounters:  02/16/16 137 lb (62.143 kg)  11/24/15 134 lb (60.782 kg)  11/11/15 135 lb 3.2 oz (61.326 kg)    General Appearance: Well nourished well developed, in no apparent distress. Eyes: PERRLA, EOMs, conjunctiva no swelling or erythema ENT/Mouth: Ear canals normal without obstruction, swelling, erythma, discharge.  TMs normal bilaterally.  Oropharynx moist, clear, without exudate, or postoropharyngeal swelling. Neck: Supple, thyroid normal,no cervical adenopathy  Respiratory: Respiratory effort normal, Breath sounds clear A&P without rhonchi, wheeze, or rale.  No retractions, no accessory usage. Cardio: RRR with no MRGs. Brisk peripheral pulses without edema.  Abdomen: Soft, + BS,  Non tender, no guarding, rebound, hernias, masses. Musculoskeletal: Full ROM, 5/5 strength, Normal gait Skin: Warm, dry without rashes, lesions, ecchymosis.  Neuro: Awake and oriented X 3, Cranial nerves  intact. Normal muscle tone, no cerebellar symptoms. Psych: Normal affect, Insight and Judgment appropriate.    Starlyn Skeans, PA-C 2:58 PM [facility]

## 2016-02-17 LAB — HEPATIC FUNCTION PANEL
ALBUMIN: 4.3 g/dL (ref 3.6–5.1)
ALK PHOS: 60 U/L (ref 33–130)
ALT: 10 U/L (ref 6–29)
AST: 13 U/L (ref 10–35)
BILIRUBIN TOTAL: 0.7 mg/dL (ref 0.2–1.2)
Bilirubin, Direct: 0.1 mg/dL (ref <=0.2)
Indirect Bilirubin: 0.6 mg/dL (ref 0.2–1.2)
TOTAL PROTEIN: 6.6 g/dL (ref 6.1–8.1)

## 2016-02-17 LAB — URINALYSIS, ROUTINE W REFLEX MICROSCOPIC
Bilirubin Urine: NEGATIVE
Glucose, UA: NEGATIVE
KETONES UR: NEGATIVE
Leukocytes, UA: NEGATIVE
NITRITE: NEGATIVE
PH: 5 (ref 5.0–8.0)
Protein, ur: NEGATIVE
Specific Gravity, Urine: 1.014 (ref 1.001–1.035)

## 2016-02-17 LAB — URINALYSIS, MICROSCOPIC ONLY
Bacteria, UA: NONE SEEN [HPF]
CASTS: NONE SEEN [LPF]
CRYSTALS: NONE SEEN [HPF]
RBC / HPF: NONE SEEN RBC/HPF (ref <=2)
Squamous Epithelial / LPF: NONE SEEN [HPF] (ref <=5)
WBC, UA: NONE SEEN WBC/HPF (ref <=5)
Yeast: NONE SEEN [HPF]

## 2016-02-17 LAB — LIPID PANEL
CHOL/HDL RATIO: 2 ratio (ref <=5.0)
Cholesterol: 196 mg/dL (ref 125–200)
HDL: 96 mg/dL (ref >=46)
LDL Cholesterol: 85 mg/dL (ref <130)
Triglycerides: 75 mg/dL (ref <150)
VLDL: 15 mg/dL (ref <30)

## 2016-02-17 LAB — HEMOGLOBIN A1C
HEMOGLOBIN A1C: 5.7 % — AB (ref <5.7)
MEAN PLASMA GLUCOSE: 117 mg/dL

## 2016-02-17 LAB — BASIC METABOLIC PANEL WITH GFR
BUN: 16 mg/dL (ref 7–25)
CHLORIDE: 101 mmol/L (ref 98–110)
CO2: 26 mmol/L (ref 20–31)
Calcium: 9.2 mg/dL (ref 8.6–10.4)
Creat: 0.78 mg/dL (ref 0.50–0.99)
GFR, Est African American: >89 mL/min (ref >=60)
GFR, Est Non African American: 78 mL/min (ref >=60)
GLUCOSE: 79 mg/dL (ref 65–99)
POTASSIUM: 4.3 mmol/L (ref 3.5–5.3)
Sodium: 137 mmol/L (ref 135–146)

## 2016-02-17 LAB — TSH: TSH: 1.33 m[IU]/L

## 2016-02-18 LAB — URINE CULTURE: Colony Count: 30000

## 2016-03-03 ENCOUNTER — Other Ambulatory Visit: Payer: Self-pay

## 2016-03-03 ENCOUNTER — Telehealth: Payer: Self-pay | Admitting: Cardiovascular Disease

## 2016-03-03 MED ORDER — METOPROLOL TARTRATE 25 MG PO TABS: 25.0000 mg | ORAL_TABLET | Freq: Every day | ORAL | Status: DC

## 2016-03-03 NOTE — Telephone Encounter (Signed)
Called to clarify pt's medication instructions for metoprolol.

## 2016-03-03 NOTE — Telephone Encounter (Signed)
°*  STAT* If patient is at the pharmacy, call can be transferred to refill team.   1. Which medications need to be refilled? (please list name of each medication and dose if known) Metoprolol 25mg  2. Which pharmacy/location (including street and city if local pharmacy) is medication to be sent to? [place]  3. Do they need a 30 day or 90 day supply? She wants continues refills

## 2016-03-22 DIAGNOSIS — D1800 Hemangioma unspecified site: Secondary | ICD-10-CM | POA: Diagnosis not present

## 2016-03-22 DIAGNOSIS — D225 Melanocytic nevi of trunk: Secondary | ICD-10-CM | POA: Diagnosis not present

## 2016-03-22 DIAGNOSIS — L821 Other seborrheic keratosis: Secondary | ICD-10-CM | POA: Diagnosis not present

## 2016-03-22 DIAGNOSIS — Z85828 Personal history of other malignant neoplasm of skin: Secondary | ICD-10-CM | POA: Diagnosis not present

## 2016-03-22 DIAGNOSIS — L400 Psoriasis vulgaris: Secondary | ICD-10-CM | POA: Diagnosis not present

## 2016-03-22 DIAGNOSIS — L814 Other melanin hyperpigmentation: Secondary | ICD-10-CM | POA: Diagnosis not present

## 2016-04-11 ENCOUNTER — Other Ambulatory Visit: Payer: Self-pay | Admitting: Physician Assistant

## 2016-04-26 ENCOUNTER — Encounter: Payer: Self-pay | Admitting: Physician Assistant

## 2016-05-03 ENCOUNTER — Encounter (HOSPITAL_COMMUNITY): Payer: Self-pay | Admitting: Emergency Medicine

## 2016-05-03 DIAGNOSIS — W5551XA Bitten by raccoon, initial encounter: Secondary | ICD-10-CM | POA: Insufficient documentation

## 2016-05-03 DIAGNOSIS — S71151A Open bite, right thigh, initial encounter: Secondary | ICD-10-CM | POA: Diagnosis not present

## 2016-05-03 DIAGNOSIS — F17210 Nicotine dependence, cigarettes, uncomplicated: Secondary | ICD-10-CM | POA: Insufficient documentation

## 2016-05-03 DIAGNOSIS — Z79899 Other long term (current) drug therapy: Secondary | ICD-10-CM | POA: Insufficient documentation

## 2016-05-03 DIAGNOSIS — Y939 Activity, unspecified: Secondary | ICD-10-CM | POA: Diagnosis not present

## 2016-05-03 DIAGNOSIS — Z23 Encounter for immunization: Secondary | ICD-10-CM | POA: Diagnosis not present

## 2016-05-03 DIAGNOSIS — Y999 Unspecified external cause status: Secondary | ICD-10-CM | POA: Diagnosis not present

## 2016-05-03 DIAGNOSIS — S71131A Puncture wound without foreign body, right thigh, initial encounter: Secondary | ICD-10-CM | POA: Diagnosis not present

## 2016-05-03 DIAGNOSIS — I10 Essential (primary) hypertension: Secondary | ICD-10-CM | POA: Insufficient documentation

## 2016-05-03 DIAGNOSIS — Y92009 Unspecified place in unspecified non-institutional (private) residence as the place of occurrence of the external cause: Secondary | ICD-10-CM | POA: Diagnosis not present

## 2016-05-03 NOTE — ED Triage Notes (Signed)
Pt. reported that she was bitten by a racoon this evening at home , presents with bite mark at right lateral thigh with no bleeding , denies pain /respirations unlabored , no fever or chills.

## 2016-05-04 ENCOUNTER — Emergency Department (HOSPITAL_COMMUNITY)
Admission: EM | Admit: 2016-05-04 | Discharge: 2016-05-04 | Disposition: A | Discharge status: Home / Self Care | Payer: PPO | Attending: Emergency Medicine | Admitting: Emergency Medicine

## 2016-05-04 DIAGNOSIS — W5551XA Bitten by raccoon, initial encounter: Secondary | ICD-10-CM

## 2016-05-04 MED ORDER — AMOXICILLIN-POT CLAVULANATE 875-125 MG PO TABS
1.0000 | ORAL_TABLET | Freq: Two times a day (BID) | ORAL | 0 refills | Status: DC
Start: 2016-05-04 — End: 2016-05-25

## 2016-05-04 MED ORDER — RABIES VACCINE, PCEC IM SUSR
1.0000 mL | Freq: Once | INTRAMUSCULAR | Status: AC
  Administered 2016-05-04: 1 mL via INTRAMUSCULAR
  Filled 2016-05-04: qty 1

## 2016-05-04 MED ORDER — RABIES IMMUNE GLOBULIN 150 UNIT/ML IM INJ
1200.0000 [IU] | INJECTION | Freq: Once | INTRAMUSCULAR | Status: AC
  Administered 2016-05-04: 1200 [IU] via INTRAMUSCULAR
  Filled 2016-05-04: qty 8

## 2016-05-04 NOTE — ED Provider Notes (Signed)
[place] DEPT Provider Note   CSN: [id number] Arrival date & time: 05/03/16  2155   By signing my name below, I, [name] [name], attest that this documentation has been prepared under the direction and in the presence of Merryl Hacker, MD . Electronically Signed: Estanislado [name], Scribe. 05/04/2016. 2:09 AM.   History   Chief Complaint Chief Complaint  Patient presents with  . Animal Bite    The history is provided by the patient. No language interpreter was used.   HPI Comments:  [name] [name] is a 70 y.o. female who presents to the Emergency Department, here due to a raccoon bite sustained at home 5 hours ago.  Pt states that she was bending over in the back yard and the raccoon jumped on the back of her thigh and bit her. Pt notes a bite mark on her R thigh that is moderately painful. Pt did not clean or dress the wounds. Pt states that her husband may have observed teeth marks, but his vision is limited. Pt was previously exposed to another raccoon in the 1990's and received injections. Tetanus is up to date. Pt denies pain elsewhere, SOB, fever or chills.   Past Medical History:  Diagnosis Date  . Anemia   . Anxiety   . Arthritis    hands and spine  . Blood transfusion without reported diagnosis   . Cervical dysplasia 1976  . Diverticulitis of colon (without mention of hemorrhage)   . Diverticulosis of colon (without mention of hemorrhage)   . Elevated cholesterol   . GERD (gastroesophageal reflux disease)   . Heart murmur   . Hypertension    pt states she takes med for tachycardia NOT HTN -metoprolol taken for tachycardia  . Internal hemorrhoids without mention of complication   . Mitral valve disorders   . MRSA infection   . Osteoporosis    spine  . Palpitations   . Pelvic adhesive disease   . Prediabetes   . Tachycardia    never had HTN  . Vitamin D deficiency     Patient Active Problem List   Diagnosis Date Noted  . Encounter for Medicare  annual wellness exam 04/27/2015  . Depression, controlled 11/27/2014  . Insomnia 11/27/2014  . Cervical disc disease 10/10/2014  . Medication management 10/02/2014  . Mixed hyperlipidemia 07/01/2014  . Hypertension   . Prediabetes   . Vitamin D deficiency   . Fibroid   . Mitral valve disorder 06/16/2009  . Diverticulitis of colon 05/06/2008    Past Surgical History:  Procedure Laterality Date  . ABDOMINAL HYSTERECTOMY  2000   TAH,BSO endometriosis, leiomyomata  . COLONOSCOPY    . [place]  . ESOPHAGOGASTRODUODENOSCOPY    . FINGER SURGERY    . GANGLION CYST EXCISION Right    hand   . GYNECOLOGIC CRYOSURGERY  1976  . HEMORRHOID SURGERY    . OOPHORECTOMY    . ROTATOR CUFF REPAIR Left   . ROTATOR CUFF REPAIR Right 2/15  . TYMPANOPLASTY Right   . UPPER GASTROINTESTINAL ENDOSCOPY      OB History    Gravida Para Term Preterm AB Living   2 0     2 0   SAB TAB Ectopic Multiple Live Births       2           Home Medications    Prior to Admission medications   Medication Sig Start Date End Date Taking? Authorizing Provider  atorvastatin (  LIPITOR) 80 MG tablet Take 40 mg by mouth 3 (three) times a week. Tuesday, Thursday, and Saturday   Yes Historical Provider, MD  Cholecalciferol (VITAMIN D3) 5000 UNITS TABS Take 5,000 Units by mouth daily.    Yes Historical Provider, MD  LORazepam (ATIVAN) 1 MG tablet TAKE 1 TO 2 TABLETS BY MOUTH AT BEDTIME AS NEEDED FOR SLEEP 12/16/15  Yes Unk Pinto, MD  Magnesium 250 MG TABS Take 250 mg by mouth daily.    Yes Historical Provider, MD  metoprolol tartrate (LOPRESSOR) 25 MG tablet Take 1 tablet (25 mg total) by mouth daily. 03/03/16  Yes Josue Hector, MD  omeprazole (PRILOSEC) 40 MG capsule Take 1 capsule (40 mg total) by mouth 2 (two) times daily. Patient taking differently: Take 40 mg by mouth every morning.  04/11/16  Yes Amy S Esterwood, PA-C  sertraline (ZOLOFT) 100 MG tablet TAKE 1 TABLET (100 MG TOTAL)  BY MOUTH DAILY. 11/09/15  Yes Unk Pinto, MD  amoxicillin-clavulanate (AUGMENTIN) 875-125 MG tablet Take 1 tablet by mouth every 12 (twelve) hours. 05/04/16   Merryl Hacker, MD  B Complex Vitamins (B COMPLEX PO) Take 1 tablet by mouth every other day.     Historical Provider, MD    Family History Family History  Problem Relation Age of Onset  . Diabetes Mother   . Hypertension Mother   . Melanoma Mother   . Diabetes Father   . Heart disease Father   . Hyperlipidemia Father   . Hypertension Father   . Cirrhosis Father     alcoholic  . Uterine cancer Sister     UTERINE  . Colon cancer Maternal Grandmother     dx in her 75's ?mets from lymphoma  . Stomach cancer Maternal Grandmother   . Lymphoma Maternal Grandmother   . Heart disease Maternal Grandfather   . Heart disease Paternal Grandmother   . Heart disease Paternal Grandfather   . Colon polyps Sister   . Esophageal cancer Neg Hx   . Rectal cancer Neg Hx   . Kidney disease Neg Hx   . Pancreatic cancer Neg Hx     Social History Social History  Substance Use Topics  . Smoking status: Current Some Day Smoker    Packs/day: 0.00    Years: 30.00    Types: Cigarettes  . Smokeless tobacco: Never Used  . Alcohol use Yes     Allergies   Bactrim [sulfamethoxazole-trimethoprim] and Ketek [telithromycin]   Review of Systems Review of Systems  Constitutional: Negative for chills and fever.  Respiratory: Negative for shortness of breath.   Skin: Positive for wound.  All other systems reviewed and are negative.    Physical Exam Updated Vital Signs BP 122/65   Pulse 68   Temp 98.4 F (36.9 C) (Oral)   Resp 20   Ht 5\' 4"  (1.626 m)   Wt 143 lb (64.9 kg)   SpO2 97%   BMI 24.55 kg/m   Physical Exam  Constitutional: She is oriented to person, place, and time. She appears well-developed and well-nourished.  HENT:  Head: Normocephalic and atraumatic.  Cardiovascular: Normal rate and regular rhythm.     Pulmonary/Chest: Effort normal. No respiratory distress.  Abdominal: Soft. There is no tenderness.  Neurological: She is alert and oriented to person, place, and time.  Skin: Skin is warm and dry.  2 puncture wounds noted right posterior thigh, mild erythema and no additional injuries noted  Psychiatric: She has a normal mood and affect.  Nursing note and vitals reviewed.    ED Treatments / Results  DIAGNOSTIC STUDIES:  Oxygen Saturation is 96% on RA, adequate by my interpretation.    COORDINATION OF CARE:  2:15 AM Discussed treatment plan with pt at bedside and pt agreed to plan.  Labs (all labs ordered are listed, but only abnormal results are displayed) Labs Reviewed - No data to display  EKG  EKG Interpretation None       Radiology No results found.  Procedures Procedures (including critical care time)  Medications Ordered in ED Medications  rabies vaccine (RABAVERT) injection 1 mL (1 mL Intramuscular Given 05/04/16 0340)  rabies immune globulin (HYPERAB) injection 1,200 Units (1,200 Units Intramuscular Given 05/04/16 0340)     Initial Impression / Assessment and Plan / ED Course  I have reviewed the triage vital signs and the nursing notes.  Pertinent labs & imaging results that were available during my care of the patient were reviewed by me and considered in my medical decision making (see chart for details).  Clinical Course   Patient presents with reported raccoon bite. She has sustained multiple bites in the past and has had multiple rabies vaccinations. Tetanus is up-to-date. Rabies immunoglobulin and vaccination ordered. She will be set up with urgent care for follow-up and repeat immunization.  After history, exam, and medical workup I feel the patient has been appropriately medically screened and is safe for discharge home. Pertinent diagnoses were discussed with the patient. Patient was given return precautions.   Final Clinical Impressions(s) / ED  Diagnoses   Final diagnoses:  Raccoon bite    New Prescriptions Discharge Medication List as of 05/04/2016  5:34 AM    START taking these medications   Details  amoxicillin-clavulanate (AUGMENTIN) 875-125 MG tablet Take 1 tablet by mouth every 12 (twelve) hours., Starting Wed 05/04/2016, Print       I personally performed the services described in this documentation, which was scribed in my presence. The recorded information has been reviewed and is accurate.     Merryl Hacker, MD 05/04/16 2257

## 2016-05-07 ENCOUNTER — Encounter (HOSPITAL_COMMUNITY): Payer: Self-pay | Admitting: Emergency Medicine

## 2016-05-07 ENCOUNTER — Ambulatory Visit (HOSPITAL_COMMUNITY)
Admission: EM | Admit: 2016-05-07 | Discharge: 2016-05-07 | Disposition: A | Discharge status: Home / Self Care | Payer: PPO | Attending: Emergency Medicine | Admitting: Emergency Medicine

## 2016-05-07 DIAGNOSIS — W5551XD Bitten by raccoon, subsequent encounter: Secondary | ICD-10-CM

## 2016-05-07 DIAGNOSIS — Z2914 Encounter for prophylactic rabies immune globin: Secondary | ICD-10-CM

## 2016-05-07 DIAGNOSIS — Z203 Contact with and (suspected) exposure to rabies: Secondary | ICD-10-CM | POA: Diagnosis not present

## 2016-05-07 MED ORDER — RABIES VACCINE, PCEC IM SUSR
1.0000 mL | Freq: Once | INTRAMUSCULAR | Status: AC
  Administered 2016-05-07: 1 mL via INTRAMUSCULAR

## 2016-05-07 MED ORDER — RABIES VACCINE, PCEC IM SUSR
INTRAMUSCULAR | Status: AC
  Filled 2016-05-07: qty 1

## 2016-05-07 NOTE — Discharge Instructions (Signed)
Please return on 9/13 for day 7. Thank you

## 2016-05-07 NOTE — ED Triage Notes (Signed)
Here for rabies vaccination day #3  Voices no new concerns... A&O x4... NAD 

## 2016-05-15 ENCOUNTER — Other Ambulatory Visit: Payer: Self-pay | Admitting: Internal Medicine

## 2016-05-16 ENCOUNTER — Other Ambulatory Visit: Payer: Self-pay | Admitting: *Deleted

## 2016-05-16 DIAGNOSIS — G4700 Insomnia, unspecified: Secondary | ICD-10-CM

## 2016-05-16 MED ORDER — LORAZEPAM 1 MG PO TABS: 1.0000 mg | ORAL_TABLET | Freq: Every evening | ORAL | 5 refills | Status: DC | PRN

## 2016-05-25 ENCOUNTER — Encounter: Payer: Self-pay | Admitting: Physician Assistant

## 2016-05-25 ENCOUNTER — Ambulatory Visit (INDEPENDENT_AMBULATORY_CARE_PROVIDER_SITE_OTHER): Payer: PPO | Admitting: Physician Assistant

## 2016-05-25 VITALS — BP 128/70 | HR 67 | Temp 98.4°F | Resp 14 | Ht 64.0 in | Wt 140.8 lb

## 2016-05-25 DIAGNOSIS — I10 Essential (primary) hypertension: Secondary | ICD-10-CM | POA: Diagnosis not present

## 2016-05-25 DIAGNOSIS — E782 Mixed hyperlipidemia: Secondary | ICD-10-CM | POA: Diagnosis not present

## 2016-05-25 DIAGNOSIS — Z0001 Encounter for general adult medical examination with abnormal findings: Secondary | ICD-10-CM | POA: Diagnosis not present

## 2016-05-25 DIAGNOSIS — M5090 Cervical disc disorder, unspecified, unspecified cervical region: Secondary | ICD-10-CM

## 2016-05-25 DIAGNOSIS — Z0000 Encounter for general adult medical examination without abnormal findings: Secondary | ICD-10-CM

## 2016-05-25 DIAGNOSIS — L409 Psoriasis, unspecified: Secondary | ICD-10-CM

## 2016-05-25 DIAGNOSIS — R7309 Other abnormal glucose: Secondary | ICD-10-CM | POA: Diagnosis not present

## 2016-05-25 DIAGNOSIS — K5732 Diverticulitis of large intestine without perforation or abscess without bleeding: Secondary | ICD-10-CM

## 2016-05-25 DIAGNOSIS — I059 Rheumatic mitral valve disease, unspecified: Secondary | ICD-10-CM

## 2016-05-25 DIAGNOSIS — R6889 Other general symptoms and signs: Secondary | ICD-10-CM | POA: Diagnosis not present

## 2016-05-25 DIAGNOSIS — E559 Vitamin D deficiency, unspecified: Secondary | ICD-10-CM

## 2016-05-25 DIAGNOSIS — L4050 Arthropathic psoriasis, unspecified: Secondary | ICD-10-CM

## 2016-05-25 DIAGNOSIS — Z79899 Other long term (current) drug therapy: Secondary | ICD-10-CM

## 2016-05-25 DIAGNOSIS — G4700 Insomnia, unspecified: Secondary | ICD-10-CM

## 2016-05-25 DIAGNOSIS — F3342 Major depressive disorder, recurrent, in full remission: Secondary | ICD-10-CM

## 2016-05-25 DIAGNOSIS — E2839 Other primary ovarian failure: Secondary | ICD-10-CM

## 2016-05-25 DIAGNOSIS — D259 Leiomyoma of uterus, unspecified: Secondary | ICD-10-CM

## 2016-05-25 DIAGNOSIS — G609 Hereditary and idiopathic neuropathy, unspecified: Secondary | ICD-10-CM

## 2016-05-25 HISTORY — DX: Arthropathic psoriasis, unspecified: L40.50

## 2016-05-25 LAB — BASIC METABOLIC PANEL WITH GFR
BUN: 13 mg/dL (ref 7–25)
CHLORIDE: 106 mmol/L (ref 98–110)
CO2: 23 mmol/L (ref 20–31)
Calcium: 9.1 mg/dL (ref 8.6–10.4)
Creat: 0.87 mg/dL (ref 0.50–0.99)
GFR, Est African American: 79 mL/min (ref >=60)
GFR, Est Non African American: 69 mL/min (ref >=60)
Glucose, Bld: 80 mg/dL (ref 65–99)
POTASSIUM: 4.3 mmol/L (ref 3.5–5.3)
SODIUM: 140 mmol/L (ref 135–146)

## 2016-05-25 LAB — HEPATIC FUNCTION PANEL
ALK PHOS: 47 U/L (ref 33–130)
ALT: 15 U/L (ref 6–29)
AST: 18 U/L (ref 10–35)
Albumin: 4 g/dL (ref 3.6–5.1)
BILIRUBIN DIRECT: 0.1 mg/dL (ref <=0.2)
BILIRUBIN TOTAL: 0.5 mg/dL (ref 0.2–1.2)
Indirect Bilirubin: 0.4 mg/dL (ref 0.2–1.2)
Total Protein: 6.3 g/dL (ref 6.1–8.1)

## 2016-05-25 LAB — CBC WITH DIFFERENTIAL/PLATELET
BASOS PCT: 0 %
Basophils Absolute: 0 cells/uL (ref 0–200)
EOS ABS: 276 {cells}/uL (ref 15–500)
Eosinophils Relative: 4 %
HCT: 35.3 % (ref 35.0–45.0)
HEMOGLOBIN: 11.8 g/dL (ref 11.7–15.5)
LYMPHS ABS: 2208 {cells}/uL (ref 850–3900)
Lymphocytes Relative: 32 %
MCH: 30.5 pg (ref 27.0–33.0)
MCHC: 33.4 g/dL (ref 32.0–36.0)
MCV: 91.2 fL (ref 80.0–100.0)
MONOS PCT: 6 %
MPV: 8.8 fL (ref 7.5–12.5)
Monocytes Absolute: 414 cells/uL (ref 200–950)
NEUTROS ABS: 4002 {cells}/uL (ref 1500–7800)
Neutrophils Relative %: 58 %
PLATELETS: 258 10*3/uL (ref 140–400)
RBC: 3.87 MIL/uL (ref 3.80–5.10)
RDW: 14.4 % (ref 11.0–15.0)
WBC: 6.9 10*3/uL (ref 3.8–10.8)

## 2016-05-25 LAB — TSH: TSH: 1.73 mIU/L

## 2016-05-25 LAB — LIPID PANEL
CHOL/HDL RATIO: 2.6 ratio (ref <=5.0)
Cholesterol: 206 mg/dL — ABNORMAL HIGH (ref 125–200)
HDL: 80 mg/dL (ref >=46)
LDL CALC: 106 mg/dL (ref <130)
Triglycerides: 100 mg/dL (ref <150)
VLDL: 20 mg/dL (ref <30)

## 2016-05-25 NOTE — Patient Instructions (Signed)

## 2016-05-25 NOTE — Progress Notes (Signed)
Complete Physical  Assessment and Plan:  Essential hypertension - continue medications, DASH diet, exercise and monitor at home. Call if greater than 130/80.  - CBC with Differential/Platelet - BASIC METABOLIC PANEL WITH GFR - Hepatic function panel - TSH - Urinalysis, Routine w reflex microscopic (not at [facility]) - Microalbumin / creatinine urine ratio - EKG 12-Lead   Prediabetes Discussed general issues about diabetes pathophysiology and management., Educational material distributed., Suggested low cholesterol diet., Encouraged aerobic exercise., Discussed foot care., Reminded to get yearly retinal exam. - Hemoglobin A1c  Mixed hyperlipidemia -continue medications, check lipids, decrease fatty foods, increase activity.  - Lipid panel  Vitamin D deficiency - Vit D  25 hydroxy (rtn osteoporosis monitoring)   Medication management - Magnesium   Mitral valve disorder No symptoms at this time.   Diverticulitis of colon Monitor, increase fiber  Cervical disc disease Exercises, RICE   Uterine leiomyoma, unspecified location S/p hysterectomy  Insomnia good sleep hygiene discussed, increase day time activity, continue medication  Depression, remission Continue zoloft, suggest counseling, stress management techniques discussed, increase water, good sleep hygiene discussed, increase exercise, and increase veggies.    Encounter for general adult medical examination with abnormal findings Get MGM - CBC with Differential/Platelet - BASIC METABOLIC PANEL WITH GFR - Hepatic function panel - Lipid panel - Hemoglobin A1c - TSH - Insulin, fasting - Magnesium - Vit D  25 hydroxy (rtn osteoporosis monitoring) - Urinalysis, Routine w reflex microscopic (not at [facility]) - Microalbumin / creatinine urine ratio - EKG 12-Lead  Psoriasis Follows with derm, on cream  Neuropathy Hands and feet, follows neuro  Discussed med's effects and SE's. Screening labs and tests as requested  with regular follow-up as recommended. Over 40 minutes of exam, counseling, chart review, and complex, high level critical decision making was performed this visit.   HPI  70 y.o. female  presents for a complete physical.  Her blood pressure has been controlled at home, today their BP is BP: 128/70 She does workout. She denies chest pain, shortness of breath, dizziness.  She is on cholesterol medication and denies myalgias. Her cholesterol is at goal. The cholesterol last visit was:   Lab Results  Component Value Date   CHOL 196 02/16/2016   HDL 96 02/16/2016   LDLCALC 85 02/16/2016   TRIG 75 02/16/2016   CHOLHDL 2.0 02/16/2016   She has been working on diet and exercise for prediabetes,  and denies paresthesia of the feet, polydipsia, polyuria and visual disturbances. Last A1C in the office was:  Lab Results  Component Value Date   HGBA1C 5.7 (H) 02/16/2016   Patient is on Vitamin D supplement.   Lab Results  Component Value Date   VD25OH 33 11/11/2015     She is on ativan for sleep and zoloft for depression, doing well.   She had EGD in  July with Dr. Deatra Ina, had dilitation of stricture and was put on dexilant, not on prilosec.  T[facility], bitten by [place] and went to the ER in Sept, and got update on rabies vaccines, works with [employer].    Current Medications:  Current Outpatient Prescriptions on File Prior to Visit  Medication Sig Dispense Refill  . atorvastatin (LIPITOR) 80 MG tablet Take 40 mg by mouth 3 (three) times a week. Tuesday, Thursday, and Saturday    . B Complex Vitamins (B COMPLEX PO) Take 1 tablet by mouth every other day.     . Cholecalciferol (VITAMIN D3)  5000 UNITS TABS Take 5,000 Units by mouth daily.     Marland Kitchen LORazepam (ATIVAN) 1 MG tablet Take 1-2 tablets (1-2 mg total) by mouth at bedtime as needed. for sleep 60 tablet 5  . Magnesium 250 MG TABS Take 250 mg by mouth daily.     . metoprolol tartrate (LOPRESSOR) 25 MG tablet  Take 1 tablet (25 mg total) by mouth daily. 30 tablet 11  . omeprazole (PRILOSEC) 40 MG capsule Take 1 capsule (40 mg total) by mouth 2 (two) times daily. (Patient taking differently: Take 40 mg by mouth every morning. ) 30 capsule 1  . sertraline (ZOLOFT) 100 MG tablet TAKE 1 TABLET (100 MG TOTAL) BY MOUTH DAILY. 90 tablet 1   No current facility-administered medications on file prior to visit.    Health Maintenance:   Immunization History  Administered Date(s) Administered  . DT 04/27/2015  . Pneumococcal Conjugate-13 04/27/2015  . Pneumococcal Polysaccharide-23 12/22/2004  . Rabies, IM 05/04/2016, 05/07/2016  . Td 12/16/2003   Last colonoscopy: 2008, normal, due 2018 EGD 02/2015 Flex sig 2011 Dr. Deatra Ina Last mammogram: 08/2015, CAT B Last pap smear/pelvic exam: 11/2012 , 1 abnormal in her 20's DEXA: at OB/GYN-08/2015 Echo 2009 normal EF Ct AB 11/2014  Prior vaccinations: TD or Tdap: 2016 Influenza: declines Pneumococcal: 2006 Prevnar 13: 2016 Shingles/Zostavax: declines  Last Dental Exam: Dr. Yaakov Guthrie, q 4 months Last Eye Exam: Dr. Sabra Heck, Feb 2016 Patient Care Team: Unk Pinto, MD as PCP - General (Internal Medicine) Justice Britain, MD as Consulting Physician (Orthopedic Surgery) Iran Planas, MD as Consulting Physician (Orthopedic Surgery) Thornell Sartorius, MD as Consulting Physician (Otolaryngology) Josue Hector, MD as Consulting Physician (Cardiology) Rana Snare, MD as Consulting Physician (Urology) Anastasio Auerbach, MD as Consulting Physician (Gynecology) Devra Dopp, MD as Referring Physician (Dermatology) Marica Otter, OD (Optometry) Merrilee Jansky , DDS  Medical History:  Past Medical History:  Diagnosis Date  . Anemia   . Anxiety   . Arthritis    hands and spine  . Blood transfusion without reported diagnosis   . Cervical dysplasia 1976  . Diverticulitis of colon (without mention of hemorrhage)   . Diverticulosis of colon (without  mention of hemorrhage)   . Elevated cholesterol   . GERD (gastroesophageal reflux disease)   . Heart murmur   . Hypertension    pt states she takes med for tachycardia NOT HTN -metoprolol taken for tachycardia  . Internal hemorrhoids without mention of complication   . Mitral valve disorders   . MRSA infection   . Osteoporosis    spine  . Palpitations   . Pelvic adhesive disease   . Prediabetes   . Tachycardia    never had HTN  . Vitamin D deficiency    Allergies Allergies  Allergen Reactions  . Bactrim [Sulfamethoxazole-Trimethoprim] Swelling    Face and eyes swollen and red.  Marland Kitchen Ketek [Telithromycin]     Unknown    SURGICAL HISTORY She  has a past surgical history that includes Tympanoplasty (Right); Rotator cuff repair (Left); Ectopic pregnancy surgery (1971, 1979); Hemorrhoid surgery; Oophorectomy; Gynecologic cryosurgery (1976); Abdominal hysterectomy (2000); Rotator cuff repair (Right, 2/15); Colonoscopy; Esophagogastroduodenoscopy; Upper gastrointestinal endoscopy; Ganglion cyst excision (Right); and Finger surgery. FAMILY HISTORY Her family history includes Cirrhosis in her father; Colon cancer in her maternal grandmother; Colon polyps in her sister; Diabetes in her father and mother; Heart disease in her father, maternal grandfather, paternal grandfather, and paternal grandmother; Hyperlipidemia in her father; Hypertension in her  father and mother; Lymphoma in her maternal grandmother; Melanoma in her mother; Stomach cancer in her maternal grandmother; Uterine cancer in her sister. SOCIAL HISTORY She  reports that she has been smoking Cigarettes.  She has been smoking about 0.00 packs per day for the past 30.00 years. She has never used smokeless tobacco. She reports that she drinks alcohol. She reports that she does not use drugs.  Review of Systems: Review of Systems  Constitutional: Negative.   HENT: Negative.   Eyes: Negative.   Respiratory: Negative.  Negative for  shortness of breath.   Cardiovascular: Negative.  Negative for chest pain and palpitations.  Gastrointestinal: Negative.   Genitourinary: Negative.   Musculoskeletal: Negative.  Negative for falls.  Skin: Negative.   Neurological: Negative.   Psychiatric/Behavioral: Positive for depression. Negative for hallucinations, memory loss, substance abuse and suicidal ideas. The patient is not nervous/anxious and does not have insomnia.     Physical Exam: Estimated body mass index is 24.17 kg/m as calculated from the following:   Height as of this encounter: 5\' 4"  (1.626 m).   Weight as of this encounter: 140 lb 12.8 oz (63.9 kg). BP 128/70   Pulse 67   Temp 98.4 F (36.9 C)   Resp 14   Ht 5\' 4"  (1.626 m)   Wt 140 lb 12.8 oz (63.9 kg)   SpO2 97%   BMI 24.17 kg/m  General Appearance: Well nourished, in no apparent distress.  Eyes: PERRLA, EOMs, conjunctiva no swelling or erythema, normal fundi and vessels.  Sinuses: No Frontal/maxillary tenderness  ENT/Mouth: Ext aud canals clear, normal light reflex with TMs without erythema, bulging. Good dentition. No erythema, swelling, or exudate on post pharynx. Tonsils not swollen or erythematous. Hearing normal.  Neck: Supple, thyroid normal. No bruits  Respiratory: Respiratory effort normal, BS equal bilaterally without rales, rhonchi, wheezing or stridor.  Cardio: RRR without murmurs, rubs or gallops. Brisk peripheral pulses without edema.  Chest: symmetric, with normal excursions and percussion.  Breasts: defer Abdomen: Soft, nontender, no guarding, rebound, hernias, masses, or organomegaly.  Lymphatics: Non tender without lymphadenopathy.  Genitourinary: defer Musculoskeletal: Full ROM all peripheral extremities,5/5 strength, and normal gait.  Skin: Warm, dry without rashes, lesions, ecchymosis. Neuro: Cranial nerves intact, reflexes equal bilaterally. Normal muscle tone, no cerebellar symptoms. Sensation intact.  Psych: Awake and oriented  X 3, normal affect, Insight and Judgment appropriate.   EKG: WNL no ST changes. AORTA SCAN: defer  Vicie Mutters 2:12 PM [facility]

## 2016-05-26 LAB — URINALYSIS, MICROSCOPIC ONLY
BACTERIA UA: NONE SEEN [HPF]
CRYSTALS: NONE SEEN [HPF]
Casts: NONE SEEN [LPF]
RBC / HPF: NONE SEEN RBC/HPF (ref <=2)
SQUAMOUS EPITHELIAL / LPF: NONE SEEN [HPF] (ref <=5)
WBC UA: NONE SEEN WBC/HPF (ref <=5)
Yeast: NONE SEEN [HPF]

## 2016-05-26 LAB — URINALYSIS, ROUTINE W REFLEX MICROSCOPIC
Bilirubin Urine: NEGATIVE
Glucose, UA: NEGATIVE
KETONES UR: NEGATIVE
LEUKOCYTES UA: NEGATIVE
NITRITE: NEGATIVE
PH: 5.5 (ref 5.0–8.0)
Protein, ur: NEGATIVE
Specific Gravity, Urine: 1.02 (ref 1.001–1.035)

## 2016-05-26 LAB — MICROALBUMIN / CREATININE URINE RATIO
Creatinine, Urine: 169 mg/dL (ref 20–320)
MICROALB UR: 0.4 mg/dL
Microalb Creat Ratio: 2 mcg/mg creat (ref <30)

## 2016-05-26 LAB — HEMOGLOBIN A1C
Hgb A1c MFr Bld: 5.3 % (ref <5.7)
Mean Plasma Glucose: 105 mg/dL

## 2016-08-16 ENCOUNTER — Other Ambulatory Visit: Payer: Self-pay | Admitting: Physician Assistant

## 2016-08-24 ENCOUNTER — Encounter: Payer: Self-pay | Admitting: Internal Medicine

## 2016-08-24 ENCOUNTER — Ambulatory Visit (INDEPENDENT_AMBULATORY_CARE_PROVIDER_SITE_OTHER): Payer: PPO | Admitting: Internal Medicine

## 2016-08-24 VITALS — BP 124/72 | HR 64 | Temp 97.8°F | Resp 16 | Ht 64.0 in | Wt 144.8 lb

## 2016-08-24 DIAGNOSIS — I10 Essential (primary) hypertension: Secondary | ICD-10-CM | POA: Diagnosis not present

## 2016-08-24 DIAGNOSIS — Z79899 Other long term (current) drug therapy: Secondary | ICD-10-CM

## 2016-08-24 DIAGNOSIS — E559 Vitamin D deficiency, unspecified: Secondary | ICD-10-CM

## 2016-08-24 DIAGNOSIS — R7309 Other abnormal glucose: Secondary | ICD-10-CM

## 2016-08-24 DIAGNOSIS — R7303 Prediabetes: Secondary | ICD-10-CM

## 2016-08-24 DIAGNOSIS — E782 Mixed hyperlipidemia: Secondary | ICD-10-CM | POA: Diagnosis not present

## 2016-08-24 DIAGNOSIS — K2100 Gastro-esophageal reflux disease with esophagitis, without bleeding: Secondary | ICD-10-CM

## 2016-08-24 LAB — CBC WITH DIFFERENTIAL/PLATELET
BASOS ABS: 0 {cells}/uL (ref 0–200)
Basophils Relative: 0 %
EOS PCT: 3 %
Eosinophils Absolute: 201 cells/uL (ref 15–500)
HEMATOCRIT: 36 % (ref 35.0–45.0)
HEMOGLOBIN: 11.7 g/dL (ref 11.7–15.5)
LYMPHS ABS: 2077 {cells}/uL (ref 850–3900)
Lymphocytes Relative: 31 %
MCH: 30.6 pg (ref 27.0–33.0)
MCHC: 32.5 g/dL (ref 32.0–36.0)
MCV: 94.2 fL (ref 80.0–100.0)
MONO ABS: 402 {cells}/uL (ref 200–950)
MPV: 8.9 fL (ref 7.5–12.5)
Monocytes Relative: 6 %
NEUTROS ABS: 4020 {cells}/uL (ref 1500–7800)
NEUTROS PCT: 60 %
Platelets: 233 10*3/uL (ref 140–400)
RBC: 3.82 MIL/uL (ref 3.80–5.10)
RDW: 14.7 % (ref 11.0–15.0)
WBC: 6.7 10*3/uL (ref 3.8–10.8)

## 2016-08-24 NOTE — Progress Notes (Signed)
[facility] Unk Pinto, M.D.        Uvaldo Bristle. Silverio Lay, P.A.-C       Starlyn Skeans, P.A.-C  [facility]                [address] [place], [place] [id number] Telephone [phone] Telefax [phone] ______________________________________________________________________     This very nice 70 y.o. MWF presents for 3 month follow up with Hypertension, Hyperlipidemia, Pre-Diabetes and Vitamin D Deficiency. Patient also has hx/o GERD controlled with her Omeprazole - now tapered to QOD.      Patient is treated for HTN circa 1998 when she was evaluated for palpitations & was dx'd w/ MVP.  BP has been controlled on Metoprolol and today's BP is at goal - 124/72. Patient has had no complaints of any cardiac type chest pain, palpitations, dyspnea/orthopnea/PND, dizziness, claudication, or dependent edema.     Hyperlipidemia is controlled with diet & meds. Patient denies myalgias or other med SE's. Last Lipids were near, but not at goal: Lab Results  Component Value Date   CHOL 206 (H) 05/25/2016   HDL 80 05/25/2016   LDLCALC 106 05/25/2016   TRIG 100 05/25/2016   CHOLHDL 2.6 05/25/2016      Also, the patient has history of PreDiabetes with A1c 6.0% in 2015 and has had no symptoms of reactive hypoglycemia, diabetic polys, paresthesias or visual blurring.  Last A1c was at goal: Lab Results  Component Value Date   HGBA1C 5.3 05/25/2016      Further, the patient also has history of Vitamin D Deficiency and supplements vitamin D without any suspected side-effects. Last vitamin D was at goal: Lab Results  Component Value Date   VD25OH 57 11/11/2015   Current Outpatient Prescriptions on File Prior to Visit  Medication Sig  . atorvastatin (LIPITOR) 80 MG tablet Take 40 mg by mouth 3 (three) times a week. Tuesday, Thursday, and Saturday  . B Complex Vitamins (B COMPLEX PO) Take 1 tablet by mouth every  other day.   . Cholecalciferol (VITAMIN D3) 5000 UNITS TABS Take 5,000 Units by mouth daily.   Marland Kitchen LORazepam (ATIVAN) 1 MG tablet Take 1-2 tablets (1-2 mg total) by mouth at bedtime as needed. for sleep  . Magnesium 250 MG TABS Take 250 mg by mouth daily.   . metoprolol tartrate (LOPRESSOR) 25 MG tablet Take 1 tablet (25 mg total) by mouth daily.  Marland Kitchen omeprazole (PRILOSEC) 40 MG capsule TAKE ONE CAPSULE BY MOUTH TWICE A DAY  . sertraline (ZOLOFT) 100 MG tablet TAKE 1 TABLET (100 MG TOTAL) BY MOUTH DAILY.   No current facility-administered medications on file prior to visit.    Allergies  Allergen Reactions  . Bactrim [Sulfamethoxazole-Trimethoprim] Swelling    Face and eyes swollen and red.  Marland Kitchen Ketek [Telithromycin]     Unknown   PMHx:   Past Medical History:  Diagnosis Date  . Anemia   . Anxiety   . Arthritis    hands and spine  . Blood transfusion without reported diagnosis   . Cervical dysplasia 1976  . Diverticulitis of colon (without mention of hemorrhage)   . Diverticulosis of colon (without mention of hemorrhage)   . Elevated cholesterol   . GERD (gastroesophageal reflux disease)   . Heart murmur   . Hypertension    pt states she takes med for tachycardia NOT HTN -  metoprolol taken for tachycardia  . Internal hemorrhoids without mention of complication   . Mitral valve disorders   . MRSA infection   . Osteoporosis    spine  . Palpitations   . Pelvic adhesive disease   . Prediabetes   . Tachycardia   . Vitamin D deficiency    Immunization History  Administered Date(s) Administered  . DT 04/27/2015  . Pneumococcal Conjugate-13 04/27/2015  . Pneumococcal Polysaccharide-23 12/22/2004  . Rabies, IM 05/04/2016, 05/07/2016  . Td 12/16/2003   Past Surgical History:  Procedure Laterality Date  . ABDOMINAL HYSTERECTOMY  2000   TAH,BSO endometriosis, leiomyomata  . COLONOSCOPY    . [place]  . ESOPHAGOGASTRODUODENOSCOPY    . FINGER SURGERY     . GANGLION CYST EXCISION Right    hand   . GYNECOLOGIC CRYOSURGERY  1976  . HEMORRHOID SURGERY    . OOPHORECTOMY    . ROTATOR CUFF REPAIR Left   . ROTATOR CUFF REPAIR Right 2/15  . TYMPANOPLASTY Right   . UPPER GASTROINTESTINAL ENDOSCOPY     FHx:    Reviewed / unchanged  SHx:    Reviewed / unchanged  Systems Review:  Constitutional: Denies fever, chills, wt changes, headaches, insomnia, fatigue, night sweats, change in appetite. Eyes: Denies redness, blurred vision, diplopia, discharge, itchy, watery eyes.  ENT: Denies discharge, congestion, post nasal drip, epistaxis, sore throat, earache, hearing loss, dental pain, tinnitus, vertigo, sinus pain, snoring.  CV: Denies chest pain, palpitations, irregular heartbeat, syncope, dyspnea, diaphoresis, orthopnea, PND, claudication or edema. Respiratory: denies cough, dyspnea, DOE, pleurisy, hoarseness, laryngitis, wheezing.  Gastrointestinal: Denies dysphagia, odynophagia, heartburn, reflux, water brash, abdominal pain or cramps, nausea, vomiting, bloating, diarrhea, constipation, hematemesis, melena, hematochezia  or hemorrhoids. Genitourinary: Denies dysuria, frequency, urgency, nocturia, hesitancy, discharge, hematuria or flank pain. Musculoskeletal: Denies arthralgias, myalgias, stiffness, jt. swelling, pain, limping or strain/sprain.  Skin: Denies pruritus, rash, hives, warts, acne, eczema or change in skin lesion(s). Neuro: No weakness, tremor, incoordination, spasms, paresthesia or pain. Psychiatric: Denies confusion, memory loss or sensory loss. Endo: Denies change in weight, skin or hair change.  Heme/Lymph: No excessive bleeding, bruising or enlarged lymph nodes.  Physical Exam  BP 124/72   Pulse 64   Temp 97.8 F (36.6 C)   Resp 16   Ht 5\' 4"  (1.626 m)   Wt 144 lb 12.8 oz (65.7 kg)   BMI 24.85 kg/m   Appears well nourished and in no distress.  Eyes: PERRLA, EOMs, conjunctiva no swelling or erythema. Sinuses: No  frontal/maxillary tenderness ENT/Mouth: EAC's clear, TM's nl w/o erythema, bulging. Nares clear w/o erythema, swelling, exudates. Oropharynx clear without erythema or exudates. Oral hygiene is good. Tongue normal, non obstructing. Hearing intact.  Neck: Supple. Thyroid nl. Car 2+/2+ without bruits, nodes or JVD. Chest: Respirations nl with BS clear & equal w/o rales, rhonchi, wheezing or stridor.  Cor: Heart sounds normal w/ regular rate and rhythm without sig. murmurs, gallops, clicks, or rubs. Peripheral pulses normal and equal  without edema.  Abdomen: Soft & bowel sounds normal. Non-tender w/o guarding, rebound, hernias, masses, or organomegaly.  Lymphatics: Unremarkable.  Musculoskeletal: Full ROM all peripheral extremities, joint stability, 5/5 strength, and normal gait.  Skin: Warm, dry without exposed rashes, lesions or ecchymosis apparent.  Neuro: Cranial nerves intact, reflexes equal bilaterally. Sensory-motor testing grossly intact. Tendon reflexes grossly intact.  Pysch: Alert & oriented x 3.  Insight and judgement nl & appropriate. No ideations.  Assessment and Plan:  1. Essential hypertension  - Continue medication, monitor blood pressure at home.  - Continue DASH diet. Reminder to go to the ER if any CP,  SOB, nausea, dizziness, severe HA, changes vision/speech,  left arm numbness and tingling and jaw pain.  - CBC with Differential/Platelet - BASIC METABOLIC PANEL WITH GFR - TSH  2. Mixed hyperlipidemia  - Continue diet/meds, exercise,& lifestyle modifications.  - Continue monitor periodic cholesterol/liver & renal functions   - Hepatic function panel - Lipid panel - TSH  3. Prediabetes  - Continue diet, exercise, lifestyle modifications.  - Monitor appropriate labs. - Hemoglobin A1c - Insulin, random  4. Vitamin D deficiency  - Continue supplementation. - VITAMIN D 25 Hydroxy   5. Other abnormal glucose   6. Gastroesophageal reflux disease    7.  Medication management  - CBC with Differential/Platelet - BASIC METABOLIC PANEL WITH GFR - Hepatic function panel - Magnesium      Recommended regular exercise, BP monitoring, weight control, and discussed med and SE's. Recommended labs to assess and monitor clinical status. Further disposition pending results of labs. Over 30 minutes of exam, counseling, chart review was performed

## 2016-08-24 NOTE — Patient Instructions (Signed)

## 2016-08-25 LAB — HEPATIC FUNCTION PANEL
ALK PHOS: 59 U/L (ref 33–130)
ALT: 11 U/L (ref 6–29)
AST: 14 U/L (ref 10–35)
Albumin: 4 g/dL (ref 3.6–5.1)
BILIRUBIN DIRECT: 0.1 mg/dL (ref <=0.2)
BILIRUBIN INDIRECT: 0.4 mg/dL (ref 0.2–1.2)
TOTAL PROTEIN: 6.4 g/dL (ref 6.1–8.1)
Total Bilirubin: 0.5 mg/dL (ref 0.2–1.2)

## 2016-08-25 LAB — BASIC METABOLIC PANEL WITH GFR
BUN: 10 mg/dL (ref 7–25)
CHLORIDE: 104 mmol/L (ref 98–110)
CO2: 20 mmol/L (ref 20–31)
Calcium: 8.9 mg/dL (ref 8.6–10.4)
Creat: 0.76 mg/dL (ref 0.50–0.99)
GFR, EST NON AFRICAN AMERICAN: 80 mL/min (ref >=60)
GFR, Est African American: >89 mL/min (ref >=60)
GLUCOSE: 77 mg/dL (ref 65–99)
Potassium: 4.3 mmol/L (ref 3.5–5.3)
SODIUM: 143 mmol/L (ref 135–146)

## 2016-08-25 LAB — HEMOGLOBIN A1C
Hgb A1c MFr Bld: 5.4 % (ref <5.7)
MEAN PLASMA GLUCOSE: 108 mg/dL

## 2016-08-25 LAB — VITAMIN D 25 HYDROXY (VIT D DEFICIENCY, FRACTURES): VIT D 25 HYDROXY: 65 ng/mL (ref 30–100)

## 2016-08-25 LAB — TSH: TSH: 1.82 mIU/L

## 2016-08-25 LAB — MAGNESIUM: Magnesium: 1.9 mg/dL (ref 1.5–2.5)

## 2016-08-25 LAB — LIPID PANEL
CHOLESTEROL: 200 mg/dL — AB (ref <200)
HDL: 92 mg/dL (ref >50)
LDL Cholesterol: 91 mg/dL (ref <100)
TRIGLYCERIDES: 84 mg/dL (ref <150)
Total CHOL/HDL Ratio: 2.2 Ratio (ref <5.0)
VLDL: 17 mg/dL (ref <30)

## 2016-08-25 LAB — INSULIN, RANDOM: Insulin: 2 u[IU]/mL (ref 2.0–19.6)

## 2016-10-20 DIAGNOSIS — H5203 Hypermetropia, bilateral: Secondary | ICD-10-CM | POA: Diagnosis not present

## 2016-10-20 DIAGNOSIS — H04123 Dry eye syndrome of bilateral lacrimal glands: Secondary | ICD-10-CM | POA: Diagnosis not present

## 2016-10-20 DIAGNOSIS — H4321 Crystalline deposits in vitreous body, right eye: Secondary | ICD-10-CM | POA: Diagnosis not present

## 2016-10-20 DIAGNOSIS — H25813 Combined forms of age-related cataract, bilateral: Secondary | ICD-10-CM | POA: Diagnosis not present

## 2016-10-20 DIAGNOSIS — D3131 Benign neoplasm of right choroid: Secondary | ICD-10-CM | POA: Diagnosis not present

## 2016-11-08 ENCOUNTER — Other Ambulatory Visit: Payer: Self-pay | Admitting: Physician Assistant

## 2016-11-21 ENCOUNTER — Ambulatory Visit (INDEPENDENT_AMBULATORY_CARE_PROVIDER_SITE_OTHER): Payer: PPO | Admitting: Physician Assistant

## 2016-11-21 ENCOUNTER — Encounter: Payer: Self-pay | Admitting: Physician Assistant

## 2016-11-21 VITALS — BP 100/60 | HR 68 | Resp 16 | Ht 64.0 in | Wt 143.0 lb

## 2016-11-21 DIAGNOSIS — K209 Esophagitis, unspecified without bleeding: Secondary | ICD-10-CM

## 2016-11-21 DIAGNOSIS — Z1211 Encounter for screening for malignant neoplasm of colon: Secondary | ICD-10-CM

## 2016-11-21 DIAGNOSIS — R079 Chest pain, unspecified: Secondary | ICD-10-CM | POA: Diagnosis not present

## 2016-11-21 DIAGNOSIS — K219 Gastro-esophageal reflux disease without esophagitis: Secondary | ICD-10-CM

## 2016-11-21 MED ORDER — SUCRALFATE 1 GM/10ML PO SUSP: ORAL | 1 refills | Status: DC

## 2016-11-21 MED ORDER — NA SULFATE-K SULFATE-MG SULF 17.5-3.13-1.6 GM/177ML PO SOLN: 1.0000 | Freq: Once | ORAL | 0 refills | Status: AC

## 2016-11-21 MED ORDER — OMEPRAZOLE 40 MG PO CPDR: 40.0000 mg | DELAYED_RELEASE_CAPSULE | Freq: Two times a day (BID) | ORAL | 11 refills | Status: DC

## 2016-11-21 NOTE — Patient Instructions (Signed)
We have sent the following medications to your pharmacy for you to pick up at your convenience: omeprazole 40 mg one tablet by mouth twice daily and Carafate supsension to take 1 g liquid in between meals and at bedtime.   You have been given a anti-reflux regimen.  You have been scheduled for a colonoscopy. Please follow written instructions given to you at your visit today.  Please pick up your prep supplies at the pharmacy within the next 1-3 days. If you use inhalers (even only as needed), please bring them with you on the day of your procedure. Your physician has requested that you go to www.[email or web address] and enter the access code given to you at your visit today. This web site gives a general overview about your procedure. However, you should still follow specific instructions given to you by our office regarding your preparation for the procedure.

## 2016-11-21 NOTE — Progress Notes (Signed)
Subjective:    Patient ID: [name] [name], female    DOB: Jan 28, 1946, 70 y.o.   MRN: [id number]  HPI [name] [name] is a very nice 71 year old white female, former patient of Dr. Deatra Ina she was last seen here in 2016. She has history of chronic GERD and previous dilation of esophageal stricture in 2016. Other medical problems include hypertension and diverticulosis. Last colonoscopy done in February 2008 showed diffuse diverticulosis and internal hemorrhoids, no polyps. She comes in today to discuss heartburn and chest discomfort which she says has been present over the past few months. She had seen her PCP Dr. Janit Pagan in December 2017 for annual follow-up and had been on twice daily omeprazole fairly long term. He cut her omeprazole dose back to 40 mg every other day, and then she actually ran out of her medicine completely about a week ago. She comes in stating over the past several months she's been having early morning chest pain, nonexertional and sometimes has pain all day long regardless of activity or eating. She says at times this is a burning "hot poker type of feeling. She denies any dysphagia or odynophagia. At this point she is having fairly constant daily symptoms. She has not been on any acid blocker over the past week. No regular NSAIDs. She has no current complaints of changes in bowel habits melena or hematochezia. No abdominal pain.  Review of Systems Pertinent positive and negative review of systems were noted in the above HPI section.  All other review of systems was otherwise negative.  Outpatient Encounter Prescriptions as of 11/21/2016  Medication Sig  . atorvastatin (LIPITOR) 80 MG tablet Take 40 mg by mouth 3 (three) times a week. Tuesday, Thursday, and Saturday  . B Complex Vitamins (B COMPLEX PO) Take 1 tablet by mouth every other day.   . Calcium Carb-Cholecalciferol (CALCIUM-VITAMIN D) 500-200 MG-UNIT tablet Take by mouth.  . Cholecalciferol (VITAMIN D3) 5000 UNITS  TABS Take 5,000 Units by mouth daily.   Marland Kitchen LORazepam (ATIVAN) 1 MG tablet Take 1-2 tablets (1-2 mg total) by mouth at bedtime as needed. for sleep  . Magnesium 250 MG TABS Take 250 mg by mouth daily.   . metoprolol tartrate (LOPRESSOR) 25 MG tablet Take 1 tablet (25 mg total) by mouth daily.  Marland Kitchen omeprazole (PRILOSEC) 40 MG capsule Take 1 capsule (40 mg total) by mouth 2 (two) times daily.  . sertraline (ZOLOFT) 100 MG tablet TAKE 1 TABLET (100 MG TOTAL) BY MOUTH DAILY.  . [DISCONTINUED] omeprazole (PRILOSEC) 40 MG capsule TAKE ONE CAPSULE BY MOUTH TWICE A DAY  . Na Sulfate-K Sulfate-Mg Sulf 17.5-3.13-1.6 GM/180ML SOLN Take 1 kit by mouth once.  . sucralfate (CARAFATE) 1 GM/10ML suspension Take 1 gram liquid in between meals and at bedtime x 3 weeks   No facility-administered encounter medications on file as of 11/21/2016.    Allergies  Allergen Reactions  . Bactrim [Sulfamethoxazole-Trimethoprim] Swelling    Face and eyes swollen and red.  Marland Kitchen Ketek [Telithromycin]     Unknown   Patient Active Problem List   Diagnosis Date Noted  . Psoriasis 05/25/2016  . Hereditary and idiopathic peripheral neuropathy 05/25/2016  . Encounter for Medicare annual wellness exam 04/27/2015  . Major depression in full remission ([place]) 11/27/2014  . Insomnia 11/27/2014  . Cervical disc disease 10/10/2014  . Medication management 10/02/2014  . Mixed hyperlipidemia 07/01/2014  . Hypertension   . Other abnormal glucose   . Vitamin D deficiency   . Fibroid   .  Mitral valve disorder 06/16/2009  . Diverticulitis of colon 05/06/2008   Social History   Social History  . Marital status: Married    Spouse name: N/A  . Number of children: 0  . Years of education: N/A   Occupational History  . retired    Social History Main Topics  . Smoking status: Current Some Day Smoker    Packs/day: 0.00    Years: 30.00    Types: Cigarettes  . Smokeless tobacco: Never Used  . Alcohol use Yes  . Drug use: No  .  Sexual activity: Yes    Birth control/ protection: Surgical   Other Topics Concern  . Not on file   Social History Narrative   She lives with husband.  They do not have children.   She previously worked at [employer].   Highest level of education:  High school     Ms. Deliz's family history includes Cirrhosis in her father; Colon cancer in her maternal grandmother; Colon polyps in her sister; Diabetes in her father and mother; Heart disease in her father, maternal grandfather, paternal grandfather, and paternal grandmother; Hyperlipidemia in her father; Hypertension in her father and mother; Lymphoma in her maternal grandmother; Melanoma in her mother; Stomach cancer in her maternal grandmother; Uterine cancer in her sister.      Objective:    Vitals:   11/21/16 1433  BP: 100/60  Pulse: 68  Resp: 16    Physical Exam  well-developed older white female in no acute distress, pleasant blood pressure 100/60, pulse 68, BMI 24.5. HEENT; nontraumatic normocephalic EOMI PERRLA sclera anicteric, Cardiovascular; regular rate and rhythm with S1-S2 no murmur or gallop, Pulmonary; clear bilaterally, Abdomen ;nontender, nondistended bowel sounds are active there is no palpable mass or hepatosplenomegaly she has a low midline incisional scar, Rectal; exam not done, Ext; no clubbing cyanosis or edema skin warm and dry, Neuropsych ;mood and affect appropriate       Assessment & Plan:   #55 71 year old female with history of chronic GERD and previous dilation for distal esophageal stricture who comes in with several month history of fairly constant symptoms with chest pain and burning. She had been maintained fairly long term on twice a day omeprazole and this was decreased to 40 mg by mouth every other day because of concerns about potential long-term side effects. I suspect symptoms are secondary to poorly controlled GERD and probable reflux associated esophagitis. #2 colon cancer  surveillance-due for colonoscopy, last colonoscopy February 2008 negative #3 diverticulosis #4 previously noted small duodenal diverticulum #5 hypertension  Plan; Discussed options with the patient regarding trial of twice a day H2 blocker versus twice a day PPI. She's not overly concerned with potential long-term side effects and would like to resume twice a day PPI. We will start omeprazole 40 mg by mouth before meals breakfast and before meals dinner We discussed strict anti-reflux regimen. Add short course of Carafate 1 g slurry between meals and at bedtime 2-3 weeks. Will schedule for colonoscopy with Dr. Henrene Pastor. Procedure discussed in detail with patient including risks and benefits and she is agreeable to proceed.  [name] Macaluso S Allison Deshotels PA-C 11/21/2016   Cc: Unk Pinto, MD

## 2016-11-22 NOTE — Progress Notes (Signed)
Agree with assessment and plans as outlined 

## 2016-11-24 ENCOUNTER — Ambulatory Visit (INDEPENDENT_AMBULATORY_CARE_PROVIDER_SITE_OTHER): Payer: PPO | Admitting: Physician Assistant

## 2016-11-24 ENCOUNTER — Encounter: Payer: Self-pay | Admitting: Physician Assistant

## 2016-11-24 VITALS — BP 116/72 | HR 66 | Temp 97.5°F | Resp 16 | Ht 64.0 in | Wt 145.4 lb

## 2016-11-24 DIAGNOSIS — Z0000 Encounter for general adult medical examination without abnormal findings: Secondary | ICD-10-CM

## 2016-11-24 DIAGNOSIS — R7309 Other abnormal glucose: Secondary | ICD-10-CM

## 2016-11-24 DIAGNOSIS — L409 Psoriasis, unspecified: Secondary | ICD-10-CM | POA: Diagnosis not present

## 2016-11-24 DIAGNOSIS — K5732 Diverticulitis of large intestine without perforation or abscess without bleeding: Secondary | ICD-10-CM

## 2016-11-24 DIAGNOSIS — F3342 Major depressive disorder, recurrent, in full remission: Secondary | ICD-10-CM | POA: Diagnosis not present

## 2016-11-24 DIAGNOSIS — R6889 Other general symptoms and signs: Secondary | ICD-10-CM | POA: Diagnosis not present

## 2016-11-24 DIAGNOSIS — M4692 Unspecified inflammatory spondylopathy, cervical region: Secondary | ICD-10-CM

## 2016-11-24 DIAGNOSIS — M47812 Spondylosis without myelopathy or radiculopathy, cervical region: Secondary | ICD-10-CM

## 2016-11-24 DIAGNOSIS — Z23 Encounter for immunization: Secondary | ICD-10-CM | POA: Diagnosis not present

## 2016-11-24 DIAGNOSIS — E559 Vitamin D deficiency, unspecified: Secondary | ICD-10-CM

## 2016-11-24 DIAGNOSIS — Z79899 Other long term (current) drug therapy: Secondary | ICD-10-CM | POA: Diagnosis not present

## 2016-11-24 DIAGNOSIS — G4700 Insomnia, unspecified: Secondary | ICD-10-CM

## 2016-11-24 DIAGNOSIS — I059 Rheumatic mitral valve disease, unspecified: Secondary | ICD-10-CM | POA: Diagnosis not present

## 2016-11-24 DIAGNOSIS — E782 Mixed hyperlipidemia: Secondary | ICD-10-CM | POA: Diagnosis not present

## 2016-11-24 DIAGNOSIS — G609 Hereditary and idiopathic neuropathy, unspecified: Secondary | ICD-10-CM

## 2016-11-24 DIAGNOSIS — Z0001 Encounter for general adult medical examination with abnormal findings: Secondary | ICD-10-CM

## 2016-11-24 DIAGNOSIS — I10 Essential (primary) hypertension: Secondary | ICD-10-CM | POA: Diagnosis not present

## 2016-11-24 LAB — BASIC METABOLIC PANEL WITH GFR
BUN: 13 mg/dL (ref 7–25)
CHLORIDE: 107 mmol/L (ref 98–110)
CO2: 23 mmol/L (ref 20–31)
Calcium: 9.3 mg/dL (ref 8.6–10.4)
Creat: 0.83 mg/dL (ref 0.50–0.99)
GFR, EST AFRICAN AMERICAN: 83 mL/min (ref >=60)
GFR, Est Non African American: 72 mL/min (ref >=60)
Glucose, Bld: 87 mg/dL (ref 65–99)
POTASSIUM: 4.5 mmol/L (ref 3.5–5.3)
SODIUM: 139 mmol/L (ref 135–146)

## 2016-11-24 LAB — CBC WITH DIFFERENTIAL/PLATELET
BASOS PCT: 0 %
Basophils Absolute: 0 cells/uL (ref 0–200)
EOS ABS: 201 {cells}/uL (ref 15–500)
EOS PCT: 3 %
HCT: 35.2 % (ref 35.0–45.0)
Hemoglobin: 11.9 g/dL (ref 11.7–15.5)
Lymphocytes Relative: 34 %
Lymphs Abs: 2278 cells/uL (ref 850–3900)
MCH: 31 pg (ref 27.0–33.0)
MCHC: 33.8 g/dL (ref 32.0–36.0)
MCV: 91.7 fL (ref 80.0–100.0)
MPV: 8.7 fL (ref 7.5–12.5)
Monocytes Absolute: 335 cells/uL (ref 200–950)
Monocytes Relative: 5 %
NEUTROS ABS: 3886 {cells}/uL (ref 1500–7800)
Neutrophils Relative %: 58 %
Platelets: 232 10*3/uL (ref 140–400)
RBC: 3.84 MIL/uL (ref 3.80–5.10)
RDW: 14.3 % (ref 11.0–15.0)
WBC: 6.7 10*3/uL (ref 3.8–10.8)

## 2016-11-24 LAB — LIPID PANEL
CHOL/HDL RATIO: 2.2 ratio (ref <5.0)
Cholesterol: 191 mg/dL (ref <200)
HDL: 85 mg/dL (ref >50)
LDL CALC: 94 mg/dL (ref <100)
TRIGLYCERIDES: 61 mg/dL (ref <150)
VLDL: 12 mg/dL (ref <30)

## 2016-11-24 LAB — HEPATIC FUNCTION PANEL
ALK PHOS: 57 U/L (ref 33–130)
ALT: 11 U/L (ref 6–29)
AST: 13 U/L (ref 10–35)
Albumin: 4.1 g/dL (ref 3.6–5.1)
BILIRUBIN DIRECT: 0.1 mg/dL (ref <=0.2)
Indirect Bilirubin: 0.4 mg/dL (ref 0.2–1.2)
Total Bilirubin: 0.5 mg/dL (ref 0.2–1.2)
Total Protein: 6.5 g/dL (ref 6.1–8.1)

## 2016-11-24 LAB — TSH: TSH: 1.25 m[IU]/L

## 2016-11-24 MED ORDER — SERTRALINE HCL 100 MG PO TABS: ORAL_TABLET | ORAL | 1 refills | Status: DC

## 2016-11-24 NOTE — Patient Instructions (Addendum)
Living With Depression Everyone experiences occasional disappointment, sadness, and loss in their lives. When you are feeling down, blue, or sad for at least 2 weeks in a row, it may mean that you have depression. Depression can affect your thoughts and feelings, relationships, daily activities, and physical health. It is caused by changes in the way your brain functions. If you receive a diagnosis of depression, your health care provider will tell you which type of depression you have and what treatment options are available to you. If you are living with depression, there are ways to help you recover from it and also ways to prevent it from coming back. How to cope with lifestyle changes Coping with stress  Stress is your body's reaction to life changes and events, both good and bad. Stressful situations may include:  Getting married.  The death of a spouse.  Losing a job.  Retiring.  Having a baby. Stress can last just a few hours or it can be ongoing. Stress can play a major role in depression, so it is important to learn both how to cope with stress and how to think about it differently. Talk with your health care provider or a counselor if you would like to learn more about stress reduction. He or she may suggest some stress reduction techniques, such as:  Music therapy. This can include creating music or listening to music. Choose music that you enjoy and that inspires you.  Mindfulness-based meditation. This kind of meditation can be done while sitting or walking. It involves being aware of your normal breaths, rather than trying to control your breathing.  Centering prayer. This is a kind of meditation that involves focusing on a spiritual word or phrase. Choose a word, phrase, or sacred image that is meaningful to you and that brings you peace.  Deep breathing. To do this, expand your stomach and inhale slowly through your nose. Hold your breath for 3-5 seconds, then exhale slowly,  allowing your stomach muscles to relax.  Muscle relaxation. This involves intentionally tensing muscles then relaxing them. Choose a stress reduction technique that fits your lifestyle and personality. Stress reduction techniques take time and practice to develop. Set aside 5-15 minutes a day to do them. Therapists can offer training in these techniques. The training may be covered by some insurance plans. Other things you can do to manage stress include:  Keeping a stress diary. This can help you learn what triggers your stress and ways to control your response.  Understanding what your limits are and saying no to requests or events that lead to a schedule that is too full.  Thinking about how you respond to certain situations. You may not be able to control everything, but you can control how you react.  Adding humor to your life by watching funny films or TV shows.  Making time for activities that help you relax and not feeling guilty about spending your time this way. Medicines  Your health care provider may suggest certain medicines if he or she feels that they will help improve your condition. Avoid using alcohol and other substances that may prevent your medicines from working properly (may interact). It is also important to:  Talk with your pharmacist or health care provider about all the medicines that you take, their possible side effects, and what medicines are safe to take together.  Make it your goal to take part in all treatment decisions (shared decision-making). This includes giving input on the side effects   of medicines. It is best if shared decision-making with your health care provider is part of your total treatment plan. If your health care provider prescribes a medicine, you may not notice the full benefits of it for 4-8 weeks. Most people who are treated for depression need to be on medicine for at least 6-12 months after they feel better. If you are taking medicines as  part of your treatment, do not stop taking medicines without first talking to your health care provider. You may need to have the medicine slowly decreased (tapered) over time to decrease the risk of harmful side effects. Relationships  Your health care provider may suggest family therapy along with individual therapy and drug therapy. While there may not be family problems that are causing you to feel depressed, it is still important to make sure your family learns as much as they can about your mental health. Having your family's support can help make your treatment successful. How to recognize changes in your condition Everyone has a different response to treatment for depression. Recovery from major depression happens when you have not had signs of major depression for two months. This may mean that you will start to:  Have more interest in doing activities.  Feel less hopeless than you did 2 months ago.  Have more energy.  Overeat less often, or have better or improving appetite.  Have better concentration. Your health care provider will work with you to decide the next steps in your recovery. It is also important to recognize when your condition is getting worse. Watch for these signs:  Having fatigue or low energy.  Eating too much or too little.  Sleeping too much or too little.  Feeling restless, agitated, or hopeless.  Having trouble concentrating or making decisions.  Having unexplained physical complaints.  Feeling irritable, angry, or aggressive. Get help as soon as you or your family members notice these symptoms coming back. How to get support and help from others How to talk with friends and family members about your condition  Talking to friends and family members about your condition can provide you with one way to get support and guidance. Reach out to trusted friends or family members, explain your symptoms to them, and let them know that you are working with a  health care provider to treat your depression. Financial resources  Not all insurance plans cover mental health care, so it is important to check with your insurance carrier. If paying for co-pays or counseling services is a problem, search for a local or county mental health care center. They may be able to offer public mental health care services at low or no cost when you are not able to see a private health care provider. If you are taking medicine for depression, you may be able to get the generic form, which may be less expensive. Some makers of prescription medicines also offer help to patients who cannot afford the medicines they need. Follow these instructions at home:  Get the right amount and quality of sleep.  Cut down on using caffeine, tobacco, alcohol, and other potentially harmful substances.  Try to exercise, such as walking or lifting small weights.  Take over-the-counter and prescription medicines only as told by your health care provider.  Eat a healthy diet that includes plenty of vegetables, fruits, whole grains, low-fat dairy products, and lean protein. Do not eat a lot of foods that are high in solid fats, added sugars, or salt.  Keep   all follow-up visits as told by your health care provider. This is important. Contact a health care provider if:  You stop taking your antidepressant medicines, and you have any of these symptoms:  Nausea.  Headache.  Feeling lightheaded.  Chills and body aches.  Not being able to sleep (insomnia).  You or your friends and family think your depression is getting worse. Get help right away if:  You have thoughts of hurting yourself or others. If you ever feel like you may hurt yourself or others, or have thoughts about taking your own life, get help right away. You can go to your nearest emergency department or call:  Your local emergency services (911 in the [place]  A suicide crisis helpline, such as the National Suicide  Prevention Lifeline at 1-800-273-8255. This is open 24-hours a day. Summary  If you are living with depression, there are ways to help you recover from it and also ways to prevent it from coming back.  Work with your health care team to create a management plan that includes counseling, stress management techniques, and healthy lifestyle habits. This information is not intended to replace advice given to you by your health care provider. Make sure you discuss any questions you have with your health care provider. Document Released: 07/18/2016 Document Revised: 07/18/2016 Document Reviewed: 07/18/2016 [employer] Interactive Patient Education  2017 [employer]  

## 2016-11-24 NOTE — Progress Notes (Signed)
Medicare Wellness Visit and 3 month OV Assessment:   Essential hypertension - continue medications, DASH diet, exercise and monitor at home. Call if greater than 130/80.  - CBC with Differential/Platelet - BASIC METABOLIC PANEL WITH GFR - Hepatic function panel  Prediabetes Discussed general issues about diabetes pathophysiology and management., Educational material distributed., Suggested low cholesterol diet., Encouraged aerobic exercise., Discussed foot care., Reminded to get yearly retinal exam.   Mixed hyperlipidemia -continue medications, check lipids, decrease fatty foods, increase activity.    Vitamin D deficiency Continue supplement  Cervical arthritis RICE, NSAIDS, exercises given, if not better get xray and PT referral or ortho referral.    Diverticulitis of colon  follow up GI.   Mitral valve disorder No SOB, CP, edema, monitor  Medication management  Estrogen deficiency Dexa in 5 years  Depression  stress management techniques discussed, increase water, good sleep hygiene discussed, increase exercise, and increase veggies.  - will continue zoloft for now, when things calm down, may try to taper off  Insomnia Insomnia- good sleep hygiene discussed, increase day time activity,continue ativan  Future Appointments Date Time Provider [place]  12/15/2016 2:00 PM Josue Hector, MD [facility]  01/24/2017 3:00 PM Irene Shipper, MD [facility]  03/08/2017 2:30 PM Unk Pinto, MD [facility] None  06/13/2017 2:00 PM Vicie Mutters, PA-C [facility] None    Plan:   During the course of the visit the patient was educated and counseled about appropriate screening and preventive services including:    Influenza vaccine  Td vaccine  Screening electrocardiogram  Screening mammography  Bone densitometry screening  Colorectal cancer screening  Diabetes screening  Glaucoma screening  Nutrition counseling    Subjective:    [name] [name] [place] is a 71 y.o. female who presents for Medicare Annual Wellness Visit and 3 month follow up for HTN, chol, preDM, and depression.   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  She denies chest pain, shortness of breath, dizziness.  She is on cholesterol medication and denies myalgias. Her cholesterol is at goal. The cholesterol last visit was:   Lab Results  Component Value Date   CHOL 200 (H) 08/24/2016   HDL 92 08/24/2016   LDLCALC 91 08/24/2016   TRIG 84 08/24/2016   CHOLHDL 2.2 08/24/2016   She has been working on diet and exercise for prediabetes, and denies paresthesia of the feet, polydipsia, polyuria and visual disturbances. Last A1C in the office was:  Lab Results  Component Value Date   HGBA1C 5.4 08/24/2016  She is on Ativan for sleep and zoloft for depression but states that it is not helping.   Patient is on Vitamin D supplement. Lab Results  Component Value Date   VD25OH 65 08/24/2016   Has been following with Dr. Deatra Ina and Edward Jolly PA-C for diarrhea/AB pain. Getting colonoscopy.  BMI is Body mass index is 24.96 kg/m., she is working on diet and exercise. Wt Readings from Last 3 Encounters:  11/24/16 145 lb 6.4 oz (66 kg)  11/21/16 143 lb (64.9 kg)  08/24/16 144 lb 12.8 oz (65.7 kg)     Medication Review Current Outpatient Prescriptions on File Prior to Visit  Medication Sig Dispense Refill  . atorvastatin (LIPITOR) 80 MG tablet Take 40 mg by mouth 3 (three) times a week. Tuesday, Thursday, and Saturday    . B Complex  Vitamins (B COMPLEX PO) Take 1 tablet by mouth every other day.     . Cholecalciferol (VITAMIN D3) 5000 UNITS TABS Take 5,000 Units by mouth daily.     Marland Kitchen LORazepam (ATIVAN) 1 MG tablet Take 1-2 tablets (1-2 mg total) by mouth at bedtime as needed. for sleep 60 tablet 5  . Magnesium 250 MG TABS Take 250  mg by mouth daily.     . metoprolol tartrate (LOPRESSOR) 25 MG tablet Take 1 tablet (25 mg total) by mouth daily. 30 tablet 11  . omeprazole (PRILOSEC) 40 MG capsule Take 1 capsule (40 mg total) by mouth 2 (two) times daily. 60 capsule 11  . sertraline (ZOLOFT) 100 MG tablet TAKE 1 TABLET (100 MG TOTAL) BY MOUTH DAILY. 90 tablet 1  . sucralfate (CARAFATE) 1 GM/10ML suspension Take 1 gram liquid in between meals and at bedtime x 3 weeks 420 mL 1   No current facility-administered medications on file prior to visit.     Current Problems (verified) Patient Active Problem List   Diagnosis Date Noted  . Psoriasis 05/25/2016  . Hereditary and idiopathic peripheral neuropathy 05/25/2016  . Encounter for Medicare annual wellness exam 04/27/2015  . Major depression in full remission ([place]) 11/27/2014  . Insomnia 11/27/2014  . Cervical arthritis ([place]) 10/10/2014  . Medication management 10/02/2014  . Mixed hyperlipidemia 07/01/2014  . Hypertension   . Other abnormal glucose   . Vitamin D deficiency   . Mitral valve disorder 06/16/2009  . Diverticulitis of colon 05/06/2008    Screening Tests Immunization History  Administered Date(s) Administered  . DT 04/27/2015  . Pneumococcal Conjugate-13 04/27/2015  . Pneumococcal Polysaccharide-23 12/22/2004  . Rabies, IM 05/04/2016, 05/07/2016  . Td 12/16/2003   Preventative care: Last colonoscopy: 2008, normal, due 2018, has OV EGD 02/2015 Last mammogram: 08/2015, CAT B Last pap smear/pelvic exam: 11/2012 , 1 abnormal in her 20's, declines another DEXA: at OB/GYN-08/2015 normal Echo 2009 normal EF Ct AB 11/2014  Prior vaccinations: TD or Tdap: 2016 Influenza: declines Pneumococcal: 2018 Prevnar 13: 2016 Shingles/Zostavax: declines  Names of Other Physician/Practitioners you currently use: 1. [facility]- here for primary care 2. Dr. Sabra Heck, eye doctor, last visit Jan 2017 3. Dr. Yaakov Guthrie, dentist,  visit q 4 months Patient Care Team: Unk Pinto, MD as PCP - General (Internal Medicine) Justice Britain, MD as Consulting Physician (Orthopedic Surgery) Iran Planas, MD as Consulting Physician (Orthopedic Surgery) Thornell Sartorius, MD as Consulting Physician (Otolaryngology) Barbaraann Cao, OD as Referring Physician (Optometry) Josue Hector, MD as Consulting Physician (Cardiology) Rana Snare, MD as Consulting Physician (Urology) Anastasio Auerbach, MD as Consulting Physician (Gynecology) Devra Dopp, MD as Referring Physician (Dermatology)  Allergies Allergies  Allergen Reactions  . Bactrim [Sulfamethoxazole-Trimethoprim] Swelling    Face and eyes swollen and red.  Marland Kitchen Ketek [Telithromycin]     Unknown    SURGICAL HISTORY She  has a past surgical history that includes Tympanoplasty (Right); Rotator cuff repair (Left); Ectopic pregnancy surgery (1971, 1979); Hemorrhoid surgery; Oophorectomy; Gynecologic cryosurgery (1976); Abdominal hysterectomy (2000); Rotator cuff repair (Right, 2/15); Colonoscopy; Esophagogastroduodenoscopy; Upper gastrointestinal endoscopy; Ganglion cyst excision (Right); and Finger surgery. FAMILY HISTORY Her family history includes Cirrhosis in her father; Colon cancer in her maternal grandmother; Colon polyps in her sister; Diabetes in her father and mother; Heart disease in her father, maternal grandfather, paternal grandfather, and paternal grandmother; Hyperlipidemia in her father; Hypertension in her father and mother; Lymphoma in her maternal  grandmother; Melanoma in her mother; Stomach cancer in her maternal grandmother; Uterine cancer in her sister. SOCIAL HISTORY She  reports that she has been smoking Cigarettes.  She has been smoking about 0.00 packs per day for the past 30.00 years. She has never used smokeless tobacco. She reports that she drinks alcohol. She reports that she does not use drugs.  MEDICARE WELLNESS  OBJECTIVES: Physical activity:   Cardiac risk factors:   Depression/mood screen:   Depression screen [facility] 2/9 08/24/2016  Decreased Interest 0  Down, Depressed, Hopeless 0  PHQ - 2 Score 0  Altered sleeping -  Tired, decreased energy -  Change in appetite -  Feeling bad or failure about yourself  -  Trouble concentrating -  Moving slowly or fidgety/restless -  Suicidal thoughts -  PHQ-9 Score -  Difficult doing work/chores -    ADLs:  In your present state of health, do you have any difficulty performing the following activities: 08/24/2016  Hearing? N  Vision? N  Difficulty concentrating or making decisions? N  Walking or climbing stairs? N  Dressing or bathing? N  Doing errands, shopping? N  Some recent data might be hidden    Cognitive Testing  Alert? Yes  Normal Appearance?Yes  Oriented to person? Yes  Place? Yes   Time? Yes  Recall of three objects?  Yes  Can perform simple calculations? Yes  Displays appropriate judgment?Yes  Can read the correct time from a watch face?Yes  EOL planning: Does Patient Have a Medical Advance Directive?: Yes Type of Advance Directive: Healthcare Power of Attorney, Living will [place] in Chart?: No - copy requested   Objective:   Blood pressure 116/72, pulse 66, temperature 97.5 F (36.4 C), resp. rate 16, height 5\' 4"  (1.626 m), weight 145 lb 6.4 oz (66 kg), SpO2 97 %. Body mass index is 24.96 kg/m.  General appearance: alert, no distress, WD/WN,  female HEENT: normocephalic, sclerae anicteric, TMs pearly, nares patent, no discharge or erythema, pharynx normal, right inner nostril at septum with small tender nodule, non fluctuant  Oral cavity: MMM, no lesions Neck: supple, no lymphadenopathy, no thyromegaly, no masses Heart: RRR, bradycardia, slightly holosystolic murmur, normal S1, S2 Lungs: CTA bilaterally, no wheezes, rhonchi, or rales Abdomen: +bs, soft, non tender, no hepatomegaly, no  splenomegaly Musculoskeletal: nontender, no swelling, no obvious deformity Extremities: no edema, no cyanosis, no clubbing Pulses: 2+ symmetric, upper and lower extremities, normal cap refill Neurological: alert, oriented x 3, CN2-12 intact, strength normal upper extremities and lower extremities, sensation normal throughout, DTRs 2+ throughout, no cerebellar signs, gait normal Psychiatric: normal affect, behavior normal, pleasant  Breast: defer ITG:PQDIY Rectal: defer  Medicare Attestation I have personally reviewed: The patient's medical and social history Their use of alcohol, tobacco or illicit drugs Their current medications and supplements The patient's functional ability including ADLs,fall risks, home safety risks, cognitive, and hearing and visual impairment Diet and physical activities Evidence for depression or mood disorders  The patient's weight, height, BMI, and visual acuity have been recorded in the chart.  I have made referrals, counseling, and provided education to the patient based on review of the above and I have provided the patient with a written personalized care plan for preventive services.     Vicie Mutters, PA-C   11/24/2016

## 2016-11-28 ENCOUNTER — Ambulatory Visit: Payer: PPO | Admitting: Cardiovascular Disease

## 2016-11-29 ENCOUNTER — Encounter: Payer: Self-pay | Admitting: Cardiovascular Disease

## 2016-12-12 NOTE — Progress Notes (Signed)
Patient ID: [name] [name], female   DOB: 12/12/1945, 71 y.o.   MRN: [id number]   Laverle is seen today for F/U of palpitatiions, PAC's and  Smoking She denies SSCP, dyspnea, palpitations or syncope. She has no known structural heart disease.  No new issues seeing Dr Ernestine Conrad now   Had right shoulder surgery for torn tendon  Seeing Dr Onnie Graham  Injured right wrist with neuropathy during PT in March Now complaining of bilateral arm  weakness Has seen neuro and diagnosed with bilateral CTS and has left mulilevel cervical foraminal narrowing  ? Epidural planned with Dr Nelva Bush     Lab Results  Component Value Date   CHOL 191 11/24/2016   HDL 85 11/24/2016   LDLCALC 94 11/24/2016   TRIG 61 11/24/2016   CHOLHDL 2.2 11/24/2016   Lab Results  Component Value Date   ALT 11 11/24/2016   AST 13 11/24/2016   ALKPHOS 57 11/24/2016   BILITOT 0.5 11/24/2016    ROS: Denies fever, malais, weight loss, blurry vision, decreased visual acuity, cough, sputum, SOB, hemoptysis, pleuritic pain, palpitaitons, heartburn, abdominal pain, melena, lower extremity edema, claudication, or rash.  All other systems reviewed and negative  General: Affect appropriate Healthy:  appears stated age 72: normal Neck supple with no adenopathy JVP normal no bruits no thyromegaly Lungs clear with no wheezing and good diaphragmatic motion Heart:  S1/S2 no murmur, no rub, gallop or click PMI normal Abdomen: benighn, BS positve, no tenderness, no AAA no bruit.  No HSM or HJR Distal pulses intact with no bruits No edema Neuro non-focal Skin warm and dry Weakness with poor hand grip bilaterally and ? Thenar atrophy   Current Outpatient Prescriptions  Medication Sig Dispense Refill  . atorvastatin (LIPITOR) 80 MG tablet Take 40 mg by mouth 3 (three) times a week. Tuesday, Thursday, and Saturday    . B Complex Vitamins (B COMPLEX PO) Take 1 tablet by mouth every other day.     . Cholecalciferol (VITAMIN D3) 5000  UNITS TABS Take 5,000 Units by mouth daily.     Marland Kitchen LORazepam (ATIVAN) 1 MG tablet 1-2 AT BEDTIME AS NEEDED FOR SLEEP 60 tablet 3  . Magnesium 250 MG TABS Take 250 mg by mouth daily.     . metoprolol tartrate (LOPRESSOR) 25 MG tablet Take 1 tablet (25 mg total) by mouth daily. 30 tablet 11  . omeprazole (PRILOSEC) 40 MG capsule Take 1 capsule (40 mg total) by mouth 2 (two) times daily. 60 capsule 11  . sertraline (ZOLOFT) 100 MG tablet TAKE 1 TABLET (100 MG TOTAL) BY MOUTH DAILY. 90 tablet 1   No current facility-administered medications for this visit.     Allergies  Bactrim [sulfamethoxazole-trimethoprim] and Ketek [telithromycin]  Electrocardiogram:  SR rate 55 normal  02/18/14  Assessment and Plan Palpitations/PAC;s: benign continue beta blocker Elevated Lipids: takes 40 mg lipitor 3x/week Cholesterol is at goal.  Continue current dose of statin and diet Rx.  No myalgias or side effects.  F/U  LFT's in 6 months. Lab Results  Component Value Date   LDLCALC 94 11/24/2016          Cervical Spine: f/u neuro stable GERD: related to smoking and wine continue prilosec Depression: improved on Zoloft Smoking:  Still has occasional cigarettes when she drinks wine   CXR 11/24/15 NAD f/u CXR ordered   Jenkins Rouge

## 2016-12-13 ENCOUNTER — Other Ambulatory Visit: Payer: Self-pay | Admitting: Internal Medicine

## 2016-12-13 DIAGNOSIS — G4700 Insomnia, unspecified: Secondary | ICD-10-CM

## 2016-12-13 MED ORDER — LORAZEPAM 1 MG PO TABS: ORAL_TABLET | ORAL | 3 refills | Status: DC

## 2016-12-13 NOTE — Telephone Encounter (Signed)
Please call Lorazepam 

## 2016-12-13 NOTE — Telephone Encounter (Signed)
Ativan was called into pharmacy on 17 th April 2018 @ 3:58 pm By DD.

## 2016-12-15 ENCOUNTER — Ambulatory Visit (INDEPENDENT_AMBULATORY_CARE_PROVIDER_SITE_OTHER): Payer: PPO | Admitting: Cardiovascular Disease

## 2016-12-15 ENCOUNTER — Encounter: Payer: Self-pay | Admitting: Cardiovascular Disease

## 2016-12-15 ENCOUNTER — Ambulatory Visit (INDEPENDENT_AMBULATORY_CARE_PROVIDER_SITE_OTHER)
Admission: RE | Admit: 2016-12-15 | Discharge: 2016-12-15 | Disposition: A | Discharge status: Home / Self Care | Payer: PPO | Source: Ambulatory Visit | Attending: Cardiovascular Disease | Admitting: Cardiovascular Disease

## 2016-12-15 VITALS — BP 122/66 | HR 71 | Ht 64.0 in | Wt 144.8 lb

## 2016-12-15 DIAGNOSIS — J449 Chronic obstructive pulmonary disease, unspecified: Secondary | ICD-10-CM

## 2016-12-15 DIAGNOSIS — F17200 Nicotine dependence, unspecified, uncomplicated: Secondary | ICD-10-CM

## 2016-12-15 DIAGNOSIS — J439 Emphysema, unspecified: Secondary | ICD-10-CM | POA: Diagnosis not present

## 2016-12-15 DIAGNOSIS — I10 Essential (primary) hypertension: Secondary | ICD-10-CM

## 2016-12-15 DIAGNOSIS — J8410 Pulmonary fibrosis, unspecified: Secondary | ICD-10-CM | POA: Diagnosis not present

## 2016-12-15 DIAGNOSIS — R918 Other nonspecific abnormal finding of lung field: Secondary | ICD-10-CM | POA: Diagnosis not present

## 2016-12-15 NOTE — Patient Instructions (Addendum)
Medication Instructions:  Your physician recommends that you continue on your current medications as directed. Please refer to the Current Medication list given to you today.  Labwork: NONE  Testing/Procedures: A chest x-ray takes a picture of the organs and structures inside the chest, including the heart, lungs, and blood vessels. This test can show several things, including, whether the heart is enlarges; whether fluid is building up in the lungs; and whether pacemaker / defibrillator leads are still in place.   Follow-Up: Your physician wants you to follow-up in: 6 months with Dr. Nishan. You will receive a reminder letter in the mail two months in advance. If you don't receive a letter, please call our office to schedule the follow-up appointment.   If you need a refill on your cardiac medications before your next appointment, please call your pharmacy.    

## 2016-12-19 ENCOUNTER — Encounter: Payer: Self-pay | Admitting: Internal Medicine

## 2016-12-19 ENCOUNTER — Other Ambulatory Visit: Payer: Self-pay | Admitting: Internal Medicine

## 2016-12-19 ENCOUNTER — Telehealth: Payer: Self-pay

## 2016-12-19 DIAGNOSIS — R911 Solitary pulmonary nodule: Secondary | ICD-10-CM

## 2016-12-19 DIAGNOSIS — Z122 Encounter for screening for malignant neoplasm of respiratory organs: Secondary | ICD-10-CM

## 2016-12-19 DIAGNOSIS — F17219 Nicotine dependence, cigarettes, with unspecified nicotine-induced disorders: Secondary | ICD-10-CM

## 2016-12-19 NOTE — Telephone Encounter (Signed)
-----   Message from Josue Hector, MD sent at 12/16/2016  8:02 AM EDT ----- COPD ? Lung nodule f/u non contrast chest CT

## 2016-12-19 NOTE — Telephone Encounter (Signed)
Called patient about her chest xray results. Per Dr. Johnsie Cancel, COPD ? Lung nodule f/u non contrast chest CT. Patient verbalized understanding. Will put in order for chest CT and having scheduling call patient to schedule.

## 2016-12-22 ENCOUNTER — Ambulatory Visit (INDEPENDENT_AMBULATORY_CARE_PROVIDER_SITE_OTHER)
Admission: RE | Admit: 2016-12-22 | Discharge: 2016-12-22 | Disposition: A | Discharge status: Home / Self Care | Payer: PPO | Source: Ambulatory Visit | Attending: Cardiovascular Disease | Admitting: Cardiovascular Disease

## 2016-12-22 DIAGNOSIS — J449 Chronic obstructive pulmonary disease, unspecified: Secondary | ICD-10-CM | POA: Diagnosis not present

## 2016-12-22 DIAGNOSIS — R911 Solitary pulmonary nodule: Secondary | ICD-10-CM

## 2016-12-26 ENCOUNTER — Other Ambulatory Visit: Payer: PPO

## 2017-01-10 ENCOUNTER — Encounter: Payer: Self-pay | Admitting: Internal Medicine

## 2017-01-24 ENCOUNTER — Ambulatory Visit (AMBULATORY_SURGERY_CENTER): Payer: PPO | Admitting: Internal Medicine

## 2017-01-24 ENCOUNTER — Encounter: Payer: Self-pay | Admitting: Internal Medicine

## 2017-01-24 VITALS — BP 140/79 | HR 72 | Temp 98.6°F | Resp 11 | Ht 64.0 in | Wt 143.0 lb

## 2017-01-24 DIAGNOSIS — J449 Chronic obstructive pulmonary disease, unspecified: Secondary | ICD-10-CM | POA: Diagnosis not present

## 2017-01-24 DIAGNOSIS — Z1212 Encounter for screening for malignant neoplasm of rectum: Secondary | ICD-10-CM

## 2017-01-24 DIAGNOSIS — D123 Benign neoplasm of transverse colon: Secondary | ICD-10-CM | POA: Diagnosis not present

## 2017-01-24 DIAGNOSIS — Z1211 Encounter for screening for malignant neoplasm of colon: Secondary | ICD-10-CM | POA: Diagnosis not present

## 2017-01-24 DIAGNOSIS — I10 Essential (primary) hypertension: Secondary | ICD-10-CM | POA: Diagnosis not present

## 2017-01-24 DIAGNOSIS — K219 Gastro-esophageal reflux disease without esophagitis: Secondary | ICD-10-CM | POA: Diagnosis not present

## 2017-01-24 MED ORDER — SODIUM CHLORIDE 0.9 % IV SOLN: 500.0000 mL | INTRAVENOUS | Status: DC

## 2017-01-24 NOTE — Patient Instructions (Signed)
Discharge instructions given. Handouts on polyps and diverticulosis. Resume previous medications. YOU HAD AN ENDOSCOPIC PROCEDURE TODAY AT THE [facility]:   Refer to the procedure report that was given to you for any specific questions about what was found during the examination.  If the procedure report does not answer your questions, please call your gastroenterologist to clarify.  If you requested that your care partner not be given the details of your procedure findings, then the procedure report has been included in a sealed envelope for you to review at your convenience later.  YOU SHOULD EXPECT: Some feelings of bloating in the abdomen. Passage of more gas than usual.  Walking can help get rid of the air that was put into your GI tract during the procedure and reduce the bloating. If you had a lower endoscopy (such as a colonoscopy or flexible sigmoidoscopy) you may notice spotting of blood in your stool or on the toilet paper. If you underwent a bowel prep for your procedure, you may not have a normal bowel movement for a few days.  Please Note:  You might notice some irritation and congestion in your nose or some drainage.  This is from the oxygen used during your procedure.  There is no need for concern and it should clear up in a day or so.  SYMPTOMS TO REPORT IMMEDIATELY:   Following lower endoscopy (colonoscopy or flexible sigmoidoscopy):  Excessive amounts of blood in the stool  Significant tenderness or worsening of abdominal pains  Swelling of the abdomen that is new, acute  Fever of 100F or higher   For urgent or emergent issues, a gastroenterologist can be reached at any hour by calling [phone].   DIET:  We do recommend a small meal at first, but then you may proceed to your regular diet.  Drink plenty of fluids but you should avoid alcoholic beverages for 24 hours.  ACTIVITY:  You should plan to take it easy for the rest of today and you should NOT  DRIVE or use heavy machinery until tomorrow (because of the sedation medicines used during the test).    FOLLOW UP: Our staff will call the number listed on your records the next business day following your procedure to check on you and address any questions or concerns that you may have regarding the information given to you following your procedure. If we do not reach you, we will leave a message.  However, if you are feeling well and you are not experiencing any problems, there is no need to return our call.  We will assume that you have returned to your regular daily activities without incident.  If any biopsies were taken you will be contacted by phone or by letter within the next 1-3 weeks.  Please call us at [phone] if you have not heard about the biopsies in 3 weeks.    SIGNATURES/CONFIDENTIALITY: You and/or your care partner have signed paperwork which will be entered into your electronic medical record.  These signatures attest to the fact that that the information above on your After Visit Summary has been reviewed and is understood.  Full responsibility of the confidentiality of this discharge information lies with you and/or your care-partner. 

## 2017-01-24 NOTE — Op Note (Signed)
[facility] Patient Name: [name] [name] Procedure Date: 01/24/2017 3:25 PM MRN: [id number] Endoscopist: Docia Chuck. Henrene Pastor , MD Age: 71 Referring MD:  Date of Birth: 12-25-1945 Gender: Female Account #: [id number] Procedure:                Colonoscopy, with snare polypectomy x 1 Indications:              Screening for colorectal malignant neoplasm.                            Negative index exam 2008 (Dr. Deatra Ina) Medicines:                Monitored Anesthesia Care Procedure:                Pre-Anesthesia Assessment:                           - Prior to the procedure, a History and Physical                            was performed, and patient medications and                            allergies were reviewed. The patient's tolerance of                            previous anesthesia was also reviewed. The risks                            and benefits of the procedure and the sedation                            options and risks were discussed with the patient.                            All questions were answered, and informed consent                            was obtained. Prior Anticoagulants: The patient has                            taken no previous anticoagulant or antiplatelet                            agents. ASA Grade Assessment: II - A patient with                            mild systemic disease. After reviewing the risks                            and benefits, the patient was deemed in                            satisfactory condition to undergo the procedure.  After obtaining informed consent, the colonoscope                            was passed under direct vision. Throughout the                            procedure, the patient's blood pressure, pulse, and                            oxygen saturations were monitored continuously. The                            Colonoscope was introduced through the anus and    advanced to the the cecum, identified by                            appendiceal orifice and ileocecal valve. The                            ileocecal valve, appendiceal orifice, and rectum                            were photographed. The quality of the bowel                            preparation was excellent. The colonoscopy was                            performed without difficulty. The patient tolerated                            the procedure well. The bowel preparation used was                            SUPREP. Scope In: 3:37:55 PM Scope Out: 3:52:02 PM Scope Withdrawal Time: 0 hours 11 minutes 11 seconds  Total Procedure Duration: 0 hours 14 minutes 7 seconds  Findings:                 A 2 mm polyp was found in the transverse colon. The                            polyp was removed with a cold snare. Resection and                            retrieval were complete.                           Multiple diverticula were found in the distal                            transverse colon and left colon. Internal                            hemorrhoids are present.  The exam was otherwise without abnormality on                            direct and retroflexion views. Complications:            No immediate complications. Estimated blood loss:                            None. Estimated Blood Loss:     Estimated blood loss: none. Impression:               - One 2 mm polyp in the transverse colon, removed                            with a cold snare. Resected and retrieved.                           - Diverticulosis in the distal transverse colon and                            in the left colon.                           - The examination was otherwise normal on direct                            and retroflexion views. Recommendation:           - Repeat colonoscopy in 5-10 years for surveillance.                           - Patient has a contact number available for                             emergencies. The signs and symptoms of potential                            delayed complications were discussed with the                            patient. Return to normal activities tomorrow.                            Written discharge instructions were provided to the                            patient.                           - Resume previous diet.                           - Continue present medications.                           - Await pathology results. Docia Chuck. Henrene Pastor, MD 01/24/2017 3:55:51 PM This report has been  signed electronically.

## 2017-01-24 NOTE — Progress Notes (Signed)
Called to room to assist during endoscopic procedure.  Patient ID and intended procedure confirmed with present staff. Received instructions for my participation in the procedure from the performing physician.  

## 2017-01-24 NOTE — Progress Notes (Signed)
A/ox3 pleased with MAC, report to [facility]

## 2017-01-25 ENCOUNTER — Telehealth: Payer: Self-pay | Admitting: *Deleted

## 2017-01-25 NOTE — Telephone Encounter (Signed)
  Follow up Call-  Call back number 01/24/2017 03/27/2015  Post procedure Call Back phone  # [phone]  Permission to leave phone message Yes Yes  Some recent data might be hidden     Patient questions:  Do you have a fever, pain , or abdominal swelling? No. Pain Score  0 *  Have you tolerated food without any problems? Yes.    Have you been able to return to your normal activities? Yes.    Do you have any questions about your discharge instructions: Diet   No. Medications  No. Follow up visit  No.  Do you have questions or concerns about your Care? Yes.    Actions: * If pain score is 4 or above: No action needed, pain <4.

## 2017-01-27 ENCOUNTER — Other Ambulatory Visit: Payer: Self-pay | Admitting: Internal Medicine

## 2017-01-30 ENCOUNTER — Encounter: Payer: Self-pay | Admitting: Internal Medicine

## 2017-02-20 DIAGNOSIS — M79605 Pain in left leg: Secondary | ICD-10-CM | POA: Diagnosis not present

## 2017-02-20 DIAGNOSIS — R0781 Pleurodynia: Secondary | ICD-10-CM | POA: Diagnosis not present

## 2017-02-20 DIAGNOSIS — M79604 Pain in right leg: Secondary | ICD-10-CM | POA: Diagnosis not present

## 2017-03-06 DIAGNOSIS — R0781 Pleurodynia: Secondary | ICD-10-CM | POA: Diagnosis not present

## 2017-03-08 ENCOUNTER — Ambulatory Visit (INDEPENDENT_AMBULATORY_CARE_PROVIDER_SITE_OTHER): Payer: PPO | Admitting: Internal Medicine

## 2017-03-08 VITALS — BP 118/76 | HR 56 | Temp 97.4°F | Resp 16 | Ht 64.0 in | Wt 144.2 lb

## 2017-03-08 DIAGNOSIS — E559 Vitamin D deficiency, unspecified: Secondary | ICD-10-CM | POA: Diagnosis not present

## 2017-03-08 DIAGNOSIS — R7303 Prediabetes: Secondary | ICD-10-CM

## 2017-03-08 DIAGNOSIS — I10 Essential (primary) hypertension: Secondary | ICD-10-CM | POA: Diagnosis not present

## 2017-03-08 DIAGNOSIS — E782 Mixed hyperlipidemia: Secondary | ICD-10-CM | POA: Diagnosis not present

## 2017-03-08 DIAGNOSIS — K2100 Gastro-esophageal reflux disease with esophagitis, without bleeding: Secondary | ICD-10-CM

## 2017-03-08 DIAGNOSIS — R7309 Other abnormal glucose: Secondary | ICD-10-CM

## 2017-03-08 DIAGNOSIS — Z79899 Other long term (current) drug therapy: Secondary | ICD-10-CM

## 2017-03-08 LAB — CBC WITH DIFFERENTIAL/PLATELET
BASOS PCT: 0 %
Basophils Absolute: 0 cells/uL (ref 0–200)
Eosinophils Absolute: 204 cells/uL (ref 15–500)
Eosinophils Relative: 3 %
HEMATOCRIT: 34.8 % — AB (ref 35.0–45.0)
Hemoglobin: 11.5 g/dL — ABNORMAL LOW (ref 11.7–15.5)
LYMPHS PCT: 33 %
Lymphs Abs: 2244 cells/uL (ref 850–3900)
MCH: 30.5 pg (ref 27.0–33.0)
MCHC: 33 g/dL (ref 32.0–36.0)
MCV: 92.3 fL (ref 80.0–100.0)
MONO ABS: 408 {cells}/uL (ref 200–950)
MPV: 8.7 fL (ref 7.5–12.5)
Monocytes Relative: 6 %
Neutro Abs: 3944 cells/uL (ref 1500–7800)
Neutrophils Relative %: 58 %
PLATELETS: 275 10*3/uL (ref 140–400)
RBC: 3.77 MIL/uL — ABNORMAL LOW (ref 3.80–5.10)
RDW: 14.3 % (ref 11.0–15.0)
WBC: 6.8 10*3/uL (ref 3.8–10.8)

## 2017-03-08 LAB — HEPATIC FUNCTION PANEL
ALK PHOS: 77 U/L (ref 33–130)
ALT: 10 U/L (ref 6–29)
AST: 12 U/L (ref 10–35)
Albumin: 4.1 g/dL (ref 3.6–5.1)
BILIRUBIN DIRECT: 0.1 mg/dL (ref <=0.2)
BILIRUBIN INDIRECT: 0.4 mg/dL (ref 0.2–1.2)
BILIRUBIN TOTAL: 0.5 mg/dL (ref 0.2–1.2)
Total Protein: 6.5 g/dL (ref 6.1–8.1)

## 2017-03-08 LAB — BASIC METABOLIC PANEL WITH GFR
BUN: 14 mg/dL (ref 7–25)
CO2: 19 mmol/L — ABNORMAL LOW (ref 20–31)
Calcium: 8.7 mg/dL (ref 8.6–10.4)
Chloride: 108 mmol/L (ref 98–110)
Creat: 0.78 mg/dL (ref 0.50–0.99)
GFR, EST NON AFRICAN AMERICAN: 78 mL/min (ref >=60)
GLUCOSE: 86 mg/dL (ref 65–99)
POTASSIUM: 4 mmol/L (ref 3.5–5.3)
Sodium: 139 mmol/L (ref 135–146)

## 2017-03-08 LAB — LIPID PANEL
Cholesterol: 204 mg/dL — ABNORMAL HIGH (ref <200)
HDL: 90 mg/dL (ref >50)
LDL CALC: 101 mg/dL — AB (ref <100)
TRIGLYCERIDES: 65 mg/dL (ref <150)
Total CHOL/HDL Ratio: 2.3 Ratio (ref <5.0)
VLDL: 13 mg/dL (ref <30)

## 2017-03-08 LAB — TSH: TSH: 1.53 mIU/L

## 2017-03-08 NOTE — Progress Notes (Signed)
This very nice 71 y.o. MWF  presents for 3 month follow up with Hypertension, Hyperlipidemia, Pre-Diabetes and Vitamin D Deficiency.      Patient is treated for HTN (1998) & BP has been controlled at home. Today's BP is at goal - 118/76. Patient has had no complaints of any cardiac type chest pain, palpitations, dyspnea/orthopnea/PND, dizziness, claudication, or dependent edema.     Hyperlipidemia is controlled with diet & meds. Patient denies myalgias or other med SE's. Last Lipids were at goal: Lab Results  Component Value Date   CHOL 191 11/24/2016   HDL 85 11/24/2016   LDLCALC 94 11/24/2016   TRIG 61 11/24/2016   CHOLHDL 2.2 11/24/2016      Also, the patient has history of PreDiabetes (A1c 6.0% in 2015)  and has had no symptoms of reactive hypoglycemia, diabetic polys, paresthesias or visual blurring.  Last A1c was at goal: Lab Results  Component Value Date   HGBA1C 5.4 08/24/2016      Further, the patient also has history of Vitamin D Deficiency and supplements vitamin D without any suspected side-effects. Last vitamin D was at goal:  Lab Results  Component Value Date   VD25OH 65 08/24/2016   Current Outpatient Prescriptions on File Prior to Visit  Medication Sig  . atorvastatin (LIPITOR) 80 MG tablet TAKE 1/2 TO 1 TABLET DAILY OR AS DIRECTED FOR CHOLESTEROL  . B Complex Vitamins (B COMPLEX PO) Take 1 tablet by mouth every other day.   . Cholecalciferol (VITAMIN D3) 5000 UNITS TABS Take 5,000 Units by mouth daily.   Marland Kitchen LORazepam (ATIVAN) 1 MG tablet 1-2 AT BEDTIME AS NEEDED FOR SLEEP  . Magnesium 250 MG TABS Take 250 mg by mouth daily.   . metoprolol tartrate (LOPRESSOR) 25 MG tablet Take 1 tablet (25 mg total) by mouth daily.  Marland Kitchen omeprazole (PRILOSEC) 40 MG capsule Take 1 capsule (40 mg total) by mouth 2 (two) times daily.  . sertraline (ZOLOFT) 100 MG tablet TAKE 1 TABLET (100 MG TOTAL) BY MOUTH DAILY.   Current Facility-Administered Medications on File Prior to Visit    Medication  . 0.9 %  sodium chloride infusion   Allergies  Allergen Reactions  . Bactrim [Sulfamethoxazole-Trimethoprim] Swelling    Face and eyes swollen and red.  Marland Kitchen Ketek [Telithromycin]     Unknown   PMHx:   Past Medical History:  Diagnosis Date  . Anemia   . Anxiety   . Arthritis    hands and spine  . Blood transfusion without reported diagnosis   . Cervical dysplasia 1976  . Diverticulitis of colon (without mention of hemorrhage)(562.11)   . Diverticulosis of colon (without mention of hemorrhage)   . Elevated cholesterol   . GERD (gastroesophageal reflux disease)   . Heart murmur   . Hypertension    pt states she takes med for tachycardia NOT HTN -metoprolol taken for tachycardia  . Internal hemorrhoids without mention of complication   . Mitral valve disorders(424.0)   . MRSA infection   . Osteoporosis    spine  . Palpitations   . Pelvic adhesive disease   . Plaque psoriasis   . Prediabetes   . Tachycardia    never had HTN  . Vitamin D deficiency    Immunization History  Administered Date(s) Administered  . DT 04/27/2015  . Pneumococcal Conjugate-13 04/27/2015  . Pneumococcal Polysaccharide-23 12/22/2004, 11/24/2016  . Rabies, IM 05/04/2016, 05/07/2016  . Td 12/16/2003   Past Surgical History:  Procedure Laterality Date  . ABDOMINAL HYSTERECTOMY  2000   TAH,BSO endometriosis, leiomyomata  . COLONOSCOPY    . [place]  . ESOPHAGOGASTRODUODENOSCOPY    . FINGER SURGERY    . GANGLION CYST EXCISION Right    hand   . GYNECOLOGIC CRYOSURGERY  1976  . HEMORRHOID SURGERY    . OOPHORECTOMY    . ROTATOR CUFF REPAIR Left   . ROTATOR CUFF REPAIR Right 2/15  . TYMPANOPLASTY Right   . UPPER GASTROINTESTINAL ENDOSCOPY     FHx:    Reviewed / unchanged  SHx:    Reviewed / unchanged  Systems Review:  Constitutional: Denies fever, chills, wt changes, headaches, insomnia, fatigue, night sweats, change in appetite. Eyes: Denies  redness, blurred vision, diplopia, discharge, itchy, watery eyes.  ENT: Denies discharge, congestion, post nasal drip, epistaxis, sore throat, earache, hearing loss, dental pain, tinnitus, vertigo, sinus pain, snoring.  CV: Denies chest pain, palpitations, irregular heartbeat, syncope, dyspnea, diaphoresis, orthopnea, PND, claudication or edema. Respiratory: denies cough, dyspnea, DOE, pleurisy, hoarseness, laryngitis, wheezing.  Gastrointestinal: Denies dysphagia, odynophagia, heartburn, reflux, water brash, abdominal pain or cramps, nausea, vomiting, bloating, diarrhea, constipation, hematemesis, melena, hematochezia  or hemorrhoids. Genitourinary: Denies dysuria, frequency, urgency, nocturia, hesitancy, discharge, hematuria or flank pain. Musculoskeletal: Denies arthralgias, myalgias, stiffness, jt. swelling, pain, limping or strain/sprain.  Skin: Denies pruritus, rash, hives, warts, acne, eczema or change in skin lesion(s). Neuro: No weakness, tremor, incoordination, spasms, paresthesia or pain. Psychiatric: Denies confusion, memory loss or sensory loss. Endo: Denies change in weight, skin or hair change.  Heme/Lymph: No excessive bleeding, bruising or enlarged lymph nodes.  Physical Exam  BP 118/76   Pulse (!) 56   Temp (!) 97.4 F (36.3 C)   Resp 16   Ht 5\' 4"  (1.626 m)   Wt 144 lb 3.2 oz (65.4 kg)   BMI 24.75 kg/m   Appears well nourished, well groomed  and in no distress.  Eyes: PERRLA, EOMs, conjunctiva no swelling or erythema. Sinuses: No frontal/maxillary tenderness ENT/Mouth: EAC's clear, TM's nl w/o erythema, bulging. Nares clear w/o erythema, swelling, exudates. Oropharynx clear without erythema or exudates. Oral hygiene is good. Tongue normal, non obstructing. Hearing intact.  Neck: Supple. Thyroid nl. Car 2+/2+ without bruits, nodes or JVD. Chest: Respirations nl with BS clear & equal w/o rales, rhonchi, wheezing or stridor.  Cor: Heart sounds normal w/ regular rate  and rhythm without sig. murmurs, gallops, clicks or rubs. Peripheral pulses normal and equal  without edema.  Abdomen: Soft & bowel sounds normal. Non-tender w/o guarding, rebound, hernias, masses or organomegaly.  Lymphatics: Unremarkable.  Musculoskeletal: Full ROM all peripheral extremities, joint stability, 5/5 strength and normal gait.  Skin: Warm, dry without exposed rashes, lesions or ecchymosis apparent.  Neuro: Cranial nerves intact, reflexes equal bilaterally. Sensory-motor testing grossly intact. Tendon reflexes grossly intact.  Pysch: Alert & oriented x 3.  Insight and judgement nl & appropriate. No ideations.  Assessment and Plan:  1. Essential hypertension  - Continue medication, monitor blood pressure at home.  - Continue DASH diet. Reminder to go to the ER if any CP,  SOB, nausea, dizziness, severe HA, changes vision/speech.  - CBC with Differential/Platelet - BASIC METABOLIC PANEL WITH GFR - Magnesium - TSH  2. Mixed hyperlipidemia  - Continue diet/meds, exercise,& lifestyle modifications.  - Continue monitor periodic cholesterol/liver & renal functions   - Hepatic function panel - Lipid panel - TSH  3. Prediabetes  - Continue diet, exercise,  lifestyle modifications.  - Monitor appropriate labs.  - Hemoglobin A1c - Insulin, random  4. Vitamin D deficiency  - Continue supplementation.  - VITAMIN D 25 Hydroxy   5. Other abnormal glucose  - Hemoglobin A1c - Insulin, random  6. Gastroesophageal reflux disease   7. Medication management  - CBC with Differential/Platelet - BASIC METABOLIC PANEL WITH GFR - Hepatic function panel - Magnesium - Lipid panel - TSH - Hemoglobin A1c - Insulin, random - VITAMIN D 25 Hydroxy        Discussed  regular exercise, BP monitoring, weight control to achieve/maintain BMI less than 25 and discussed med and SE's. Recommended labs to assess and monitor clinical status with further disposition pending results of  labs. Over 30 minutes of exam, counseling, chart review was performed.

## 2017-03-08 NOTE — Patient Instructions (Signed)

## 2017-03-09 LAB — VITAMIN D 25 HYDROXY (VIT D DEFICIENCY, FRACTURES): Vit D, 25-Hydroxy: 61 ng/mL (ref 30–100)

## 2017-03-09 LAB — HEMOGLOBIN A1C
Hgb A1c MFr Bld: 5.6 % (ref <5.7)
Mean Plasma Glucose: 114 mg/dL

## 2017-03-09 LAB — INSULIN, RANDOM: Insulin: 2.6 u[IU]/mL (ref 2.0–19.6)

## 2017-03-09 LAB — MAGNESIUM: Magnesium: 1.9 mg/dL (ref 1.5–2.5)

## 2017-03-11 ENCOUNTER — Encounter: Payer: Self-pay | Admitting: Internal Medicine

## 2017-04-12 ENCOUNTER — Other Ambulatory Visit: Payer: Self-pay | Admitting: Physician Assistant

## 2017-04-12 NOTE — Telephone Encounter (Signed)
Ativan was called into pharmacy on 15th Aug 2018 at 2pm by DD

## 2017-04-26 DIAGNOSIS — H7291 Unspecified perforation of tympanic membrane, right ear: Secondary | ICD-10-CM | POA: Diagnosis not present

## 2017-04-26 DIAGNOSIS — H6691 Otitis media, unspecified, right ear: Secondary | ICD-10-CM | POA: Diagnosis not present

## 2017-04-26 DIAGNOSIS — H6591 Unspecified nonsuppurative otitis media, right ear: Secondary | ICD-10-CM | POA: Diagnosis not present

## 2017-04-27 DIAGNOSIS — H722X1 Other marginal perforations of tympanic membrane, right ear: Secondary | ICD-10-CM | POA: Diagnosis not present

## 2017-05-02 DIAGNOSIS — W57XXXA Bitten or stung by nonvenomous insect and other nonvenomous arthropods, initial encounter: Secondary | ICD-10-CM | POA: Diagnosis not present

## 2017-05-02 DIAGNOSIS — D1800 Hemangioma unspecified site: Secondary | ICD-10-CM | POA: Diagnosis not present

## 2017-05-02 DIAGNOSIS — Z85828 Personal history of other malignant neoplasm of skin: Secondary | ICD-10-CM | POA: Diagnosis not present

## 2017-05-02 DIAGNOSIS — D225 Melanocytic nevi of trunk: Secondary | ICD-10-CM | POA: Diagnosis not present

## 2017-05-02 DIAGNOSIS — R238 Other skin changes: Secondary | ICD-10-CM | POA: Diagnosis not present

## 2017-05-02 DIAGNOSIS — B353 Tinea pedis: Secondary | ICD-10-CM | POA: Diagnosis not present

## 2017-05-02 DIAGNOSIS — L814 Other melanin hyperpigmentation: Secondary | ICD-10-CM | POA: Diagnosis not present

## 2017-05-02 DIAGNOSIS — L821 Other seborrheic keratosis: Secondary | ICD-10-CM | POA: Diagnosis not present

## 2017-05-18 ENCOUNTER — Other Ambulatory Visit: Payer: Self-pay | Admitting: Cardiovascular Disease

## 2017-05-24 DIAGNOSIS — H722X1 Other marginal perforations of tympanic membrane, right ear: Secondary | ICD-10-CM | POA: Diagnosis not present

## 2017-05-24 DIAGNOSIS — H9201 Otalgia, right ear: Secondary | ICD-10-CM | POA: Diagnosis not present

## 2017-06-12 ENCOUNTER — Other Ambulatory Visit: Payer: Self-pay | Admitting: Physician Assistant

## 2017-06-13 ENCOUNTER — Ambulatory Visit (INDEPENDENT_AMBULATORY_CARE_PROVIDER_SITE_OTHER): Payer: PPO | Admitting: Physician Assistant

## 2017-06-13 ENCOUNTER — Encounter: Payer: Self-pay | Admitting: Physician Assistant

## 2017-06-13 VITALS — BP 116/78 | HR 71 | Temp 97.8°F | Resp 14 | Ht 64.0 in | Wt 141.4 lb

## 2017-06-13 DIAGNOSIS — Z79899 Other long term (current) drug therapy: Secondary | ICD-10-CM

## 2017-06-13 DIAGNOSIS — K5732 Diverticulitis of large intestine without perforation or abscess without bleeding: Secondary | ICD-10-CM

## 2017-06-13 DIAGNOSIS — L409 Psoriasis, unspecified: Secondary | ICD-10-CM

## 2017-06-13 DIAGNOSIS — Z136 Encounter for screening for cardiovascular disorders: Secondary | ICD-10-CM

## 2017-06-13 DIAGNOSIS — R6889 Other general symptoms and signs: Secondary | ICD-10-CM | POA: Diagnosis not present

## 2017-06-13 DIAGNOSIS — G4700 Insomnia, unspecified: Secondary | ICD-10-CM

## 2017-06-13 DIAGNOSIS — Z0001 Encounter for general adult medical examination with abnormal findings: Secondary | ICD-10-CM

## 2017-06-13 DIAGNOSIS — I059 Rheumatic mitral valve disease, unspecified: Secondary | ICD-10-CM

## 2017-06-13 DIAGNOSIS — R7309 Other abnormal glucose: Secondary | ICD-10-CM

## 2017-06-13 DIAGNOSIS — E559 Vitamin D deficiency, unspecified: Secondary | ICD-10-CM | POA: Diagnosis not present

## 2017-06-13 DIAGNOSIS — E782 Mixed hyperlipidemia: Secondary | ICD-10-CM | POA: Diagnosis not present

## 2017-06-13 DIAGNOSIS — I10 Essential (primary) hypertension: Secondary | ICD-10-CM | POA: Diagnosis not present

## 2017-06-13 DIAGNOSIS — M47812 Spondylosis without myelopathy or radiculopathy, cervical region: Secondary | ICD-10-CM

## 2017-06-13 DIAGNOSIS — F3342 Major depressive disorder, recurrent, in full remission: Secondary | ICD-10-CM | POA: Diagnosis not present

## 2017-06-13 DIAGNOSIS — J439 Emphysema, unspecified: Secondary | ICD-10-CM | POA: Insufficient documentation

## 2017-06-13 DIAGNOSIS — G609 Hereditary and idiopathic neuropathy, unspecified: Secondary | ICD-10-CM

## 2017-06-13 DIAGNOSIS — I700 Atherosclerosis of aorta: Secondary | ICD-10-CM | POA: Diagnosis not present

## 2017-06-13 MED ORDER — UMECLIDINIUM-VILANTEROL 62.5-25 MCG/INH IN AEPB: 1.0000 | INHALATION_SPRAY | Freq: Every day | RESPIRATORY_TRACT | 0 refills | Status: DC

## 2017-06-13 NOTE — Patient Instructions (Addendum)
Get the mold/carpet in your basement taken care of by professionals.   Try the anoro samples, once a day before you brush your teeth  Do the mixture of the ketoconazole and the clobetasol twice a day x 1 month  Chronic Obstructive Pulmonary Disease Chronic obstructive pulmonary disease (COPD) is a common lung condition in which airflow from the lungs is limited. COPD is a general term that can be used to describe many different lung problems that limit airflow, including both chronic bronchitis and emphysema. If you have COPD, your lung function will probably never return to normal, but there are measures you can take to improve lung function and make yourself feel better. What are the causes?  Smoking (common).  Exposure to secondhand smoke.  Genetic problems.  Chronic inflammatory lung diseases or recurrent infections. What are the signs or symptoms?  Shortness of breath, especially with physical activity.  Deep, persistent (chronic) cough with a large amount of thick mucus.  Wheezing.  Rapid breaths (tachypnea).  Gray or bluish discoloration (cyanosis) of the skin, especially in your fingers, toes, or lips.  Fatigue.  Weight loss.  Frequent infections or episodes when breathing symptoms become much worse (exacerbations).  Chest tightness. How is this diagnosed? Your health care provider will take a medical history and perform a physical examination to diagnose COPD. Additional tests for COPD may include:  Lung (pulmonary) function tests.  Chest X-ray.  CT scan.  Blood tests.  How is this treated? Treatment for COPD may include:  Inhaler and nebulizer medicines. These help manage the symptoms of COPD and make your breathing more comfortable.  Supplemental oxygen. Supplemental oxygen is only helpful if you have a low oxygen level in your blood.  Exercise and physical activity. These are beneficial for nearly all people with COPD.  Lung surgery or  transplant.  Nutrition therapy to gain weight, if you are underweight.  Pulmonary rehabilitation. This may involve working with a team of health care providers and specialists, such as respiratory, occupational, and physical therapists.  Follow these instructions at home:  Take all medicines (inhaled or pills) as directed by your health care provider.  Avoid over-the-counter medicines or cough syrups that dry up your airway (such as antihistamines) and slow down the elimination of secretions unless instructed otherwise by your health care provider.  If you are a smoker, the most important thing that you can do is stop smoking. Continuing to smoke will cause further lung damage and breathing trouble. Ask your health care provider for help with quitting smoking. He or she can direct you to community resources or hospitals that provide support.  Avoid exposure to irritants such as smoke, chemicals, and fumes that aggravate your breathing.  Use oxygen therapy and pulmonary rehabilitation if directed by your health care provider. If you require home oxygen therapy, ask your health care provider whether you should purchase a pulse oximeter to measure your oxygen level at home.  Avoid contact with individuals who have a contagious illness.  Avoid extreme temperature and humidity changes.  Eat healthy foods. Eating smaller, more frequent meals and resting before meals may help you maintain your strength.  Stay active, but balance activity with periods of rest. Exercise and physical activity will help you maintain your ability to do things you want to do.  Preventing infection and hospitalization is very important when you have COPD. Make sure to receive all the vaccines your health care provider recommends, especially the pneumococcal and influenza vaccines. Ask your health  care provider whether you need a pneumonia vaccine.  Learn and use relaxation techniques to manage stress.  Learn and use  controlled breathing techniques as directed by your health care provider. Controlled breathing techniques include: 1. Pursed lip breathing. Start by breathing in (inhaling) through your nose for 1 second. Then, purse your lips as if you were going to whistle and breathe out (exhale) through the pursed lips for 2 seconds. 2. Diaphragmatic breathing. Start by putting one hand on your abdomen just above your waist. Inhale slowly through your nose. The hand on your abdomen should move out. Then purse your lips and exhale slowly. You should be able to feel the hand on your abdomen moving in as you exhale.  Learn and use controlled coughing to clear mucus from your lungs. Controlled coughing is a series of short, progressive coughs. The steps of controlled coughing are: 1. Lean your head slightly forward. 2. Breathe in deeply using diaphragmatic breathing. 3. Try to hold your breath for 3 seconds. 4. Keep your mouth slightly open while coughing twice. 5. Spit any mucus out into a tissue. 6. Rest and repeat the steps once or twice as needed. Contact a health care provider if:  You are coughing up more mucus than usual.  There is a change in the color or thickness of your mucus.  Your breathing is more labored than usual.  Your breathing is faster than usual. Get help right away if:  You have shortness of breath while you are resting.  You have shortness of breath that prevents you from: ? Being able to talk. ? Performing your usual physical activities.  You have chest pain lasting longer than 5 minutes.  Your skin color is more cyanotic than usual.  You measure low oxygen saturations for longer than 5 minutes with a pulse oximeter. This information is not intended to replace advice given to you by your health care provider. Make sure you discuss any questions you have with your health care provider. Document Released: 05/25/2005 Document Revised: 01/21/2016 Document Reviewed:  04/11/2013 Elsevier Interactive Patient Education  2017 [place] OFF OF PPI's    Nexium/protonix/prilosec/Omeprazole/Dexilant/Aciphex are called PPI's, they are great at healing your stomach but should only be taken for a short period of time.     Recent studies have shown that taken for a long time they  can increase the risk of osteoporosis (weakening of your bones), pneumonia, low magnesium, restless legs, Cdiff (infection that causes diarrhea), DEMENTIA and most recently kidney damage / disease / insufficiency.     Due to this information we want to try to stop the PPI but if you try to stop it abruptly this can cause rebound acid and worsening symptoms.   So this is how we want you to get off the PPI:  - Start taking the nexium/protonix/prilosec/PPI  every other day with  zantac (ranitidine) OR pepcid (famotadine) 2 x a day for 2-4 weeks - some people stay on this dosage and can not taper off further. Our main goal is to limit the dosage and amount you are taking so if you need to stay on this dose.   - then decrease the PPI to every 3 days while taking the zantac or pepcid twice a day the other  days for 2-4  Weeks  - then you can try the zantac (ranitidine) 300mg  once at night or up to 2 x day as needed.  - you can continue on this once  at night or stop all together  - Avoid alcohol, spicy foods, NSAIDS (aleve, ibuprofen) at this time. See foods below.   +++++++++++++++++++++++++++++++++++++++++++  Food Choices for Gastroesophageal Reflux Disease  When you have gastroesophageal reflux disease (GERD), the foods you eat and your eating habits are very important. Choosing the right foods can help ease the discomfort of GERD. WHAT GENERAL GUIDELINES DO I NEED TO FOLLOW?  Choose fruits, vegetables, whole grains, low-fat dairy products, and low-fat meat, fish, and poultry.  Limit fats such as oils, salad dressings, butter, nuts, and avocado.  Keep a food diary to  identify foods that cause symptoms.  Avoid foods that cause reflux. These may be different for different people.  Eat frequent small meals instead of three large meals each day.  Eat your meals slowly, in a relaxed setting.  Limit fried foods.  Cook foods using methods other than frying.  Avoid drinking alcohol.  Avoid drinking large amounts of liquids with your meals.  Avoid bending over or lying down until 2-3 hours after eating.   WHAT FOODS ARE NOT RECOMMENDED? The following are some foods and drinks that may worsen your symptoms:  Vegetables Tomatoes. Tomato juice. Tomato and spaghetti sauce. Chili peppers. Onion and garlic. Horseradish. Fruits Oranges, grapefruit, and lemon (fruit and juice). Meats High-fat meats, fish, and poultry. This includes hot dogs, ribs, ham, sausage, salami, and bacon. Dairy Whole milk and chocolate milk. Sour cream. Cream. Butter. Ice cream. Cream cheese.  Beverages Coffee and tea, with or without caffeine. Carbonated beverages or energy drinks. Condiments Hot sauce. Barbecue sauce.  Sweets/Desserts Chocolate and cocoa. Donuts. Peppermint and spearmint. Fats and Oils High-fat foods, including [place] fries and potato chips. Other Vinegar. Strong spices, such as black pepper, white pepper, red pepper, cayenne, curry powder, cloves, ginger, and chili powder. Nexium/protonix/prilosec are called PPI's, they are great at healing your stomach but should only be taken for a short period of time.
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
## 2017-06-14 LAB — URINALYSIS, ROUTINE W REFLEX MICROSCOPIC
Bacteria, UA: NONE SEEN /HPF
Bilirubin Urine: NEGATIVE
GLUCOSE, UA: NEGATIVE
HYALINE CAST: NONE SEEN /LPF
KETONES UR: NEGATIVE
Leukocytes, UA: NEGATIVE
NITRITE: NEGATIVE
PROTEIN: NEGATIVE
Specific Gravity, Urine: 1.019 (ref 1.001–1.03)
Squamous Epithelial / LPF: NONE SEEN /HPF (ref <=5)
WBC, UA: NONE SEEN /HPF (ref 0–5)
pH: <=5 (ref 5.0–8.0)

## 2017-06-14 LAB — LIPID PANEL
Cholesterol: 206 mg/dL — ABNORMAL HIGH (ref <200)
HDL: 83 mg/dL (ref >50)
LDL Cholesterol (Calc): 104 mg/dL (calc) — ABNORMAL HIGH
NON-HDL CHOLESTEROL (CALC): 123 mg/dL (ref <130)
TRIGLYCERIDES: 92 mg/dL (ref <150)
Total CHOL/HDL Ratio: 2.5 (calc) (ref <5.0)

## 2017-06-14 LAB — MICROALBUMIN / CREATININE URINE RATIO
Creatinine, Urine: 140 mg/dL (ref 20–275)
MICROALB/CREAT RATIO: 4 ug/mg{creat} (ref <30)
Microalb, Ur: 0.5 mg/dL

## 2017-06-14 LAB — BASIC METABOLIC PANEL WITH GFR
BUN: 12 mg/dL (ref 7–25)
CALCIUM: 9.3 mg/dL (ref 8.6–10.4)
CHLORIDE: 105 mmol/L (ref 98–110)
CO2: 28 mmol/L (ref 20–32)
Creat: 0.74 mg/dL (ref 0.50–0.99)
GFR, EST NON AFRICAN AMERICAN: 83 mL/min/{1.73_m2} (ref >=60)
GFR, Est African American: 96 mL/min/{1.73_m2} (ref >=60)
Glucose, Bld: 87 mg/dL (ref 65–99)
Potassium: 5 mmol/L (ref 3.5–5.3)
SODIUM: 138 mmol/L (ref 135–146)

## 2017-06-14 LAB — CBC WITH DIFFERENTIAL/PLATELET
BASOS ABS: 41 {cells}/uL (ref 0–200)
Basophils Relative: 0.5 %
Eosinophils Absolute: 227 cells/uL (ref 15–500)
Eosinophils Relative: 2.8 %
HEMATOCRIT: 35.2 % (ref 35.0–45.0)
Hemoglobin: 11.9 g/dL (ref 11.7–15.5)
LYMPHS ABS: 2390 {cells}/uL (ref 850–3900)
MCH: 30.8 pg (ref 27.0–33.0)
MCHC: 33.8 g/dL (ref 32.0–36.0)
MCV: 91.2 fL (ref 80.0–100.0)
MPV: 9.2 fL (ref 7.5–12.5)
Monocytes Relative: 6.3 %
NEUTROS PCT: 60.9 %
Neutro Abs: 4933 cells/uL (ref 1500–7800)
PLATELETS: 290 10*3/uL (ref 140–400)
RBC: 3.86 10*6/uL (ref 3.80–5.10)
RDW: 13.2 % (ref 11.0–15.0)
TOTAL LYMPHOCYTE: 29.5 %
WBC: 8.1 10*3/uL (ref 3.8–10.8)
WBCMIX: 510 {cells}/uL (ref 200–950)

## 2017-06-14 LAB — HEPATIC FUNCTION PANEL
AG Ratio: 1.6 (calc) (ref 1.0–2.5)
ALKALINE PHOSPHATASE (APISO): 63 U/L (ref 33–130)
ALT: 10 U/L (ref 6–29)
AST: 14 U/L (ref 10–35)
Albumin: 4.2 g/dL (ref 3.6–5.1)
BILIRUBIN INDIRECT: 0.4 mg/dL (ref 0.2–1.2)
BILIRUBIN TOTAL: 0.5 mg/dL (ref 0.2–1.2)
Bilirubin, Direct: 0.1 mg/dL (ref 0.0–0.2)
Globulin: 2.6 g/dL (calc) (ref 1.9–3.7)
Total Protein: 6.8 g/dL (ref 6.1–8.1)

## 2017-06-14 LAB — TSH: TSH: 1.21 m[IU]/L (ref 0.40–4.50)

## 2017-07-12 ENCOUNTER — Other Ambulatory Visit: Payer: Self-pay | Admitting: Physician Assistant

## 2017-07-12 NOTE — Telephone Encounter (Signed)
ATIVAN HAS BEEN CALLED INTO PHARMACY ON NOV 14TH 2018 BY DD

## 2017-07-26 ENCOUNTER — Encounter: Payer: Self-pay | Admitting: Cardiovascular Disease

## 2017-07-31 NOTE — Progress Notes (Signed)
Patient ID: [name] [name], female   DOB: [birth year], 71 y.o.   MRN: [id number]   [name] [name] is seen today for F/U of palpitatiions, PAC's and  Smoking She denies SSCP, dyspnea, palpitations or syncope. She has no known structural heart disease.  No new issues seeing Dr Ernestine Conrad now   Had right shoulder surgery for torn tendon  Seeing Dr Onnie Graham  Injured right wrist with neuropathy during PT in March Now complaining of bilateral arm  weakness Has seen neuro and diagnosed with bilateral CTS and has left mulilevel cervical foraminal narrowing  ? Epidural planned with Dr Nelva Bush    CXR done 12/15/16 suggested a pulmonary nodule but f/u CT with none only 8 mm left renal cyst  Doing well with no cardiac complaints   Lab Results  Component Value Date   CHOL 206 (H) 06/13/2017   HDL 83 06/13/2017   LDLCALC 101 (H) 03/08/2017   TRIG 92 06/13/2017   CHOLHDL 2.5 06/13/2017   Lab Results  Component Value Date   ALT 10 06/13/2017   AST 14 06/13/2017   ALKPHOS 77 03/08/2017   BILITOT 0.5 06/13/2017    ROS: Denies fever, malais, weight loss, blurry vision, decreased visual acuity, cough, sputum, SOB, hemoptysis, pleuritic pain, palpitaitons, heartburn, abdominal pain, melena, lower extremity edema, claudication, or rash.  All other systems reviewed and negative  General: BP 122/68   Pulse 68   Ht 5\' 4"  (1.626 m)   Wt 143 lb 6.4 oz (65 kg)   SpO2 96%   BMI 24.61 kg/m  Affect appropriate Healthy:  appears stated age 31: normal Neck supple with no adenopathy JVP normal no bruits no thyromegaly Lungs clear with no wheezing and good diaphragmatic motion Heart:  S1/S2 no murmur, no rub, gallop or click PMI normal Abdomen: benighn, BS positve, no tenderness, no AAA no bruit.  No HSM or HJR Distal pulses intact with no bruits No edema Neuro non-focal Skin warm and dry No muscular weakness    Current Outpatient Medications  Medication Sig Dispense Refill  . atorvastatin (LIPITOR)  80 MG tablet TAKE 1/2 TO 1 TABLET DAILY OR AS DIRECTED FOR CHOLESTEROL (Patient taking differently: TAKE 1/2 TO 1 TABLET DAILY OR AS DIRECTED FOR CHOLESTEROL-per pt takes three times weekly) 90 tablet 1  . B Complex Vitamins (B COMPLEX PO) Take 1 tablet by mouth every other day.     . Cholecalciferol (VITAMIN D3) 5000 UNITS TABS Take 5,000 Units by mouth daily.     Marland Kitchen LORazepam (ATIVAN) 1 MG tablet 1-2 TABLETS BY MOUTH AT BEDTIME AS NEEDED FOR SLEEP 60 tablet 1  . Magnesium 250 MG TABS Take 250 mg by mouth daily.     . metoprolol tartrate (LOPRESSOR) 25 MG tablet TAKE ONE TABLET BY MOUTH DAILY 90 tablet 1  . omeprazole (PRILOSEC) 40 MG capsule Take 1 capsule (40 mg total) by mouth 2 (two) times daily. (Patient taking differently: Take 40 mg by mouth daily. ) 60 capsule 11  . sertraline (ZOLOFT) 100 MG tablet TAKE ONE TABLET BY MOUTH DAILY 90 tablet 1  . umeclidinium-vilanterol (ANORO ELLIPTA) 62.5-25 MCG/INH AEPB Inhale 1 puff into the lungs daily. Make sure you rinse your mouth after each use. 1 each 0   No current facility-administered medications for this visit.     Allergies  Bactrim [sulfamethoxazole-trimethoprim] and Ketek [telithromycin]  Electrocardiogram:  SR rate 55 normal  02/18/14  Assessment and Plan Palpitations/PAC;s: benign continue beta blocker Elevated Lipids: takes 40 mg lipitor 3x/week  Cholesterol is at goal.  Continue current dose of statin and diet Rx.  No myalgias or side effects.  F/U  LFT's in 6 months. Lab Results  Component Value Date   LDLCALC 101 (H) 03/08/2017          Cervical Spine: f/u neuro stable GERD: related to smoking and wine continue prilosec Depression: improved on Zoloft Smoking:  Still has occasional cigarettes when she drinks wine   CT chest 12/22/16 ok no nodules    Jenkins Rouge

## 2017-08-02 ENCOUNTER — Ambulatory Visit: Payer: PPO | Admitting: Cardiovascular Disease

## 2017-08-02 ENCOUNTER — Encounter: Payer: Self-pay | Admitting: Cardiovascular Disease

## 2017-08-02 VITALS — BP 122/68 | HR 68 | Ht 64.0 in | Wt 143.4 lb

## 2017-08-02 DIAGNOSIS — I10 Essential (primary) hypertension: Secondary | ICD-10-CM | POA: Diagnosis not present

## 2017-08-02 DIAGNOSIS — R002 Palpitations: Secondary | ICD-10-CM | POA: Diagnosis not present

## 2017-08-02 NOTE — Patient Instructions (Addendum)

## 2017-09-05 DIAGNOSIS — L308 Other specified dermatitis: Secondary | ICD-10-CM | POA: Diagnosis not present

## 2017-09-05 DIAGNOSIS — L280 Lichen simplex chronicus: Secondary | ICD-10-CM | POA: Diagnosis not present

## 2017-09-07 ENCOUNTER — Other Ambulatory Visit: Payer: Self-pay | Admitting: Physician Assistant

## 2017-09-13 DIAGNOSIS — H2513 Age-related nuclear cataract, bilateral: Secondary | ICD-10-CM | POA: Diagnosis not present

## 2017-09-19 DIAGNOSIS — L280 Lichen simplex chronicus: Secondary | ICD-10-CM | POA: Diagnosis not present

## 2017-09-19 DIAGNOSIS — L308 Other specified dermatitis: Secondary | ICD-10-CM | POA: Diagnosis not present

## 2017-09-19 DIAGNOSIS — L820 Inflamed seborrheic keratosis: Secondary | ICD-10-CM | POA: Diagnosis not present

## 2017-10-04 DIAGNOSIS — L280 Lichen simplex chronicus: Secondary | ICD-10-CM | POA: Diagnosis not present

## 2017-10-21 IMAGING — DX DG CHEST 2V
2 series · 2 of 2 positions shown · non-contrast
Comparison: 11/24/2015

CLINICAL DATA: Routine. No complaints. Hx of MVP. Smoker. History
of COPD.

EXAM:
CHEST  2 VIEW

[chest pa]
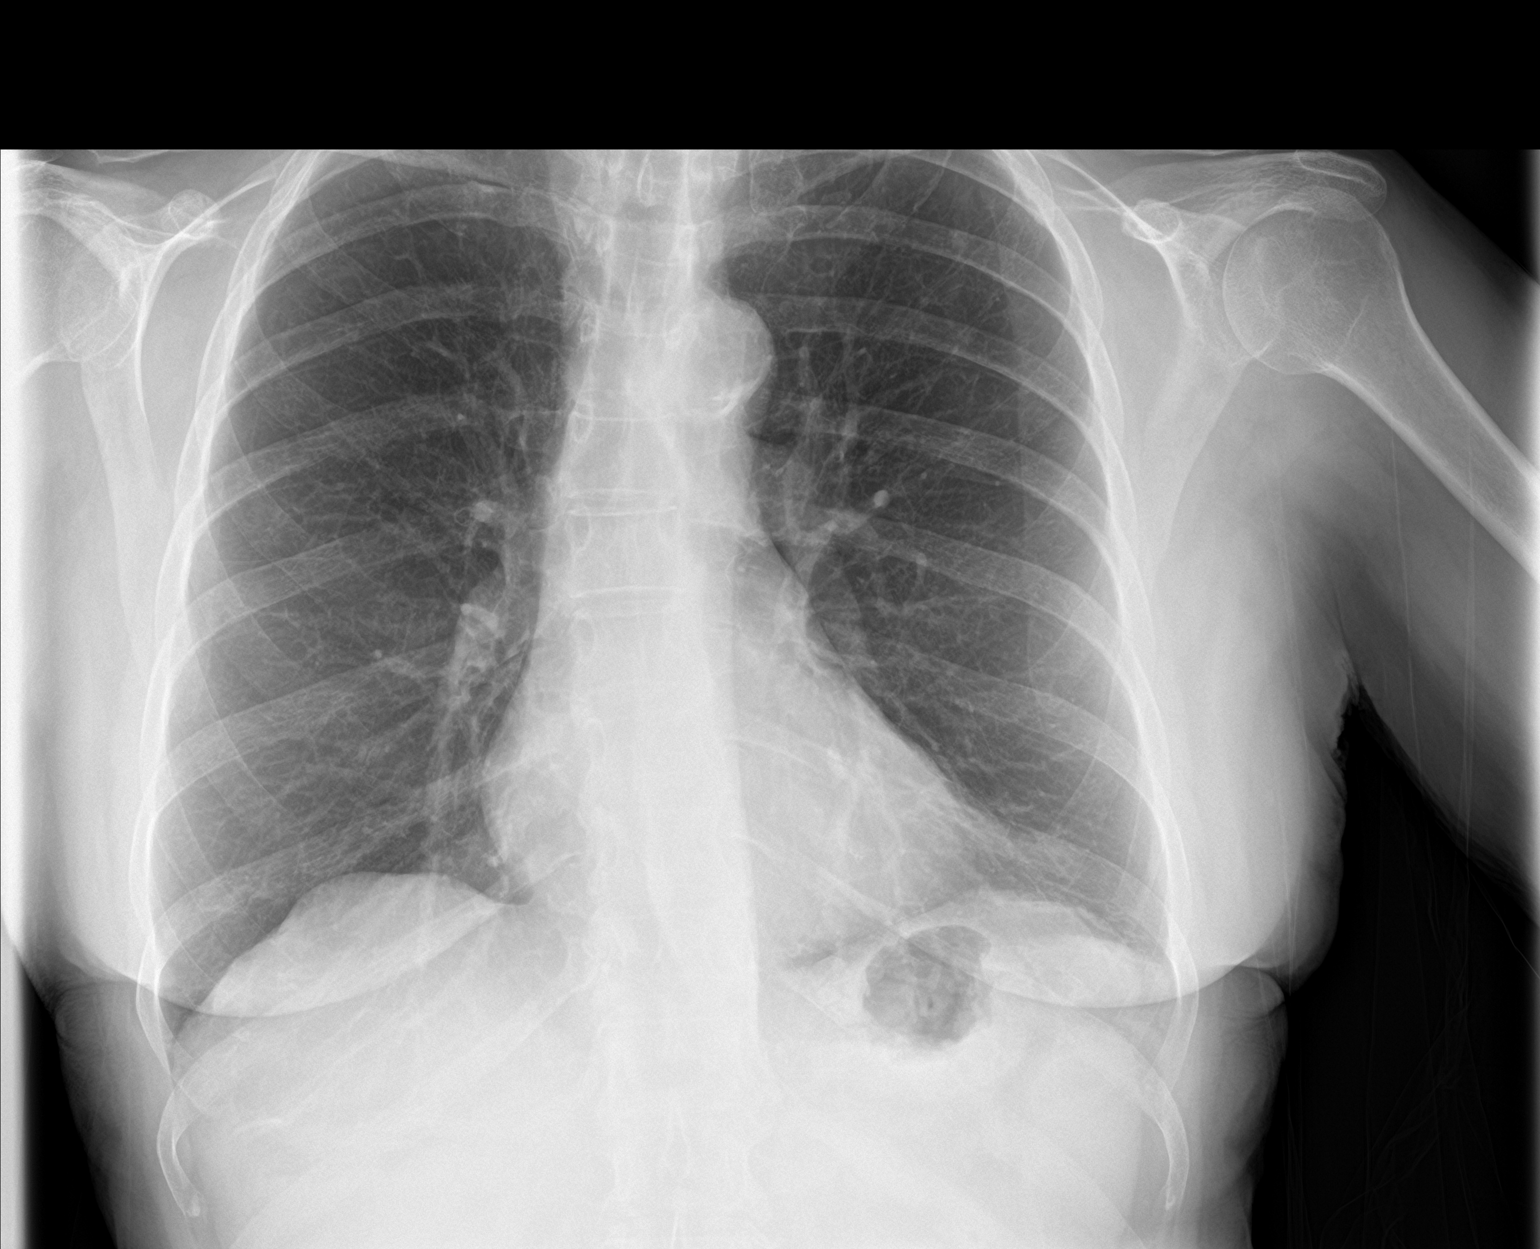

[chest lat]
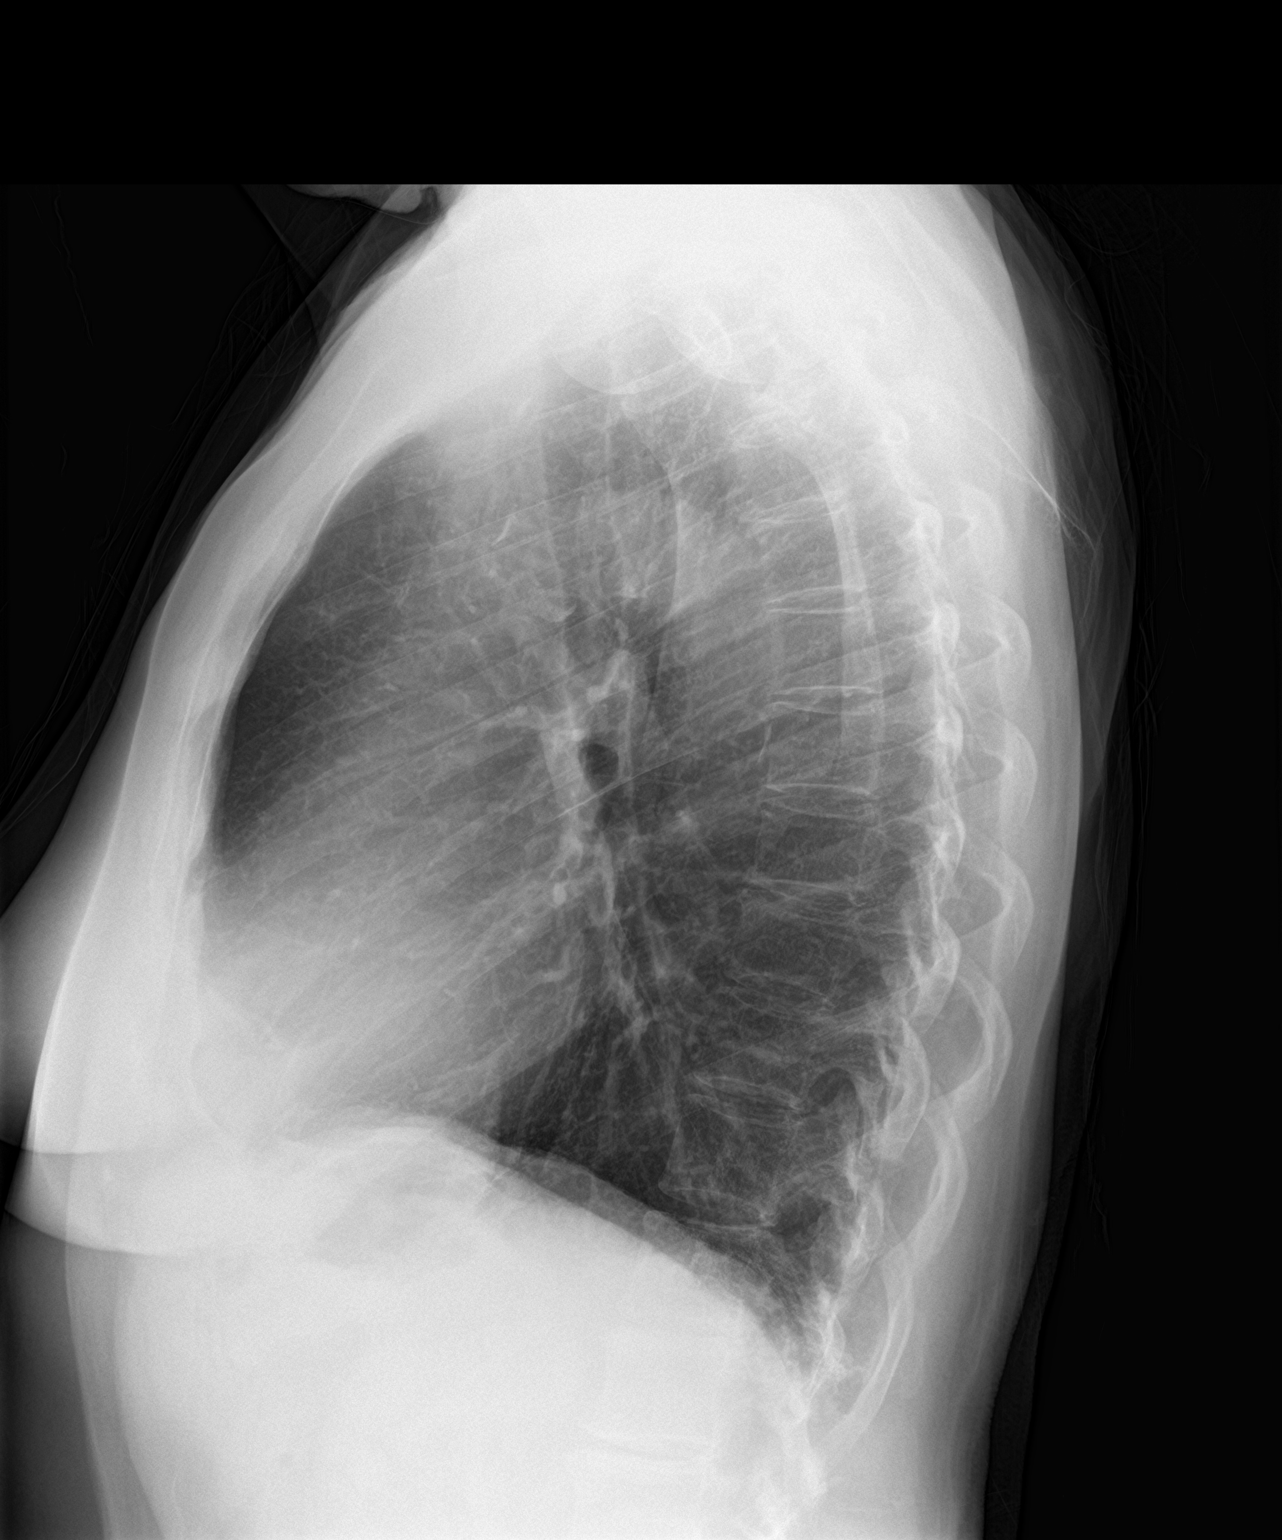

[2 of 2 positions shown; findings below may reference images not displayed]

FINDINGS: Normal heart size and pulmonary vascularity. 5 mm nodular opacity
over the left upper lung was not definitely present previously.
Consider CT versus follow-up radiograph to exclude pulmonary nodule.
No focal airspace disease or consolidation in the lungs. No blunting
of costophrenic angles. No pneumothorax. Mediastinal contours appear
intact. Mild hyperinflation consistent with emphysema. Slight
fibrosis in the lung bases. Calcified and tortuous aorta. Old
resection or resorption of the distal clavicles. No change since
previous study.
IMPRESSION: Emphysematous changes and scattered fibrosis in the lungs. 5 mm
nodular opacity over the left upper lung. Consider CT versus
follow-up radiograph to exclude a pulmonary nodule.

## 2017-10-26 DIAGNOSIS — D3131 Benign neoplasm of right choroid: Secondary | ICD-10-CM | POA: Diagnosis not present

## 2017-10-26 DIAGNOSIS — H5203 Hypermetropia, bilateral: Secondary | ICD-10-CM | POA: Diagnosis not present

## 2017-10-26 DIAGNOSIS — H25813 Combined forms of age-related cataract, bilateral: Secondary | ICD-10-CM | POA: Diagnosis not present

## 2017-10-26 DIAGNOSIS — H4321 Crystalline deposits in vitreous body, right eye: Secondary | ICD-10-CM | POA: Diagnosis not present

## 2017-11-09 ENCOUNTER — Other Ambulatory Visit: Payer: Self-pay | Admitting: Physician Assistant

## 2017-11-22 ENCOUNTER — Other Ambulatory Visit: Payer: Self-pay | Admitting: Physician Assistant

## 2017-12-12 ENCOUNTER — Ambulatory Visit (INDEPENDENT_AMBULATORY_CARE_PROVIDER_SITE_OTHER): Payer: PPO | Admitting: Internal Medicine

## 2017-12-12 ENCOUNTER — Encounter: Payer: Self-pay | Admitting: Internal Medicine

## 2017-12-12 VITALS — BP 124/84 | HR 64 | Temp 97.3°F | Resp 16 | Ht 64.0 in | Wt 146.8 lb

## 2017-12-12 DIAGNOSIS — R7303 Prediabetes: Secondary | ICD-10-CM

## 2017-12-12 DIAGNOSIS — R7309 Other abnormal glucose: Secondary | ICD-10-CM

## 2017-12-12 DIAGNOSIS — Z79899 Other long term (current) drug therapy: Secondary | ICD-10-CM | POA: Diagnosis not present

## 2017-12-12 DIAGNOSIS — E559 Vitamin D deficiency, unspecified: Secondary | ICD-10-CM | POA: Diagnosis not present

## 2017-12-12 DIAGNOSIS — E782 Mixed hyperlipidemia: Secondary | ICD-10-CM

## 2017-12-12 DIAGNOSIS — I10 Essential (primary) hypertension: Secondary | ICD-10-CM

## 2017-12-12 NOTE — Progress Notes (Signed)
This very nice 72 y.o. MWF  presents for 6 month follow up with HTN, HLD, Pre-Diabetes and Vitamin D Deficiency.      Patient is treated for HTN (1998)  & BP has been controlled at home. Today's BP is at goal - 124/84. Patient has had no complaints of any cardiac type chest pain, palpitations, dyspnea / orthopnea / PND, dizziness, claudication, or dependent edema.     Hyperlipidemia is near controlled with diet & meds. Patient denies myalgias or other med SE's. Last Lipids were near goal: Lab Results  Component Value Date   CHOL 206 (H) 06/13/2017   HDL 83 06/13/2017   LDLCALC 104 (H) 06/13/2017   TRIG 92 06/13/2017   CHOLHDL 2.5 06/13/2017      Also, the patient has history of PreDiabetes (A1c 6.0%/2015)  and has had no symptoms of reactive hypoglycemia, diabetic polys, paresthesias or visual blurring.  Last A1c was Normal & at goal: Lab Results  Component Value Date   HGBA1C 5.6 03/08/2017      Further, the patient also has history of Vitamin D Deficiency and supplements vitamin D without any suspected side-effects. Last vitamin D was at goal:  Lab Results  Component Value Date   VD25OH 61 03/08/2017   Current Outpatient Medications on File Prior to Visit  Medication Sig  . atorvastatin (LIPITOR) 80 MG tablet TAKE 1/2 TO 1 TABLET DAILY OR AS DIRECTED FOR CHOLESTEROL (Patient taking differently: TAKE 1/2 TO 1 TABLET DAILY OR AS DIRECTED FOR CHOLESTEROL-per pt takes three times weekly)  . B Complex Vitamins (B COMPLEX PO) Take 1 tablet by mouth every other day.   . Cholecalciferol (VITAMIN D3) 5000 UNITS TABS Take 5,000 Units by mouth daily.   Marland Kitchen LORazepam (ATIVAN) 1 MG tablet TAKE ONE TO TWO TABLETS BY MOUTH EVERY NIGHT AT BEDTIME AS NEEDED FOR SLEEP  . Magnesium 250 MG TABS Take 250 mg by mouth daily.   . metoprolol tartrate (LOPRESSOR) 25 MG tablet TAKE ONE TABLET BY MOUTH DAILY  . omeprazole (PRILOSEC) 40 MG capsule Take 1 capsule by mouth before breakfast daily.  .  sertraline (ZOLOFT) 100 MG tablet TAKE ONE TABLET BY MOUTH DAILY  . umeclidinium-vilanterol (ANORO ELLIPTA) 62.5-25 MCG/INH AEPB Inhale 1 puff into the lungs daily. Make sure you rinse your mouth after each use.   No current facility-administered medications on file prior to visit.    Allergies  Allergen Reactions  . Bactrim [Sulfamethoxazole-Trimethoprim] Swelling    Face and eyes swollen and red.  Marland Kitchen Ketek [Telithromycin]     Unknown   PMHx:   Past Medical History:  Diagnosis Date  . Anemia   . Anxiety   . Arthritis    hands and spine  . Blood transfusion without reported diagnosis   . Cervical dysplasia 1976  . Diverticulitis of colon (without mention of hemorrhage)(562.11)   . Diverticulosis of colon (without mention of hemorrhage)   . Elevated cholesterol   . GERD (gastroesophageal reflux disease)   . Heart murmur   . Hypertension    pt states she takes med for tachycardia NOT HTN -metoprolol taken for tachycardia  . Internal hemorrhoids without mention of complication   . Mitral valve disorders(424.0)   . MRSA infection   . Osteoporosis    spine  . Palpitations   . Pelvic adhesive disease   . Plaque psoriasis   . Prediabetes   . Tachycardia    never had HTN  . Vitamin D  deficiency    Immunization History  Administered Date(s) Administered  . DT 04/27/2015  . Pneumococcal Conjugate-13 04/27/2015  . Pneumococcal Polysaccharide-23 12/22/2004, 11/24/2016  . Rabies, IM 05/04/2016, 05/07/2016  . Td 12/16/2003   Past Surgical History:  Procedure Laterality Date  . ABDOMINAL HYSTERECTOMY  2000   TAH,BSO endometriosis, leiomyomata  . COLONOSCOPY    . [place]  . ESOPHAGOGASTRODUODENOSCOPY    . FINGER SURGERY    . GANGLION CYST EXCISION Right    hand   . GYNECOLOGIC CRYOSURGERY  1976  . HEMORRHOID SURGERY    . OOPHORECTOMY    . ROTATOR CUFF REPAIR Left   . ROTATOR CUFF REPAIR Right 2/15  . TYMPANOPLASTY Right   . UPPER  GASTROINTESTINAL ENDOSCOPY     FHx:    Reviewed / unchanged  SHx:    Reviewed / unchanged   Systems Review:  Constitutional: Denies fever, chills, wt changes, headaches, insomnia, fatigue, night sweats, change in appetite. Eyes: Denies redness, blurred vision, diplopia, discharge, itchy, watery eyes.  ENT: Denies discharge, congestion, post nasal drip, epistaxis, sore throat, earache, hearing loss, dental pain, tinnitus, vertigo, sinus pain, snoring.  CV: Denies chest pain, palpitations, irregular heartbeat, syncope, dyspnea, diaphoresis, orthopnea, PND, claudication or edema. Respiratory: denies cough, dyspnea, DOE, pleurisy, hoarseness, laryngitis, wheezing.  Gastrointestinal: Denies dysphagia, odynophagia, heartburn, reflux, water brash, abdominal pain or cramps, nausea, vomiting, bloating, diarrhea, constipation, hematemesis, melena, hematochezia  or hemorrhoids. Genitourinary: Denies dysuria, frequency, urgency, nocturia, hesitancy, discharge, hematuria or flank pain. Musculoskeletal: Denies arthralgias, myalgias, stiffness, jt. swelling, pain, limping or strain/sprain.  Skin: Denies pruritus, rash, hives, warts, acne, eczema or change in skin lesion(s). Neuro: No weakness, tremor, incoordination, spasms, paresthesia or pain. Psychiatric: Denies confusion, memory loss or sensory loss. Endo: Denies change in weight, skin or hair change.  Heme/Lymph: No excessive bleeding, bruising or enlarged lymph nodes.  Physical Exam  BP 124/84   Pulse 64   Temp (!) 97.3 F (36.3 C)   Resp 16   Ht 5\' 4"  (1.626 m)   Wt 146 lb 12.8 oz (66.6 kg)   BMI 25.20 kg/m   Appears  well nourished, well groomed  and in no distress.  Eyes: PERRLA, EOMs, conjunctiva no swelling or erythema. Sinuses: No frontal/maxillary tenderness ENT/Mouth: EAC's clear, TM's nl w/o erythema, bulging. Nares clear w/o erythema, swelling, exudates. Oropharynx clear without erythema or exudates. Oral hygiene is good.  Tongue normal, non obstructing. Hearing intact.  Neck: Supple. Thyroid not palpable. Car 2+/2+ without bruits, nodes or JVD. Chest: Respirations nl with BS clear & equal w/o rales, rhonchi, wheezing or stridor.  Cor: Heart sounds normal w/ regular rate and rhythm without sig. murmurs, gallops, clicks or rubs. Peripheral pulses normal and equal  without edema.  Abdomen: Soft & bowel sounds normal. Non-tender w/o guarding, rebound, hernias, masses or organomegaly.  Lymphatics: Unremarkable.  Musculoskeletal: Full ROM all peripheral extremities, joint stability, 5/5 strength and normal gait.  Skin: Warm, dry without exposed rashes, lesions or ecchymosis apparent.  Neuro: Cranial nerves intact, reflexes equal bilaterally. Sensory-motor testing grossly intact. Tendon reflexes grossly intact.  Pysch: Alert & oriented x 3.  Insight and judgement nl & appropriate. No ideations.  Assessment and Plan:  1. Essential hypertension  - Continue medication, monitor blood pressure at home.  - Continue DASH diet.  Reminder to go to the ER if any CP,  SOB, nausea, dizziness, severe HA, changes vision/speech.  - CBC with Differential/Platelet - BASIC METABOLIC PANEL WITH  GFR - Magnesium - TSH  2. Hyperlipidemia, mixed  - Continue diet/meds, exercise,& lifestyle modifications.  - Continue monitor periodic cholesterol/liver & renal functions   - Hepatic function panel - Lipid panel - TSH  3. Abnormal glucose  - Continue diet, exercise, lifestyle modifications.  - Monitor appropriate labs.  - Hemoglobin A1c - Insulin, random  4. Vitamin D deficiency  - Continue supplementation.   - VITAMIN D 25 Hydroxyl  5. Prediabetes  - Hemoglobin A1c - Insulin, random  6. Medication management  - CBC with Differential/Platelet - BASIC METABOLIC PANEL WITH GFR - Hepatic function panel - Magnesium - Lipid panel - TSH - Hemoglobin A1c - Insulin, random - VITAMIN D 25  Hydroxyl          Discussed  regular exercise, BP monitoring, weight control to achieve/maintain BMI less than 25 and discussed med and SE's. Recommended labs to assess and monitor clinical status with further disposition pending results of labs. Over 30 minutes of exam, counseling, chart review was performed.

## 2017-12-12 NOTE — Patient Instructions (Signed)

## 2017-12-13 LAB — BASIC METABOLIC PANEL WITH GFR
BUN: 13 mg/dL (ref 7–25)
CALCIUM: 9.3 mg/dL (ref 8.6–10.4)
CHLORIDE: 106 mmol/L (ref 98–110)
CO2: 28 mmol/L (ref 20–32)
Creat: 0.81 mg/dL (ref 0.60–0.93)
GFR, EST AFRICAN AMERICAN: 85 mL/min/{1.73_m2} (ref >=60)
GFR, EST NON AFRICAN AMERICAN: 74 mL/min/{1.73_m2} (ref >=60)
Glucose, Bld: 92 mg/dL (ref 65–99)
POTASSIUM: 4.9 mmol/L (ref 3.5–5.3)
SODIUM: 139 mmol/L (ref 135–146)

## 2017-12-13 LAB — HEPATIC FUNCTION PANEL
AG Ratio: 1.7 (calc) (ref 1.0–2.5)
ALKALINE PHOSPHATASE (APISO): 66 U/L (ref 33–130)
ALT: 10 U/L (ref 6–29)
AST: 12 U/L (ref 10–35)
Albumin: 4.3 g/dL (ref 3.6–5.1)
BILIRUBIN DIRECT: 0.1 mg/dL (ref 0.0–0.2)
BILIRUBIN TOTAL: 0.5 mg/dL (ref 0.2–1.2)
Globulin: 2.5 g/dL (calc) (ref 1.9–3.7)
Indirect Bilirubin: 0.4 mg/dL (calc) (ref 0.2–1.2)
Total Protein: 6.8 g/dL (ref 6.1–8.1)

## 2017-12-13 LAB — CBC WITH DIFFERENTIAL/PLATELET
BASOS PCT: 0.3 %
Basophils Absolute: 24 cells/uL (ref 0–200)
EOS ABS: 269 {cells}/uL (ref 15–500)
Eosinophils Relative: 3.4 %
HEMATOCRIT: 34.3 % — AB (ref 35.0–45.0)
HEMOGLOBIN: 11.9 g/dL (ref 11.7–15.5)
LYMPHS ABS: 1967 {cells}/uL (ref 850–3900)
MCH: 30.7 pg (ref 27.0–33.0)
MCHC: 34.7 g/dL (ref 32.0–36.0)
MCV: 88.6 fL (ref 80.0–100.0)
MPV: 9.2 fL (ref 7.5–12.5)
Monocytes Relative: 4.7 %
Neutro Abs: 5269 cells/uL (ref 1500–7800)
Neutrophils Relative %: 66.7 %
Platelets: 261 10*3/uL (ref 140–400)
RBC: 3.87 10*6/uL (ref 3.80–5.10)
RDW: 13.1 % (ref 11.0–15.0)
Total Lymphocyte: 24.9 %
WBC: 7.9 10*3/uL (ref 3.8–10.8)
WBCMIX: 371 {cells}/uL (ref 200–950)

## 2017-12-13 LAB — LIPID PANEL
Cholesterol: 204 mg/dL — ABNORMAL HIGH (ref <200)
HDL: 88 mg/dL (ref >50)
LDL Cholesterol (Calc): 98 mg/dL (calc)
NON-HDL CHOLESTEROL (CALC): 116 mg/dL (ref <130)
Total CHOL/HDL Ratio: 2.3 (calc) (ref <5.0)
Triglycerides: 86 mg/dL (ref <150)

## 2017-12-13 LAB — HEMOGLOBIN A1C
EAG (MMOL/L): 6.5 (calc)
Hgb A1c MFr Bld: 5.7 % of total Hgb — ABNORMAL HIGH (ref <5.7)
Mean Plasma Glucose: 117 (calc)

## 2017-12-13 LAB — VITAMIN D 25 HYDROXY (VIT D DEFICIENCY, FRACTURES): Vit D, 25-Hydroxy: 50 ng/mL (ref 30–100)

## 2017-12-13 LAB — TSH: TSH: 1.33 m[IU]/L (ref 0.40–4.50)

## 2017-12-13 LAB — MAGNESIUM: MAGNESIUM: 2 mg/dL (ref 1.5–2.5)

## 2017-12-13 LAB — INSULIN, RANDOM: Insulin: 1.8 u[IU]/mL — ABNORMAL LOW (ref 2.0–19.6)

## 2017-12-25 ENCOUNTER — Other Ambulatory Visit: Payer: Self-pay | Admitting: Internal Medicine

## 2018-02-07 ENCOUNTER — Other Ambulatory Visit: Payer: Self-pay | Admitting: Physician Assistant

## 2018-02-09 DIAGNOSIS — J209 Acute bronchitis, unspecified: Secondary | ICD-10-CM | POA: Diagnosis not present

## 2018-02-09 DIAGNOSIS — R0602 Shortness of breath: Secondary | ICD-10-CM | POA: Diagnosis not present

## 2018-02-09 DIAGNOSIS — Z79899 Other long term (current) drug therapy: Secondary | ICD-10-CM | POA: Diagnosis not present

## 2018-02-15 DIAGNOSIS — K219 Gastro-esophageal reflux disease without esophagitis: Secondary | ICD-10-CM | POA: Diagnosis not present

## 2018-02-15 DIAGNOSIS — J40 Bronchitis, not specified as acute or chronic: Secondary | ICD-10-CM | POA: Diagnosis not present

## 2018-03-12 ENCOUNTER — Other Ambulatory Visit: Payer: Self-pay | Admitting: Adult Health

## 2018-04-03 ENCOUNTER — Other Ambulatory Visit: Payer: Self-pay | Admitting: Adult Health

## 2018-04-06 NOTE — Progress Notes (Signed)
Medicare Wellness Visit and 3 month OV Assessment:   Encounter for annual medicare wellness visit - patient will schedule MMG  Atherosclerosis of aorta Control blood pressure, cholesterol, glucose, increase exercise.   Essential hypertension - continue medications, DASH diet, exercise and monitor at home. Call if greater than 130/80.  - CBC with Differential/Platelet - CMP/GFR  Emphysema STOP SMOKING, get CXR at CPE, bring inhaler next time to check technique  Prediabetes Discussed general issues about diabetes pathophysiology and management., Educational material distributed., Suggested low cholesterol diet., Encouraged aerobic exercise., Discussed foot care., Reminded to get yearly retinal exam.   Mixed hyperlipidemia Currently not taking atorvastatin due to large pill - looked up pills - she will discuss with her pharmacy as to what dose/brand would be a small pill  -continue medications, check lipids, decrease fatty foods, increase activity.    Vitamin D deficiency Continue supplement  Cervical arthritis RICE, NSAIDS, exercises given, if not better get xray and PT referral or ortho referral.    Diverticulitis of colon  follow up GI.   Mitral valve disorder No SOB, CP, edema, monitor  Medication management  Estrogen deficiency Dexa in 5 years  Depression  stress management techniques discussed, increase water, good sleep hygiene discussed, increase exercise, and increase veggies.  - will continue zoloft for now, when things calm down, may try to taper off  Insomnia Insomnia- good sleep hygiene discussed, increase day time activity,continue ativan - tapering down on dose Suggested trying benadryl 25-50 mg at night   Future Appointments  Date Time Provider [place]  07/19/2018  2:00 PM Vicie Mutters, PA-C [id number] None    Plan:   During the course of the visit the patient was educated and counseled about appropriate screening and preventive  services including:    Influenza vaccine  Td vaccine  Screening electrocardiogram  Screening mammography  Bone densitometry screening  Colorectal cancer screening  Diabetes screening  Glaucoma screening  Nutrition counseling    Subjective:   [name] [name] is a 72 y.o. female who presents for Medicare Annual Wellness Visit and 3 month follow up for HTN, chol, preDM, and depression.   She reports she is quite busy watching her elderly mother who is home bound at this time.   She smokes 1 pack of cigarettes/week - on the weekend with a bottle of wine. Has emphysema by imaging, asymptomatic, was prescribed anoro but felt like this made her choke up and didn't note any benefit so stopped.   She is on Ativan for sleep and zoloft for depression; she takes 1/2-1 tab of lorazepam at night to assist with sleep quality. She is currently cutting back on dose to try to taper off.   BMI is Body mass index is 25.06 kg/m., she has not been working on diet and exercise; she could walk 20 min walk if the weather was less hot.  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  She denies chest pain, shortness of breath, dizziness.   She is on cholesterol medication (atorvastatin 40 mg three times/ week, but hasn't been taking due to large pill causing pain) and denies myalgias. Her cholesterol is at goal. The cholesterol last visit was:  Lab Results  Component Value Date   CHOL 204 (H) 12/12/2017   HDL 88 12/12/2017   LDLCALC 98 12/12/2017   TRIG 86 12/12/2017   CHOLHDL 2.3 12/12/2017   She has been working on diet and exercise for prediabetes, and denies paresthesia of the feet, polydipsia, polyuria and visual disturbances. Last A1C in  the office was:  Lab Results  Component Value Date   HGBA1C 5.7 (H) 12/12/2017    Lab Results  Component Value Date   GFRNONAA 74 12/12/2017   Patient is on Vitamin D supplement. Lab Results  Component Value Date   VD25OH 50 12/12/2017      Medication Review Current Outpatient Medications on File Prior to Visit  Medication Sig Dispense Refill  . B Complex Vitamins (B COMPLEX PO) Take 1 tablet by mouth every other day.     . Cholecalciferol (VITAMIN D3) 5000 UNITS TABS Take 5,000 Units by mouth daily.     Marland Kitchen LORazepam (ATIVAN) 1 MG tablet TAKE ONE TO TWO TABLETS BY MOUTH EVERY NIGHT AT BEDTIME AS NEEDED FOR SLEEP 55 tablet 0  . Magnesium 250 MG TABS Take 250 mg by mouth daily.     . metoprolol tartrate (LOPRESSOR) 25 MG tablet TAKE ONE TABLET BY MOUTH DAILY 90 tablet 1  . sertraline (ZOLOFT) 100 MG tablet TAKE ONE TABLET BY MOUTH DAILY 90 tablet 0  . atorvastatin (LIPITOR) 80 MG tablet TAKE 1/2 TO 1 TABLET DAILY OR AS DIRECTED FOR CHOLESTEROL (Patient not taking: Reported on 04/09/2018) 90 tablet 1   No current facility-administered medications on file prior to visit.     Current Problems (verified) Patient Active Problem List   Diagnosis Date Noted  . Pulmonary emphysema ([place]) 06/13/2017  . Atherosclerosis of aorta ([place]) 06/13/2017  . Psoriasis 05/25/2016  . Hereditary and idiopathic peripheral neuropathy 05/25/2016  . Encounter for Medicare annual wellness exam 04/27/2015  . Major depression in full remission ([place]) 11/27/2014  . Insomnia 11/27/2014  . Cervical arthritis ([place]) 10/10/2014  . Medication management 10/02/2014  . Mixed hyperlipidemia 07/01/2014  . Hypertension   . Other abnormal glucose   . Vitamin D deficiency   . Mitral valve disorder 06/16/2009  . Diverticulitis of colon 05/06/2008    Screening Tests Immunization History  Administered Date(s) Administered  . DT 04/27/2015  . Pneumococcal Conjugate-13 04/27/2015  . Pneumococcal Polysaccharide-23  12/22/2004, 11/24/2016  . Rabies, IM 05/04/2016, 05/07/2016  . Td 12/16/2003   Preventative care: Last colonoscopy: 12/2016, 5 year follow up EGD 02/2015 Last mammogram: 08/2015, CAT B DUE pt will schedule Last pap smear/pelvic exam: 11/2012 , 1 abnormal in her 20's, declines another DEXA: at OB/GYN-08/2015 normal Echo 2009 normal EF Ct AB 11/2014 Ct chest: 2018 CXR: 2018  Prior vaccinations: TD or Tdap: 2016 Influenza: declines Pneumococcal: 2018 Prevnar 13: 2016 Shingles/Zostavax: declines  Names of Other Physician/Practitioners you currently use: 1. [facility]- here for primary care 2. Dr. Sabra Heck, eye doctor, last visit 2019 3. Dr. Rona Ravens, dentist, last visit 2019, q98m 4. Dr. Renda Rolls, derm, last visit 03/2017, goes annually  Patient Care Team: Unk Pinto, MD as PCP - General (Internal Medicine) Justice Britain, MD as Consulting Physician (Orthopedic Surgery) Iran Planas, MD as Consulting Physician (Orthopedic Surgery) Thornell Sartorius, MD as Consulting Physician (Otolaryngology) Josue Hector, MD as Consulting Physician (Cardiology) Rana Snare, MD as Consulting Physician (Urology) Fontaine, Belinda Block, MD as Consulting Physician (Gynecology) [facility], Jennefer Bravo, MD as Referring Physician (Dermatology) Marica Otter,  OD (Optometry) Merrilee Jansky , DDS   Allergies Allergies  Allergen Reactions  . Bactrim [Sulfamethoxazole-Trimethoprim] Swelling    Face and eyes swollen and red.  Marland Kitchen Ketek [Telithromycin]     Unknown    SURGICAL HISTORY She  has a past surgical history that includes Tympanoplasty (Right); Rotator cuff repair (Left); Ectopic pregnancy surgery (1971, 1979); Hemorrhoid surgery; Oophorectomy; Gynecologic cryosurgery (1976); Abdominal hysterectomy (2000); Rotator cuff repair (Right, 2/15); Colonoscopy; Esophagogastroduodenoscopy; Upper gastrointestinal endoscopy; Ganglion cyst excision (Right); and Finger  surgery. FAMILY HISTORY Her family history includes Cirrhosis in her father; Colon cancer in her maternal grandmother; Colon polyps in her sister; Diabetes in her father and mother; Heart disease in her father, maternal grandfather, paternal grandfather, and paternal grandmother; Hyperlipidemia in her father; Hypertension in her father and mother; Lymphoma in her maternal grandmother; Melanoma in her mother; Stomach cancer in her maternal grandmother; Uterine cancer in her sister. SOCIAL HISTORY She  reports that she has been smoking cigarettes. She has been smoking about 0.00 packs per day for the past 30.00 years. She has never used smokeless tobacco. She reports that she drinks alcohol. She reports that she does not use drugs.  MEDICARE WELLNESS OBJECTIVES: Physical activity:   Cardiac risk factors:   Depression/mood screen:   Depression screen [facility] 2/9 04/09/2018  Decreased Interest 0  Down, Depressed, Hopeless 0  PHQ - 2 Score 0  Altered sleeping -  Tired, decreased energy -  Change in appetite -  Feeling bad or failure about yourself  -  Trouble concentrating -  Moving slowly or fidgety/restless -  Suicidal thoughts -  PHQ-9 Score -  Difficult doing work/chores -    ADLs:  In your present state of health, do you have any difficulty performing the following activities: 12/12/2017  Hearing? N  Vision? N  Difficulty concentrating or making decisions? N  Walking or climbing stairs? N  Dressing or bathing? N  Doing errands, shopping? N  Some recent data might be hidden    Cognitive Testing  Alert? Yes  Normal Appearance?Yes  Oriented to person? Yes  Place? Yes   Time? Yes  Recall of three objects?  Yes  Can perform simple calculations? Yes  Displays appropriate judgment?Yes  Can read the correct time from a watch face?Yes  EOL planning: Does Patient Have a Medical Advance Directive?: Yes Type of Advance Directive: Healthcare Power of Attorney, Living will Does patient want  to make changes to medical advance directive?: No - Patient declined Copy of [place] in Chart?: No - copy requested Would patient like information on creating a medical advance directive?: No - Patient declined   Objective:   Blood pressure 108/68, pulse 63, temperature 97.7 F (36.5 C), height 5\' 4"  (1.626 m), weight 146 lb (66.2 kg), SpO2 96 %. Body mass index is 25.06 kg/m.  General appearance: alert, no distress, WD/WN,  female HEENT: normocephalic, sclerae anicteric, TMs pearly, nares patent, no discharge or erythema, pharynx normal, right inner nostril at septum with small tender nodule, non fluctuant  Oral cavity: MMM, no lesions Neck: supple, no lymphadenopathy, no thyromegaly, no masses Heart: RRR, bradycardia, slightly holosystolic murmur, normal S1, S2 Lungs: CTA bilaterally, no wheezes, rhonchi, or rales Abdomen: +bs, soft, non tender, no hepatomegaly, no splenomegaly Musculoskeletal: nontender, no swelling, no obvious deformity Extremities: no edema, no cyanosis, no clubbing Pulses: 2+ symmetric, upper and lower extremities, normal cap refill Neurological: alert, oriented x 3, CN2-12 intact, strength normal upper extremities and lower extremities,  sensation normal throughout, DTRs 2+ throughout, no cerebellar signs, gait normal Psychiatric: normal affect, behavior normal, pleasant  Breast: defer CYO:[id number] Rectal: defer  Medicare Attestation I have personally reviewed: The patient's medical and social history Their use of alcohol, tobacco or illicit drugs Their current medications and supplements The patient's functional ability including ADLs,fall risks, home safety risks, cognitive, and hearing and visual impairment Diet and physical activities Evidence for depression or mood disorders  The patient's weight, height, BMI, and visual acuity have been recorded in the chart.  I have made referrals, counseling, and provided education to the patient  based on review of the above and I have provided the patient with a written personalized care plan for preventive services.     Izora Ribas, NP   04/09/2018

## 2018-04-09 ENCOUNTER — Encounter: Payer: Self-pay | Admitting: Adult Health

## 2018-04-09 ENCOUNTER — Ambulatory Visit (INDEPENDENT_AMBULATORY_CARE_PROVIDER_SITE_OTHER): Payer: PPO | Admitting: Adult Health

## 2018-04-09 VITALS — BP 108/68 | HR 63 | Temp 97.7°F | Ht 64.0 in | Wt 146.0 lb

## 2018-04-09 DIAGNOSIS — G609 Hereditary and idiopathic neuropathy, unspecified: Secondary | ICD-10-CM | POA: Diagnosis not present

## 2018-04-09 DIAGNOSIS — R7309 Other abnormal glucose: Secondary | ICD-10-CM | POA: Diagnosis not present

## 2018-04-09 DIAGNOSIS — Z79899 Other long term (current) drug therapy: Secondary | ICD-10-CM | POA: Diagnosis not present

## 2018-04-09 DIAGNOSIS — F3342 Major depressive disorder, recurrent, in full remission: Secondary | ICD-10-CM

## 2018-04-09 DIAGNOSIS — E559 Vitamin D deficiency, unspecified: Secondary | ICD-10-CM

## 2018-04-09 DIAGNOSIS — Z6825 Body mass index (BMI) 25.0-25.9, adult: Secondary | ICD-10-CM

## 2018-04-09 DIAGNOSIS — K5732 Diverticulitis of large intestine without perforation or abscess without bleeding: Secondary | ICD-10-CM | POA: Diagnosis not present

## 2018-04-09 DIAGNOSIS — I10 Essential (primary) hypertension: Secondary | ICD-10-CM

## 2018-04-09 DIAGNOSIS — I700 Atherosclerosis of aorta: Secondary | ICD-10-CM

## 2018-04-09 DIAGNOSIS — Z0000 Encounter for general adult medical examination without abnormal findings: Secondary | ICD-10-CM | POA: Diagnosis not present

## 2018-04-09 DIAGNOSIS — J439 Emphysema, unspecified: Secondary | ICD-10-CM

## 2018-04-09 DIAGNOSIS — L409 Psoriasis, unspecified: Secondary | ICD-10-CM | POA: Diagnosis not present

## 2018-04-09 DIAGNOSIS — I059 Rheumatic mitral valve disease, unspecified: Secondary | ICD-10-CM

## 2018-04-09 DIAGNOSIS — M47812 Spondylosis without myelopathy or radiculopathy, cervical region: Secondary | ICD-10-CM | POA: Diagnosis not present

## 2018-04-09 DIAGNOSIS — G4700 Insomnia, unspecified: Secondary | ICD-10-CM

## 2018-04-09 DIAGNOSIS — E782 Mixed hyperlipidemia: Secondary | ICD-10-CM | POA: Diagnosis not present

## 2018-04-09 NOTE — Patient Instructions (Addendum)
[facility]  7 a.m.-6:30 p.m., Monday 7 a.m.-5 p.m., Tuesday-Friday Schedule an appointment by calling [phone].  [facility] Schedule an appointment by calling [phone].   Schedule mammogram Stop smoking Ask pharmacist about cholesterol medication that would be round/smaller and call    Can try  benadryl 25-50mg  at night for sleep.  If this does not help we can try other prescription medication.  Also here is some information about good sleep hygiene.   Insomnia Insomnia is frequent trouble falling and/or staying asleep. Insomnia can be a long term problem or a short term problem. Both are common. Insomnia can be a short term problem when the wakefulness is related to a certain stress or worry. Long term insomnia is often related to ongoing stress during waking hours and/or poor sleeping habits. Overtime, sleep deprivation itself can make the problem worse. Every little thing feels more severe because you are overtired and your ability to cope is decreased. CAUSES   Stress, anxiety, and depression.  Poor sleeping habits.  Distractions such as TV in the bedroom.  Naps close to bedtime.  Engaging in emotionally charged conversations before bed.  Technical reading before sleep.  Alcohol and other sedatives. They may make the problem worse. They can hurt normal sleep patterns and normal dream activity.  Stimulants such as caffeine for several hours prior to bedtime.  Pain syndromes and shortness of breath can cause insomnia.  Exercise late at night.  Changing time zones may cause sleeping problems (jet lag). It is sometimes helpful to have someone observe your sleeping patterns. They should look for periods of not breathing during the night (sleep apnea). They should also look to see how long those periods last. If you live alone or observers are uncertain, you can also be observed at a sleep clinic where your sleep patterns will be  professionally monitored. Sleep apnea requires a checkup and treatment. Give your caregivers your medical history. Give your caregivers observations your family has made about your sleep.  SYMPTOMS   Not feeling rested in the morning.  Anxiety and restlessness at bedtime.  Difficulty falling and staying asleep. TREATMENT   Your caregiver may prescribe treatment for an underlying medical disorders. Your caregiver can give advice or help if you are using alcohol or other drugs for self-medication. Treatment of underlying problems will usually eliminate insomnia problems.  Medications can be prescribed for short time use. They are generally not recommended for lengthy use.  Over-the-counter sleep medicines are not recommended for lengthy use. They can be habit forming.  You can promote easier sleeping by making lifestyle changes such as:  Using relaxation techniques that help with breathing and reduce muscle tension.  Exercising earlier in the day.  Changing your diet and the time of your last meal. No night time snacks.  Establish a regular time to go to bed.  Counseling can help with stressful problems and worry.  Soothing music and white noise may be helpful if there are background noises you cannot remove.  Stop tedious detailed work at least one hour before bedtime. HOME CARE INSTRUCTIONS   Keep a diary. Inform your caregiver about your progress. This includes any medication side effects. See your caregiver regularly. Take note of:  Times when you are asleep.  Times when you are awake during the night.  The quality of your sleep.  How you feel the next day. This information will help your caregiver care for you.  Get out of bed  if you are still awake after 15 minutes. Read or do some quiet activity. Keep the lights down. Wait until you feel sleepy and go back to bed.  Keep regular sleeping and waking hours. Avoid naps.  Exercise regularly.  Avoid distractions at  bedtime. Distractions include watching television or engaging in any intense or detailed activity like attempting to balance the household checkbook.  Develop a bedtime ritual. Keep a familiar routine of bathing, brushing your teeth, climbing into bed at the same time each night, listening to soothing music. Routines increase the success of falling to sleep faster.  Use relaxation techniques. This can be using breathing and muscle tension release routines. It can also include visualizing peaceful scenes. You can also help control troubling or intruding thoughts by keeping your mind occupied with boring or repetitive thoughts like the old concept of counting sheep. You can make it more creative like imagining planting one beautiful flower after another in your backyard garden.  During your day, work to eliminate stress. When this is not possible use some of the previous suggestions to help reduce the anxiety that accompanies stressful situations. MAKE SURE YOU:   Understand these instructions.  Will watch your condition.  Will get help right away if you are not doing well or get worse. Document Released: 08/12/2000 Document Revised: 11/07/2011 Document Reviewed: 09/12/2007 [facility] Patient Information 2015 [place], [place]. This information is not intended to replace advice given to you by your health care provider. Make sure you discuss any questions you have with your health care provider.       When it comes to diets, agreement about the perfect plan isn't easy to find, even among the experts. Experts at the [employer] developed an idea known as the Healthy Eating Plate. Just imagine a plate divided into logical, healthy portions.  The emphasis is on diet quality:  Load up on vegetables and fruits - one-half of your plate: Aim for color and variety, and remember that potatoes don't count.  Go for whole grains - one-quarter of your plate: Whole wheat, barley, wheat  berries, quinoa, oats, brown rice, and foods made with them. If you want pasta, go with whole wheat pasta.  Protein power - one-quarter of your plate: Fish, chicken, beans, and nuts are all healthy, versatile protein sources. Limit red meat.  The diet, however, does go beyond the plate, offering a few other suggestions.  Use healthy plant oils, such as olive, canola, soy, corn, sunflower and peanut. Check the labels, and avoid partially hydrogenated oil, which have unhealthy trans fats.  If you're thirsty, drink water. Coffee and tea are good in moderation, but skip sugary drinks and limit milk and dairy products to one or two daily servings.  The type of carbohydrate in the diet is more important than the amount. Some sources of carbohydrates, such as vegetables, fruits, whole grains, and beans-are healthier than others.  Finally, stay active.

## 2018-04-10 ENCOUNTER — Other Ambulatory Visit: Payer: Self-pay | Admitting: Adult Health

## 2018-04-10 ENCOUNTER — Telehealth: Payer: Self-pay | Admitting: Internal Medicine

## 2018-04-10 LAB — COMPLETE METABOLIC PANEL WITH GFR
AG Ratio: 1.6 (calc) (ref 1.0–2.5)
ALBUMIN MSPROF: 4.2 g/dL (ref 3.6–5.1)
ALT: 10 U/L (ref 6–29)
AST: 13 U/L (ref 10–35)
Alkaline phosphatase (APISO): 65 U/L (ref 33–130)
BUN: 15 mg/dL (ref 7–25)
CALCIUM: 9.3 mg/dL (ref 8.6–10.4)
CO2: 26 mmol/L (ref 20–32)
CREATININE: 0.81 mg/dL (ref 0.60–0.93)
Chloride: 106 mmol/L (ref 98–110)
GFR, EST AFRICAN AMERICAN: 85 mL/min/{1.73_m2} (ref >=60)
GFR, EST NON AFRICAN AMERICAN: 74 mL/min/{1.73_m2} (ref >=60)
GLOBULIN: 2.6 g/dL (ref 1.9–3.7)
Glucose, Bld: 91 mg/dL (ref 65–99)
Potassium: 4.7 mmol/L (ref 3.5–5.3)
SODIUM: 139 mmol/L (ref 135–146)
TOTAL PROTEIN: 6.8 g/dL (ref 6.1–8.1)
Total Bilirubin: 0.4 mg/dL (ref 0.2–1.2)

## 2018-04-10 LAB — HEMOGLOBIN A1C
HEMOGLOBIN A1C: 5.7 %{Hb} — AB (ref <5.7)
Mean Plasma Glucose: 117 (calc)
eAG (mmol/L): 6.5 (calc)

## 2018-04-10 LAB — LIPID PANEL
CHOL/HDL RATIO: 3.1 (calc) (ref <5.0)
CHOLESTEROL: 248 mg/dL — AB (ref <200)
HDL: 79 mg/dL (ref >50)
LDL CHOLESTEROL (CALC): 146 mg/dL — AB
Non-HDL Cholesterol (Calc): 169 mg/dL (calc) — ABNORMAL HIGH (ref <130)
Triglycerides: 117 mg/dL (ref <150)

## 2018-04-10 LAB — CBC WITH DIFFERENTIAL/PLATELET
BASOS PCT: 0.7 %
Basophils Absolute: 53 cells/uL (ref 0–200)
EOS PCT: 2.9 %
Eosinophils Absolute: 220 cells/uL (ref 15–500)
HCT: 35.3 % (ref 35.0–45.0)
HEMOGLOBIN: 12 g/dL (ref 11.7–15.5)
LYMPHS ABS: 2554 {cells}/uL (ref 850–3900)
MCH: 30.8 pg (ref 27.0–33.0)
MCHC: 34 g/dL (ref 32.0–36.0)
MCV: 90.5 fL (ref 80.0–100.0)
MPV: 9.1 fL (ref 7.5–12.5)
Monocytes Relative: 5.8 %
NEUTROS ABS: 4332 {cells}/uL (ref 1500–7800)
Neutrophils Relative %: 57 %
Platelets: 299 10*3/uL (ref 140–400)
RBC: 3.9 10*6/uL (ref 3.80–5.10)
RDW: 13.6 % (ref 11.0–15.0)
Total Lymphocyte: 33.6 %
WBC: 7.6 10*3/uL (ref 3.8–10.8)
WBCMIX: 441 {cells}/uL (ref 200–950)

## 2018-04-10 LAB — MAGNESIUM: Magnesium: 2.1 mg/dL (ref 1.5–2.5)

## 2018-04-10 LAB — TSH: TSH: 1.61 mIU/L (ref 0.40–4.50)

## 2018-04-10 MED ORDER — ATORVASTATIN CALCIUM 40 MG PO TABS: 40.0000 mg | ORAL_TABLET | ORAL | 1 refills | Status: DC

## 2018-04-10 NOTE — Progress Notes (Signed)
Patient was unable to swallow atorvastatin 80 mg tabs even cut in half due to large size; discussed with her pharmacist; 40 mg tabs are small round pills. Will send these in alternately, can take 40 mg three times weekly as she had been taking and titrate as indicated/tolerated.

## 2018-04-10 NOTE — Telephone Encounter (Signed)
Patient called to advise she called pharmacy: Per pharmacy, the Atorvastatin 40mg  is smaller. than the 80mg . Please send new rx with new dosing for the Atorvastatin 40mg  to [facility]. Patient said she can tolerate the smaller pill.

## 2018-04-26 DIAGNOSIS — H52222 Regular astigmatism, left eye: Secondary | ICD-10-CM | POA: Diagnosis not present

## 2018-04-26 DIAGNOSIS — H25813 Combined forms of age-related cataract, bilateral: Secondary | ICD-10-CM | POA: Diagnosis not present

## 2018-04-26 DIAGNOSIS — H5203 Hypermetropia, bilateral: Secondary | ICD-10-CM | POA: Diagnosis not present

## 2018-05-02 DIAGNOSIS — Z85828 Personal history of other malignant neoplasm of skin: Secondary | ICD-10-CM | POA: Diagnosis not present

## 2018-05-02 DIAGNOSIS — L400 Psoriasis vulgaris: Secondary | ICD-10-CM | POA: Diagnosis not present

## 2018-05-02 DIAGNOSIS — D225 Melanocytic nevi of trunk: Secondary | ICD-10-CM | POA: Diagnosis not present

## 2018-05-02 DIAGNOSIS — L814 Other melanin hyperpigmentation: Secondary | ICD-10-CM | POA: Diagnosis not present

## 2018-05-02 DIAGNOSIS — L821 Other seborrheic keratosis: Secondary | ICD-10-CM | POA: Diagnosis not present

## 2018-05-02 DIAGNOSIS — L219 Seborrheic dermatitis, unspecified: Secondary | ICD-10-CM | POA: Diagnosis not present

## 2018-05-07 ENCOUNTER — Other Ambulatory Visit: Payer: Self-pay | Admitting: Adult Health

## 2018-05-22 ENCOUNTER — Other Ambulatory Visit: Payer: Self-pay | Admitting: Cardiovascular Disease

## 2018-06-18 ENCOUNTER — Encounter: Payer: Self-pay | Admitting: Physician Assistant

## 2018-06-27 DIAGNOSIS — Z1231 Encounter for screening mammogram for malignant neoplasm of breast: Secondary | ICD-10-CM | POA: Diagnosis not present

## 2018-06-27 LAB — HM MAMMOGRAPHY

## 2018-07-03 ENCOUNTER — Encounter: Payer: Self-pay | Admitting: *Deleted

## 2018-07-10 ENCOUNTER — Other Ambulatory Visit: Payer: Self-pay | Admitting: Adult Health

## 2018-07-18 NOTE — Progress Notes (Addendum)
Complete Physical  Assessment and Plan:  Essential hypertension - continue medications, DASH diet, exercise and monitor at home. Call if greater than 130/80.  - CBC with Differential/Platelet - BASIC METABOLIC PANEL WITH GFR - Hepatic function panel - TSH - Urinalysis, Routine w reflex microscopic (not at [facility]) - Microalbumin / creatinine urine ratio - EKG 12-Lead   Prediabetes Discussed general issues about diabetes pathophysiology and management., Educational material distributed., Suggested low cholesterol diet., Encouraged aerobic exercise., Discussed foot care., Reminded to get yearly retinal exam. - Hemoglobin A1c  Mixed hyperlipidemia -continue medications, check lipids, decrease fatty foods, increase activity.  - Lipid panel  Vitamin D deficiency - Vit D  25 hydroxy (rtn osteoporosis monitoring)   Medication management - Magnesium   Mitral valve disorder No symptoms at this time.   Diverticulitis of colon Monitor, increase fiber  Cervical arthritis ([place]) Exercises, RICE  Insomnia good sleep hygiene discussed, increase day time activity, continue medication  Depression, partial remission Continue zoloft, suggest counseling, stress management techniques discussed, increase water, good sleep hygiene discussed, increase exercise, and increase veggies.    Encounter for general adult medical examination with abnormal findings  Psoriasis Follows with derm, on cream  Neuropathy Hands and feet, follows neuro  Pulmonary emphysema, unspecified emphysema type ([place]) -     umeclidinium-vilanterol (ANORO ELLIPTA) 62.5-25 MCG/INH AEPB; Inhale 1 puff into the lungs daily. Make sure you rinse your mouth after each use Advised to stop smoking, will get CXR, continue meds.   Atherosclerosis of aorta (HCC) Control blood pressure, cholesterol, glucose, increase exercise.    Discussed med's effects and SE's. Screening labs and tests as requested with regular follow-up as  recommended. Over 40 minutes of exam, counseling, chart review, and complex, high level critical decision making was performed this visit.  Future Appointments  Date Time Provider [place]  08/16/2018 11:00 AM Josue Hector, MD [facility]  04/15/2019  2:00 PM Liane Comber, NP [facility] None    HPI  72 y.o. MW female  presents for a complete physical, husband with MS.   Her blood pressure has been controlled at home, today their BP is BP: 130/76 She does workout. She denies chest pain, shortness of breath, dizziness.  She has had non productive cough, has history of COPD, some wheezing but no SOB, CP, fever, chills. She has anoro and is not doing it daily.  She is on cholesterol medication, she is on 1 pill T,T,S and denies myalgias. Her cholesterol is at goal. The cholesterol last visit was:   Lab Results  Component Value Date   CHOL 248 (H) 04/09/2018   HDL 79 04/09/2018   LDLCALC 146 (H) 04/09/2018   TRIG 117 04/09/2018   CHOLHDL 3.1 04/09/2018   She has been working on diet and exercise for prediabetes,  and denies paresthesia of the feet, polydipsia, polyuria and visual disturbances. Last A1C in the office was:  Lab Results  Component Value Date   HGBA1C 5.7 (H) 04/09/2018   Patient is on Vitamin D supplement.   Lab Results  Component Value Date   VD25OH 55 12/12/2017     She is on ativan for sleep and zoloft for depression, doing well. Tried trazadone but could not take the pill.  She had EGD in  July with Dr. Deatra Ina, had dilitation of stricture.  BMI is Body mass index is 24.03 kg/m., she is working on diet and exercise. Wt Readings from Last 3 Encounters:  07/19/18 140 lb (63.5 kg)  04/09/18 146 lb (66.2 kg)  12/12/17 146 lb 12.8 oz (66.6 kg)     Current Medications:  Current Outpatient Medications on File Prior to Visit  Medication Sig Dispense Refill  . atorvastatin (LIPITOR) 40 MG tablet Take 1 tablet (40 mg total) by mouth 3  (three) times a week. Take in evening for cholesterol. 90 tablet 1  . B Complex Vitamins (B COMPLEX PO) Take 1 tablet by mouth every other day.     . Cholecalciferol (VITAMIN D3) 5000 UNITS TABS Take 5,000 Units by mouth 2 (two) times daily.     Marland Kitchen LORazepam (ATIVAN) 1 MG tablet Take 1/2 to 1 tablet at hour of sleep ONLY if needed (Rx to last 90+ days - til Feb 10th) 90 tablet 0  . Magnesium 250 MG TABS Take 250 mg by mouth daily.     . metoprolol tartrate (LOPRESSOR) 25 MG tablet TAKE ONE TABLET BY MOUTH DAILY 90 tablet 0  . sertraline (ZOLOFT) 100 MG tablet TAKE ONE TABLET BY MOUTH DAILY 90 tablet 0   No current facility-administered medications on file prior to visit.    Health Maintenance:   Immunization History  Administered Date(s) Administered  . DT 04/27/2015  . Pneumococcal Conjugate-13 04/27/2015  . Pneumococcal Polysaccharide-23 12/22/2004, 11/24/2016  . Rabies, IM 05/04/2016, 05/07/2016  . Td 12/16/2003   Last colonoscopy: 2018 EGD 02/2015 Flex sig 2011 Dr. Deatra Ina Last mammogram: 08/2017, CAT B due 2 years Last pap smear/pelvic exam: 11/2012 , 1 abnormal in her 20's DEXA: at OB/GYN-08/2015 Echo 2009 normal EF Ct AB 11/2014 Ct lung 11/2016  Prior vaccinations: TD or Tdap: 2016 Influenza: declines Pneumococcal: 2018 Prevnar 13: 2016 Shingles/Zostavax: declines  Last Dental Exam: Dr. Yaakov Guthrie, q 4 months Last Eye Exam: Dr. Sabra Heck, Feb 2016 Patient Care Team: Unk Pinto, MD as PCP - General (Internal Medicine) Justice Britain, MD as Consulting Physician (Orthopedic Surgery) Iran Planas, MD as Consulting Physician (Orthopedic Surgery) Thornell Sartorius, MD as Consulting Physician (Otolaryngology) Josue Hector, MD as Consulting Physician (Cardiology) Rana Snare, MD as Consulting Physician (Urology) Phineas Real, Belinda Block, MD as Consulting Physician (Gynecology) [place], Jennefer Bravo, MD as Referring Physician (Dermatology) Marica Otter, [place]  (Optometry) Merrilee Jansky , DDS  Medical History:  Past Medical History:  Diagnosis Date  . Anemia   . Anxiety   . Arthritis    hands and spine  . Blood transfusion without reported diagnosis   . Cervical dysplasia 1976  . Diverticulitis of colon (without mention of hemorrhage)(562.11)   . Diverticulosis of colon (without mention of hemorrhage)   . Elevated cholesterol   . GERD (gastroesophageal reflux disease)   . Heart murmur   . Hypertension    pt states she takes med for tachycardia NOT HTN -metoprolol taken for tachycardia  . Internal hemorrhoids without mention of complication   . Mitral valve disorders(424.0)   . MRSA infection   . Osteoporosis    spine  . Palpitations   . Pelvic adhesive disease   . Plaque psoriasis   . Prediabetes   . Tachycardia    never had HTN  . Vitamin D deficiency    Allergies Allergies  Allergen Reactions  . Bactrim [Sulfamethoxazole-Trimethoprim] Swelling    Face and eyes swollen and red.  Marland Kitchen Ketek [Telithromycin]     Unknown    SURGICAL HISTORY She  has a past surgical history that includes Tympanoplasty (Right); Rotator cuff repair (Left); Ectopic pregnancy surgery (1971, 1979); Hemorrhoid surgery; Oophorectomy; Gynecologic cryosurgery (1976); Abdominal  hysterectomy (2000); Rotator cuff repair (Right, 2/15); Colonoscopy; Esophagogastroduodenoscopy; Upper gastrointestinal endoscopy; Ganglion cyst excision (Right); and Finger surgery. FAMILY HISTORY Her family history includes Cirrhosis in her father; Colon cancer in her maternal grandmother; Colon polyps in her sister; Diabetes in her father and mother; Heart disease in her father, maternal grandfather, paternal grandfather, and paternal grandmother; Hyperlipidemia in her father; Hypertension in her father and mother; Lymphoma in her maternal grandmother; Melanoma in her mother; Stomach cancer in her maternal grandmother; Uterine cancer in her sister. SOCIAL HISTORY She  reports that she  has been smoking cigarettes. She has been smoking about 0.00 packs per day for the past 30.00 years. She has never used smokeless tobacco. She reports that she drinks alcohol. She reports that she does not use drugs.  Review of Systems: Review of Systems  Constitutional: Negative.   HENT: Negative.   Eyes: Negative.   Respiratory: Negative.  Negative for shortness of breath.   Cardiovascular: Negative.  Negative for chest pain and palpitations.  Gastrointestinal: Negative.   Genitourinary: Negative.   Musculoskeletal: Negative.  Negative for falls.  Skin: Negative.   Neurological: Negative.   Psychiatric/Behavioral: Negative for depression, hallucinations, memory loss, substance abuse and suicidal ideas. The patient is not nervous/anxious and does not have insomnia.     Physical Exam: Estimated body mass index is 24.03 kg/m as calculated from the following:   Height as of this encounter: 5\' 4"  (1.626 m).   Weight as of this encounter: 140 lb (63.5 kg). BP 130/76   Pulse 76   Temp (!) 97.5 F (36.4 C)   Resp 16   Ht 5\' 4"  (1.626 m)   Wt 140 lb (63.5 kg)   BMI 24.03 kg/m  General Appearance: Well nourished, in no apparent distress.  Eyes: PERRLA, EOMs, conjunctiva no swelling or erythema, normal fundi and vessels.  Sinuses: No Frontal/maxillary tenderness  ENT/Mouth: Ext aud canals clear, normal light reflex with TMs without erythema, bulging. Good dentition. No erythema, swelling, or exudate on post pharynx. Tonsils not swollen or erythematous. Hearing normal.  Neck: Supple, thyroid normal. No bruits  Respiratory: Respiratory effort normal, BS equal bilaterally without rales, rhonchi, wheezing or stridor.  Cardio: RRR without murmurs, rubs or gallops. Brisk peripheral pulses without edema.  Chest: symmetric, with normal excursions and percussion.  Breasts: defer Abdomen: Soft, nontender, no guarding, rebound, hernias, masses, or organomegaly.  Lymphatics: Non tender without  lymphadenopathy.  Genitourinary: defer Musculoskeletal: Full ROM all peripheral extremities,5/5 strength, and normal gait.  Skin: Warm, dry without rashes, lesions, ecchymosis. Neuro: Cranial nerves intact, reflexes equal bilaterally. Normal muscle tone, no cerebellar symptoms. Sensation intact.  Psych: Awake and oriented X 3, normal affect, Insight and Judgment appropriate.   EKG: WNL no ST changes. AORTA SCAN: defer  Vicie Mutters 2:13 PM [facility]

## 2018-07-19 ENCOUNTER — Ambulatory Visit (INDEPENDENT_AMBULATORY_CARE_PROVIDER_SITE_OTHER): Payer: PPO | Admitting: Physician Assistant

## 2018-07-19 VITALS — BP 130/76 | HR 76 | Temp 97.5°F | Resp 16 | Ht 64.0 in | Wt 140.0 lb

## 2018-07-19 DIAGNOSIS — J439 Emphysema, unspecified: Secondary | ICD-10-CM

## 2018-07-19 DIAGNOSIS — R7309 Other abnormal glucose: Secondary | ICD-10-CM

## 2018-07-19 DIAGNOSIS — M47812 Spondylosis without myelopathy or radiculopathy, cervical region: Secondary | ICD-10-CM

## 2018-07-19 DIAGNOSIS — K5732 Diverticulitis of large intestine without perforation or abscess without bleeding: Secondary | ICD-10-CM

## 2018-07-19 DIAGNOSIS — E559 Vitamin D deficiency, unspecified: Secondary | ICD-10-CM

## 2018-07-19 DIAGNOSIS — G609 Hereditary and idiopathic neuropathy, unspecified: Secondary | ICD-10-CM

## 2018-07-19 DIAGNOSIS — Z0000 Encounter for general adult medical examination without abnormal findings: Secondary | ICD-10-CM

## 2018-07-19 DIAGNOSIS — G4700 Insomnia, unspecified: Secondary | ICD-10-CM

## 2018-07-19 DIAGNOSIS — I10 Essential (primary) hypertension: Secondary | ICD-10-CM | POA: Diagnosis not present

## 2018-07-19 DIAGNOSIS — E782 Mixed hyperlipidemia: Secondary | ICD-10-CM | POA: Diagnosis not present

## 2018-07-19 DIAGNOSIS — Z79899 Other long term (current) drug therapy: Secondary | ICD-10-CM

## 2018-07-19 DIAGNOSIS — I700 Atherosclerosis of aorta: Secondary | ICD-10-CM

## 2018-07-19 DIAGNOSIS — L409 Psoriasis, unspecified: Secondary | ICD-10-CM

## 2018-07-19 DIAGNOSIS — Z0001 Encounter for general adult medical examination with abnormal findings: Secondary | ICD-10-CM

## 2018-07-19 DIAGNOSIS — F3342 Major depressive disorder, recurrent, in full remission: Secondary | ICD-10-CM

## 2018-07-19 DIAGNOSIS — I059 Rheumatic mitral valve disease, unspecified: Secondary | ICD-10-CM

## 2018-07-19 MED ORDER — SERTRALINE HCL 100 MG PO TABS: 100.0000 mg | ORAL_TABLET | Freq: Every day | ORAL | 1 refills | Status: DC

## 2018-07-19 NOTE — Patient Instructions (Addendum)
NEW GUIDELINES FOR BENOZOS  New guidelines suggest the benzodiazepines are best short term, with prolonged use they lead to physical and psychological dependence. In addition, evidence suggest that for insomnia the effectiveness wanes in 4 weeks and the risks out weight their benefits. Use of these agents have been associated with dementia, falls, motor vehicle accidents and physical addiction. Decreasing these medication have been proven to show improvements in cognition, alertness, decrease of falls and daytime sedation.   We will start a slow taper, symptoms of withdrawal include, insomnia, anxiety, irritability, sweating and stomach or intestinal symptoms like diarrhea or nausea.   11 Tips to Follow:  1. No caffeine after 3pm: Avoid beverages with caffeine (soda, tea, energy drinks, etc.) especially after 3pm. 2. Don't go to bed hungry: Have your evening meal at least 3 hrs. before going to sleep. It's fine to have a small bedtime snack such as a glass of milk and a few crackers but don't have a big meal. 3. Have a nightly routine before bed: Plan on "winding down" before you go to sleep. Begin relaxing about 1 hour before you go to bed. Try doing a quiet activity such as listening to calming music, reading a book or meditating. 4. Turn off the TV and ALL electronics including video games, tablets, laptops, etc. 1 hour before sleep, and keep them out of the bedroom. 5. Turn off your cell phone and all notifications (new email and text alerts) or even better, leave your phone outside your room while you sleep. Studies have shown that a part of your brain continues to respond to certain lights and sounds even while you're still asleep. 6. Make your bedroom quiet, dark and cool. If you can't control the noise, try wearing earplugs or using a fan to block out other sounds. 7. Practice relaxation techniques. Try reading a book or meditating or drain your brain by writing a list of what you need to do the  next day. 8. Don't nap unless you feel sick: you'll have a better night's sleep. 9. Don't smoke, or quit if you do. Nicotine, alcohol, and marijuana can all keep you awake. Talk to your health care provider if you need help with substance use. 10. Most importantly, wake up at the same time every day (or within 1 hour of your usual wake up time) EVEN on the weekends. A regular wake up time promotes sleep hygiene and prevents sleep problems. 11. Reduce exposure to bright light in the last three hours of the day before going to sleep. Maintaining good sleep hygiene and having good sleep habits lower your risk of developing sleep problems. Getting better sleep can also improve your concentration and alertness. Try the simple steps in this guide. If you still have trouble getting enough rest, make an appointment with your health care provider.  Your LDL could improve, ideally we want it under a 100.  Your LDL is the bad cholesterol that can lead to heart attack and stroke. To lower your number you can decrease your fatty foods, red meat, cheese, milk and increase fiber like whole grains and veggies. You can also add a fiber supplement like Citracel or Benefiber, these do not cause gas and bloating and are safe to use. Especially if you have a strong family history of heart disease or stroke or you have evidence of plaque on any imaging like a chest xray, we may discuss at your next office visit putting you on a medication to get your number  below 100.

## 2018-07-20 LAB — HEMOGLOBIN A1C
HEMOGLOBIN A1C: 5.5 %{Hb} (ref <5.7)
Mean Plasma Glucose: 111 (calc)
eAG (mmol/L): 6.2 (calc)

## 2018-07-20 LAB — URINALYSIS, ROUTINE W REFLEX MICROSCOPIC
BILIRUBIN URINE: NEGATIVE
Bacteria, UA: NONE SEEN /HPF
GLUCOSE, UA: NEGATIVE
Hyaline Cast: NONE SEEN /LPF
KETONES UR: NEGATIVE
LEUKOCYTES UA: NEGATIVE
NITRITE: NEGATIVE
PROTEIN: NEGATIVE
Specific Gravity, Urine: 1.02 (ref 1.001–1.03)
Squamous Epithelial / LPF: NONE SEEN /HPF (ref <=5)
WBC UA: NONE SEEN /HPF (ref 0–5)
pH: <=5 (ref 5.0–8.0)

## 2018-07-20 LAB — COMPLETE METABOLIC PANEL WITH GFR
AG RATIO: 1.5 (calc) (ref 1.0–2.5)
ALT: 11 U/L (ref 6–29)
AST: 13 U/L (ref 10–35)
Albumin: 4.2 g/dL (ref 3.6–5.1)
Alkaline phosphatase (APISO): 71 U/L (ref 33–130)
BUN: 15 mg/dL (ref 7–25)
CO2: 25 mmol/L (ref 20–32)
Calcium: 9.5 mg/dL (ref 8.6–10.4)
Chloride: 103 mmol/L (ref 98–110)
Creat: 0.74 mg/dL (ref 0.60–0.93)
GFR, EST NON AFRICAN AMERICAN: 82 mL/min/{1.73_m2} (ref >=60)
GFR, Est African American: 94 mL/min/{1.73_m2} (ref >=60)
Globulin: 2.8 g/dL (calc) (ref 1.9–3.7)
Glucose, Bld: 84 mg/dL (ref 65–99)
Potassium: 4.2 mmol/L (ref 3.5–5.3)
Sodium: 137 mmol/L (ref 135–146)
TOTAL PROTEIN: 7 g/dL (ref 6.1–8.1)
Total Bilirubin: 0.6 mg/dL (ref 0.2–1.2)

## 2018-07-20 LAB — LIPID PANEL
CHOLESTEROL: 185 mg/dL (ref <200)
HDL: 78 mg/dL (ref >50)
LDL Cholesterol (Calc): 86 mg/dL (calc)
Non-HDL Cholesterol (Calc): 107 mg/dL (calc) (ref <130)
Total CHOL/HDL Ratio: 2.4 (calc) (ref <5.0)
Triglycerides: 110 mg/dL (ref <150)

## 2018-07-20 LAB — CBC WITH DIFFERENTIAL/PLATELET
BASOS ABS: 43 {cells}/uL (ref 0–200)
Basophils Relative: 0.5 %
Eosinophils Absolute: 198 cells/uL (ref 15–500)
Eosinophils Relative: 2.3 %
HEMATOCRIT: 37.2 % (ref 35.0–45.0)
Hemoglobin: 12.6 g/dL (ref 11.7–15.5)
Lymphs Abs: 2253 cells/uL (ref 850–3900)
MCH: 31.4 pg (ref 27.0–33.0)
MCHC: 33.9 g/dL (ref 32.0–36.0)
MCV: 92.8 fL (ref 80.0–100.0)
MPV: 9.4 fL (ref 7.5–12.5)
Monocytes Relative: 5.4 %
Neutro Abs: 5642 cells/uL (ref 1500–7800)
Neutrophils Relative %: 65.6 %
Platelets: 292 10*3/uL (ref 140–400)
RBC: 4.01 10*6/uL (ref 3.80–5.10)
RDW: 13.3 % (ref 11.0–15.0)
Total Lymphocyte: 26.2 %
WBC: 8.6 10*3/uL (ref 3.8–10.8)
WBCMIX: 464 {cells}/uL (ref 200–950)

## 2018-07-20 LAB — MICROALBUMIN / CREATININE URINE RATIO
Creatinine, Urine: 131 mg/dL (ref 20–275)
Microalb Creat Ratio: 2 mcg/mg creat (ref <30)
Microalb, Ur: 0.3 mg/dL

## 2018-07-20 LAB — TSH: TSH: 1.12 mIU/L (ref 0.40–4.50)

## 2018-07-20 LAB — VITAMIN D 25 HYDROXY (VIT D DEFICIENCY, FRACTURES): Vit D, 25-Hydroxy: 78 ng/mL (ref 30–100)

## 2018-07-20 LAB — MAGNESIUM: Magnesium: 1.9 mg/dL (ref 1.5–2.5)

## 2018-07-31 ENCOUNTER — Encounter: Payer: Self-pay | Admitting: Physician Assistant

## 2018-07-31 DIAGNOSIS — F3341 Major depressive disorder, recurrent, in partial remission: Secondary | ICD-10-CM | POA: Insufficient documentation

## 2018-08-14 NOTE — Progress Notes (Signed)
Patient ID: [name] [name], female   DOB: [birth year], 72 y.o.   MRN: [id number]     [name] [name] is seen today for F/U of palpitatiions, PAC's and  Smoking She denies SSCP, dyspnea, palpitations or syncope. She has no known structural heart disease.  No new issues seeing Dr Ernestine Conrad now Orthopedic issues with cervical foraminal narrowing seeing Ramos. Has had bilateral arm weakness. And right shoulder surgery with Dr Onnie Graham   CXR done 12/15/16 suggested a pulmonary nodule but f/u CT with none only 8 mm left renal cyst  Doing well with no cardiac complaints Mom died earlier in year and she needs new structure to her life    Lab Results  Component Value Date   CHOL 185 07/19/2018   HDL 78 07/19/2018   LDLCALC 86 07/19/2018   TRIG 110 07/19/2018   CHOLHDL 2.4 07/19/2018   Lab Results  Component Value Date   ALT 11 07/19/2018   AST 13 07/19/2018   ALKPHOS 77 03/08/2017   BILITOT 0.6 07/19/2018    ROS: Denies fever, malais, weight loss, blurry vision, decreased visual acuity, cough, sputum, SOB, hemoptysis, pleuritic pain, palpitaitons, heartburn, abdominal pain, melena, lower extremity edema, claudication, or rash.  All other systems reviewed and negative  General: BP 102/62   Pulse (!) 59   Ht 5\' 4"  (1.626 m)   Wt 139 lb 9.6 oz (63.3 kg)   SpO2 98%   BMI 23.96 kg/m  Affect appropriate Healthy:  appears stated age 85: normal Neck supple with no adenopathy JVP normal no bruits no thyromegaly Lungs clear with no wheezing and good diaphragmatic motion Heart:  S1/S2 no murmur, no rub, gallop or click PMI normal Abdomen: benighn, BS positve, no tenderness, no AAA no bruit.  No HSM or HJR Distal pulses intact with no bruits No edema Neuro non-focal Skin warm and dry No muscular weakness    Current Outpatient Medications  Medication Sig Dispense Refill  . atorvastatin (LIPITOR) 40 MG tablet Take 1 tablet (40 mg total) by mouth 3 (three) times a week. Take in evening  for cholesterol. 90 tablet 1  . B Complex Vitamins (B COMPLEX PO) Take 1 tablet by mouth every other day.     . Cholecalciferol (VITAMIN D3) 5000 UNITS TABS Take 5,000 Units by mouth 2 (two) times daily.     Marland Kitchen LORazepam (ATIVAN) 1 MG tablet Take 1/2 to 1 tablet at hour of sleep ONLY if needed (Rx to last 90+ days - til Feb 10th) 90 tablet 0  . Magnesium 250 MG TABS Take 250 mg by mouth daily.     . metoprolol tartrate (LOPRESSOR) 25 MG tablet TAKE ONE TABLET BY MOUTH DAILY 90 tablet 0  . sertraline (ZOLOFT) 100 MG tablet Take 1 tablet (100 mg total) by mouth daily. 90 tablet 1   No current facility-administered medications for this visit.     Allergies  Bactrim [sulfamethoxazole-trimethoprim] and Ketek [telithromycin]  Electrocardiogram:  SR rate 55 normal  02/18/14  Assessment and Plan Palpitations/PAC;s: benign continue beta blocker Elevated Lipids: takes 40 mg lipitor 3x/week Cholesterol is at goal.  Continue current dose of statin and diet Rx.  No myalgias or side effects.  F/U  LFT's in 6 months. Lab Results  Component Value Date   LDLCALC 86 07/19/2018          Cervical Spine: f/u neuro stable GERD: related to smoking and wine continue prilosec Depression: improved on Zoloft Smoking:  Still has occasional cigarettes when she drinks  wine   CT chest 12/22/16 ok no nodules f/u lung cancer Screening CT ordered    Jenkins Rouge

## 2018-08-16 ENCOUNTER — Ambulatory Visit (INDEPENDENT_AMBULATORY_CARE_PROVIDER_SITE_OTHER): Payer: PPO | Admitting: Cardiovascular Disease

## 2018-08-16 ENCOUNTER — Encounter: Payer: Self-pay | Admitting: Cardiovascular Disease

## 2018-08-16 VITALS — BP 102/62 | HR 59 | Ht 64.0 in | Wt 139.6 lb

## 2018-08-16 DIAGNOSIS — F17200 Nicotine dependence, unspecified, uncomplicated: Secondary | ICD-10-CM

## 2018-08-16 DIAGNOSIS — Z122 Encounter for screening for malignant neoplasm of respiratory organs: Secondary | ICD-10-CM

## 2018-08-16 NOTE — Addendum Note (Signed)
Addended by: Rose Phi on: 08/16/2018 04:33 PM   Modules accepted: Orders

## 2018-08-16 NOTE — Patient Instructions (Signed)
Medication Instructions:   If you need a refill on your cardiac medications before your next appointment, please call your pharmacy.   Lab work:  If you have labs (blood work) drawn today and your tests are completely normal, you will receive your results only by: Marland Kitchen MyChart Message (if you have MyChart) OR . A paper copy in the mail If you have any lab test that is abnormal or we need to change your treatment, we will call you to review the results.  Testing/Procedures: Non-Cardiac CT scanning for lung cancer screening, (CAT scanning), is a noninvasive, special x-ray that produces cross-sectional images of the body using x-rays and a computer. CT scans help physicians diagnose and treat medical conditions. For some CT exams, a contrast material is used to enhance visibility in the area of the body being studied. CT scans provide greater clarity and reveal more details than regular x-ray exams.  Follow-Up: At [facility], you and your health needs are our priority.  As part of our continuing mission to provide you with exceptional heart care, we have created designated Provider Care Teams.  These Care Teams include your primary Cardiologist (physician) and Advanced Practice Providers (APPs -  Physician Assistants and Nurse Practitioners) who all work together to provide you with the care you need, when you need it. You will need a follow up appointment in 1 years.  Please call our office 2 months in advance to schedule this appointment.  You may see Jenkins Rouge, MD or one of the following Advanced Practice Providers on your designated Care Team:   Truitt Merle, NP Cecilie Kicks, NP . Kathyrn Drown, NP

## 2018-08-26 DIAGNOSIS — L03113 Cellulitis of right upper limb: Secondary | ICD-10-CM | POA: Diagnosis not present

## 2018-09-07 ENCOUNTER — Ambulatory Visit (INDEPENDENT_AMBULATORY_CARE_PROVIDER_SITE_OTHER)
Admission: RE | Admit: 2018-09-07 | Discharge: 2018-09-07 | Disposition: A | Discharge status: Home / Self Care | Payer: PPO | Source: Ambulatory Visit | Attending: Cardiovascular Disease | Admitting: Cardiovascular Disease

## 2018-09-07 DIAGNOSIS — F17200 Nicotine dependence, unspecified, uncomplicated: Secondary | ICD-10-CM

## 2018-09-07 DIAGNOSIS — F17210 Nicotine dependence, cigarettes, uncomplicated: Secondary | ICD-10-CM | POA: Diagnosis not present

## 2018-09-07 DIAGNOSIS — Z122 Encounter for screening for malignant neoplasm of respiratory organs: Secondary | ICD-10-CM

## 2018-09-10 ENCOUNTER — Telehealth: Payer: Self-pay

## 2018-09-10 DIAGNOSIS — N2889 Other specified disorders of kidney and ureter: Secondary | ICD-10-CM

## 2018-09-10 NOTE — Telephone Encounter (Signed)
Patient aware of results. Put in MRI order, will send message to scheduling to help schedule. Patient use to see Dr. Risa Grill at [facility] urology in the past for renal cancer. Put in referral for patient to see Dr. Risa Grill. Asked patient to follow up with her PCP as well. Patient verbalized understanding.

## 2018-09-10 NOTE — Telephone Encounter (Signed)
Olivia Mackie with Radiology called about patient's lung cancer screening CT. Will forward to Dr. Johnsie Cancel.  IMPRESSION: 1. Lung-RADS 2, benign appearance or behavior. Continue annual screening with low-dose chest CT without contrast in 12 months. 2. Mild growth of indeterminate 1.0 cm renal cortical lesion in the anterior upper left kidney, can not exclude slow growing renal neoplasm. MRI (preferred) or CT abdomen without and with IV contrast recommended for further characterization.

## 2018-09-10 NOTE — Telephone Encounter (Signed)
Order renal MRI and have her f/u with urology or primary

## 2018-09-13 ENCOUNTER — Other Ambulatory Visit (HOSPITAL_COMMUNITY): Payer: Self-pay | Admitting: Cardiovascular Disease

## 2018-09-13 ENCOUNTER — Ambulatory Visit (HOSPITAL_COMMUNITY)
Admission: RE | Admit: 2018-09-13 | Discharge: 2018-09-13 | Disposition: A | Discharge status: Home / Self Care | Payer: PPO | Source: Ambulatory Visit | Attending: Cardiovascular Disease | Admitting: Cardiovascular Disease

## 2018-09-13 DIAGNOSIS — N2889 Other specified disorders of kidney and ureter: Secondary | ICD-10-CM | POA: Insufficient documentation

## 2018-09-13 DIAGNOSIS — N289 Disorder of kidney and ureter, unspecified: Secondary | ICD-10-CM | POA: Diagnosis not present

## 2018-09-13 LAB — CREATININE, SERUM
Creatinine, Ser: 0.95 mg/dL (ref 0.44–1.00)
GFR calc Af Amer: >60 mL/min (ref >60)
GFR calc non Af Amer: >60 mL/min (ref >60)

## 2018-09-13 MED ORDER — GADOBUTROL 1 MMOL/ML IV SOLN
6.0000 mL | Freq: Once | INTRAVENOUS | Status: AC | PRN
  Administered 2018-09-13: 6 mL via INTRAVENOUS

## 2018-09-19 ENCOUNTER — Other Ambulatory Visit: Payer: Self-pay | Admitting: Cardiovascular Disease

## 2018-09-22 DIAGNOSIS — F17200 Nicotine dependence, unspecified, uncomplicated: Secondary | ICD-10-CM | POA: Diagnosis not present

## 2018-09-22 DIAGNOSIS — J069 Acute upper respiratory infection, unspecified: Secondary | ICD-10-CM | POA: Diagnosis not present

## 2018-09-22 DIAGNOSIS — R509 Fever, unspecified: Secondary | ICD-10-CM | POA: Diagnosis not present

## 2018-09-28 DIAGNOSIS — R3121 Asymptomatic microscopic hematuria: Secondary | ICD-10-CM | POA: Diagnosis not present

## 2018-09-28 DIAGNOSIS — D49512 Neoplasm of unspecified behavior of left kidney: Secondary | ICD-10-CM | POA: Diagnosis not present

## 2018-10-08 ENCOUNTER — Other Ambulatory Visit: Payer: Self-pay | Admitting: Internal Medicine

## 2018-10-18 DIAGNOSIS — H524 Presbyopia: Secondary | ICD-10-CM | POA: Diagnosis not present

## 2018-11-02 ENCOUNTER — Ambulatory Visit (INDEPENDENT_AMBULATORY_CARE_PROVIDER_SITE_OTHER): Payer: PPO | Admitting: Adult Health

## 2018-11-02 ENCOUNTER — Encounter: Payer: Self-pay | Admitting: Adult Health

## 2018-11-02 VITALS — BP 120/60 | HR 67 | Temp 97.7°F | Ht 64.0 in | Wt 140.0 lb

## 2018-11-02 DIAGNOSIS — R51 Headache: Secondary | ICD-10-CM | POA: Diagnosis not present

## 2018-11-02 DIAGNOSIS — H5712 Ocular pain, left eye: Secondary | ICD-10-CM

## 2018-11-02 DIAGNOSIS — R21 Rash and other nonspecific skin eruption: Secondary | ICD-10-CM

## 2018-11-02 DIAGNOSIS — B0239 Other herpes zoster eye disease: Secondary | ICD-10-CM | POA: Diagnosis not present

## 2018-11-02 DIAGNOSIS — R519 Headache, unspecified: Secondary | ICD-10-CM

## 2018-11-02 LAB — SEDIMENTATION RATE: Sed Rate: 9 mm/h (ref 0–30)

## 2018-11-02 MED ORDER — VALACYCLOVIR HCL 1 G PO TABS: 1000.0000 mg | ORAL_TABLET | Freq: Three times a day (TID) | ORAL | 0 refills | Status: DC

## 2018-11-02 MED ORDER — PREDNISONE 20 MG PO TABS: ORAL_TABLET | ORAL | 0 refills | Status: AC

## 2018-11-02 NOTE — Patient Instructions (Addendum)
Please schedule to be seen by an eye doctor ASAP  Will start prednisone (can help shingles and temporal arteritis) and antiviral (treats shingles)  Can also try lyrica 1-2 caps three times a day as needed for pain if prednisone is not enough  We will increase the dose of prednisone if needed if labs suggestive of temporal arteritis  I'd like to see you back in 1 week, or sooner if needed to make sure this is resolving appropriately due to potential risk of long lasting complications with both of these conditions    Temporal Arteritis  Temporal arteritis is a condition that causes arteries to become inflamed. It usually affects arteries in your head and face, but arteries in any part of the body can become inflamed. The condition is also called giant cell arteritis.  Temporal arteritis can cause serious problems such as blindness. Early treatment can help prevent these problems. What are the causes? The cause of this condition is not known. What increases the risk? The following factors may make you more likely to develop this condition:  Being older than 50.  Being a woman.  Being Caucasian.  Being of [place], [place], [place], [place] or [place] ancestry.  Having a family history of the condition.  Having a certain condition that causes muscle pain and stiffness (polymyalgia rheumatica, PMR). What are the signs or symptoms? Some people with temporal arteritis have just one symptom, while others have several symptoms. Most symptoms are related to the head and face. These may include:  Headache.  Hard or swollen temples. This is common. Your temples are the areas on either side of your forehead. If your temples are swollen, it may hurt to touch them.  Pain when combing your hair or when laying your head down.  Pain in the jaw when chewing.  Pain in the throat or tongue.  Problems with your vision, such as sudden loss of vision in one eye, or seeing double. Other  symptoms may include:  Fever.  Tiredness (fatigue).  A dry cough.  Pain in the hips and shoulders.  Pain in the arms during exercise.  Depression.  Weight loss. How is this diagnosed? This condition may be diagnosed based on:  Your symptoms.  Your medical history.  A physical exam.  Tests, including: ? Blood tests. ? A test in which a tissue sample is removed from an artery so it can be examined (biopsy). ? Imaging tests, such as an ultrasound or MRI. How is this treated? This condition may be treated with:  A type of medicine to reduce inflammation (corticosteroid).  Medicines to weaken your immune system (immunosuppressants).  Other medicines to treat vision problems. You will need to see your health care provider while you are being treated. The medicines used to treat this condition can increase your risk of problems such as bone loss and diabetes. During follow-up visits, your health care provider will check for problems by:  Doing blood tests and bone density tests.  Checking your blood pressure and blood sugar. Follow these instructions at home: Medicines  Take over-the-counter and prescription medicines only as told by your health care provider.  Take any vitamins or supplements recommended by your health care provider. These may include vitamin D and calcium, which help keep your bones from becoming weak. Eating and drinking   Eat a heart-healthy diet. This may include: ? Eating high-fiber foods, such as fresh fruits and vegetables, whole grains, and beans. ? Eating heart-healthy fats (omega-3 fats), such as  fish, flaxseed, and flaxseed oil. ? Limiting foods that are high in saturated fat and cholesterol, such as processed and fried foods, fatty meat, and full-fat dairy. ? Limiting how much salt (sodium) you eat.  Include calcium and vitamin D in your diet. Good sources of calcium and vitamin D include: ? Low-fat dairy products such as milk, yogurt,  and cheese. ? Certain fish, such as fresh or canned salmon, tuna, and sardines. ? Products that have calcium and vitamin D added to them (fortified products), such as fortified cereals or juice. General instructions  Exercise. Talk with your health care provider about what exercises are okay for you. Exercises that increase your heart rate (aerobic exercise), such as walking, are often recommended. Aerobic exercise helps control your blood pressure and prevent bone loss.  Stay up to date on all vaccines as directed by your health care provider.  Keep all follow-up visits as told by your health care provider. This is important. Contact a health care provider if:  Your symptoms get worse.  You develop signs of infection, such as fever, swelling, redness, warmth, and tenderness. Get help right away if:  You lose your vision.  Your pain does not go away, even after you take medicine.  You have chest pain.  You have trouble breathing.  One side of your face or body suddenly becomes weak or numb. These symptoms may represent a serious problem that is an emergency. Do not wait to see if the symptoms will go away. Get medical help right away. Call your local emergency services (911 in the [place] Do not drive yourself to the hospital. Summary  Temporal arteritis is a condition that causes arteries to become inflamed. It usually affects arteries in your head and face.  This condition can cause serious problems, such as blindness. Treatment can help prevent these problems.  Symptoms may include hard or tender temples, pain in your jaw when chewing, problems with your vision, or pain in your hips and shoulders.  Take over-the-counter and prescription medicines as told by your health care provider. This information is not intended to replace advice given to you by your health care provider. Make sure you discuss any questions you have with your health care provider. Document Released:  06/12/2009 Document Revised: 09/26/2017 Document Reviewed: 09/26/2017 Elsevier Interactive Patient Education  2019 [place], which is also known as herpes zoster, is an infection that causes a painful skin rash and fluid-filled blisters. It is caused by a virus. Shingles only develops in people who:  Have had chickenpox.  Have been given a medicine to protect against chickenpox (have been vaccinated). Shingles is rare in this group. What are the causes? Shingles is caused by varicella-zoster virus (VZV). This is the same virus that causes chickenpox. After a person is exposed to VZV, the virus stays in the body in an inactive (dormant) state. Shingles develops if the virus is reactivated. This can happen many years after the first (initial) exposure to VZV. It is not known what causes this virus to be reactivated. What increases the risk? People who have had chickenpox or received the chickenpox vaccine are at risk for shingles. Shingles infection is more common in people who:  Are older than age 68.  Have a weakened disease-fighting system (immune system), such as people with: ? HIV. ? AIDS. ? Cancer.  Are taking medicines that weaken the immune system, such as transplant medicines.  Are experiencing a lot of stress. What are the  signs or symptoms? Early symptoms of this condition include itching, tingling, and pain in an area on your skin. Pain may be described as burning, stabbing, or throbbing. A few days or weeks after early symptoms start, a painful red rash appears. The rash is usually on one side of the body and has a band-like or belt-like pattern. The rash eventually turns into fluid-filled blisters that break open, change into scabs, and dry up in about 2-3 weeks. At any time during the infection, you may also develop:  A fever.  Chills.  A headache.  An upset stomach. How is this diagnosed? This condition is diagnosed with a skin exam. Skin  or fluid samples may be taken from the blisters before a diagnosis is made. These samples are examined under a microscope or sent to a lab for testing. How is this treated? The rash may last for several weeks. There is not a specific cure for this condition. Your health care provider will probably prescribe medicines to help you manage pain, recover more quickly, and avoid long-term problems. Medicines may include:  Antiviral drugs.  Anti-inflammatory drugs.  Pain medicines.  Anti-itching medicines (antihistamines). If the area involved is on your face, you may be referred to a specialist, such as an eye doctor (ophthalmologist) or an ear, nose, and throat (ENT) doctor (otolaryngologist) to help you avoid eye problems, chronic pain, or disability. Follow these instructions at home: Medicines  Take over-the-counter and prescription medicines only as told by your health care provider.  Apply an anti-itch cream or numbing cream to the affected area as told by your health care provider. Relieving itching and discomfort   Apply cold, wet cloths (cold compresses) to the area of the rash or blisters as told by your health care provider.  Cool baths can be soothing. Try adding baking soda or dry oatmeal to the water to reduce itching. Do not bathe in hot water. Blister and rash care  Keep your rash covered with a loose bandage (dressing). Wear loose-fitting clothing to help ease the pain of material rubbing against the rash.  Keep your rash and blisters clean by washing the area with mild soap and cool water as told by your health care provider.  Check your rash every day for signs of infection. Check for: ? More redness, swelling, or pain. ? Fluid or blood. ? Warmth. ? Pus or a bad smell.  Do not scratch your rash or pick at your blisters. To help avoid scratching: ? Keep your fingernails clean and cut short. ? Wear gloves or mittens while you sleep, if scratching is a  problem. General instructions  Rest as told by your health care provider.  Keep all follow-up visits as told by your health care provider. This is important.  Wash your hands often with soap and water. If soap and water are not available, use hand sanitizer. Doing this lowers your chance of getting a bacterial skin infection.  Before your blisters change into scabs, your shingles infection can cause chickenpox in people who have never had it or have never been vaccinated against it. To prevent this from happening, avoid contact with other people, especially: ? Babies. ? Pregnant women. ? Children who have eczema. ? Elderly people who have transplants. ? People who have chronic illnesses, such as cancer or AIDS. Contact a health care provider if:  Your pain is not relieved with prescribed medicines.  Your pain does not get better after the rash heals.  You have signs  of infection in the rash area, such as: ? More redness, swelling, or pain around the rash. ? Fluid or blood coming from the rash. ? The rash area feeling warm to the touch. ? Pus or a bad smell coming from the rash. Get help right away if:  The rash is on your face or nose.  You have facial pain, pain around your eye area, or loss of feeling on one side of your face.  You have difficulty seeing.  You have ear pain or have ringing in your ear.  You have a loss of taste.  Your condition gets worse. Summary  Shingles, which is also known as herpes zoster, is an infection that causes a painful skin rash and fluid-filled blisters.  This condition is diagnosed with a skin exam. Skin or fluid samples may be taken from the blisters and examined before the diagnosis is made.  Keep your rash covered with a loose bandage (dressing). Wear loose-fitting clothing to help ease the pain of material rubbing against the rash.  Before your blisters change into scabs, your shingles infection can cause chickenpox in people who  have never had it or have never been vaccinated against it. This information is not intended to replace advice given to you by your health care provider. Make sure you discuss any questions you have with your health care provider. Document Released: 08/15/2005 Document Revised: 04/19/2017 Document Reviewed: 04/19/2017 Elsevier Interactive Patient Education  2019 Elsevier Inc.     Valacyclovir caplets What is this medicine? VALACYCLOVIR (val ay SYE kloe veer) is an antiviral medicine. It is used to treat or prevent infections caused by certain kinds of viruses. Examples of these infections include herpes and shingles. This medicine will not cure herpes. This medicine may be used for other purposes; ask your health care provider or pharmacist if you have questions. COMMON BRAND NAME(S): Valtrex What should I tell my health care provider before I take this medicine? They need to know if you have any of these conditions: -acquired immunodeficiency syndrome (AIDS) -any other condition that may weaken the immune system -bone marrow or kidney transplant -kidney disease -an unusual or allergic reaction to valacyclovir, acyclovir, ganciclovir, valganciclovir, other medicines, foods, dyes, or preservatives -pregnant or trying to get pregnant -breast-feeding How should I use this medicine? Take this medicine by mouth with a glass of water. Follow the directions on the prescription label. You can take this medicine with or without food. Take your doses at regular intervals. Do not take your medicine more often than directed. Finish the full course prescribed by your doctor or health care professional even if you think your condition is better. Do not stop taking except on the advice of your doctor or health care professional. Talk to your pediatrician regarding the use of this medicine in children. While this drug may be prescribed for children as young as 2 years for selected conditions, precautions do  apply. Overdosage: If you think you have taken too much of this medicine contact a poison control center or emergency room at once. NOTE: This medicine is only for you. Do not share this medicine with others. What if I miss a dose? If you miss a dose, take it as soon as you can. If it is almost time for your next dose, take only that dose. Do not take double or extra doses. What may interact with this medicine? -cimetidine -probenecid This list may not describe all possible interactions. Give your health care provider a  list of all the medicines, herbs, non-prescription drugs, or dietary supplements you use. Also tell them if you smoke, drink alcohol, or use illegal drugs. Some items may interact with your medicine. What should I watch for while using this medicine? Tell your doctor or health care professional if your symptoms do not start to get better after 1 week. This medicine works best when taken early in the course of an infection, within the first 2 hours. Begin treatment as soon as possible after the first signs of infection like tingling, itching, or pain in the affected area. It is possible that genital herpes may still be spread even when you are not having symptoms. Always use safer sex practices like condoms made of latex or polyurethane whenever you have sexual contact. You should stay well hydrated while taking this medicine. Drink plenty of fluids. What side effects may I notice from receiving this medicine? Side effects that you should report to your doctor or health care professional as soon as possible: -allergic reactions like skin rash, itching or hives, swelling of the face, lips, or tongue -aggressive behavior -confusion -hallucinations -problems with balance, talking, walking -stomach pain -tremor -trouble passing urine or change in the amount of urine Side effects that usually do not require medical attention (report to your doctor or health care professional if  they continue or are bothersome): -dizziness -headache -nausea, vomiting This list may not describe all possible side effects. Call your doctor for medical advice about side effects. You may report side effects to FDA at 1-800-FDA-1088. Where should I keep my medicine? Keep out of the reach of children. Store at room temperature between 15 and 25 degrees C (59 and 77 degrees F). Keep container tightly closed. Throw away any unused medicine after the expiration date. NOTE: This sheet is a summary. It may not cover all possible information. If you have questions about this medicine, talk to your doctor, pharmacist, or health care provider.  2019 Elsevier/Gold Standard (2012-07-31 16:34:05)

## 2018-11-02 NOTE — Progress Notes (Signed)
Assessment and Plan:  Kiernan was seen today for acute visit.  Diagnoses and all orders for this visit:  Left temporal headache/Rash of face/Left eye pain Neuro exam normal; main concern is Temporal arteritis vs shingles though lesions are somewhat atypical; will check labs, proceed with antiviral and prednisone taper lyrica 50 mg samples provided to try for pain; instructed 1-2 caps TID PRN She will urgently schedule appointment with ophthalmology  Pending labs may increase steroid dose further if suggestive of arteritis The patient was advised to call immediately if she has any concerning symptoms in the interval. The patient voices understanding of current treatment options and is in agreement with the current care plan.The patient knows to call the clinic with any problems, questions or concerns or go to the ER if any further progression of symptoms.  We will follow up in 1 week or sooner if needed -     C-reactive protein -     CBC with Differential/Platelet -     Sedimentation rate -     valACYclovir (VALTREX) 1000 MG tablet; Take 1 tablet (1,000 mg total) by mouth 3 (three) times daily. Take for 7 days -     predniSONE (DELTASONE) 20 MG tablet; 3 tablets daily with food for 3 days, 2 tabs daily for 3 days, 1 tab a day for 5 days.  Further disposition pending results of labs. Discussed med's effects and SE's.   Over 15 minutes of exam, counseling, chart review, and critical decision making was performed.   Future Appointments  Date Time Provider [place]  01/17/2019  2:30 PM Unk Pinto, MD [facility] None  04/15/2019  2:00 PM Liane Comber, NP [facility] None    ------------------------------------------------------------------------------------------------------------------   HPI BP 120/60   Pulse 67   Temp 97.7 F (36.5 C)   Ht 5\' 4"  (1.626 m)   Wt 140 lb (63.5 kg)   SpO2 97%   BMI 24.03 kg/m   73 y.o.female presents for evaluation of HA, L sided  "pimples" to scalp and temple, eyelid that began 5 days ago; she reports initially had pain, burning of L eye brow, eye lid, noted a few scattered "pimple like" lesions pop up but quickly resolved, currently with single tender area to L scalp, persistent L sided temporal headache, also pain in L eye. She denies dizziness, vision changes, numbness/tingling of extremities, weakness, slurring, fever/chills, neck pain/stiffness.   Eye pain without vision changes, denies foreign body sensation. She does have established ophthalmologist though hasn't seen yet.   Past Medical History:  Diagnosis Date  . Anemia   . Anxiety   . Arthritis    hands and spine  . Blood transfusion without reported diagnosis   . Cervical dysplasia 1976  . Diverticulitis of colon (without mention of hemorrhage)(562.11)   . Diverticulosis of colon (without mention of hemorrhage)   . Elevated cholesterol   . GERD (gastroesophageal reflux disease)   . Heart murmur   . Hypertension    pt states she takes med for tachycardia NOT HTN -metoprolol taken for tachycardia  . Internal hemorrhoids without mention of complication   . Mitral valve disorders(424.0)   . MRSA infection   . Osteoporosis    spine  . Palpitations   . Pelvic adhesive disease   . Plaque psoriasis   . Prediabetes   . Tachycardia    never had HTN  . Vitamin D deficiency      Allergies  Allergen Reactions  . Bactrim [Sulfamethoxazole-Trimethoprim] Swelling  Face and eyes swollen and red.  Marland Kitchen Ketek [Telithromycin]     Unknown    Current Outpatient Medications on File Prior to Visit  Medication Sig  . atorvastatin (LIPITOR) 40 MG tablet Take 1 tablet (40 mg total) by mouth 3 (three) times a week. Take in evening for cholesterol.  . B Complex Vitamins (B COMPLEX PO) Take 1 tablet by mouth every other day.   . Cholecalciferol (VITAMIN D3) 5000 UNITS TABS Take 5,000 Units by mouth 2 (two) times daily.   Marland Kitchen LORazepam (ATIVAN) 1 MG tablet TAKE  ONE-HALF TO ONE TABLET BY MOUTH AT HOUR OF SLEEP ONLY IF NEEDED. PRESCRIPTION TO LAST UNTIL 10-08-2018  . Magnesium 250 MG TABS Take 250 mg by mouth daily.   . metoprolol tartrate (LOPRESSOR) 25 MG tablet TAKE ONE TABLET BY MOUTH DAILY  . sertraline (ZOLOFT) 100 MG tablet Take 1 tablet (100 mg total) by mouth daily.   No current facility-administered medications on file prior to visit.     ROS: all negative except above.   Physical Exam:  BP 120/60   Pulse 67   Temp 97.7 F (36.5 C)   Ht 5\' 4"  (1.626 m)   Wt 140 lb (63.5 kg)   SpO2 97%   BMI 24.03 kg/m   General Appearance: Well nourished, in no apparent distress. Eyes: PERRLA, EOMs, conjunctiva no swelling or erythema. No obvious changes to cornea.  ENT/Mouth: Ext aud canals clear, TMs without erythema, bulging. No erythema, swelling, or exudate on post pharynx.  Tonsils not swollen or erythematous. Hearing normal.  Neck: Supple Respiratory: Respiratory effort normal, BS equal bilaterally without rales, rhonchi, wheezing or stridor.  Cardio: RRR with no MRGs. Brisk peripheral pulses without edema.  Lymphatics: Non tender without lymphadenopathy.  Musculoskeletal: 5/5 strength, normal gait.  Skin: Warm, dry without ecchymosis. She has no remaining lesions to L eye brow/lid area, none to nose, some post-inflammatory erythema does remain; she has several erythematous/tender/inflamed areas to scalp, no notable discharge, not vesicular.  Neuro: Cranial nerves intact. Normal muscle tone, no cerebellar symptoms. Sensation intact.  Psych: Awake and oriented X 3, normal affect, Insight and Judgment appropriate.     Izora Ribas, NP 10:23 AM [facility]

## 2018-11-03 LAB — CBC WITH DIFFERENTIAL/PLATELET
Absolute Monocytes: 405 cells/uL (ref 200–950)
Basophils Absolute: 20 cells/uL (ref 0–200)
Basophils Relative: 0.4 %
Eosinophils Absolute: 140 cells/uL (ref 15–500)
Eosinophils Relative: 2.8 %
HEMATOCRIT: 35.6 % (ref 35.0–45.0)
Hemoglobin: 12.3 g/dL (ref 11.7–15.5)
LYMPHS ABS: 905 {cells}/uL (ref 850–3900)
MCH: 32.4 pg (ref 27.0–33.0)
MCHC: 34.6 g/dL (ref 32.0–36.0)
MCV: 93.7 fL (ref 80.0–100.0)
MPV: 9.3 fL (ref 7.5–12.5)
Monocytes Relative: 8.1 %
NEUTROS PCT: 70.6 %
Neutro Abs: 3530 cells/uL (ref 1500–7800)
Platelets: 226 10*3/uL (ref 140–400)
RBC: 3.8 10*6/uL (ref 3.80–5.10)
RDW: 12.9 % (ref 11.0–15.0)
Total Lymphocyte: 18.1 %
WBC: 5 10*3/uL (ref 3.8–10.8)

## 2018-11-03 LAB — C-REACTIVE PROTEIN: CRP: 10.6 mg/L — ABNORMAL HIGH (ref <8.0)

## 2018-11-08 NOTE — Progress Notes (Signed)
Assessment and Plan:  [name] [name] was seen today for follow-up.  Diagnoses and all orders for this visit:  Acute intractable headache, unspecified headache type/ New daily persistent headache Normal neuro; still possibly post-herpetic neuralgia though description of pain atypical, due to age, new onset atypical persistent headache will proceed with CT W/WO to r/o alterative etiology, recheck CRP, only mild elevations 1 week ago Will go to the ER if worsening headache, changes vision/speech, imbalance, weakness. -     C-reactive protein -     Varicella zoster antibody, IgG -     Varicella zoster antibody, IgM -     CT HEAD W & WO CONTRAST; Future  Medication management -     COMPLETE METABOLIC PANEL WITH GFR  Other orders -     gabapentin (NEURONTIN) 100 MG capsule; Take 1-3 caps up to 3 times a day as needed for pain.  Further disposition pending results of labs. Discussed med's effects and SE's.   Over 30 minutes of exam, counseling, chart review, and critical decision making was performed.   Future Appointments  Date Time Provider [place]  01/17/2019  2:30 PM Unk Pinto, MD [facility] None  04/15/2019  2:00 PM Liane Comber, NP [facility] None    ------------------------------------------------------------------------------------------------------------------   HPI BP 110/64   Pulse 66   Temp 97.9 F (36.6 C)   Ht _0  (1.626 m)   Wt 141 lb 9.6 oz (64.2 kg)   SpO2 92%   BMI 24.31 kg/m   73 y.o.female with hx of atherosclerosis of aorta, htn, hyperlipidemia, mitral valve disorder, hereditary and idiopathic peripheral neuropathy presents for 1 week follow up on presumed shingles of temple/scalp (L sided temporal pain with rash, normal neuro exam, ESR/CRP neg); she was prescribed valtrex, steroid taper, lyrica 50-100 mg TID PRN pain. She has completed valtrex, in process with steroid taper. She reports no notable improvement in pain with antiviral/steroid,  though rash has nearly resolved. She reports pain as 9/10 constant, she did finally try lyrica 50 mg TID which does improve her pain but makes her feel very drowsy. She reports pain is constant, dull/intense, rates as 9/10, non-radiating, "sometimes feels like a drill in my head."   She did see ophthalmology due to reported ocular pain, no changes in vision, cleared for lesion of eye.   CBC, ESR were normal, CRP was 10.6, unlikely temporal arteritis but completed steroid taper.  No hx of headaches, she did not have shingles vaccine, no recent trauma or falls, no an anticoags/ASA.    Past Medical History:  Diagnosis Date  . Anemia   . Anxiety   . Arthritis    hands and spine  . Blood transfusion without reported diagnosis   . Cervical dysplasia 1976  . Diverticulitis of colon (without mention of hemorrhage)(562.11)   . Diverticulosis of colon (without mention of hemorrhage)   . Elevated cholesterol   . GERD (gastroesophageal reflux disease)   . Heart murmur   . Hypertension    pt states she takes med for tachycardia NOT HTN -metoprolol taken for tachycardia  . Internal hemorrhoids without mention of complication   . Mitral valve disorders(424.0)   . MRSA infection   . Osteoporosis    spine  . Palpitations   . Pelvic adhesive disease   . Plaque psoriasis   . Prediabetes   . Tachycardia    never had HTN  . Vitamin D deficiency      Allergies  Allergen Reactions  . Bactrim [Sulfamethoxazole-Trimethoprim]  Swelling    Face and eyes swollen and red.  Marland Kitchen Ketek [Telithromycin]     Unknown    Current Outpatient Medications on File Prior to Visit  Medication Sig  . atorvastatin (LIPITOR) 40 MG tablet Take 1 tablet (40 mg total) by mouth 3 (three) times a week. Take in evening for cholesterol.  . B Complex Vitamins (B COMPLEX PO) Take 1 tablet by mouth every other day.   . Cholecalciferol (VITAMIN D3) 5000 UNITS TABS Take 5,000 Units by mouth 2 (two) times daily.   Marland Kitchen LORazepam  (ATIVAN) 1 MG tablet TAKE ONE-HALF TO ONE TABLET BY MOUTH AT HOUR OF SLEEP ONLY IF NEEDED. PRESCRIPTION TO LAST UNTIL 10-08-2018  . Magnesium 250 MG TABS Take 250 mg by mouth daily.   . metoprolol tartrate (LOPRESSOR) 25 MG tablet TAKE ONE TABLET BY MOUTH DAILY  . predniSONE (DELTASONE) 20 MG tablet 3 tablets daily with food for 3 days, 2 tabs daily for 3 days, 1 tab a day for 5 days.  Marland Kitchen sertraline (ZOLOFT) 100 MG tablet Take 1 tablet (100 mg total) by mouth daily.  . valACYclovir (VALTREX) 1000 MG tablet Take 1 tablet (1,000 mg total) by mouth 3 (three) times daily. Take for 7 days   No current facility-administered medications on file prior to visit.     ROS: all negative except above.   Physical Exam:  BP 110/64   Pulse 66   Temp 97.9 F (36.6 C)   Ht _0  (1.626 m)   Wt 141 lb 9.6 oz (64.2 kg)   SpO2 92%   BMI 24.31 kg/m   General Appearance: Well nourished, in no acute distress. Eyes: PERRLA, EOMs, conjunctiva no swelling or erythema.  ENT/Mouth: Ext aud canals clear, TMs without erythema, bulging. No erythema, swelling, or exudate on post pharynx.  Tonsils not swollen or erythematous. Hearing normal.  Neck: Supple Respiratory: Respiratory effort normal, BS equal bilaterally without rales, rhonchi, wheezing or stridor.  Cardio: RRR with no MRGs. Brisk peripheral pulses without edema.  Lymphatics: Non tender without lymphadenopathy.  Musculoskeletal: 5/5 strength, normal gait.  Skin: Warm, dry without ecchymosis. She has no remaining lesions to L eye brow/lid area, none to nose, some post-inflammatory erythema does remain to lesions on scalp; also dry/flaky; no discharge Neuro: Cranial nerves intact. Normal muscle tone, no cerebellar symptoms. Sensation intact.  Psych: Awake and oriented X 3, normal affect, Insight and Judgment appropriate.     Izora Ribas, NP 11:02 AM [facility]

## 2018-11-09 ENCOUNTER — Ambulatory Visit (INDEPENDENT_AMBULATORY_CARE_PROVIDER_SITE_OTHER): Payer: PPO | Admitting: Adult Health

## 2018-11-09 ENCOUNTER — Encounter: Payer: Self-pay | Admitting: Adult Health

## 2018-11-09 ENCOUNTER — Other Ambulatory Visit: Payer: Self-pay

## 2018-11-09 VITALS — BP 110/64 | HR 66 | Temp 97.9°F | Ht 64.0 in | Wt 141.6 lb

## 2018-11-09 DIAGNOSIS — B028 Zoster with other complications: Secondary | ICD-10-CM | POA: Diagnosis not present

## 2018-11-09 DIAGNOSIS — R51 Headache: Secondary | ICD-10-CM | POA: Diagnosis not present

## 2018-11-09 DIAGNOSIS — R21 Rash and other nonspecific skin eruption: Secondary | ICD-10-CM

## 2018-11-09 DIAGNOSIS — G4452 New daily persistent headache (NDPH): Secondary | ICD-10-CM | POA: Diagnosis not present

## 2018-11-09 DIAGNOSIS — Z79899 Other long term (current) drug therapy: Secondary | ICD-10-CM

## 2018-11-09 DIAGNOSIS — R519 Headache, unspecified: Secondary | ICD-10-CM

## 2018-11-09 MED ORDER — GABAPENTIN 100 MG PO CAPS: ORAL_CAPSULE | ORAL | 2 refills | Status: DC

## 2018-11-09 NOTE — Patient Instructions (Signed)
Postherpetic Neuralgia Postherpetic neuralgia (PHN) is nerve pain that occurs after a shingles infection. Shingles is a painful rash that appears on one area of the body, usually on the trunk or face. Shingles is caused by the varicella-zoster virus. This is the same virus that causes chickenpox. In people who have had chickenpox, the virus can resurface years later and cause shingles. You may have PHN if you continue to have pain for 4 months after your shingles rash has gone away. PHN appears in the same area where you had the shingles rash. The pain usually goes away after the rash disappears. Getting a vaccination for shingles can prevent PHN. This vaccine is recommended for people older than 60. It may prevent shingles, and may also lower your risk of PHN if you do get shingles. What are the causes? This condition is caused by damage to your nerves from the varicella-zoster virus. The damage makes your nerves overly sensitive. What increases the risk? The following factors may make you more likely to develop this condition:  Being older than 72 years of age.  Having severe pain before your shingles rash starts.  Having a severe rash.  Having shingles in and around the eye area.  Having a disease that makes your body unable to fight infections (weak immune system). What are the signs or symptoms? The main symptom of this condition is pain. The pain may:  Often be very bad and may be described as stabbing, burning, or feeling like an electric shock.  Come and go or may be there all the time.  Be triggered by light touches on the skin or changes in temperature. You may have itching along with the pain. How is this diagnosed? This condition may be diagnosed based on your symptoms and your history of shingles. Lab studies and other diagnostic tests are usually not needed. How is this treated? There is no cure for this condition. Treatment for PHN will focus on pain relief.  Over-the-counter pain relievers do not usually relieve PHN pain. You may need to work with a pain specialist. Treatment may include:  Antidepressant medicines to help with pain and improve sleep.  Anti-seizure medicines to relieve nerve pain.  Strong pain relievers (opioids).  A numbing patch worn on the skin (lidocaine patch).  Botox (botulinum toxin) injections to block pain signals between nerves and muscles.  Injections of numbing medicine or anti-inflammatory medicines around irritated nerves. Follow these instructions at home:   It may take a long time to recover from PHN. Work closely with your health care provider and develop a good support system at home.  Take over-the-counter and prescription medicines only as told by your health care provider.  Do not drive or use heavy machinery while taking prescription pain medicine.  Wear loose, comfortable clothing.  Cover sensitive areas with a dressing to reduce friction from clothing rubbing on the area.  If directed, put ice on the painful area: ? Put ice in a plastic bag. ? Place a towel between your skin and the bag. ? Leave the ice on for 20 minutes, 2-3 times a day.  Talk to your health care provider if you feel depressed or desperate. Living with long-term pain can be depressing.  Keep all follow-up visits as told by your health care provider. This is important. Contact a health care provider if:  Your medicine is not helping.  You are struggling to manage your pain at home. Summary  Postherpetic neuralgia is a very painful disorder   that can occur after an episode of shingles.  The pain is often severe, burning, electric, or stabbing.  Prescription medicines can be helpful in managing persistent pain.  Getting a vaccination for shingles can prevent PHN. This vaccine is recommended for people older than 60. This information is not intended to replace advice given to you by your health care provider. Make sure  you discuss any questions you have with your health care provider. Document Released: 11/05/2002 Document Revised: 11/01/2016 Document Reviewed: 11/01/2016 Elsevier Interactive Patient Education  2019 [place].    Gabapentin capsules or tablets What is this medicine? GABAPENTIN (GA ba pen tin) is used to control seizures in certain types of epilepsy. It is also used to treat certain types of nerve pain. This medicine may be used for other purposes; ask your health care provider or pharmacist if you have questions. COMMON BRAND NAME(S): Active-PAC with Gabapentin, Gabarone, Neurontin What should I tell my health care provider before I take this medicine? They need to know if you have any of these conditions: -kidney disease -suicidal thoughts, plans, or attempt; a previous suicide attempt by you or a family member -an unusual or allergic reaction to gabapentin, other medicines, foods, dyes, or preservatives -pregnant or trying to get pregnant -breast-feeding How should I use this medicine? Take this medicine by mouth with a glass of water. Follow the directions on the prescription label. You can take it with or without food. If it upsets your stomach, take it with food. Take your medicine at regular intervals. Do not take it more often than directed. Do not stop taking except on your doctor's advice. If you are directed to break the 600 or 800 mg tablets in half as part of your dose, the extra half tablet should be used for the next dose. If you have not used the extra half tablet within 28 days, it should be thrown away. A special MedGuide will be given to you by the pharmacist with each prescription and refill. Be sure to read this information carefully each time. Talk to your pediatrician regarding the use of this medicine in children. While this drug may be prescribed for children as young as 3 years for selected conditions, precautions do apply. Overdosage: If you think you have taken  too much of this medicine contact a poison control center or emergency room at once. NOTE: This medicine is only for you. Do not share this medicine with others. What if I miss a dose? If you miss a dose, take it as soon as you can. If it is almost time for your next dose, take only that dose. Do not take double or extra doses. What may interact with this medicine? Do not take this medicine with any of the following medications: -other gabapentin products This medicine may also interact with the following medications: -alcohol -antacids -antihistamines for allergy, cough and cold -certain medicines for anxiety or sleep -certain medicines for depression or psychotic disturbances -homatropine; hydrocodone -naproxen -narcotic medicines (opiates) for pain -phenothiazines like chlorpromazine, mesoridazine, prochlorperazine, thioridazine This list may not describe all possible interactions. Give your health care provider a list of all the medicines, herbs, non-prescription drugs, or dietary supplements you use. Also tell them if you smoke, drink alcohol, or use illegal drugs. Some items may interact with your medicine. What should I watch for while using this medicine? Visit your doctor or health care professional for regular checks on your progress. You may want to keep a record at home  of how you feel your condition is responding to treatment. You may want to share this information with your doctor or health care professional at each visit. You should contact your doctor or health care professional if your seizures get worse or if you have any new types of seizures. Do not stop taking this medicine or any of your seizure medicines unless instructed by your doctor or health care professional. Stopping your medicine suddenly can increase your seizures or their severity. Wear a medical identification bracelet or chain if you are taking this medicine for seizures, and carry a card that lists all your  medications. You may get drowsy, dizzy, or have blurred vision. Do not drive, use machinery, or do anything that needs mental alertness until you know how this medicine affects you. To reduce dizzy or fainting spells, do not sit or stand up quickly, especially if you are an older patient. Alcohol can increase drowsiness and dizziness. Avoid alcoholic drinks. Your mouth may get dry. Chewing sugarless gum or sucking hard candy, and drinking plenty of water will help. The use of this medicine may increase the chance of suicidal thoughts or actions. Pay special attention to how you are responding while on this medicine. Any worsening of mood, or thoughts of suicide or dying should be reported to your health care professional right away. Women who become pregnant while using this medicine may enroll in the [place] Pregnancy Registry by calling [phone]. This registry collects information about the safety of antiepileptic drug use during pregnancy. What side effects may I notice from receiving this medicine? Side effects that you should report to your doctor or health care professional as soon as possible: -allergic reactions like skin rash, itching or hives, swelling of the face, lips, or tongue -worsening of mood, thoughts or actions of suicide or dying Side effects that usually do not require medical attention (report to your doctor or health care professional if they continue or are bothersome): -constipation -difficulty walking or controlling muscle movements -dizziness -nausea -slurred speech -tiredness -tremors -weight gain This list may not describe all possible side effects. Call your doctor for medical advice about side effects. You may report side effects to FDA at 1-800-FDA-1088. Where should I keep my medicine? Keep out of reach of children. This medicine may cause accidental overdose and death if it taken by other adults, children, or pets. Mix any unused  medicine with a substance like cat litter or coffee grounds. Then throw the medicine away in a sealed container like a sealed bag or a coffee can with a lid. Do not use the medicine after the expiration date. Store at room temperature between 15 and 30 degrees C (59 and 86 degrees F). NOTE: This sheet is a summary. It may not cover all possible information. If you have questions about this medicine, talk to your doctor, pharmacist, or health care provider.  2019 Elsevier/Gold Standard (2018-01-18 13:21:44)

## 2018-11-12 LAB — COMPLETE METABOLIC PANEL WITH GFR
AG RATIO: 1.5 (calc) (ref 1.0–2.5)
ALKALINE PHOSPHATASE (APISO): 59 U/L (ref 37–153)
ALT: 16 U/L (ref 6–29)
AST: 15 U/L (ref 10–35)
Albumin: 4 g/dL (ref 3.6–5.1)
BILIRUBIN TOTAL: 0.3 mg/dL (ref 0.2–1.2)
BUN: 20 mg/dL (ref 7–25)
CO2: 24 mmol/L (ref 20–32)
Calcium: 9.5 mg/dL (ref 8.6–10.4)
Chloride: 105 mmol/L (ref 98–110)
Creat: 0.93 mg/dL (ref 0.60–0.93)
GFR, Est African American: 72 mL/min/{1.73_m2} (ref >=60)
GFR, Est Non African American: 62 mL/min/{1.73_m2} (ref >=60)
Globulin: 2.6 g/dL (calc) (ref 1.9–3.7)
Glucose, Bld: 95 mg/dL (ref 65–99)
Potassium: 4.8 mmol/L (ref 3.5–5.3)
Sodium: 137 mmol/L (ref 135–146)
Total Protein: 6.6 g/dL (ref 6.1–8.1)

## 2018-11-12 LAB — VARICELLA ZOSTER ANTIBODY, IGM: Varicella Zoster Ab IgM: 1.69 — ABNORMAL HIGH (ref <=0.90)

## 2018-11-12 LAB — C-REACTIVE PROTEIN: CRP: 6.7 mg/L (ref <8.0)

## 2018-11-12 LAB — VARICELLA ZOSTER ANTIBODY, IGG: Varicella IgG: >4000 index

## 2018-11-30 ENCOUNTER — Ambulatory Visit
Admission: RE | Admit: 2018-11-30 | Discharge: 2018-11-30 | Disposition: A | Discharge status: Home / Self Care | Payer: PPO | Source: Ambulatory Visit | Attending: Adult Health | Admitting: Adult Health

## 2018-11-30 ENCOUNTER — Other Ambulatory Visit: Payer: Self-pay

## 2018-11-30 DIAGNOSIS — G4452 New daily persistent headache (NDPH): Secondary | ICD-10-CM

## 2018-11-30 DIAGNOSIS — G4733 Obstructive sleep apnea (adult) (pediatric): Secondary | ICD-10-CM | POA: Diagnosis not present

## 2018-11-30 DIAGNOSIS — R51 Headache: Secondary | ICD-10-CM | POA: Diagnosis not present

## 2018-11-30 MED ORDER — IOPAMIDOL (ISOVUE-300) INJECTION 61%
75.0000 mL | Freq: Once | INTRAVENOUS | Status: AC | PRN
  Administered 2018-11-30: 14:00:00 75 mL via INTRAVENOUS

## 2018-12-05 DIAGNOSIS — W19XXXA Unspecified fall, initial encounter: Secondary | ICD-10-CM | POA: Diagnosis not present

## 2018-12-05 DIAGNOSIS — R51 Headache: Secondary | ICD-10-CM | POA: Diagnosis not present

## 2018-12-05 DIAGNOSIS — S0091XA Abrasion of unspecified part of head, initial encounter: Secondary | ICD-10-CM | POA: Diagnosis not present

## 2018-12-05 DIAGNOSIS — Y92009 Unspecified place in unspecified non-institutional (private) residence as the place of occurrence of the external cause: Secondary | ICD-10-CM | POA: Diagnosis not present

## 2019-01-03 ENCOUNTER — Other Ambulatory Visit: Payer: Self-pay | Admitting: Adult Health

## 2019-01-16 ENCOUNTER — Encounter: Payer: Self-pay | Admitting: Internal Medicine

## 2019-01-16 NOTE — Patient Instructions (Signed)

## 2019-01-16 NOTE — Progress Notes (Signed)
History of Present Illness:      This very nice 73 y.o. MWF presents for 3 month follow up with HTN, HLD, Pre-Diabetes and Vitamin D Deficiency.       Patient is treated for HTN since 1998 & BP has been controlled at home. Today's BP is at goal - 104/70. Patient has had no complaints of any cardiac type chest pain, palpitations, dyspnea / orthopnea / PND, dizziness, claudication, or dependent edema.      Hyperlipidemia is controlled with diet & Atorvastatin. Patient denies myalgias or other med SE's. Last Lipids were at goal: Lab Results  Component Value Date   CHOL 185 07/19/2018   HDL 78 07/19/2018   LDLCALC 86 07/19/2018   TRIG 110 07/19/2018   CHOLHDL 2.4 07/19/2018       Also, the patient has history of  PreDiabetes (A1c 6.0% / 2015)  and has had no symptoms of reactive hypoglycemia, diabetic polys, paresthesias or visual blurring.  Last A1c was Normal & at goal: Lab Results  Component Value Date   HGBA1C 5.5 07/19/2018      Further, the patient also has history of Vitamin D Deficiency and supplements vitamin D without any suspected side-effects. Last vitamin D was at goal: Lab Results  Component Value Date   VD25OH 78 07/19/2018   Current Outpatient Medications on File Prior to Visit  Medication Sig  . atorvastatin (LIPITOR) 40 MG tablet Take 1 tablet (40 mg total) by mouth 3 (three) times a week. Take in evening for cholesterol.  . B Complex Vitamins (B COMPLEX PO) Take 1 tablet by mouth every other day.   . Cholecalciferol (VITAMIN D3) 5000 UNITS TABS Take 5,000 Units by mouth 2 (two) times daily.   Marland Kitchen LORazepam (ATIVAN) 1 MG tablet TAKE ONE-HALF TO ONE TABLET BY MOUTH AT HOUR OF SLEEP ONLY IF NEEDED.  . Magnesium 250 MG TABS Take 250 mg by mouth daily.   . metoprolol tartrate (LOPRESSOR) 25 MG tablet TAKE ONE TABLET BY MOUTH DAILY  . sertraline (ZOLOFT) 100 MG tablet Take 1 tablet (100 mg total) by mouth daily.   No current facility-administered medications on file  prior to visit.    Allergies  Allergen Reactions  . Bactrim [Sulfamethoxazole-Trimethoprim] Swelling    Face and eyes swollen and red.  Marland Kitchen Ketek [Telithromycin]     Unknown   PMHx:   Past Medical History:  Diagnosis Date  . Anemia   . Anxiety   . Arthritis    hands and spine  . Blood transfusion without reported diagnosis   . Cervical dysplasia 1976  . Diverticulitis of colon (without mention of hemorrhage)(562.11)   . Diverticulosis of colon (without mention of hemorrhage)   . Elevated cholesterol   . GERD (gastroesophageal reflux disease)   . Heart murmur   . Hypertension    pt states she takes med for tachycardia NOT HTN -metoprolol taken for tachycardia  . Internal hemorrhoids without mention of complication   . Mitral valve disorders(424.0)   . MRSA infection   . Osteoporosis    spine  . Palpitations   . Pelvic adhesive disease   . Plaque psoriasis   . Prediabetes   . Tachycardia    never had HTN  . Vitamin D deficiency    Immunization History  Administered Date(s) Administered  . DT 04/27/2015  . Pneumococcal Conjugate-13 04/27/2015  . Pneumococcal Polysaccharide-23 12/22/2004, 11/24/2016  . Rabies, IM 05/04/2016, 05/07/2016  . Td 12/16/2003   Past Surgical  History:  Procedure Laterality Date  . ABDOMINAL HYSTERECTOMY  2000   TAH,BSO endometriosis, leiomyomata  . COLONOSCOPY    . [place]  . ESOPHAGOGASTRODUODENOSCOPY    . FINGER SURGERY    . GANGLION CYST EXCISION Right    hand   . GYNECOLOGIC CRYOSURGERY  1976  . HEMORRHOID SURGERY    . OOPHORECTOMY    . ROTATOR CUFF REPAIR Left   . ROTATOR CUFF REPAIR Right 2/15  . TYMPANOPLASTY Right   . UPPER GASTROINTESTINAL ENDOSCOPY     FHx:    Reviewed / unchanged SHx:    Reviewed / unchanged   Systems Review:  Constitutional: Denies fever, chills, wt changes, headaches, insomnia, fatigue, night sweats, change in appetite. Eyes: Denies redness, blurred vision, diplopia,  discharge, itchy, watery eyes.  ENT: Denies discharge, congestion, post nasal drip, epistaxis, sore throat, earache, hearing loss, dental pain, tinnitus, vertigo, sinus pain, snoring.  CV: Denies chest pain, palpitations, irregular heartbeat, syncope, dyspnea, diaphoresis, orthopnea, PND, claudication or edema. Respiratory: denies cough, dyspnea, DOE, pleurisy, hoarseness, laryngitis, wheezing.  Gastrointestinal: Denies dysphagia, odynophagia, heartburn, reflux, water brash, abdominal pain or cramps, nausea, vomiting, bloating, diarrhea, constipation, hematemesis, melena, hematochezia  or hemorrhoids. Genitourinary: Denies dysuria, frequency, urgency, nocturia, hesitancy, discharge, hematuria or flank pain. Musculoskeletal: Denies arthralgias, myalgias, stiffness, jt. swelling, pain, limping or strain/sprain.  Skin: Denies pruritus, rash, hives, warts, acne, eczema or change in skin lesion(s). Neuro: No weakness, tremor, incoordination, spasms, paresthesia or pain. Psychiatric: Denies confusion, memory loss or sensory loss. Endo: Denies change in weight, skin or hair change.  Heme/Lymph: No excessive bleeding, bruising or enlarged lymph nodes.  Physical Exam  BP 104/70   Pulse 76   Temp (!) 97.2 F (36.2 C)   Resp 16   Ht 5\' 4"  (1.626 m)   Wt 138 lb 6.4 oz (62.8 kg)   BMI 23.76 kg/m   Appears  well nourished, well groomed  and in no distress.  Eyes: PERRLA, EOMs, conjunctiva no swelling or erythema. Sinuses: No frontal/maxillary tenderness ENT/Mouth: EAC's clear, TM's nl w/o erythema, bulging. Nares clear w/o erythema, swelling, exudates. Oropharynx clear without erythema or exudates. Oral hygiene is good. Tongue normal, non obstructing. Hearing intact.  Neck: Supple. Thyroid not palpable. Car 2+/2+ without bruits, nodes or JVD. Chest: Respirations nl with BS clear & equal w/o rales, rhonchi, wheezing or stridor.  Cor: Heart sounds normal w/ regular rate and rhythm without sig.  murmurs, gallops, clicks or rubs. Peripheral pulses normal and equal  without edema.  Abdomen: Soft & bowel sounds normal. Non-tender w/o guarding, rebound, hernias, masses or organomegaly.  Lymphatics: Unremarkable.  Musculoskeletal: Full ROM all peripheral extremities, joint stability, 5/5 strength and normal gait.  Skin: Warm, dry without exposed rashes, lesions or ecchymosis apparent.  Neuro: Cranial nerves intact, reflexes equal bilaterally. Sensory-motor testing grossly intact. Tendon reflexes grossly intact.  Pysch: Alert & oriented x 3.  Insight and judgement nl & appropriate. No ideations.  Assessment and Plan:  1. Essential hypertension  - Continue medication, monitor blood pressure at home.  - Continue DASH diet.  Reminder to go to the ER if any CP,  SOB, nausea, dizziness, severe HA, changes vision/speech.  - COMPLETE METABOLIC PANEL WITH GFR - CBC with Differential/Platelet - Magnesium - TSH  2. Hyperlipidemia, mixed  - Continue diet/meds, exercise,& lifestyle modifications.  - Continue monitor periodic cholesterol/liver & renal functions   - Lipid panel - TSH  3. Abnormal glucose  - Continue diet,  exercise  - Lifestyle modifications.  - Monitor appropriate labs.  - Hemoglobin A1c - Insulin, random  4. Vitamin D deficiency  - Continue supplementation.  - VITAMIN D 25 Hydroxyl  5. Prediabetes  - Hemoglobin A1c - Insulin, random  6. Gastroesophageal reflux disease  - COMPLETE METABOLIC PANEL WITH GFR  7. Medication management  - COMPLETE METABOLIC PANEL WITH GFR - CBC with Differential/Platelet - Magnesium - Lipid panel - TSH - Hemoglobin A1c - Insulin, random - VITAMIN D 25 Hydroxyl       Discussed  regular exercise, BP monitoring, weight control to achieve/maintain BMI less than 25 and discussed med and SE's. Recommended labs to assess and monitor clinical status with further disposition pending results of labs. I discussed the assessment  and treatment plan with the patient. The patient was provided an opportunity to ask questions and all were answered. The patient agreed with the plan and demonstrated an understanding of the instructions. I provided over 25 minutes of exam, counseling, chart review and  complex critical decision making was performed   Kirtland Bouchard, MD

## 2019-01-17 ENCOUNTER — Ambulatory Visit: Payer: Self-pay | Admitting: Internal Medicine

## 2019-01-17 ENCOUNTER — Other Ambulatory Visit: Payer: Self-pay

## 2019-01-17 ENCOUNTER — Ambulatory Visit (INDEPENDENT_AMBULATORY_CARE_PROVIDER_SITE_OTHER): Payer: PPO | Admitting: Internal Medicine

## 2019-01-17 VITALS — BP 104/70 | HR 76 | Temp 97.2°F | Resp 16 | Ht 64.0 in | Wt 138.4 lb

## 2019-01-17 DIAGNOSIS — K2100 Gastro-esophageal reflux disease with esophagitis, without bleeding: Secondary | ICD-10-CM

## 2019-01-17 DIAGNOSIS — R7309 Other abnormal glucose: Secondary | ICD-10-CM | POA: Diagnosis not present

## 2019-01-17 DIAGNOSIS — E782 Mixed hyperlipidemia: Secondary | ICD-10-CM

## 2019-01-17 DIAGNOSIS — E559 Vitamin D deficiency, unspecified: Secondary | ICD-10-CM | POA: Diagnosis not present

## 2019-01-17 DIAGNOSIS — Z79899 Other long term (current) drug therapy: Secondary | ICD-10-CM | POA: Diagnosis not present

## 2019-01-17 DIAGNOSIS — I10 Essential (primary) hypertension: Secondary | ICD-10-CM | POA: Diagnosis not present

## 2019-01-17 DIAGNOSIS — R7303 Prediabetes: Secondary | ICD-10-CM

## 2019-01-18 LAB — HEMOGLOBIN A1C
Hgb A1c MFr Bld: 5.6 % of total Hgb (ref <5.7)
Mean Plasma Glucose: 114 (calc)
eAG (mmol/L): 6.3 (calc)

## 2019-01-18 LAB — COMPLETE METABOLIC PANEL WITH GFR
AG Ratio: 1.8 (calc) (ref 1.0–2.5)
ALT: 13 U/L (ref 6–29)
AST: 15 U/L (ref 10–35)
Albumin: 4.4 g/dL (ref 3.6–5.1)
Alkaline phosphatase (APISO): 67 U/L (ref 37–153)
BUN: 15 mg/dL (ref 7–25)
CO2: 27 mmol/L (ref 20–32)
Calcium: 9.6 mg/dL (ref 8.6–10.4)
Chloride: 105 mmol/L (ref 98–110)
Creat: 0.83 mg/dL (ref 0.60–0.93)
GFR, Est African American: 82 mL/min/{1.73_m2} (ref >=60)
GFR, Est Non African American: 71 mL/min/{1.73_m2} (ref >=60)
Globulin: 2.5 g/dL (calc) (ref 1.9–3.7)
Glucose, Bld: 87 mg/dL (ref 65–99)
Potassium: 4.5 mmol/L (ref 3.5–5.3)
Sodium: 139 mmol/L (ref 135–146)
Total Bilirubin: 0.6 mg/dL (ref 0.2–1.2)
Total Protein: 6.9 g/dL (ref 6.1–8.1)

## 2019-01-18 LAB — CBC WITH DIFFERENTIAL/PLATELET
Absolute Monocytes: 332 cells/uL (ref 200–950)
Basophils Absolute: 20 cells/uL (ref 0–200)
Basophils Relative: 0.3 %
Eosinophils Absolute: 150 cells/uL (ref 15–500)
Eosinophils Relative: 2.3 %
HCT: 36 % (ref 35.0–45.0)
Hemoglobin: 12.5 g/dL (ref 11.7–15.5)
Lymphs Abs: 2035 cells/uL (ref 850–3900)
MCH: 31.9 pg (ref 27.0–33.0)
MCHC: 34.7 g/dL (ref 32.0–36.0)
MCV: 91.8 fL (ref 80.0–100.0)
MPV: 9.2 fL (ref 7.5–12.5)
Monocytes Relative: 5.1 %
Neutro Abs: 3965 cells/uL (ref 1500–7800)
Neutrophils Relative %: 61 %
Platelets: 291 10*3/uL (ref 140–400)
RBC: 3.92 10*6/uL (ref 3.80–5.10)
RDW: 13.1 % (ref 11.0–15.0)
Total Lymphocyte: 31.3 %
WBC: 6.5 10*3/uL (ref 3.8–10.8)

## 2019-01-18 LAB — LIPID PANEL
Cholesterol: 184 mg/dL (ref <200)
HDL: 66 mg/dL (ref >=50)
LDL Cholesterol (Calc): 101 mg/dL (calc) — ABNORMAL HIGH
Non-HDL Cholesterol (Calc): 118 mg/dL (calc) (ref <130)
Total CHOL/HDL Ratio: 2.8 (calc) (ref <5.0)
Triglycerides: 79 mg/dL (ref <150)

## 2019-01-18 LAB — INSULIN, RANDOM: Insulin: 6.1 u[IU]/mL

## 2019-01-18 LAB — MAGNESIUM: Magnesium: 1.9 mg/dL (ref 1.5–2.5)

## 2019-01-18 LAB — VITAMIN D 25 HYDROXY (VIT D DEFICIENCY, FRACTURES): Vit D, 25-Hydroxy: 82 ng/mL (ref 30–100)

## 2019-01-18 LAB — TSH: TSH: 1.7 mIU/L (ref 0.40–4.50)

## 2019-01-19 ENCOUNTER — Encounter: Payer: Self-pay | Admitting: Internal Medicine

## 2019-02-10 ENCOUNTER — Other Ambulatory Visit: Payer: Self-pay | Admitting: Physician Assistant

## 2019-04-02 ENCOUNTER — Other Ambulatory Visit: Payer: Self-pay | Admitting: Physician Assistant

## 2019-04-15 ENCOUNTER — Ambulatory Visit: Payer: PPO | Admitting: Adult Health

## 2019-04-15 ENCOUNTER — Ambulatory Visit: Payer: Self-pay | Admitting: Adult Health

## 2019-04-18 DIAGNOSIS — H524 Presbyopia: Secondary | ICD-10-CM | POA: Diagnosis not present

## 2019-04-18 DIAGNOSIS — H25813 Combined forms of age-related cataract, bilateral: Secondary | ICD-10-CM | POA: Diagnosis not present

## 2019-04-18 DIAGNOSIS — H5203 Hypermetropia, bilateral: Secondary | ICD-10-CM | POA: Diagnosis not present

## 2019-04-18 DIAGNOSIS — H52223 Regular astigmatism, bilateral: Secondary | ICD-10-CM | POA: Diagnosis not present

## 2019-04-25 DIAGNOSIS — F17200 Nicotine dependence, unspecified, uncomplicated: Secondary | ICD-10-CM | POA: Insufficient documentation

## 2019-04-25 DIAGNOSIS — Z720 Tobacco use: Secondary | ICD-10-CM | POA: Insufficient documentation

## 2019-04-25 NOTE — Progress Notes (Addendum)
Medicare Wellness Visit and 3 month OV Assessment:   Encounter for annual medicare wellness visit Declines mammogram this year, q2y schedule appropriate with age  Atherosclerosis of aorta Control blood pressure, cholesterol, glucose, increase exercise.   Essential hypertension - continue medications, DASH diet, exercise and monitor at home. Call if greater than 130/80.  - CBC with Differential/Platelet - CMP/GFR  Emphysema STOP SMOKING, uses inhaler PRN with flares, rare  Prediabetes Discussed general issues about diabetes pathophysiology and management., Educational material distributed., Suggested low cholesterol diet., Encouraged aerobic exercise., Discussed foot care., Reminded to get yearly retinal exam.   Mixed hyperlipidemia Currently not taking atorvastatin due to large pill - looked up pills - she will discuss with her pharmacy as to what dose/brand would be a small pill  -continue medications, check lipids, decrease fatty foods, increase activity.    Vitamin D deficiency Continue supplement  Cervical arthritis RICE, NSAIDS,  if not better get xray and PT referral or ortho referral.    Diverticulitis of colon  follow up GI.   Mitral valve disorder No SOB, CP, edema, monitor  Medication management  Estrogen deficiency Dexa in 5 years  Depression  stress management techniques discussed, increase water, good sleep hygiene discussed, increase exercise, and increase veggies.  - will continue zoloft for now, adding trazodone for sleep, may be able to reduce or taper off after mother's anniversary   Insomnia New guidelines suggest the benzodiazepines are best short term, with prolonged use they lead to physical and psychological dependence. In addition, evidence suggest that for insomnia the effectiveness wanes in 4 weeks and the risks out weight their benefits. Use of these agents have been associated with dementia, falls, motor vehicle accidents and physical  addiction. Decreasing these medication have been proven to show improvements in cognition, alertness, decrease of falls and daytime sedation.   We will start a slow taper, symptoms of withdrawal include, insomnia, anxiety, irritability, sweating and stomach or intestinal symptoms like diarrhea or nausea.    good sleep hygiene discussed, increase day time activity,continue ativan - tapering down on dose Will give 50 mg trazodone tabs for mood and sleep; easier to swallow than 150 mg tabs Start with 1/2 tab, gradually increase up to 2 tabs nightly PRN Gradually cut back on ativan with goal to use occasionally only   Current smoker on some days Getting annual CT lung cancer screening, had 08/2018 Smoking cessation-  instruction/counseling given, counseled patient on the dangers of tobacco use, advised patient to stop smoking, and reviewed strategies to maximize success, patient not ready to quit at this time.   Psoriasis Follows with derm; she will message back with current topical steroid dose Can give higher dose if needed during flare; sample of eucrisa provided today  Future Appointments  Date Time Provider [place]  08/01/2019  2:00 PM Vicie Mutters, PA-C [id number] None    Plan:   During the course of the visit the patient was educated and counseled about appropriate screening and preventive services including:    Influenza vaccine  Td vaccine  Screening electrocardiogram  Screening mammography  Bone densitometry screening  Colorectal cancer screening  Diabetes screening  Glaucoma screening  Nutrition counseling    Subjective:   [name] [name] is a 73 y.o. female who presents for Medicare Annual Wellness Visit and 3 month follow up for HTN, chol, preDM, and depression.   Coming up on 1 year anniversary of mother's death; she reports overall doing fairly, has some down days but  is able to recover.  She is on Ativan for sleep and zoloft for  depression; she takes 1/2-1 tab of lorazepam at night to assist with sleep quality. She is currently cutting back on dose to try to taper off but admits due to mood is back to taking 1 tab daily and will run out soon. She tried trazodone 150 mg but couldn't swallow due to large pill  She smokes 1 pack of cigarettes/week - on the weekend with a bottle of wine. Has emphysema by imaging, asymptomatic, was prescribed anoro but felt like this made her choke up and didn't note any benefit so stopped. Has albuterol and reports she uses seldom, <1 x/month.   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  She denies chest pain, shortness of breath, dizziness.   She is on cholesterol medication (atorvastatin 40 mg three times/ week) and denies myalgias. Her cholesterol is at goal. The cholesterol last visit was:   Lab Results  Component Value Date   CHOL 184 01/17/2019   HDL 66 01/17/2019   LDLCALC 101 (H) 01/17/2019   TRIG 79 01/17/2019   CHOLHDL 2.8 01/17/2019   She has been working on diet and exercise for hx of prediabetes, and denies paresthesia of the feet, polydipsia, polyuria and visual disturbances. Last A1C in the office was:  Lab Results  Component Value Date   HGBA1C 5.6 01/17/2019    Lab Results  Component Value Date   GFRNONAA 71 01/17/2019   Patient is on Vitamin D supplement. Lab Results  Component Value Date   VD25OH 82 01/17/2019      Medication Review Current Outpatient Medications on File Prior to Visit  Medication  Sig Dispense Refill  . aspirin EC 81 MG tablet Take 81 mg by mouth daily.    Marland Kitchen atorvastatin (LIPITOR) 40 MG tablet Take 1 tablet (40 mg total) by mouth 3 (three) times a week. Take in evening for cholesterol. 90 tablet 1  . B Complex Vitamins (B COMPLEX PO) Take 1 tablet by mouth every other day.     . Cholecalciferol (VITAMIN D3) 5000 UNITS TABS Take 5,000 Units by mouth 2 (two) times daily.     Marland Kitchen LORazepam (ATIVAN) 1 MG tablet Take 1/2-1 tablet at Bedtime ONLY if  needed for Sleep &  limit to 5 days /week to avoid addiction 90 tablet 0  . Magnesium 250 MG TABS Take 250 mg by mouth daily.     . metoprolol tartrate (LOPRESSOR) 25 MG tablet TAKE ONE TABLET BY MOUTH DAILY 90 tablet 3  . sertraline (ZOLOFT) 100 MG tablet Take 1 tablet Daily for Mood 90 tablet 3   No current facility-administered medications on file prior to visit.     Current Problems (verified) Patient Active Problem List   Diagnosis Date Noted  . Current smoker on some days 04/25/2019  . Depression, major, recurrent, in partial remission ([place]) 07/31/2018  . Pulmonary emphysema ([place])  06/13/2017  . Atherosclerosis of aorta ([place]) 06/13/2017  . Psoriasis 05/25/2016  . Hereditary and idiopathic peripheral neuropathy 05/25/2016  . Encounter for Medicare annual wellness exam 04/27/2015  . Insomnia 11/27/2014  . Cervical arthritis ([place]) 10/10/2014  . Medication management 10/02/2014  . Mixed hyperlipidemia 07/01/2014  . Hypertension   . Other abnormal glucose   . Vitamin D deficiency   . Mitral valve disorder 06/16/2009  . Diverticulitis of colon 05/06/2008    Screening Tests Immunization History  Administered Date(s) Administered  . DT 04/27/2015  . Pneumococcal Conjugate-13 04/27/2015  . Pneumococcal Polysaccharide-23 12/22/2004, 11/24/2016  . Rabies, IM 05/04/2016, 05/07/2016  . Td 12/16/2003   Preventative care: Last colonoscopy: 12/2016, 5 year follow up EGD 02/2015 Last mammogram: 05/2018 - will defer this  year and get next year per patient  Last pap smear/pelvic exam: 11/2012 , 1 abnormal in her 20's, declines another DEXA: at OB/GYN-08/2015 normal Echo 2009 normal EF Ct AB 11/2014 Ct chest: 08/2018 CXR: 2018  MRI abd: 08/2018   Prior vaccinations: TD or Tdap: 2016 Influenza: declines Pneumococcal: 2018 Prevnar 13: 2016 Shingles/Zostavax: declines  Names of Other Physician/Practitioners you currently use: 1. [facility]- here for primary care 2. Dr. Sabra Heck, eye doctor, last visit 2020 3. Dr. Rona Ravens, dentist, last visit 2020, q17m 4. Dr. Renda Rolls, derm, last visit 2019, goes annually, has scheduled next week   Patient Care Team: Unk Pinto, MD as PCP - General (Internal Medicine) Josue Hector, MD as PCP - Cardiology (Cardiology) Justice Britain, MD as Consulting Physician (Orthopedic Surgery) Iran Planas, MD as Consulting Physician (Orthopedic Surgery) Thornell Sartorius, MD as Consulting Physician (Otolaryngology) Josue Hector, MD as Consulting Physician (Cardiology) Rana Snare, MD as Consulting Physician (Urology) Phineas Real, Belinda Block, MD as Consulting Physician (Gynecology) Renda Rolls, Jennefer Bravo, MD as Referring Physician (Dermatology) Marica Otter, OD (Optometry) Merrilee Jansky , DDS   Allergies Allergies  Allergen Reactions  . Bactrim [Sulfamethoxazole-Trimethoprim] Swelling    Face and eyes swollen and red.  Marland Kitchen Ketek [Telithromycin]     Unknown    SURGICAL HISTORY She  has a past surgical history that includes Tympanoplasty (Right); Rotator cuff repair (Left); Ectopic pregnancy surgery (1971, 1979); Hemorrhoid surgery; Oophorectomy; Gynecologic cryosurgery (1976); Abdominal hysterectomy (2000); Rotator cuff repair (Right, 2/15); Colonoscopy; Esophagogastroduodenoscopy; Upper gastrointestinal endoscopy; Ganglion cyst excision (Right); and Finger surgery. FAMILY HISTORY Her family history includes Cirrhosis in her  father; Colon cancer in her maternal grandmother; Colon polyps in her sister; Diabetes in her father and mother; Heart disease in her father, maternal grandfather, paternal grandfather, and paternal grandmother; Hyperlipidemia in her father; Hypertension in her father and mother; Lymphoma in her maternal grandmother; Melanoma in her mother; Stomach cancer in her maternal grandmother; Uterine cancer in her sister. SOCIAL HISTORY She  reports that she has been smoking cigarettes. She has smoked for the past 30.00 years. She has never used smokeless tobacco. She reports current alcohol use. She reports that she does not use drugs.  MEDICARE WELLNESS OBJECTIVES: Physical activity: Current Exercise Habits: Home exercise routine, Type of exercise: walking, Time (Minutes): 15, Frequency (Times/Week): 2(minimal due to heat; will increase as weather cools), Weekly Exercise (Minutes/Week): 30, Intensity: Mild, Exercise limited by: None identified Cardiac risk factors: Cardiac Risk Factors include: advanced age (>72men, >11 women);dyslipidemia;hypertension;family history of premature cardiovascular disease;sedentary lifestyle;smoking/ tobacco exposure Depression/mood screen:   Depression screen [facility] 2/9 04/29/2019  Decreased Interest 0  Down, Depressed, Hopeless 1  PHQ - 2 Score 1  Altered sleeping -  Tired, decreased energy -  Change in appetite -  Feeling bad or failure about yourself  -  Trouble concentrating -  Moving slowly or fidgety/restless -  Suicidal thoughts -  PHQ-9 Score -  Difficult doing work/chores -    ADLs:  In your present state of health, do you have any difficulty performing the following activities: 04/29/2019 01/19/2019  Hearing? N N  Vision? N N  Difficulty concentrating or making decisions? N N  Walking or climbing stairs? N N  Dressing or bathing? N N  Doing errands, shopping? N N  Some recent data might be hidden    Cognitive Testing  Alert? Yes  Normal  Appearance?Yes  Oriented to person? Yes  Place? Yes   Time? Yes  Recall of three objects?  Yes  Can perform simple calculations? Yes  Displays appropriate judgment?Yes  Can read the correct time from a watch face?Yes  EOL planning: Does Patient Have a Medical Advance Directive?: Yes Type of Advance Directive: Living will, Healthcare Power of Attorney Does patient want to make changes to medical advance directive?: No - Patient declined Copy of [place] in Chart?: No - copy requested   Objective:   Blood pressure 104/64, pulse 63, temperature (!) 97.5 F (36.4 C), height 5\' 4"  (1.626 m), weight 141 lb 3.2 oz (64 kg), SpO2 95 %. Body mass index is 24.24 kg/m.  General appearance: alert, no distress, WD/WN,  female HEENT: normocephalic, sclerae anicteric, TMs pearly, nares patent, no discharge or erythema, pharynx normal, right inner nostril at septum with small tender nodule, non fluctuant  Oral cavity: MMM, no lesions Neck: supple, no lymphadenopathy, no thyromegaly, no masses Heart: RRR, bradycardia, slightly holosystolic murmur, normal S1, S2 Lungs: CTA bilaterally, no wheezes, rhonchi, or rales Abdomen: +bs, soft, non tender, no hepatomegaly, no splenomegaly Musculoskeletal: nontender, no swelling, no obvious deformity Extremities: no edema, no cyanosis, no clubbing Pulses: 2+ symmetric, upper and lower extremities, normal cap refill Derm: warm, dry; she has plaque to right foot dorsum, approx 5 cm area, another to R lateral ankle Neurological: alert, oriented x 3, CN2-12 intact, strength normal upper extremities and lower extremities, sensation normal throughout, DTRs 2+ throughout, no cerebellar signs, gait normal Psychiatric: normal affect, behavior normal, pleasant  Breast: defer [id number] Rectal: defer  Medicare Attestation I have personally reviewed: The patient's medical and social history Their use of alcohol, tobacco or illicit drugs Their  current medications and supplements The patient's functional ability including ADLs,fall risks, home safety risks, cognitive, and hearing and visual impairment Diet and physical activities Evidence for depression or mood disorders  The patient's weight, height, BMI, and visual acuity have been recorded in the chart.  I have made referrals, counseling, and provided education to the patient based on review of the above and I have provided the patient with a written personalized care plan for preventive services.     Izora Ribas, NP   04/29/2019

## 2019-04-29 ENCOUNTER — Encounter: Payer: Self-pay | Admitting: Adult Health

## 2019-04-29 ENCOUNTER — Ambulatory Visit (INDEPENDENT_AMBULATORY_CARE_PROVIDER_SITE_OTHER): Payer: PPO | Admitting: Adult Health

## 2019-04-29 ENCOUNTER — Other Ambulatory Visit: Payer: Self-pay

## 2019-04-29 VITALS — BP 104/64 | HR 63 | Temp 97.5°F | Ht 64.0 in | Wt 141.2 lb

## 2019-04-29 DIAGNOSIS — I10 Essential (primary) hypertension: Secondary | ICD-10-CM | POA: Diagnosis not present

## 2019-04-29 DIAGNOSIS — F17200 Nicotine dependence, unspecified, uncomplicated: Secondary | ICD-10-CM

## 2019-04-29 DIAGNOSIS — I059 Rheumatic mitral valve disease, unspecified: Secondary | ICD-10-CM | POA: Diagnosis not present

## 2019-04-29 DIAGNOSIS — R7309 Other abnormal glucose: Secondary | ICD-10-CM

## 2019-04-29 DIAGNOSIS — K5732 Diverticulitis of large intestine without perforation or abscess without bleeding: Secondary | ICD-10-CM

## 2019-04-29 DIAGNOSIS — E559 Vitamin D deficiency, unspecified: Secondary | ICD-10-CM | POA: Diagnosis not present

## 2019-04-29 DIAGNOSIS — E782 Mixed hyperlipidemia: Secondary | ICD-10-CM

## 2019-04-29 DIAGNOSIS — F3341 Major depressive disorder, recurrent, in partial remission: Secondary | ICD-10-CM | POA: Diagnosis not present

## 2019-04-29 DIAGNOSIS — G609 Hereditary and idiopathic neuropathy, unspecified: Secondary | ICD-10-CM

## 2019-04-29 DIAGNOSIS — M47812 Spondylosis without myelopathy or radiculopathy, cervical region: Secondary | ICD-10-CM | POA: Diagnosis not present

## 2019-04-29 DIAGNOSIS — R6889 Other general symptoms and signs: Secondary | ICD-10-CM

## 2019-04-29 DIAGNOSIS — Z0000 Encounter for general adult medical examination without abnormal findings: Secondary | ICD-10-CM

## 2019-04-29 DIAGNOSIS — L409 Psoriasis, unspecified: Secondary | ICD-10-CM

## 2019-04-29 DIAGNOSIS — Z6823 Body mass index (BMI) 23.0-23.9, adult: Secondary | ICD-10-CM

## 2019-04-29 DIAGNOSIS — I700 Atherosclerosis of aorta: Secondary | ICD-10-CM

## 2019-04-29 DIAGNOSIS — G4700 Insomnia, unspecified: Secondary | ICD-10-CM

## 2019-04-29 DIAGNOSIS — J439 Emphysema, unspecified: Secondary | ICD-10-CM

## 2019-04-29 DIAGNOSIS — Z0001 Encounter for general adult medical examination with abnormal findings: Secondary | ICD-10-CM

## 2019-04-29 DIAGNOSIS — Z79899 Other long term (current) drug therapy: Secondary | ICD-10-CM

## 2019-04-29 MED ORDER — TRAZODONE HCL 50 MG PO TABS: 25.0000 mg | ORAL_TABLET | Freq: Every evening | ORAL | 2 refills | Status: DC | PRN

## 2019-04-29 NOTE — Patient Instructions (Addendum)
[name] [name] , Thank you for taking time to come for your Medicare Wellness Visit. I appreciate your ongoing commitment to your health goals. Please review the following plan we discussed and let me know if I can assist you in the future.   These are the goals we discussed: Goals    . Exercise 150 min/wk Moderate Activity    . Quit Smoking       This is a list of the screening recommended for you and due dates:  Health Maintenance  Topic Date Due  . Flu Shot  03/30/2019  . Mammogram  06/28/2019  . Colon Cancer Screening  01/24/2022  . Tetanus Vaccine  04/26/2025  . DEXA scan (bone density measurement)  Completed  .  Hepatitis C: One time screening is recommended by Center for Disease Control  (CDC) for  adults born from [birth year] through 1964.   Completed  . Pneumonia vaccines  Completed   Please message back with cream for psoriasis - may increase dose for short period to calm down flare and use lower dose for maintenance   NEW GUIDELINES FOR BENOZOS  New guidelines suggest the benzodiazepines are best short term, with prolonged use they lead to physical and psychological dependence. In addition, evidence suggest that for insomnia the effectiveness wanes in 4 weeks and the risks out weight their benefits. Use of these agents have been associated with dementia, falls, motor vehicle accidents and physical addiction. Decreasing these medication have been proven to show improvements in cognition, alertness, decrease of falls and daytime sedation.   We will start a slow taper, symptoms of withdrawal include, insomnia, anxiety, irritability, sweating and stomach or intestinal symptoms like diarrhea or nausea.       Psoriasis Psoriasis is a long-term (chronic) skin condition. It occurs because your immune system causes skin cells to form too quickly. As a result, too many skin cells grow and create raised, red patches (plaques) that often look silvery on your skin. Plaques may show up anywhere  on your body. They can be any size or shape. Symptoms of this condition range from mild to very severe. Psoriasis cannot be passed from one person to another (is not contagious). Sometimes, the symptoms go away and then come back again. What are the causes? The cause of psoriasis is not known, but certain factors can make the condition worse. These include:  Damage or trauma to the skin, such as cuts, scrapes, sunburn, and dryness.  Not enough exposure to sunlight.  Certain medicines.  Alcohol.  Tobacco use.  Stress.  Infections caused by bacteria or viruses. What increases the risk? You are more likely to develop this condition if you:  Have a family history of psoriasis.  Are obese.  Are 38-61 years old.  Are taking certain medicines. What are the signs or symptoms? There are different types of psoriasis. You can have more than one type of psoriasis during your life. The types are:  Plaque. This is the most common.  Guttate. This is also called eruptive psoriasis.  Inverse.  Pustular.  Erythrodermic.  Sebopsoriasis.  Psoriatic arthritis. Each type of psoriasis has different symptoms.  Plaque psoriasis symptoms include red, raised plaques with a silvery-white coating (scale). These plaques may be itchy. Your nails may be pitted and crumbly or fall off.  Guttate psoriasis symptoms include small red spots that often show up on your trunk, arms, and legs. These spots may develop after you have been sick, especially with strep throat.  Inverse  psoriasis symptoms include plaques in your underarm area, under your breasts, or on your genitals, groin, or buttocks.  Pustular psoriasis symptoms include pus-filled bumps that are painful, red, and swollen on the palms of your hands or the soles of your feet. You also may feel exhausted, feverish, weak, or have no appetite.  Erythrodermic psoriasis symptoms include bright red skin that may look burned. You may have a fast  heartbeat and a body temperature that is too high or too low. You may be itchy or in pain.  Sebopsoriasis symptoms include red plaques that have a greasy coating, and are often on your scalp, forehead, and face.  Psoriatic arthritis causes swollen, painful joints along with scaly skin plaques. How is this diagnosed? This condition is diagnosed based on your symptoms, family history, and a physical exam.  You may also be referred to a health care provider who specializes in skin diseases (dermatologist).  Your health care provider may remove a tissue sample (biopsy) for testing. How is this treated? There is no cure for this condition, but treatment can help manage it. Goals of treatment include:  Helping your skin heal.  Reducing itching and inflammation.  Slowing the growth of new skin cells.  Helping your immune system respond better to your skin. Treatment varies, depending on the severity of your condition. This condition may be treated by:  Creams or ointments to help with symptoms.  Ultraviolet ray exposure (light therapy or phototherapy). This may include natural sunlight or light therapy in a medical office.  Medicines (systemic therapy). These medicines can help your body better manage skin cell turnover and inflammation. Medicines may be given in the form of pills or injections. They may be used along with light therapy or ointments. You may also get antibiotic medicines if you have an infection. Follow these instructions at home: [place] your skin as needed. Only use moisturizers that have been approved by your health care provider.  Apply cool, wet cloths (cold compresses) to the affected areas.  Do not use a hot tub or take hot showers. Take lukewarm showers and baths.  Do not scratch your skin. Lifestyle   Do not use any products that contain nicotine or tobacco, such as cigarettes, e-cigarettes, and chewing tobacco. If you need help quitting, ask  your health care provider.  Use techniques for stress reduction, such as meditation or yoga.  Maintain a healthy weight. Follow instructions from your health care provider for weight control. These may include dietary restrictions.  Get safe exposure to the sun as told by your health care provider. This may include spending regular intervals of time outdoors in sunlight. Do not get sunburned.  Consider joining a psoriasis support group. Medicines  Take or use over-the-counter and prescription medicines only as told by your health care provider.  If you were prescribed an antibiotic medicine, take it as told by your health care provider. Do not stop using the antibiotic even if you start to feel better. Alcohol use  Limit how much you use: ? 0-1 drink a day for women. ? 0-2 drinks a day for men.  Be aware of how much alcohol is in your drink. In the [place] one drink equals one 12 oz bottle of beer (355 mL), one 5 oz glass of wine (148 mL), or one 1 oz glass of hard liquor (44 mL). General instructions  Keep a journal to help track what triggers an outbreak. Try to avoid any triggers.  See  a counselor if feelings of sadness, frustration, and hopelessness about your condition are interfering with your work and relationships.  Keep all follow-up visits as told by your health care provider. This is important. Contact a health care provider if:  You have a fever.  Your pain gets worse.  You have increasing redness or warmth in the affected areas.  You have new or worsening pain or stiffness in your joints.  Your nails start to break easily or pull away from the nail bed.  You feel depressed. Summary  Psoriasis is a long-term (chronic) skin condition. Patches (plaques) may show up anywhere on your body.  There is no cure for this condition, but treatment can help manage it. Treatment varies, depending on the severity of your condition.  Keep a journal to track what triggers an  outbreak. Try to avoid any triggers.  Take or use over-the-counter and prescription medicines only as told by your health care provider.  Keep all follow-up visits as told by your health care provider. This is important. This information is not intended to replace advice given to you by your health care provider. Make sure you discuss any questions you have with your health care provider. Document Released: 08/12/2000 Document Revised: 06/19/2018 Document Reviewed: 06/19/2018 Elsevier Patient Education  2020 [employer].

## 2019-04-30 LAB — COMPLETE METABOLIC PANEL WITH GFR
AG Ratio: 1.5 (calc) (ref 1.0–2.5)
ALT: 12 U/L (ref 6–29)
AST: 14 U/L (ref 10–35)
Albumin: 4.1 g/dL (ref 3.6–5.1)
Alkaline phosphatase (APISO): 63 U/L (ref 37–153)
BUN: 14 mg/dL (ref 7–25)
CO2: 29 mmol/L (ref 20–32)
Calcium: 9.5 mg/dL (ref 8.6–10.4)
Chloride: 106 mmol/L (ref 98–110)
Creat: 0.86 mg/dL (ref 0.60–0.93)
GFR, Est African American: 79 mL/min/{1.73_m2} (ref >=60)
GFR, Est Non African American: 68 mL/min/{1.73_m2} (ref >=60)
Globulin: 2.8 g/dL (calc) (ref 1.9–3.7)
Glucose, Bld: 87 mg/dL (ref 65–99)
Potassium: 4.9 mmol/L (ref 3.5–5.3)
Sodium: 139 mmol/L (ref 135–146)
Total Bilirubin: 0.6 mg/dL (ref 0.2–1.2)
Total Protein: 6.9 g/dL (ref 6.1–8.1)

## 2019-04-30 LAB — CBC WITH DIFFERENTIAL/PLATELET
Absolute Monocytes: 510 cells/uL (ref 200–950)
Basophils Absolute: 41 cells/uL (ref 0–200)
Basophils Relative: 0.5 %
Eosinophils Absolute: 300 cells/uL (ref 15–500)
Eosinophils Relative: 3.7 %
HCT: 36.8 % (ref 35.0–45.0)
Hemoglobin: 12.3 g/dL (ref 11.7–15.5)
Lymphs Abs: 2309 cells/uL (ref 850–3900)
MCH: 31.4 pg (ref 27.0–33.0)
MCHC: 33.4 g/dL (ref 32.0–36.0)
MCV: 93.9 fL (ref 80.0–100.0)
MPV: 9.4 fL (ref 7.5–12.5)
Monocytes Relative: 6.3 %
Neutro Abs: 4941 cells/uL (ref 1500–7800)
Neutrophils Relative %: 61 %
Platelets: 288 10*3/uL (ref 140–400)
RBC: 3.92 10*6/uL (ref 3.80–5.10)
RDW: 13.5 % (ref 11.0–15.0)
Total Lymphocyte: 28.5 %
WBC: 8.1 10*3/uL (ref 3.8–10.8)

## 2019-04-30 LAB — LIPID PANEL
Cholesterol: 178 mg/dL (ref <200)
HDL: 73 mg/dL (ref >=50)
LDL Cholesterol (Calc): 88 mg/dL (calc)
Non-HDL Cholesterol (Calc): 105 mg/dL (calc) (ref <130)
Total CHOL/HDL Ratio: 2.4 (calc) (ref <5.0)
Triglycerides: 81 mg/dL (ref <150)

## 2019-04-30 LAB — TSH: TSH: 0.97 mIU/L (ref 0.40–4.50)

## 2019-05-08 DIAGNOSIS — L400 Psoriasis vulgaris: Secondary | ICD-10-CM | POA: Diagnosis not present

## 2019-05-08 DIAGNOSIS — L814 Other melanin hyperpigmentation: Secondary | ICD-10-CM | POA: Diagnosis not present

## 2019-05-08 DIAGNOSIS — L821 Other seborrheic keratosis: Secondary | ICD-10-CM | POA: Diagnosis not present

## 2019-05-08 DIAGNOSIS — D225 Melanocytic nevi of trunk: Secondary | ICD-10-CM | POA: Diagnosis not present

## 2019-05-08 DIAGNOSIS — Z85828 Personal history of other malignant neoplasm of skin: Secondary | ICD-10-CM | POA: Diagnosis not present

## 2019-06-04 DIAGNOSIS — M25572 Pain in left ankle and joints of left foot: Secondary | ICD-10-CM | POA: Diagnosis not present

## 2019-06-04 DIAGNOSIS — M2550 Pain in unspecified joint: Secondary | ICD-10-CM | POA: Diagnosis not present

## 2019-06-04 DIAGNOSIS — Z6824 Body mass index (BMI) 24.0-24.9, adult: Secondary | ICD-10-CM | POA: Diagnosis not present

## 2019-06-04 DIAGNOSIS — R5383 Other fatigue: Secondary | ICD-10-CM | POA: Diagnosis not present

## 2019-06-05 DIAGNOSIS — M25511 Pain in right shoulder: Secondary | ICD-10-CM | POA: Diagnosis not present

## 2019-06-18 DIAGNOSIS — Z6824 Body mass index (BMI) 24.0-24.9, adult: Secondary | ICD-10-CM | POA: Diagnosis not present

## 2019-06-18 DIAGNOSIS — L4059 Other psoriatic arthropathy: Secondary | ICD-10-CM | POA: Diagnosis not present

## 2019-07-09 ENCOUNTER — Other Ambulatory Visit: Payer: Self-pay | Admitting: Adult Health

## 2019-07-15 NOTE — Progress Notes (Signed)
Assessment and Plan:  [name] [name] was seen today for acute visit.  Diagnoses and all orders for this visit:  Acute rhinitis Discussed mild antihistamine, such as Claritin to help dry.  Internal nasal lesion -     mupirocin cream (BACTROBAN) 2 %; Apply 1 application topically 3 (three) times daily. For 7-14 days -     MRSA nasal swab Consider oral abx if no improvement with ointment.   Patient is agrees with plan of care.  Contact office with any new or worsening symptoms.   Further disposition pending results of labs. Discussed med's effects and SE's.   Over 30 minutes of exam, counseling, chart review, and critical decision making was performed.   Future Appointments  Date Time Provider [place]  07/16/2019 11:00 AM [name], NP [facility] None  08/01/2019  2:00 PM Vicie Mutters, PA-C [facility] None  08/19/2019  1:30 PM Isaiah Serge, NP [facility]  05/05/2020  3:00 PM Liane Comber, NP [facility] None    ------------------------------------------------------------------------------------------------------------------   HPI 73 y.o.female presents for evaluation fo scabs inside her nose with runny nose.  She reports this started two weeks ago.  She noticed white heads inside her nose with burning and itching.  She reports tenderness and she is having clear nasal drainage.  She reports even touching the outside of her nose it is sensitive.  She denies any headaches, otalgia, popping.crackin in ears, sore throat, congestion, epistaxis, cough or shortness of breath.  She has not tried taking anything for this.   Past Medical History:  Diagnosis Date  . Anemia   . Anxiety   . Arthritis    hands and spine  . Blood transfusion without reported diagnosis   . Cervical dysplasia 1976  . Diverticulitis of colon (without mention of hemorrhage)(562.11)   . Diverticulosis of colon (without mention of hemorrhage)   . Elevated cholesterol   . GERD  (gastroesophageal reflux disease)   . Heart murmur   . Hypertension    pt states she takes med for tachycardia NOT HTN -metoprolol taken for tachycardia  . Internal hemorrhoids without mention of complication   . Mitral valve disorders(424.0)   . MRSA infection   . Osteoporosis    spine  . Palpitations   . Pelvic adhesive disease   . Plaque psoriasis   . Prediabetes   . Tachycardia    never had HTN  . Vitamin D deficiency      Allergies  Allergen Reactions  . Bactrim [Sulfamethoxazole-Trimethoprim] Swelling    Face and eyes swollen and red.  Marland Kitchen Ketek [Telithromycin]     Unknown    Current Outpatient Medications on File Prior to Visit  Medication Sig  . aspirin EC 81 MG tablet Take 81 mg by mouth daily.  Marland Kitchen atorvastatin (LIPITOR) 40 MG tablet Take 1 tablet 3 x /week for Cholesterol  . B Complex Vitamins (B COMPLEX PO) Take 1 tablet by mouth every other day.   . Cholecalciferol (VITAMIN D3) 5000 UNITS TABS Take 5,000 Units by mouth 2 (two) times daily.   Marland Kitchen LORazepam (ATIVAN) 1 MG tablet Take 1/2-1 tablet at Bedtime ONLY if  needed for Sleep &  limit to 5 days /week to avoid addiction  . Magnesium 250 MG TABS Take 250 mg by mouth daily.   . metoprolol tartrate (LOPRESSOR) 25 MG tablet TAKE ONE TABLET BY MOUTH DAILY  . sertraline (ZOLOFT) 100 MG tablet Take 1 tablet Daily for Mood  . traZODone (DESYREL) 50 MG tablet Take  0.5-2 tablets (25-100 mg total) by mouth at bedtime as needed. For mood and sleep.   No current facility-administered medications on file prior to visit.     ROS: all negative except above.   Physical Exam:  There were no vitals taken for this visit.  General Appearance: Well nourished, in no apparent distress. Wearing mask Eyes: PERRLA, EOMs, conjunctiva no swelling or erythema Sinuses: No Frontal/maxillary tenderness ENT/Mouth: Ext aud canals clear, TMs without erythema, bulging. No erythema, swelling, or exudate on post pharynx.  Tonsils not swollen or  erythematous. Hearing normal. Nasal turbinates erythematous, bulging right more pronounced than left. External tenderness to palpation bilateral nares. Neck: Supple, thyroid normal.  Respiratory: Respiratory effort normal, BS equal bilaterally without rales, rhonchi, wheezing or stridor.  Cardio: RRR with no MRGs. Brisk peripheral pulses without edema.  Lymphatics: Non tender without lymphadenopathy.  Musculoskeletal: Full ROM, 5/5 strength, normal gait.  Skin: Warm, dry without rashes, lesions, ecchymosis.  Neuro: Cranial nerves intact. Normal muscle tone, no cerebellar symptoms. Sensation intact.  Psych: Awake and oriented X 3, normal affect, Insight and Judgment appropriate.     [name], NP 11:15 AM [facility]

## 2019-07-16 ENCOUNTER — Encounter: Payer: Self-pay | Admitting: Adult Health Nurse Practitioner

## 2019-07-16 ENCOUNTER — Other Ambulatory Visit: Payer: Self-pay

## 2019-07-16 ENCOUNTER — Ambulatory Visit (INDEPENDENT_AMBULATORY_CARE_PROVIDER_SITE_OTHER): Payer: PPO | Admitting: Adult Health Nurse Practitioner

## 2019-07-16 VITALS — BP 128/74 | HR 80 | Temp 97.3°F | Wt 142.2 lb

## 2019-07-16 DIAGNOSIS — J3489 Other specified disorders of nose and nasal sinuses: Secondary | ICD-10-CM

## 2019-07-16 DIAGNOSIS — J00 Acute nasopharyngitis [common cold]: Secondary | ICD-10-CM

## 2019-07-16 MED ORDER — MUPIROCIN CALCIUM 2 % EX CREA: 1.0000 "application " | TOPICAL_CREAM | Freq: Three times a day (TID) | CUTANEOUS | 2 refills | Status: DC

## 2019-07-16 NOTE — Patient Instructions (Signed)
Mupirocin nasal ointment What is this medicine? MUPIROCIN CALCIUM (myoo PEER oh sin KAL see um) is an antibiotic. It is used inside the nose to treat infections that are caused by certain bacteria. This helps prevent the spread of infection to patients and health care workers during outbreaks at institutions. This medicine may be used for other purposes; ask your health care provider or pharmacist if you have questions. COMMON BRAND NAME(S): Bactroban What should I tell my health care provider before I take this medicine? They need to know if you have any of these conditions:  an unusual or allergic reaction to mupirocin, other medicines, foods, dyes, or preservatives  pregnant or trying to get pregnant  breast-feeding How should I use this medicine? This medicine is only for use inside the nose. Follow the directions on the prescription label. Wash your hands before and after use. Squeeze half the contents of a single-use tube into one nostril, then squeeze the other half into the other nostril. Press the sides of your nose together and gently massage after application to spread the ointment throughout the nostrils. Do not use your medicine more often than directed. Finish the full course of medicine prescribed by your doctor or health care professional even if you think your condition is better. Talk to your pediatrician regarding the use of this medicine in children. Special care may be needed. Overdosage: If you think you have taken too much of this medicine contact a poison control center or emergency room at once. NOTE: This medicine is only for you. Do not share this medicine with others. What if I miss a dose? If you miss a dose, take it as soon as you can. If it is almost time for your next dose, take only that dose. Do not take double or extra doses. What may interact with this medicine? Interactions are not expected. Do not use any other nose products without telling your doctor or  health care professional. This list may not describe all possible interactions. Give your health care provider a list of all the medicines, herbs, non-prescription drugs, or dietary supplements you use. Also tell them if you smoke, drink alcohol, or use illegal drugs. Some items may interact with your medicine. What should I watch for while using this medicine? If your nose is severely irritated, burning or stinging from use of this medicine, stop using it and contact your doctor or health care professional. Do not get this medicine in your eyes. If you do, rinse out with plenty of cool tap water. What side effects may I notice from receiving this medicine? Side effects that you should report to your doctor or health care professional as soon as possible:  severe irritation, burning, stinging, or pain Side effects that usually do not require medical attention (report to your doctor or health care professional if they continue or are bothersome):  altered taste  cough  headache  skin itching  sore throat  stuffy or runny nose This list may not describe all possible side effects. Call your doctor for medical advice about side effects. You may report side effects to FDA at 1-800-FDA-1088. Where should I keep my medicine? Keep out of the reach of children. Store at room temperature between 15 and 30 degrees C (59 and 86 degrees F). Do not refrigerate. One tube of ointment is for single use in both nostrils. Throw away after use. NOTE: This sheet is a summary. It may not cover all possible information. If you have  questions about this medicine, talk to your doctor, pharmacist, or health care provider.  2020 Elsevier/Gold Standard (2008-03-03 14:36:10)     MRSA Infection, Diagnosis, Adult Methicillin-resistant Staphylococcus aureus (MRSA) infection is caused by bacteria called Staphylococcus aureus, or staph, that no longer respond to common antibiotic medicines (drug-resistant bacteria).  MRSA infection can be hard to treat. Most of the time, MRSA can be on the skin or in the nose without causing problems (colonized). However, if MRSA enters the body through a cut, a sore, or an invasive medical device, it can cause a serious infection. What are the causes? This condition is caused by staph bacteria. Illness may develop after exposure to the bacteria through:  Skin-to-skin contact with someone who is infected with MRSA.  Touching surfaces that have the bacteria on them.  Having a procedure or using equipment that allows MRSA to enter the body.  Having MRSA that lives on your skin and then enters your body through: ? A cut or scratch. ? A surgery or procedure. ? The use of a medical device. Contact with the bacteria may occur:  During a stay in a hospital, rehabilitation facility, nursing home, or other health care facility (health care-associated MRSA).  In daily activities where there is close contact with others, such as sports, child care centers, or at home (community-associated MRSA). What increases the risk? You are more likely to develop this condition if you:  Have a surgery or procedure.  Have an IV or a thin tube (catheter) placed in your body.  Are elderly.  Are on kidney dialysis.  Have recently taken an antibiotic medicine.  Live in a long-term care facility.  Have a chronic wound or skin ulcer.  Have a weak body defense system (immune system).  Play sports that involve skin-to-skin contact.  Live in a crowded place, like a dormitory or [employer]  Share towels, razors, or sports equipment with other people.  Have a history of MRSA infection or colonization. What are the signs or symptoms? Symptoms of this condition depend on the area that is affected. Symptoms may include:  A pus-filled pimple or boil.  Pus that drains from your skin.  A sore (abscess) under your skin or somewhere in your body.  Fever with or without  chills.  Difficulty breathing.  Coughing up blood.  Redness, warmth, swelling, or pain in the affected area. How is this diagnosed? This condition may be diagnosed based on:  A physical exam.  Your medical history.  Taking a sample from the infected area and growing it in a lab (culture). You may also have other tests, including:  Imaging tests, such as X-rays, a CT scan, or an MRI.  Lab tests, such as blood, urine, or phlegm (sputum) tests. You skin or nose may be swabbed when you are admitted to a health care facility for a procedure. This is to screen for MRSA. How is this treated? Treatment depends on the type of MRSA infection you have and how severe, deep, or extensive it is. Treatment may include:  Antibiotic medicines.  Surgery to drain pus from the infected area. Severe infections may require a hospital stay. Follow these instructions at home: Medicines  Take over-the-counter and prescription medicines only as told by your health care provider.  If you were prescribed an antibiotic medicine, use it as told by your health care provider. Do not stop using the antibiotic even if you start to feel better. Prevention Follow these instructions to avoid spreading the  infection to others:  [employer] frequently with soap and water. If soap and water are not available, use an alcohol-based hand sanitizer.  Avoid close contact with those around you as much as possible. Do not use towels, razors, toothbrushes, bedding, or other items that will be used by others.  Wash towels, bedding, and clothes in the washing machine with detergent and hot water. Dry them in a hot dryer.  Clean surfaces regularly to remove germs (disinfection). Use products or solutions that contain bleach. Make sure you disinfect bathroom surfaces, food preparation areas, exercise equipment, and doorknobs.  General instructions  If you have a wound, follow instructions from your health care  provider about how to take care of your wound. ? Do not pick at scabs. ? Do not try to drain any infection sites or pimples.  Tell all your health care providers that you have MRSA, or if you have ever had a MRSA infection.  Keep all follow-up visits as told by your health care provider. This is important. Contact a health care provider if you:  Do not get better.  Have symptoms that get worse.  Have new symptoms. Get help right away if you have:  Nausea or vomiting, or if you cannot take medicine without vomiting.  Trouble breathing.  Chest pain. These symptoms may represent a serious problem that is an emergency. Do not wait to see if the symptoms will go away. Get medical help right away. Call your local emergency services (911 in the [place] Do not drive yourself to the hospital. Summary  MRSA infection is caused by bacteria called Staphylococcus aureus, or staph, that no longer respond to common antibiotic medicines.  Treatment for this condition depends on the type of MRSA infection you have and how severe, deep, and extensive it is.  If you were prescribed an antibiotic medicine, use it as told by your health care provider. Do not stop using the antibiotic even if you start to feel better.  Follow instructions from your health care provider to avoid spreading the infection to others. This information is not intended to replace advice given to you by your health care provider. Make sure you discuss any questions you have with your health care provider. Document Released: 08/15/2005 Document Revised: 11/01/2018 Document Reviewed: 11/02/2018 Elsevier Patient Education  [place].

## 2019-07-19 ENCOUNTER — Other Ambulatory Visit: Payer: Self-pay | Admitting: Adult Health Nurse Practitioner

## 2019-07-19 DIAGNOSIS — B958 Unspecified staphylococcus as the cause of diseases classified elsewhere: Secondary | ICD-10-CM

## 2019-07-19 LAB — NASAL CULTURE (N/P)
MICRO NUMBER:: 1111294
SPECIMEN QUALITY:: ADEQUATE

## 2019-07-19 MED ORDER — CEPHALEXIN 500 MG PO CAPS: ORAL_CAPSULE | ORAL | 0 refills | Status: DC

## 2019-07-23 ENCOUNTER — Other Ambulatory Visit: Payer: Self-pay | Admitting: Internal Medicine

## 2019-07-23 ENCOUNTER — Other Ambulatory Visit: Payer: Self-pay | Admitting: Adult Health Nurse Practitioner

## 2019-07-23 ENCOUNTER — Other Ambulatory Visit: Payer: Self-pay | Admitting: Physician Assistant

## 2019-08-01 ENCOUNTER — Ambulatory Visit (INDEPENDENT_AMBULATORY_CARE_PROVIDER_SITE_OTHER): Payer: PPO | Admitting: Physician Assistant

## 2019-08-01 ENCOUNTER — Other Ambulatory Visit: Payer: Self-pay

## 2019-08-01 ENCOUNTER — Encounter: Payer: Self-pay | Admitting: Physician Assistant

## 2019-08-01 VITALS — BP 116/60 | HR 64 | Temp 97.3°F | Ht 64.0 in | Wt 140.0 lb

## 2019-08-01 DIAGNOSIS — K5732 Diverticulitis of large intestine without perforation or abscess without bleeding: Secondary | ICD-10-CM

## 2019-08-01 DIAGNOSIS — Z0001 Encounter for general adult medical examination with abnormal findings: Secondary | ICD-10-CM

## 2019-08-01 DIAGNOSIS — Z136 Encounter for screening for cardiovascular disorders: Secondary | ICD-10-CM

## 2019-08-01 DIAGNOSIS — E559 Vitamin D deficiency, unspecified: Secondary | ICD-10-CM | POA: Diagnosis not present

## 2019-08-01 DIAGNOSIS — L4050 Arthropathic psoriasis, unspecified: Secondary | ICD-10-CM

## 2019-08-01 DIAGNOSIS — Z79899 Other long term (current) drug therapy: Secondary | ICD-10-CM | POA: Diagnosis not present

## 2019-08-01 DIAGNOSIS — J439 Emphysema, unspecified: Secondary | ICD-10-CM

## 2019-08-01 DIAGNOSIS — I700 Atherosclerosis of aorta: Secondary | ICD-10-CM

## 2019-08-01 DIAGNOSIS — G609 Hereditary and idiopathic neuropathy, unspecified: Secondary | ICD-10-CM

## 2019-08-01 DIAGNOSIS — R7309 Other abnormal glucose: Secondary | ICD-10-CM | POA: Diagnosis not present

## 2019-08-01 DIAGNOSIS — I10 Essential (primary) hypertension: Secondary | ICD-10-CM | POA: Diagnosis not present

## 2019-08-01 DIAGNOSIS — M47812 Spondylosis without myelopathy or radiculopathy, cervical region: Secondary | ICD-10-CM

## 2019-08-01 DIAGNOSIS — G4700 Insomnia, unspecified: Secondary | ICD-10-CM

## 2019-08-01 DIAGNOSIS — E782 Mixed hyperlipidemia: Secondary | ICD-10-CM | POA: Diagnosis not present

## 2019-08-01 DIAGNOSIS — Z0000 Encounter for general adult medical examination without abnormal findings: Secondary | ICD-10-CM | POA: Diagnosis not present

## 2019-08-01 DIAGNOSIS — I059 Rheumatic mitral valve disease, unspecified: Secondary | ICD-10-CM

## 2019-08-01 DIAGNOSIS — D649 Anemia, unspecified: Secondary | ICD-10-CM | POA: Diagnosis not present

## 2019-08-01 DIAGNOSIS — F3341 Major depressive disorder, recurrent, in partial remission: Secondary | ICD-10-CM

## 2019-08-01 DIAGNOSIS — F17200 Nicotine dependence, unspecified, uncomplicated: Secondary | ICD-10-CM

## 2019-08-01 DIAGNOSIS — L409 Psoriasis, unspecified: Secondary | ICD-10-CM

## 2019-08-01 NOTE — Progress Notes (Signed)
CPE and 3 month OV Assessment:   CPE 1 year  Atherosclerosis of aorta Control blood pressure, cholesterol, glucose, increase exercise.   Essential hypertension - continue medications, DASH diet, exercise and monitor at home. Call if greater than 130/80.  - CBC with Differential/Platelet - CMP/GFR  Emphysema STOP SMOKING, uses inhaler PRN with flares, rare  Abnormal glucose Discussed general issues about diabetes pathophysiology and management., Educational material distributed., Suggested low cholesterol diet., Encouraged aerobic exercise., Discussed foot care., Reminded to get yearly retinal exam.   Mixed hyperlipidemia -continue medications, check lipids, decrease fatty foods, increase activity.    Vitamin D deficiency Continue supplement  Cervical arthritis RICE, NSAIDS, continue follow up ortho   Diverticulitis of colon  follow up GI.   Mitral valve disorder No SOB, CP, edema, monitor  Medication management  Estrogen deficiency Dexa in 5 years  Depression  stress management techniques discussed, increase water, good sleep hygiene discussed, increase exercise, and increase veggies.  - will continue zoloft for now, We will wait until after the holidays and winter to try a different sleep medication like lunesta or amitriptyline.   Insomnia We will wait until after the holidays and winter to try a different sleep medication like lunesta or amitriptyline.  New guidelines suggest the benzodiazepines are best short term, with prolonged use they lead to physical and psychological dependence. In addition, evidence suggest that for insomnia the effectiveness wanes in 4 weeks and the risks out weight their benefits. Use of these agents have been associated with dementia, falls, motor vehicle accidents and physical addiction. Decreasing these medication have been proven to show improvements in cognition, alertness, decrease of falls and daytime sedation.   We will start a  slow taper after the holidays, symptoms of withdrawal include, insomnia, anxiety, irritability, sweating and stomach or intestinal symptoms like diarrhea or nausea.   Current smoker on some days Getting annual CT lung cancer screening, had 08/2018 Smoking cessation-  instruction/counseling given, counseled patient on the dangers of tobacco use, advised patient to stop smoking, and reviewed strategies to maximize success, patient not ready to quit at this time.   Psoriasis arthritis On methotrexate 2.5mg  6 pills, following with RA Avoid wine/ETOH  Future Appointments  Date Time Provider [place]  08/19/2019  1:30 PM Isaiah Serge, NP [facility]  05/05/2020  3:00 PM Liane Comber, NP [facility] None  08/03/2020  2:00 PM Vicie Mutters, PA-C [facility] None    Subjective:   [name] [name] is a 73 y.o. female who presents for CPE and 3 month follow up for HTN, chol, preDM, and depression.   Coming up on 1 year anniversary of mother's death; she reports overall doing fairly, has some down days but is able to recover. Husband with MS.   She is on Ativan for sleep and zoloft for depression; she takes 1/2 tab of lorazepam at night to assist with sleep quality. She is currently cutting back on dose to try to taper off but admits due to mood is back to taking 1 tab daily and will run out soon. She was prescribed 50mg  trazodone. She states she took 2 the first night and had loss of bladder control the next day so she did not take anymore. We will wait until after the holidays and winter to try a different sleep medication like lunesta or amitriptyline.   She is on psoriasis and psoriatic arthritis, seeing RA and started on methotrexate x 1 month with folic acid. She has right shoulder  pain, left ankle pain. States she could barely stand on her left ankle in the AM, has improved but still some pain with moving her ankle.   She smokes 1 pack of cigarettes/week - on the  weekend with a bottle of wine. Has emphysema by imaging, asymptomatic, could not tolerate anoro. Had low dose CT screening 09/07/2018.  Has albuterol and reports she uses seldom, <1 x/month.  She has aortic atherosclerosis via imaging seen again with low dose CT 08/2018.  Had MRI AB that showed renal cyst, benign.  Also shows mild sludge in gallbladder- she does not have any symptoms of gallbladder issues,  discussed symptoms to monitor for.    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  She denies chest pain, shortness of breath, dizziness.   She is on cholesterol medication (atorvastatin 40 mg three times/ week) and denies myalgias. Her cholesterol is at goal. The cholesterol last visit was:   Lab Results  Component Value Date   CHOL 178 04/29/2019   HDL 73 04/29/2019   LDLCALC 88 04/29/2019   TRIG 81 04/29/2019   CHOLHDL 2.4 04/29/2019   She has been working on diet and exercise for hx of prediabetes, and denies paresthesia of the feet, polydipsia, polyuria and visual disturbances. Last A1C in the office was:  Lab Results  Component Value Date   HGBA1C 5.6 01/17/2019    Lab Results  Component Value Date   GFRNONAA 68 04/29/2019   Patient is on Vitamin D supplement. Lab Results  Component Value Date   VD25OH 82 01/17/2019    Medication Review   Current Outpatient Medications (Cardiovascular):  .  atorvastatin (LIPITOR) 40 MG tablet, Take 1 tablet 3 x /week for Cholesterol .  metoprolol tartrate (LOPRESSOR) 25 MG tablet, TAKE  ONE TABLET BY MOUTH DAILY   Current Outpatient Medications (Analgesics):  .  methotrexate (RHEUMATREX) 2.5 MG tablet, Take 2.5 mg by mouth once a week. Per patient takes 6 tablets once a week------reported on NOV 17th 2020  Current Outpatient Medications (Hematological):  .  folic acid (FOLVITE) 1 MG tablet, Take 1 mg by mouth daily.  Current Outpatient Medications (Other):  Marland Kitchen  B Complex Vitamins (B COMPLEX PO), Take 1 tablet by mouth every other day.  .  Cholecalciferol (VITAMIN D3) 5000 UNITS TABS, Take 5,000 Units by mouth 2 (two) times daily.  Marland Kitchen  LORazepam (ATIVAN) 1 MG tablet, TAKE 0.5 TABLET BY MOUTH TO 1 TABLET BY MOUTH AT TIME OF SLEEP ONLY IF NEEDED .  Magnesium 250 MG TABS, Take 250 mg by mouth daily.  .  mupirocin cream (BACTROBAN) 2 %, Apply 1 application topically 3 (three) times daily. For 7-14 days .  sertraline (ZOLOFT) 100 MG tablet, Take 1 tablet Daily for Mood  Current Problems (verified) Patient Active Problem List   Diagnosis Date Noted  . Current smoker on some days 04/25/2019  . Depression, major, recurrent, in partial remission ([place]) 07/31/2018  .  Pulmonary emphysema ([place]) 06/13/2017  . Atherosclerosis of aorta ([place]) 06/13/2017  . Psoriasis 05/25/2016  . Hereditary and idiopathic peripheral neuropathy 05/25/2016  . Encounter for Medicare annual wellness exam 04/27/2015  . Insomnia 11/27/2014  . Cervical arthritis ([place]) 10/10/2014  . Medication management 10/02/2014  . Mixed hyperlipidemia 07/01/2014  . Hypertension   . Other abnormal glucose   . Vitamin D deficiency   . Mitral valve disorder 06/16/2009  . Diverticulitis of colon 05/06/2008    Screening Tests Immunization History  Administered Date(s) Administered  . DT 04/27/2015  . Influenza, High Dose Seasonal PF 07/10/2019  . Pneumococcal Conjugate-13 04/27/2015  . Pneumococcal Polysaccharide-23 12/22/2004, 11/24/2016  . Rabies, IM 05/04/2016, 05/07/2016  . Td 12/16/2003   Preventative  care: Last colonoscopy: 12/2016, 5 year follow up EGD 02/2015 Last mammogram: 05/2018 - will defer this year and get next year per patient  Last pap smear/pelvic exam: 11/2012 , 1 abnormal in her 20's, declines another DEXA: at OB/GYN-08/2015 normal Echo 2009 normal EF Ct AB 11/2014 Ct chest: 08/2018 CXR: 2018  MRI abd: 08/2018   Prior vaccinations: TD or Tdap: 2016 Influenza: 2020 Pneumococcal: 2018 Prevnar 13: 2016 Shingles/Zostavax: declines  Names of Other Physician/Practitioners you currently use: 1. [facility]- here for primary care 2. Dr. Sabra Heck, eye doctor, last visit 2020 3. Dr. Rona Ravens, dentist, last visit 2020, q18m 4. Dr. Renda Rolls, derm, last visit 2019, goes annually, has scheduled next week   Patient Care Team: Unk Pinto, MD as PCP - General (Internal Medicine) Josue Hector, MD as PCP - Cardiology (Cardiology) Justice Britain, MD as Consulting Physician (Orthopedic Surgery) Iran Planas, MD as Consulting Physician (Orthopedic Surgery) Thornell Sartorius, MD as Consulting Physician (Otolaryngology) Josue Hector, MD as Consulting Physician (Cardiology) Rana Snare, MD as Consulting Physician (Urology) Phineas Real, Belinda Block, MD as Consulting Physician (Gynecology) Renda Rolls, Jennefer Bravo, MD as Referring Physician (Dermatology) Marica Otter, OD (Optometry) Merrilee Jansky , DDS   Allergies Allergies  Allergen Reactions  . Bactrim [Sulfamethoxazole-Trimethoprim] Swelling    Face and eyes swollen and red.  Marland Kitchen Ketek [Telithromycin]     Unknown    SURGICAL HISTORY She  has a past surgical history that includes Tympanoplasty (Right); Rotator cuff repair (Left); Ectopic pregnancy surgery (1971, 1979); Hemorrhoid surgery; Oophorectomy; Gynecologic cryosurgery (1976); Abdominal hysterectomy (2000); Rotator cuff repair (Right, 2/15); Colonoscopy; Esophagogastroduodenoscopy; Upper gastrointestinal endoscopy; Ganglion cyst  excision (Right); and Finger surgery. FAMILY HISTORY Her family history includes Cirrhosis in her father; Colon cancer in her maternal grandmother; Colon polyps in her sister; Diabetes in her father and mother; Heart disease in her father, maternal grandfather, paternal grandfather, and paternal grandmother; Hyperlipidemia in her father; Hypertension in her father and mother; Lymphoma in her maternal grandmother; Melanoma in her mother; Stomach cancer in her maternal grandmother; Uterine cancer in her sister. SOCIAL HISTORY She  reports that she has been smoking cigarettes. She has smoked for the past 30.00 years. She has never used smokeless tobacco. She reports current alcohol use. She reports that she does not use drugs.   Objective:   Blood pressure 116/60, pulse 64, temperature (!) 97.3 F (36.3 C), height 5\' 4"  (1.626 m), weight 140 lb (63.5 kg), SpO2 95 %. Body mass index is 24.03 kg/m.  General appearance: alert, no distress, WD/WN,  female HEENT: normocephalic, sclerae anicteric, TMs pearly, nares patent, no discharge or erythema, pharynx normal, right inner nostril at septum with small tender nodule, non fluctuant  Oral cavity: MMM, no  lesions Neck: supple, no lymphadenopathy, no thyromegaly, no masses Heart: RRR, bradycardia, slightly holosystolic murmur, normal S1, S2 Lungs: CTA bilaterally, no wheezes, rhonchi, or rales Abdomen: +bs, soft, non tender, no hepatomegaly, no splenomegaly Musculoskeletal: nontender, no swelling, no obvious deformity Extremities: no edema, no cyanosis, no clubbing Pulses: 2+ symmetric, upper and lower extremities, normal cap refill Derm: warm, dry; she has plaque to right foot dorsum, approx 5 cm area, another to R lateral ankle Neurological: alert, oriented x 3, CN2-12 intact, strength normal upper extremities and lower extremities, sensation normal throughout, DTRs 2+ throughout, no cerebellar signs, gait normal Psychiatric: normal affect, behavior  normal, pleasant  Breast: defer [id number] Rectal: defer  Future Appointments  Date Time Provider [place]  08/19/2019  1:30 PM Isaiah Serge, NP [facility]  05/05/2020  3:00 PM Liane Comber, NP [facility] None  08/03/2020  2:00 PM Vicie Mutters, PA-C [facility] None     Vicie Mutters, PA-C   08/01/2019

## 2019-08-03 LAB — URINALYSIS, ROUTINE W REFLEX MICROSCOPIC
Bacteria, UA: NONE SEEN /HPF
Bilirubin Urine: NEGATIVE
Glucose, UA: NEGATIVE
Hyaline Cast: NONE SEEN /LPF
Ketones, ur: NEGATIVE
Leukocytes,Ua: NEGATIVE
Nitrite: NEGATIVE
Protein, ur: NEGATIVE
Specific Gravity, Urine: 1.009 (ref 1.001–1.03)
WBC, UA: NONE SEEN /HPF (ref 0–5)
pH: 6.5 (ref 5.0–8.0)

## 2019-08-03 LAB — COMPLETE METABOLIC PANEL WITH GFR
AG Ratio: 1.7 (calc) (ref 1.0–2.5)
ALT: 11 U/L (ref 6–29)
AST: 13 U/L (ref 10–35)
Albumin: 4.1 g/dL (ref 3.6–5.1)
Alkaline phosphatase (APISO): 67 U/L (ref 37–153)
BUN: 11 mg/dL (ref 7–25)
CO2: 27 mmol/L (ref 20–32)
Calcium: 9.5 mg/dL (ref 8.6–10.4)
Chloride: 103 mmol/L (ref 98–110)
Creat: 0.85 mg/dL (ref 0.60–0.93)
GFR, Est African American: 79 mL/min/{1.73_m2} (ref >=60)
GFR, Est Non African American: 68 mL/min/{1.73_m2} (ref >=60)
Globulin: 2.4 g/dL (calc) (ref 1.9–3.7)
Glucose, Bld: 88 mg/dL (ref 65–99)
Potassium: 4.4 mmol/L (ref 3.5–5.3)
Sodium: 138 mmol/L (ref 135–146)
Total Bilirubin: 0.5 mg/dL (ref 0.2–1.2)
Total Protein: 6.5 g/dL (ref 6.1–8.1)

## 2019-08-03 LAB — MICROALBUMIN / CREATININE URINE RATIO
Creatinine, Urine: 62 mg/dL (ref 20–275)
Microalb Creat Ratio: 3 mcg/mg creat (ref <30)
Microalb, Ur: 0.2 mg/dL

## 2019-08-03 LAB — CBC WITH DIFFERENTIAL/PLATELET
Absolute Monocytes: 421 cells/uL (ref 200–950)
Basophils Absolute: 28 cells/uL (ref 0–200)
Basophils Relative: 0.4 %
Eosinophils Absolute: 221 cells/uL (ref 15–500)
Eosinophils Relative: 3.2 %
HCT: 32.8 % — ABNORMAL LOW (ref 35.0–45.0)
Hemoglobin: 11.3 g/dL — ABNORMAL LOW (ref 11.7–15.5)
Lymphs Abs: 2367 cells/uL (ref 850–3900)
MCH: 33 pg (ref 27.0–33.0)
MCHC: 34.5 g/dL (ref 32.0–36.0)
MCV: 95.9 fL (ref 80.0–100.0)
MPV: 9.3 fL (ref 7.5–12.5)
Monocytes Relative: 6.1 %
Neutro Abs: 3864 cells/uL (ref 1500–7800)
Neutrophils Relative %: 56 %
Platelets: 270 10*3/uL (ref 140–400)
RBC: 3.42 10*6/uL — ABNORMAL LOW (ref 3.80–5.10)
RDW: 13.9 % (ref 11.0–15.0)
Total Lymphocyte: 34.3 %
WBC: 6.9 10*3/uL (ref 3.8–10.8)

## 2019-08-03 LAB — VITAMIN D 25 HYDROXY (VIT D DEFICIENCY, FRACTURES): Vit D, 25-Hydroxy: 64 ng/mL (ref 30–100)

## 2019-08-03 LAB — LIPID PANEL
Cholesterol: 173 mg/dL (ref <200)
HDL: 77 mg/dL (ref >=50)
LDL Cholesterol (Calc): 79 mg/dL (calc)
Non-HDL Cholesterol (Calc): 96 mg/dL (calc) (ref <130)
Total CHOL/HDL Ratio: 2.2 (calc) (ref <5.0)
Triglycerides: 83 mg/dL (ref <150)

## 2019-08-03 LAB — HEMOGLOBIN A1C
Hgb A1c MFr Bld: 5.6 % of total Hgb (ref <5.7)
Mean Plasma Glucose: 114 (calc)
eAG (mmol/L): 6.3 (calc)

## 2019-08-03 LAB — TEST AUTHORIZATION

## 2019-08-03 LAB — MAGNESIUM: Magnesium: 2 mg/dL (ref 1.5–2.5)

## 2019-08-03 LAB — TSH: TSH: 1.33 mIU/L (ref 0.40–4.50)

## 2019-08-03 LAB — IRON: Iron: 98 ug/dL (ref 45–160)

## 2019-08-15 NOTE — Progress Notes (Signed)
Cardiology Office Note   Date:  08/19/2019   ID:  Keon, Waltermire 1946-05-22, MRN [id number]  PCP:  Unk Pinto, MD  Cardiologist:  Dr. Johnsie Cancel, MD   Chief Complaint  Patient presents with  . Follow-up   History of Present Illness: [name] [name] is a 73 y.o. female who presents for 1 year follow up, seen for Dr. Johnsie Cancel.   Ms. Diers has a hx of palpitations with PACs, tobacco use, HTN, HLD and pre-diabetes. She was last seen by Dr. Johnsie Cancel 07/2018 and was doing well from a cardiac standpoint. Was having some orthopedic issues and her mom had died early last year>>needing more structure in her life.   Today she presents for follow up and and is doing very well from a cardiac perspective.  Will occasionally have a brief episode of palpitations after eating however resolves on its own.  Is tolerating metoprolol well.  BP is very stable today.  Has been treated over the course of this past year for rheumatologist for plaque psoriasis and is now on low-dose methotrexate once weekly.  She is tolerating this well.  Denies chest pain, palpitations, LE swelling, shortness of breath, presyncopal or syncopal episodes.  Reports no regular exercise however will consciously park at the back of the parking lot for extra exercise.    Past Medical History:  Diagnosis Date  . Anemia   . Anxiety   . Arthritis    hands and spine  . Blood transfusion without reported diagnosis   . Cervical dysplasia 1976  . Diverticulitis of colon (without mention of hemorrhage)(562.11)   . Diverticulosis of colon (without mention of hemorrhage)   . Elevated cholesterol   . GERD (gastroesophageal reflux disease)   . Heart murmur   . Hypertension    pt states she takes med for tachycardia NOT HTN -metoprolol taken for tachycardia  . Internal hemorrhoids without mention of complication   . Mitral valve disorders(424.0)   . MRSA infection   . Osteoporosis    spine  . Palpitations   . Pelvic  adhesive disease   . Plaque psoriasis   . Prediabetes   . Tachycardia    never had HTN  . Vitamin D deficiency     Past Surgical History:  Procedure Laterality Date  . ABDOMINAL HYSTERECTOMY  2000   TAH,BSO endometriosis, leiomyomata  . COLONOSCOPY    . [place]  . ESOPHAGOGASTRODUODENOSCOPY    . FINGER SURGERY    . GANGLION CYST EXCISION Right    hand   . GYNECOLOGIC CRYOSURGERY  1976  . HEMORRHOID SURGERY    . OOPHORECTOMY    . ROTATOR CUFF REPAIR Left   . ROTATOR CUFF REPAIR Right 2/15  . TYMPANOPLASTY Right   . UPPER GASTROINTESTINAL ENDOSCOPY       Current Outpatient Medications  Medication Sig Dispense Refill  . atorvastatin (LIPITOR) 40 MG tablet Take 1 tablet 3 x /week for Cholesterol 38 tablet 3  . B Complex Vitamins (B COMPLEX PO) Take 1 tablet by mouth every other day.     . Cholecalciferol (VITAMIN D3) 5000 UNITS TABS Take 5,000 Units by mouth 2 (two) times daily.     . folic acid (FOLVITE) 1 MG tablet Take 1 mg by mouth daily.    Marland Kitchen LORazepam (ATIVAN) 1 MG tablet TAKE 0.5 TABLET BY MOUTH TO 1 TABLET BY MOUTH AT TIME OF SLEEP ONLY IF NEEDED 90 tablet 0  . Magnesium  250 MG TABS Take 250 mg by mouth daily.     . methotrexate (RHEUMATREX) 2.5 MG tablet Take 2.5 mg by mouth once a week. Per patient takes 6 tablets once a week------reported on NOV 17th 2020    . metoprolol tartrate (LOPRESSOR) 25 MG tablet TAKE ONE TABLET BY MOUTH DAILY 90 tablet 3  . mupirocin cream (BACTROBAN) 2 % Apply 1 application topically 3 (three) times daily. For 7-14 days 30 g 2  . sertraline (ZOLOFT) 100 MG tablet Take 1 tablet Daily for Mood 90 tablet 3   No current facility-administered medications for this visit.    Allergies:   Bactrim [sulfamethoxazole-trimethoprim] and Ketek [telithromycin]    Social History:  The patient  reports that she has been smoking cigarettes. She has smoked for the past 30.00 years. She has never used smokeless tobacco. She  reports current alcohol use. She reports that she does not use drugs.   Family History:  The patient's family history includes Cirrhosis in her father; Colon cancer in her maternal grandmother; Colon polyps in her sister; Diabetes in her father and mother; Heart disease in her father, maternal grandfather, paternal grandfather, and paternal grandmother; Hyperlipidemia in her father; Hypertension in her father and mother; Lymphoma in her maternal grandmother; Melanoma in her mother; Stomach cancer in her maternal grandmother; Uterine cancer in her sister.    ROS:  Please see the history of present illness. Otherwise, review of systems are positive for none.   All other systems are reviewed and negative.    PHYSICAL EXAM: VS:  There were no vitals taken for this visit. , BMI There is no height or weight on file to calculate BMI.   General: Well developed, well nourished, NAD Skin: Warm, dry, intact  Neck: Negative for carotid bruits. No JVD Lungs:Clear to ausculation bilaterally. No wheezes, rales, or rhonchi. Breathing is unlabored. Cardiovascular: RRR with S1 S2. No murmurs, rubs, gallops, or LV heave appreciated. Extremities: No edema. No clubbing or cyanosis. DP pulses 2+ bilaterally Neuro: Alert and oriented. No focal deficits. No facial asymmetry. MAE spontaneously. Psych: Responds to questions appropriately with normal affect.     EKG:  EKG is not ordered today.  Recent Labs: 08/01/2019: ALT 11; BUN 11; Creat 0.85; Hemoglobin 11.3; Magnesium 2.0; Platelets 270; Potassium 4.4; Sodium 138; TSH 1.33    Lipid Panel    Component Value Date/Time   CHOL 173 08/01/2019 1502   TRIG 83 08/01/2019 1502   HDL 77 08/01/2019 1502   CHOLHDL 2.2 08/01/2019 1502   VLDL 13 03/08/2017 1518   LDLCALC 79 08/01/2019 1502     Wt Readings from Last 3 Encounters:  08/01/19 140 lb (63.5 kg)  07/16/19 142 lb 3.2 oz (64.5 kg)  04/29/19 141 lb 3.2 oz (64 kg)    Other studies Reviewed: Additional  studies/ records that were reviewed today include:   None   ASSESSMENT AND PLAN:  1. Palpitations/PACs: -Benign>denies recent palpations -Continue BB  2. HLD: -LDL, 79 on 08/01/2019 -On statin 3x weekly  -AST/ALT WNL   3. HTN: -Stable, 122/70 -Continue current regimen with metoprolol 25mg  PO BID   Current medicines are reviewed at length with the patient today.  The patient does not have concerns regarding medicines.  The following changes have been made:  no change  Labs/ tests ordered today include: None  No orders of the defined types were placed in this encounter.   Disposition:   FU with Dr. Johnsie Cancel in 1 year  Signed,  Kathyrn Drown, NP  08/19/2019 1:22 PM    [facility] [address], [place], [place]  [address] Phone: [phone]; Fax: [phone]

## 2019-08-19 ENCOUNTER — Other Ambulatory Visit: Payer: Self-pay

## 2019-08-19 ENCOUNTER — Ambulatory Visit: Payer: PPO | Admitting: Cardiology

## 2019-08-19 ENCOUNTER — Encounter: Payer: Self-pay | Admitting: Cardiology

## 2019-08-19 VITALS — BP 122/70 | HR 64 | Ht 64.0 in | Wt 141.4 lb

## 2019-08-19 DIAGNOSIS — E782 Mixed hyperlipidemia: Secondary | ICD-10-CM | POA: Diagnosis not present

## 2019-08-19 DIAGNOSIS — I700 Atherosclerosis of aorta: Secondary | ICD-10-CM

## 2019-08-19 DIAGNOSIS — I10 Essential (primary) hypertension: Secondary | ICD-10-CM

## 2019-08-19 NOTE — Patient Instructions (Signed)
Medication Instructions:   Your physician recommends that you continue on your current medications as directed. Please refer to the Current Medication list given to you today.  *If you need a refill on your cardiac medications before your next appointment, please call your pharmacy*  Lab Work:  None ordered today  Testing/Procedures:  None ordered today  Follow-Up: At [facility], you and your health needs are our priority.  As part of our continuing mission to provide you with exceptional heart care, we have created designated Provider Care Teams.  These Care Teams include your primary Cardiologist (physician) and Advanced Practice Providers (APPs -  Physician Assistants and Nurse Practitioners) who all work together to provide you with the care you need, when you need it.  Your next appointment:   12 month(s)  The format for your next appointment:   In Person  Provider:   You may see Jenkins Rouge, MD or one of the following Advanced Practice Providers on your designated Care Team:    Truitt Merle, NP  Cecilie Kicks, NP  Kathyrn Drown, NP

## 2019-09-03 ENCOUNTER — Ambulatory Visit (INDEPENDENT_AMBULATORY_CARE_PROVIDER_SITE_OTHER): Payer: PPO

## 2019-09-03 ENCOUNTER — Other Ambulatory Visit: Payer: Self-pay

## 2019-09-03 VITALS — BP 108/60 | HR 72 | Temp 97.3°F | Wt 140.0 lb

## 2019-09-03 DIAGNOSIS — D649 Anemia, unspecified: Secondary | ICD-10-CM | POA: Diagnosis not present

## 2019-09-03 DIAGNOSIS — E538 Deficiency of other specified B group vitamins: Secondary | ICD-10-CM | POA: Diagnosis not present

## 2019-09-03 NOTE — Progress Notes (Signed)
Patient presents to the office for labs. No questions or concerns. Vitals taken and recorded.

## 2019-09-04 DIAGNOSIS — L4059 Other psoriatic arthropathy: Secondary | ICD-10-CM | POA: Diagnosis not present

## 2019-09-04 DIAGNOSIS — Z6824 Body mass index (BMI) 24.0-24.9, adult: Secondary | ICD-10-CM | POA: Diagnosis not present

## 2019-09-04 LAB — CBC WITH DIFFERENTIAL/PLATELET
Absolute Monocytes: 348 cells/uL (ref 200–950)
Basophils Absolute: 30 cells/uL (ref 0–200)
Basophils Relative: 0.4 %
Eosinophils Absolute: 118 cells/uL (ref 15–500)
Eosinophils Relative: 1.6 %
HCT: 34.2 % — ABNORMAL LOW (ref 35.0–45.0)
Hemoglobin: 11.4 g/dL — ABNORMAL LOW (ref 11.7–15.5)
Lymphs Abs: 1865 cells/uL (ref 850–3900)
MCH: 32 pg (ref 27.0–33.0)
MCHC: 33.3 g/dL (ref 32.0–36.0)
MCV: 96.1 fL (ref 80.0–100.0)
MPV: 9.3 fL (ref 7.5–12.5)
Monocytes Relative: 4.7 %
Neutro Abs: 5039 cells/uL (ref 1500–7800)
Neutrophils Relative %: 68.1 %
Platelets: 268 10*3/uL (ref 140–400)
RBC: 3.56 10*6/uL — ABNORMAL LOW (ref 3.80–5.10)
RDW: 14.1 % (ref 11.0–15.0)
Total Lymphocyte: 25.2 %
WBC: 7.4 10*3/uL (ref 3.8–10.8)

## 2019-09-04 LAB — IRON, TOTAL/TOTAL IRON BINDING CAP
%SAT: 23 % (calc) (ref 16–45)
Iron: 81 ug/dL (ref 45–160)
TIBC: 358 mcg/dL (calc) (ref 250–450)

## 2019-09-04 LAB — RETICULOCYTES
ABS Retic: 39160 cells/uL (ref 20000–8000)
Retic Ct Pct: 1.1 %

## 2019-09-04 LAB — FOLATE RBC: RBC Folate: 655 ng/mL RBC (ref >280)

## 2019-09-04 LAB — FERRITIN: Ferritin: 83 ng/mL (ref 16–288)

## 2019-09-04 LAB — VITAMIN B12: Vitamin B-12: 414 pg/mL (ref 200–1100)

## 2019-09-05 ENCOUNTER — Telehealth: Payer: Self-pay | Admitting: *Deleted

## 2019-09-05 NOTE — Telephone Encounter (Signed)
At the patient's request, 09/03/2019 labs faxed to [facility].

## 2019-10-03 DIAGNOSIS — Z23 Encounter for immunization: Secondary | ICD-10-CM | POA: Diagnosis not present

## 2019-10-03 DIAGNOSIS — B359 Dermatophytosis, unspecified: Secondary | ICD-10-CM | POA: Diagnosis not present

## 2019-10-11 ENCOUNTER — Ambulatory Visit: Payer: PPO | Attending: Internal Medicine

## 2019-10-11 DIAGNOSIS — Z23 Encounter for immunization: Secondary | ICD-10-CM | POA: Insufficient documentation

## 2019-10-11 NOTE — Progress Notes (Signed)
   Covid-19 Vaccination Clinic  Name:  [name] [name]    MRN: [id number] DOB: 1946-05-05  10/11/2019  [name] [name] was observed post Covid-19 immunization for 15 minutes without incidence. She was provided with Vaccine Information Sheet and instruction to access the V-Safe system.   [name] [name] was instructed to call 911 with any severe reactions post vaccine: Marland Kitchen Difficulty breathing  . Swelling of your face and throat  . A fast heartbeat  . A bad rash all over your body  . Dizziness and weakness    Immunizations Administered    Name Date Dose VIS Date Route   Pfizer COVID-19 Vaccine 10/11/2019  2:47 AM 0.3 mL 08/09/2019 Intramuscular   Manufacturer: [place]   Lot: [id number]   [place]: [id number]

## 2019-11-03 ENCOUNTER — Ambulatory Visit: Payer: PPO | Attending: Internal Medicine

## 2019-11-03 DIAGNOSIS — Z23 Encounter for immunization: Secondary | ICD-10-CM | POA: Insufficient documentation

## 2019-11-03 NOTE — Progress Notes (Signed)
   Covid-19 Vaccination Clinic  Name:  [name] [name]    MRN: [id number] DOB: 1946/03/12  11/03/2019  [name] was observed post Covid-19 immunization for 15 minutes without incident. She was provided with Vaccine Information Sheet and instruction to access the V-Safe system.   [name] was instructed to call 911 with any severe reactions post vaccine: Marland Kitchen Difficulty breathing  . Swelling of face and throat  . A fast heartbeat  . A bad rash all over body  . Dizziness and weakness   Immunizations Administered    Name Date Dose VIS Date Route   Pfizer COVID-19 Vaccine 11/03/2019 12:45 PM 0.3 mL 08/09/2019 Intramuscular   Manufacturer: [place]   Lot: [id number]   [place]: [id number]

## 2019-11-07 DIAGNOSIS — L4059 Other psoriatic arthropathy: Secondary | ICD-10-CM | POA: Diagnosis not present

## 2019-11-07 DIAGNOSIS — Z6824 Body mass index (BMI) 24.0-24.9, adult: Secondary | ICD-10-CM | POA: Diagnosis not present

## 2019-11-27 ENCOUNTER — Other Ambulatory Visit: Payer: Self-pay

## 2019-11-27 ENCOUNTER — Encounter: Payer: Self-pay | Admitting: Adult Health

## 2019-11-27 ENCOUNTER — Ambulatory Visit (INDEPENDENT_AMBULATORY_CARE_PROVIDER_SITE_OTHER): Payer: PPO | Admitting: Adult Health

## 2019-11-27 VITALS — BP 112/64 | HR 60 | Temp 97.3°F | Ht 64.0 in | Wt 140.2 lb

## 2019-11-27 DIAGNOSIS — R5383 Other fatigue: Secondary | ICD-10-CM | POA: Diagnosis not present

## 2019-11-27 DIAGNOSIS — J029 Acute pharyngitis, unspecified: Secondary | ICD-10-CM | POA: Diagnosis not present

## 2019-11-27 LAB — POCT RAPID STREP A (OFFICE): Rapid Strep A Screen: NEGATIVE

## 2019-11-27 MED ORDER — PREDNISONE 20 MG PO TABS: ORAL_TABLET | ORAL | 0 refills | Status: DC

## 2019-11-27 MED ORDER — AZITHROMYCIN 250 MG PO TABS: ORAL_TABLET | ORAL | 1 refills | Status: AC

## 2019-11-27 NOTE — Patient Instructions (Signed)
If below labs are negative, recommend covid 19 testing. Recommend remain in home in the interim.    -Make sure you are drinking plenty of fluids to stay hydrated.  -while drinking fluids pinch and hold nose close and swallow, to help open eustachian tubes and drain fluid behind ear drums. -you can do salt water gargles. You can also do 1 TSP liquid Maalox and 1 TSP liquid benadryl- mix/ gargle/ spit  If you are not feeling better in another 7 days days, then please call the office.   Pharyngitis Pharyngitis is redness, pain, and swelling (inflammation) of your pharynx.  CAUSES  Pharyngitis is usually caused by infection. Most of the time, these infections are from viruses (viral) and are part of a cold. However, sometimes pharyngitis is caused by bacteria (bacterial). Pharyngitis can also be caused by allergies. Viral pharyngitis may be spread from person to person by coughing, sneezing, and personal items or utensils (cups, forks, spoons, toothbrushes). Bacterial pharyngitis may be spread from person to person by more intimate contact, such as kissing.  SIGNS AND SYMPTOMS  Symptoms of pharyngitis include:   Sore throat.   Tiredness (fatigue).   Low-grade fever.   Headache.  Joint pain and muscle aches.  Skin rashes.  Swollen lymph nodes.  Plaque-like film on throat or tonsils (often seen with bacterial pharyngitis). DIAGNOSIS  Your health care provider will ask you questions about your illness and your symptoms. Your medical history, along with a physical exam, is often all that is needed to diagnose pharyngitis. Sometimes, a rapid strep test is done. Other lab tests may also be done, depending on the suspected cause.  TREATMENT  Viral pharyngitis will usually get better in 3-4 days without the use of medicine. Bacterial pharyngitis is treated with medicines that kill germs (antibiotics).  HOME CARE INSTRUCTIONS   Drink enough water and fluids to keep your urine  clear or pale yellow.   Only take over-the-counter or prescription medicines as directed by your health care provider:   If you are prescribed antibiotics, make sure you finish them even if you start to feel better.   Do not take aspirin.   Get lots of rest.   Gargle with 8 oz of salt water ( tsp of salt per 1 qt of water) as often as every 1-2 hours to soothe your throat.   Throat lozenges (if you are not at risk for choking) or sprays may be used to soothe your throat. SEEK MEDICAL CARE IF:   You have large, tender lumps in your neck.  You have a rash.  You cough up green, yellow-brown, or bloody spit. SEEK IMMEDIATE MEDICAL CARE IF:   Your neck becomes stiff.  You drool or are unable to swallow liquids.  You vomit or are unable to keep medicines or liquids down.  You have severe pain that does not go away with the use of recommended medicines.  You have trouble breathing (not caused by a stuffy nose). MAKE SURE YOU:   Understand these instructions.  Will watch your condition.  Will get help right away if you are not doing well or get worse. Document Released: 08/15/2005 Document Revised: 06/05/2013 Document Reviewed: 04/22/2013 [facility] Patient Information 2015 [place], [place]. This information is not intended to replace advice given to you by your health care provider. Make sure you discuss any questions you have with your health care provider.

## 2019-11-27 NOTE — Progress Notes (Signed)
Assessment and Plan:  [name] [name] was seen today for sore throat.  Diagnoses and all orders for this visit:  Acute pharyngitis, unspecified etiology with fatigue Will check basic labs, CBC, CMP for fatigue, strep A was negative, consider mono, check epstein barr; recommended isolation and covid 19 testing to r/o  Pharyngitis: take medicationss as prescribed, increase fluids,  Salt water gargles. Voice rest, sugar free throat lozenges. If symptoms are persistent or recurrent follow up with office.  Neg strep A, but discussed potential for false negatives, does not r/o strep B Will send in prednisone and zpak; initiate zpak if mono is negative and sx do not improve in next 2-3 days STOP SMOKING Follow up if sx persistent or recurrent after completing treatment -     POCT rapid strep A - negative  -     CBC with Differential/Platelet -     Epstein-Barr Virus VCA Antibody Panel -     COMPLETE METABOLIC PANEL WITH GFR -     azithromycin (ZITHROMAX) 250 MG tablet; Take 2 tablets (500 mg) on  Day 1,  followed by 1 tablet (250 mg) once daily on Days 2 through 5. -     predniSONE (DELTASONE) 20 MG tablet; 2 tablets daily for 3 days, 1 tablet daily for 4 days.  Further disposition pending results of labs. Discussed med's effects and SE's.   Over 15 minutes of exam, counseling, chart review, and critical decision making was performed.   Future Appointments  Date Time Provider [place]  12/02/2019  2:30 PM Unk Pinto, MD [facility] None  05/05/2020  3:00 PM Liane Comber, NP [facility] None  08/12/2020  2:00 PM Vicie Mutters, PA-C [facility] None    ------------------------------------------------------------------------------------------------------------------   HPI BP 112/64   Pulse 60   Temp (!) 97.3 F (36.3 C)   Ht 5\' 4"  (1.626 m)   Wt 140 lb 3.2 oz (63.6 kg)   SpO2 96%   BMI 24.07 kg/m   74 y.o.female with hx of emphysema, current some day smoker, presents for  evaluation of sore throat and fatigue.   She reports symptoms began 5-6 days ago, she reports symptoms began gradually. She reports primarily R sided sore throat, moderate severity, she noted fatigue/malaise, sleeping more than usual and exhausted. She reports has had some cough, but reports has at baseline, denies CP, dyspnea, wheezing, cough is non-productive. Has noted tender nodes in her R neck. Denies nasal congestion, sinus pressure/pain, HA. Has felt feverish but with consistently normal temps at home. Denies rash, arthralgias/myalgias, GI sx. Denies hx of allergies. She reports symptoms have been persistent and seem to be getting worse, has seen white plaques on back of throat, tenderness in neck (indicates R anterior cervical chain area).   She has taken nyquil to help with sleeping at night. Doesn't take antihistamine.   She has had both covid 19 vaccine, no known sick contacts, patient has been double masking, sticking to strict distancing, minimal interaction, always with mask on. Lives with husband who is NOT vaccinated, she admits he has been seeing friends and not taking precautions. She has not been tested for covid 19 since onset of sx.   She has psoriatic arthritis, on methotrexate.   Past Medical History:  Diagnosis Date  . Anemia   . Anxiety   . Arthritis    hands and spine  . Blood transfusion without reported diagnosis   . Cervical dysplasia 1976  . Diverticulitis of colon (without mention of hemorrhage)(562.11)   .  Diverticulosis of colon (without mention of hemorrhage)   . Elevated cholesterol   . GERD (gastroesophageal reflux disease)   . Heart murmur   . Hypertension    pt states she takes med for tachycardia NOT HTN -metoprolol taken for tachycardia  . Internal hemorrhoids without mention of complication   . Mitral valve disorders(424.0)   . MRSA infection   . Osteoporosis    spine  . Palpitations   . Pelvic adhesive disease   . Plaque psoriasis   .  Prediabetes   . Tachycardia    never had HTN  . Vitamin D deficiency      Allergies  Allergen Reactions  . Bactrim [Sulfamethoxazole-Trimethoprim] Swelling    Face and eyes swollen and red.  Marland Kitchen Ketek [Telithromycin]     Unknown    Current Outpatient Medications on File Prior to Visit  Medication Sig  . atorvastatin (LIPITOR) 40 MG tablet Take 1 tablet 3 x /week for Cholesterol  . B Complex Vitamins (B COMPLEX PO) Take 1 tablet by mouth every other day.   . Cholecalciferol (VITAMIN D3) 5000 UNITS TABS Take 5,000 Units by mouth 2 (two) times daily.   . folic acid (FOLVITE) 1 MG tablet Take 1 mg by mouth daily.  Marland Kitchen LORazepam (ATIVAN) 1 MG tablet TAKE 0.5 TABLET BY MOUTH TO 1 TABLET BY MOUTH AT TIME OF SLEEP ONLY IF NEEDED  . Magnesium 250 MG TABS Take 250 mg by mouth daily.   . methotrexate (RHEUMATREX) 2.5 MG tablet Take 2.5 mg by mouth once a week. Per patient takes 6 tablets once a week------reported on NOV 17th 2020  . metoprolol tartrate (LOPRESSOR) 25 MG tablet TAKE ONE TABLET BY MOUTH DAILY  . mupirocin cream (BACTROBAN) 2 % Apply 1 application topically 3 (three) times daily. For 7-14 days  . sertraline (ZOLOFT) 100 MG tablet Take 1 tablet Daily for Mood   No current facility-administered medications on file prior to visit.    ROS: all negative except above.   Physical Exam:  BP 112/64   Pulse 60   Temp (!) 97.3 F (36.3 C)   Ht 5\' 4"  (1.626 m)   Wt 140 lb 3.2 oz (63.6 kg)   SpO2 96%   BMI 24.07 kg/m   General Appearance: Well nourished, well dressed elder, appears to be feeling unwell though in no apparent distress. Eyes: PERRL, conjunctiva no swelling or erythema Sinuses: No Frontal/maxillary tenderness ENT/Mouth: Ext aud canals clear, TMs without erythema, bulging.She has generalized erythema of pharynx without swelling, or exudate on post pharynx.  Tonsils not swollen/visible. Uvula is midline. No white plaques visualized. Hearing normal.  Neck: Supple, thyroid  normal.  Respiratory: Respiratory effort normal, BS equal bilaterally without rales, rhonchi, wheezing or stridor.  Cardio: RRR with no MRGs. Brisk peripheral pulses without edema.  Abdomen: Soft, + BS.  Non tender, no guarding or palpable organomegaly.  Lymphatics: She has R anterior cervical chain tenderness without palpable lymphadenopathy.  Musculoskeletal: No obvious deformity, normal gait.  Skin: Warm, dry without rashes, lesions, ecchymosis.  Neuro:  Normal muscle tone. Psych: Awake and oriented X 3, normal affect, Insight and Judgment appropriate.     Izora Ribas, NP 2:18 PM [facility]

## 2019-11-28 LAB — CBC WITH DIFFERENTIAL/PLATELET
Absolute Monocytes: 371 cells/uL (ref 200–950)
Basophils Absolute: 28 cells/uL (ref 0–200)
Basophils Relative: 0.4 %
Eosinophils Absolute: 140 cells/uL (ref 15–500)
Eosinophils Relative: 2 %
HCT: 32.9 % — ABNORMAL LOW (ref 35.0–45.0)
Hemoglobin: 11.3 g/dL — ABNORMAL LOW (ref 11.7–15.5)
Lymphs Abs: 2114 cells/uL (ref 850–3900)
MCH: 33.5 pg — ABNORMAL HIGH (ref 27.0–33.0)
MCHC: 34.3 g/dL (ref 32.0–36.0)
MCV: 97.6 fL (ref 80.0–100.0)
MPV: 9.4 fL (ref 7.5–12.5)
Monocytes Relative: 5.3 %
Neutro Abs: 4347 cells/uL (ref 1500–7800)
Neutrophils Relative %: 62.1 %
Platelets: 247 10*3/uL (ref 140–400)
RBC: 3.37 10*6/uL — ABNORMAL LOW (ref 3.80–5.10)
RDW: 13.1 % (ref 11.0–15.0)
Total Lymphocyte: 30.2 %
WBC: 7 10*3/uL (ref 3.8–10.8)

## 2019-11-28 LAB — COMPLETE METABOLIC PANEL WITH GFR
AG Ratio: 2.2 (calc) (ref 1.0–2.5)
ALT: 10 U/L (ref 6–29)
AST: 13 U/L (ref 10–35)
Albumin: 4.4 g/dL (ref 3.6–5.1)
Alkaline phosphatase (APISO): 62 U/L (ref 37–153)
BUN: 14 mg/dL (ref 7–25)
CO2: 28 mmol/L (ref 20–32)
Calcium: 9.4 mg/dL (ref 8.6–10.4)
Chloride: 105 mmol/L (ref 98–110)
Creat: 0.88 mg/dL (ref 0.60–0.93)
GFR, Est African American: 76 mL/min/{1.73_m2} (ref >=60)
GFR, Est Non African American: 66 mL/min/{1.73_m2} (ref >=60)
Globulin: 2 g/dL (calc) (ref 1.9–3.7)
Glucose, Bld: 89 mg/dL (ref 65–99)
Potassium: 4.8 mmol/L (ref 3.5–5.3)
Sodium: 138 mmol/L (ref 135–146)
Total Bilirubin: 0.5 mg/dL (ref 0.2–1.2)
Total Protein: 6.4 g/dL (ref 6.1–8.1)

## 2019-11-28 LAB — EPSTEIN-BARR VIRUS VCA ANTIBODY PANEL
EBV NA IgG: 81.6 U/mL — ABNORMAL HIGH
EBV VCA IgG: 115 U/mL — ABNORMAL HIGH
EBV VCA IgM: <36 U/mL

## 2019-12-02 ENCOUNTER — Ambulatory Visit: Payer: PPO | Admitting: Internal Medicine

## 2019-12-04 ENCOUNTER — Ambulatory Visit: Payer: PPO | Attending: Internal Medicine

## 2019-12-04 DIAGNOSIS — Z20822 Contact with and (suspected) exposure to covid-19: Secondary | ICD-10-CM

## 2019-12-05 LAB — SARS-COV-2, NAA 2 DAY TAT

## 2019-12-05 LAB — NOVEL CORONAVIRUS, NAA: SARS-CoV-2, NAA: NOT DETECTED

## 2020-01-01 ENCOUNTER — Encounter: Payer: Self-pay | Admitting: Internal Medicine

## 2020-01-01 NOTE — Patient Instructions (Signed)

## 2020-01-01 NOTE — Progress Notes (Signed)
History of Present Illness:       This very nice 74 y.o. MWF  presents for 3 month follow up with HTN, HLD, Pre-Diabetes and Vitamin D Deficiency. Patient is on MTX for psoriasis & Psoriatic Arthritis.      Patient is treated for HTN  & palpitations circa 1998  & BP has been controlled at home. Today's BP is at goal - 124/82. Patient has had no complaints of any cardiac type chest pain, palpitations, dyspnea / orthopnea / PND, dizziness, claudication, or dependent edema.      Hyperlipidemia is controlled with diet & Atorvastatin.   Patient denies myalgias or other med SE's. Last Lipids were at goal:  Lab Results  Component Value Date   CHOL 173 08/01/2019   HDL 77 08/01/2019   LDLCALC 79 08/01/2019   TRIG 83 08/01/2019   CHOLHDL 2.2 08/01/2019    Also, the patient has history of PreDiabetes (A1c 6.0% / 2015)  and has had no symptoms of reactive hypoglycemia, diabetic polys, paresthesias or visual blurring.  Last A1c was Normal & at goal:  Lab Results  Component Value Date   HGBA1C 5.6 08/01/2019           Further, the patient also has history of Vitamin D Deficiency and supplements vitamin D without any suspected side-effects. Last vitamin D was at goal:  Lab Results  Component Value Date   VD25OH 64 08/01/2019    Current Outpatient Medications on File Prior to Visit  Medication Sig  . atorvastatin (LIPITOR) 40 MG tablet Take 1 tablet 3 x /week for Cholesterol  . B Complex Vitamins (B COMPLEX PO) Take 1 tablet by mouth every other day.   . Cholecalciferol (VITAMIN D3) 5000 UNITS TABS Take 5,000 Units by mouth 2 (two) times daily.   . folic acid (FOLVITE) 1 MG tablet Take 1 mg by mouth daily.  Marland Kitchen LORazepam (ATIVAN) 1 MG tablet TAKE 0.5 TABLET BY MOUTH TO 1 TABLET BY MOUTH AT TIME OF SLEEP ONLY IF NEEDED  . Magnesium 250 MG TABS Take 250 mg by mouth daily.   . methotrexate (RHEUMATREX) 2.5 MG tablet Take 2.5 mg by mouth once a week. Per patient takes 8 tablets once a  week------reported on 01/02/2020  . metoprolol tartrate (LOPRESSOR) 25 MG tablet TAKE ONE TABLET BY MOUTH DAILY  . sertraline (ZOLOFT) 100 MG tablet Take 1 tablet Daily for Mood   No current facility-administered medications on file prior to visit.    Allergies  Allergen Reactions  . Bactrim [Sulfamethoxazole-Trimethoprim] Swelling    Face and eyes swollen and red.  Marland Kitchen Ketek [Telithromycin]     Unknown    PMHx:   Past Medical History:  Diagnosis Date  . Anemia   . Anxiety   . Arthritis    hands and spine  . Blood transfusion without reported diagnosis   . Cervical dysplasia 1976  . Diverticulitis of colon (without mention of hemorrhage)(562.11)   . Diverticulosis of colon (without mention of hemorrhage)   . Elevated cholesterol   . GERD (gastroesophageal reflux disease)   . Heart murmur   . Hypertension    pt states she takes med for tachycardia NOT HTN -metoprolol taken for tachycardia  . Internal hemorrhoids without mention of complication   . Mitral valve disorders(424.0)   . MRSA infection   . Osteoporosis    spine  . Palpitations   . Pelvic adhesive disease   . Plaque psoriasis   .  Prediabetes   . Tachycardia    never had HTN  . Vitamin D deficiency     Immunization History  Administered Date(s) Administered  . DT (Pediatric) 04/27/2015  . Influenza, High Dose Seasonal PF 07/10/2019  . PFIZER SARS-COV-2 Vaccination 10/11/2019, 11/03/2019  . Pneumococcal Conjugate-13 04/27/2015  . Pneumococcal Polysaccharide-23 12/22/2004, 11/24/2016  . Rabies, IM 05/04/2016, 05/07/2016  . Td 12/16/2003    Past Surgical History:  Procedure Laterality Date  . ABDOMINAL HYSTERECTOMY  2000   TAH,BSO endometriosis, leiomyomata  . COLONOSCOPY    . [place]  . ESOPHAGOGASTRODUODENOSCOPY    . FINGER SURGERY    . GANGLION CYST EXCISION Right    hand   . GYNECOLOGIC CRYOSURGERY  1976  . HEMORRHOID SURGERY    . OOPHORECTOMY    . ROTATOR CUFF  REPAIR Left   . ROTATOR CUFF REPAIR Right 2/15  . TYMPANOPLASTY Right   . UPPER GASTROINTESTINAL ENDOSCOPY      FHx:    Reviewed / unchanged  SHx:    Reviewed / unchanged   Systems Review:  Constitutional: Denies fever, chills, wt changes, headaches, insomnia, fatigue, night sweats, change in appetite. Eyes: Denies redness, blurred vision, diplopia, discharge, itchy, watery eyes.  ENT: Denies discharge, congestion, post nasal drip, epistaxis, sore throat, earache, hearing loss, dental pain, tinnitus, vertigo, sinus pain, snoring.  CV: Denies chest pain, palpitations, irregular heartbeat, syncope, dyspnea, diaphoresis, orthopnea, PND, claudication or edema. Respiratory: denies cough, dyspnea, DOE, pleurisy, hoarseness, laryngitis, wheezing.  Gastrointestinal: Denies dysphagia, odynophagia, heartburn, reflux, water brash, abdominal pain or cramps, nausea, vomiting, bloating, diarrhea, constipation, hematemesis, melena, hematochezia  or hemorrhoids. Genitourinary: Denies dysuria, frequency, urgency, nocturia, hesitancy, discharge, hematuria or flank pain. Musculoskeletal: Denies arthralgias, myalgias, stiffness, jt. swelling, pain, limping or strain/sprain.  Skin: Denies pruritus, rash, hives, warts, acne, eczema or change in skin lesion(s). Neuro: No weakness, tremor, incoordination, spasms, paresthesia or pain. Psychiatric: Denies confusion, memory loss or sensory loss. Endo: Denies change in weight, skin or hair change.  Heme/Lymph: No excessive bleeding, bruising or enlarged lymph nodes.  Physical Exam  BP 124/82   Pulse 64   Temp (!) 96.7 F (35.9 C)   Resp 16   Ht 5\' 4"  (1.626 m)   Wt 137 lb 3.2 oz (62.2 kg)   SpO2 97%   BMI 23.55 kg/m   Appears  well nourished, well groomed  and in no distress.  Eyes: PERRLA, EOMs, conjunctiva no swelling or erythema. Sinuses: No frontal/maxillary tenderness ENT/Mouth: EAC's clear, TM's nl w/o erythema, bulging. Nares clear w/o  erythema, swelling, exudates. Oropharynx clear without erythema or exudates. Oral hygiene is good. Tongue normal, non obstructing. Hearing intact.  Neck: Supple. Thyroid not palpable. Car 2+/2+ without bruits, nodes or JVD. Chest: Respirations nl with BS clear & equal w/o rales, rhonchi, wheezing or stridor.  Cor: Heart sounds normal w/ regular rate and rhythm without sig. murmurs, gallops, clicks or rubs. Peripheral pulses normal and equal  without edema.  Abdomen: Soft & bowel sounds normal. Non-tender w/o guarding, rebound, hernias, masses or organomegaly.  Lymphatics: Unremarkable.  Musculoskeletal: Full ROM all peripheral extremities, joint stability, 5/5 strength and normal gait.  Skin: Warm, dry without exposed rashes, lesions or ecchymosis apparent.  Neuro: Cranial nerves intact, reflexes equal bilaterally. Sensory-motor testing grossly intact. Tendon reflexes grossly intact.  Pysch: Alert & oriented x 3.  Insight and judgement nl & appropriate. No ideations.  Assessment and Plan:  1. Labile hypertension  - Continue medication,  monitor blood pressure at home.  - Continue DASH diet.  Reminder to go to the ER if any CP,  SOB, nausea, dizziness, severe HA, changes vision/speech.  - CBC with Differential/Platelet - COMPLETE METABOLIC PANEL WITH GFR - Magnesium - TSH  2. Abnormal glucose  - Continue diet/meds, exercise,& lifestyle modifications.  - Continue monitor periodic cholesterol/liver & renal functions   - Hemoglobin A1c - Insulin, random  3. Hyperlipidemia, mixed  - Continue diet, exercise  - Lifestyle modifications.  - Monitor appropriate labs.   - Lipid panel - TSH  4. Vitamin D deficiency  - Continue supplementation.  - VITAMIN D 25 Hydrox  5. Medication management  - CBC with Differential/Platelet - COMPLETE METABOLIC PANEL WITH GFR - Magnesium - Lipid panel - TSH - Hemoglobin A1c - Insulin, random - VITAMIN D 25 Hydroxy        Discussed   regular exercise, BP monitoring, weight control to achieve/maintain BMI less than 25 and discussed med and SE's. Recommended labs to assess and monitor clinical status with further disposition pending results of labs.  I discussed the assessment and treatment plan with the patient. The patient was provided an opportunity to ask questions and all were answered. The patient agreed with the plan and demonstrated an understanding of the instructions.  I provided over 30 minutes of exam, counseling, chart review and  complex critical decision making.   Kirtland Bouchard, MD

## 2020-01-02 ENCOUNTER — Other Ambulatory Visit: Payer: Self-pay

## 2020-01-02 ENCOUNTER — Ambulatory Visit (INDEPENDENT_AMBULATORY_CARE_PROVIDER_SITE_OTHER): Payer: PPO | Admitting: Internal Medicine

## 2020-01-02 VITALS — BP 124/82 | HR 64 | Temp 96.7°F | Resp 16 | Ht 64.0 in | Wt 137.2 lb

## 2020-01-02 DIAGNOSIS — E559 Vitamin D deficiency, unspecified: Secondary | ICD-10-CM

## 2020-01-02 DIAGNOSIS — R0989 Other specified symptoms and signs involving the circulatory and respiratory systems: Secondary | ICD-10-CM | POA: Diagnosis not present

## 2020-01-02 DIAGNOSIS — Z79899 Other long term (current) drug therapy: Secondary | ICD-10-CM | POA: Diagnosis not present

## 2020-01-02 DIAGNOSIS — E782 Mixed hyperlipidemia: Secondary | ICD-10-CM

## 2020-01-02 DIAGNOSIS — R7309 Other abnormal glucose: Secondary | ICD-10-CM

## 2020-01-03 LAB — COMPLETE METABOLIC PANEL WITH GFR
AG Ratio: 1.9 (calc) (ref 1.0–2.5)
ALT: 13 U/L (ref 6–29)
AST: 16 U/L (ref 10–35)
Albumin: 4.2 g/dL (ref 3.6–5.1)
Alkaline phosphatase (APISO): 60 U/L (ref 37–153)
BUN: 12 mg/dL (ref 7–25)
CO2: 28 mmol/L (ref 20–32)
Calcium: 9.4 mg/dL (ref 8.6–10.4)
Chloride: 105 mmol/L (ref 98–110)
Creat: 0.77 mg/dL (ref 0.60–0.93)
GFR, Est African American: 89 mL/min/{1.73_m2} (ref >=60)
GFR, Est Non African American: 77 mL/min/{1.73_m2} (ref >=60)
Globulin: 2.2 g/dL (calc) (ref 1.9–3.7)
Glucose, Bld: 87 mg/dL (ref 65–99)
Potassium: 4.6 mmol/L (ref 3.5–5.3)
Sodium: 138 mmol/L (ref 135–146)
Total Bilirubin: 0.5 mg/dL (ref 0.2–1.2)
Total Protein: 6.4 g/dL (ref 6.1–8.1)

## 2020-01-03 LAB — CBC WITH DIFFERENTIAL/PLATELET
Absolute Monocytes: 306 cells/uL (ref 200–950)
Basophils Absolute: 33 cells/uL (ref 0–200)
Basophils Relative: 0.5 %
Eosinophils Absolute: 72 cells/uL (ref 15–500)
Eosinophils Relative: 1.1 %
HCT: 34.3 % — ABNORMAL LOW (ref 35.0–45.0)
Hemoglobin: 11.7 g/dL (ref 11.7–15.5)
Lymphs Abs: 2087 cells/uL (ref 850–3900)
MCH: 33.8 pg — ABNORMAL HIGH (ref 27.0–33.0)
MCHC: 34.1 g/dL (ref 32.0–36.0)
MCV: 99.1 fL (ref 80.0–100.0)
MPV: 9.3 fL (ref 7.5–12.5)
Monocytes Relative: 4.7 %
Neutro Abs: 4004 cells/uL (ref 1500–7800)
Neutrophils Relative %: 61.6 %
Platelets: 288 10*3/uL (ref 140–400)
RBC: 3.46 10*6/uL — ABNORMAL LOW (ref 3.80–5.10)
RDW: 13.4 % (ref 11.0–15.0)
Total Lymphocyte: 32.1 %
WBC: 6.5 10*3/uL (ref 3.8–10.8)

## 2020-01-03 LAB — LIPID PANEL
Cholesterol: 187 mg/dL (ref <200)
HDL: 72 mg/dL (ref >=50)
LDL Cholesterol (Calc): 96 mg/dL (calc)
Non-HDL Cholesterol (Calc): 115 mg/dL (calc) (ref <130)
Total CHOL/HDL Ratio: 2.6 (calc) (ref <5.0)
Triglycerides: 98 mg/dL (ref <150)

## 2020-01-03 LAB — HEMOGLOBIN A1C
Hgb A1c MFr Bld: 5.3 % of total Hgb (ref <5.7)
Mean Plasma Glucose: 105 (calc)
eAG (mmol/L): 5.8 (calc)

## 2020-01-03 LAB — MAGNESIUM: Magnesium: 2 mg/dL (ref 1.5–2.5)

## 2020-01-03 LAB — VITAMIN D 25 HYDROXY (VIT D DEFICIENCY, FRACTURES): Vit D, 25-Hydroxy: 81 ng/mL (ref 30–100)

## 2020-01-03 LAB — TSH: TSH: 1.91 mIU/L (ref 0.40–4.50)

## 2020-01-03 LAB — INSULIN, RANDOM: Insulin: 2.6 u[IU]/mL

## 2020-01-14 ENCOUNTER — Other Ambulatory Visit: Payer: Self-pay | Admitting: Adult Health Nurse Practitioner

## 2020-01-21 ENCOUNTER — Other Ambulatory Visit: Payer: Self-pay | Admitting: Cardiovascular Disease

## 2020-02-07 DIAGNOSIS — L4059 Other psoriatic arthropathy: Secondary | ICD-10-CM | POA: Diagnosis not present

## 2020-02-07 DIAGNOSIS — Z6823 Body mass index (BMI) 23.0-23.9, adult: Secondary | ICD-10-CM | POA: Diagnosis not present

## 2020-03-19 ENCOUNTER — Other Ambulatory Visit: Payer: Self-pay | Admitting: Internal Medicine

## 2020-04-02 DIAGNOSIS — R350 Frequency of micturition: Secondary | ICD-10-CM | POA: Diagnosis not present

## 2020-04-02 DIAGNOSIS — R3915 Urgency of urination: Secondary | ICD-10-CM | POA: Diagnosis not present

## 2020-04-02 DIAGNOSIS — N3946 Mixed incontinence: Secondary | ICD-10-CM | POA: Diagnosis not present

## 2020-04-16 DIAGNOSIS — L4059 Other psoriatic arthropathy: Secondary | ICD-10-CM | POA: Diagnosis not present

## 2020-04-16 DIAGNOSIS — Z6823 Body mass index (BMI) 23.0-23.9, adult: Secondary | ICD-10-CM | POA: Diagnosis not present

## 2020-04-16 DIAGNOSIS — M25572 Pain in left ankle and joints of left foot: Secondary | ICD-10-CM | POA: Diagnosis not present

## 2020-04-30 DIAGNOSIS — R3915 Urgency of urination: Secondary | ICD-10-CM | POA: Diagnosis not present

## 2020-04-30 DIAGNOSIS — R350 Frequency of micturition: Secondary | ICD-10-CM | POA: Diagnosis not present

## 2020-05-01 NOTE — Progress Notes (Signed)
Medicare Wellness Visit and 3 month OV Assessment:   Encounter for annual medicare wellness visit Schedule mammogram, phone number given Due annually  Atherosclerosis of aorta Control blood pressure, cholesterol, glucose, increase exercise.   Essential hypertension - continue medications, DASH diet, exercise and monitor at home. Call if greater than 130/80.  - CBC with Differential/Platelet - CMP/GFR  Emphysema STOP SMOKING, uses inhaler PRN with flares, rare  Hx of prediabetes Discussed general issues about diabetes pathophysiology and management., Educational material distributed., Suggested low cholesterol diet., Encouraged aerobic exercise., Discussed foot care., Reminded to get yearly retinal exam.   Mixed hyperlipidemia Continue statin -continue medications, check lipids, decrease fatty foods, increase activity.    Vitamin D deficiency Continue supplement  Psoriatic arthritis/Cervical arthritis ([place]) Dr. Amil Amen following, now on methotrexate    Diverticulosus of colon  Bowel management; add fiber if not taking   Mitral valve disorder No SOB, CP, edema, monitor  Medication management  Estrogen deficiency Dexa in 5 years  Depression  stress management techniques discussed, increase water, good sleep hygiene discussed, increase exercise, and increase veggies.  - will continue zoloft for now, successfully tapered off of benzo  Current smoker on some days Getting annual CT lung cancer screening, had 08/2018, nodule, follow up order5ed Smoking cessation- 3-5 min instruction/counseling given, counseled patient on the dangers of tobacco use, advised patient to stop smoking, and reviewed strategies to maximize success, patient not ready to quit at this time.   Psoriasis Follows with derm; improved with methotrexate  Left ankle pain ? Tendonitis r/t underlying arthritis, continue brace, continue RICE, schedule ortho due to persistent sx lasting 12+ weeks, may need  further imaging Will add meloxicam 15 mg 2-4 weeks then PRN  Future Appointments  Date Time Provider [place]  08/12/2020  2:00 PM [name], NP [facility] None    Plan:   During the course of the visit the patient was educated and counseled about appropriate screening and preventive services including:    Influenza vaccine  Td vaccine  Screening electrocardiogram  Screening mammography  Bone densitometry screening  Colorectal cancer screening  Diabetes screening  Glaucoma screening  Nutrition counseling    Subjective:   [name] [name] is a 74 y.o. female who presents for Medicare Annual Wellness Visit and 3 month follow up for HTN, chol, preDM, and depression.   She is on Ativan for sleep and zoloft for depression; successfully tapered off of ativan this year, reports sleeping well. Has occasional down day as her mom passed away around this time in 2019.   She smokes 1 pack of cigarettes/month - on the weekend with a bottle of wine.  Has emphysema by imaging, asymptomatic, no perceived benefit with inhalers Hx of 30+ pack year history, had CT lung 08/2018 with small nodule recommended for annual screening.   Psoriatic arthritis/cervical arthritis, followed by Dr. Amil Amen, now on low dose methotrexate 0.25 mg 8 tabs per week, psoriasis is resolved but has residual L ankle pain since June despite steroid taper, followed up Aug 19, felt possible tendonitis and referred to ortho, did steroid injection but didn't help, trying to get back in with her regular ortho Dr. Onnie Graham, hasn't been taking antiinflammatory, doing RICE.   BMI is Body mass index is 23.86 kg/m., she has not been working on diet and exercise; she could walk 20 min walk if the weather was less hot near her home and plans to start daily in another few weeks Wt Readings from Last 3 Encounters:  05/05/20 139 lb (63 kg)  01/02/20 137 lb 3.2 oz (62.2 kg)  11/27/19 140 lb 3.2 oz (63.6 kg)    She has aortic atherosclerosis per CT 08/2018 Follows with Dr. 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  She denies chest pain, shortness of breath, dizziness.   She is on cholesterol medication (atorvastatin 40 mg three times/ week) and denies myalgias. Her cholesterol is at goal. The cholesterol last visit was:   Lab Results  Component Value Date   CHOL 187 01/02/2020   HDL 72 01/02/2020   LDLCALC 96 01/02/2020   TRIG 98 01/02/2020   CHOLHDL 2.6 01/02/2020   She has been working on diet and exercise for hx of prediabetes recently well controlled, and denies paresthesia of the feet, polydipsia, polyuria and visual disturbances. Last A1C in the office was:  Lab Results  Component Value Date   HGBA1C 5.3 01/02/2020    Lab Results  Component Value Date   GFRNONAA 77 01/02/2020   Patient is on Vitamin D supplement. Lab Results  Component Value Date   VD25OH 81 01/02/2020     Medication Review Current Outpatient Medications on File Prior to Visit  Medication Sig Dispense Refill   atorvastatin (LIPITOR) 40 MG tablet Take 1 tablet 3 x /week for Cholesterol 38 tablet 3   B Complex Vitamins (B COMPLEX PO) Take 1 tablet by mouth every other day.      Cholecalciferol (VITAMIN D3) 5000 UNITS TABS Take 5,000 Units by mouth 2 (two) times daily.      folic acid (FOLVITE) 1 MG tablet Take 1 mg by mouth daily.     Magnesium 250 MG TABS Take 250 mg by mouth daily.      methotrexate (RHEUMATREX) 2.5 MG tablet Take 2.5 mg by mouth once a week. Per patient takes 8 tablets once a week------reported on 01/02/2020     metoprolol tartrate (LOPRESSOR) 25 MG tablet TAKE ONE TABLET BY MOUTH DAILY 90 tablet 1   mirabegron ER (MYRBETRIQ) 50 MG TB24 tablet Take 50 mg by mouth daily.     sertraline (ZOLOFT) 100  MG tablet Take 1 tablet Daily for Mood 90 tablet 0   LORazepam (ATIVAN) 1 MG tablet TAKE 1/2 TABLET TO 1 TABLET BY MOUTH AT BEDTIME FOR SLEEP ONLY IF NEEDED (Patient not taking: Reported on 05/05/2020) 30 tablet 0   No current facility-administered medications on file prior to visit.    Current Problems (verified) Patient Active Problem List   Diagnosis Date Noted   Current smoker on some days 04/25/2019   Depression, major, recurrent, in partial remission ([place]) 07/31/2018   Pulmonary emphysema ([place]) 06/13/2017   Atherosclerosis of aorta ([place]) 06/13/2017   Psoriatic arthritis ([place]) 05/25/2016   Hereditary and idiopathic peripheral neuropathy 05/25/2016   Encounter for Medicare annual wellness exam 04/27/2015   Insomnia 11/27/2014   Cervical arthritis ([place]) 10/10/2014   Medication management 10/02/2014   Mixed hyperlipidemia 07/01/2014   Hypertension    Other abnormal glucose    Vitamin D deficiency    Mitral valve disorder 06/16/2009   Diverticulosis of colon 05/06/2008    Screening Tests Immunization History  Administered Date(s) Administered   DT (Pediatric) 04/27/2015   Influenza, High Dose Seasonal PF 07/10/2019   PFIZER SARS-COV-2 Vaccination 10/11/2019, 11/03/2019   Pneumococcal Conjugate-13  04/27/2015   Pneumococcal Polysaccharide-23 12/22/2004, 11/24/2016   [place], IM 05/04/2016, 05/07/2016   Td 12/16/2003   Preventative care: Last colonoscopy: 12/2016, 5 year follow up EGD 02/2015 Last mammogram: 05/2018 - [facility], patient will schedule, phone number given Last pap smear/pelvic exam: 11/2012 , 1 abnormal in her 20's, declines another DEXA: at OB/GYN-08/2015 normal  Echo 2009 normal EF Ct AB 11/2014 Ct chest: 08/2018, small R nodule, follow up ordered  CXR: 2018 MRI abd: 08/2018 - benign renal cyst   Prior vaccinations: TD or Tdap: 2016 Influenza: 2020 Pneumococcal: 2018 Prevnar 13: 2016 Shingles/Zostavax: declines Covid 19: 2/2, 2021,  pfizer   Names of Other Physician/Practitioners you currently use: 1. [facility]- here for primary care 2. Dr. Sabra Heck, eye doctor, last visit 2021, mild cataracts monitoring  3. Dr. Rona Ravens, dentist, last visit 2021, q49m 4. Dr. Renda Rolls, derm, last visit 2020, goes annually, has scheduled tomorrow  Patient Care Team: Unk Pinto, MD as PCP - General (Internal Medicine) Josue Hector, MD as PCP - Cardiology (Cardiology) Justice Britain, MD as Consulting Physician (Orthopedic Surgery) Iran Planas, MD as Consulting Physician (Orthopedic Surgery) Thornell Sartorius, MD as Consulting Physician (Otolaryngology) Josue Hector, MD as Consulting Physician (Cardiology) Rana Snare, MD as Consulting Physician (Urology) Fontaine, Belinda Block, MD (Inactive) as Consulting Physician (Gynecology) Renda Rolls, Jennefer Bravo, MD as Referring Physician (Dermatology) Marica Otter, OD (Optometry) Merrilee Jansky , DDS   Allergies Allergies  Allergen Reactions   Bactrim [Sulfamethoxazole-Trimethoprim] Swelling    Face and eyes swollen and red.   Ketek [Telithromycin]     Unknown    SURGICAL HISTORY She  has a past surgical history that includes Tympanoplasty (Right); Rotator cuff repair (Left); Ectopic pregnancy surgery (1971, 1979); Hemorrhoid surgery; Oophorectomy; Gynecologic cryosurgery (1976); Abdominal hysterectomy (2000); Rotator cuff repair (Right, 2/15); Colonoscopy; Esophagogastroduodenoscopy; Upper gastrointestinal endoscopy; Ganglion cyst excision (Right); and Finger surgery. FAMILY HISTORY Her family history includes Cirrhosis in her father; Colon cancer in her maternal grandmother; Colon polyps in her sister; Diabetes in her father and mother; Heart disease in her father, maternal grandfather, paternal grandfather, and paternal grandmother; Hyperlipidemia in her father; Hypertension in her father and mother; Lymphoma in her maternal grandmother;  Melanoma in her mother; Stomach cancer in her maternal grandmother; Uterine cancer in her sister. SOCIAL HISTORY She  reports that she has been smoking cigarettes. She started smoking about 54 years ago. She has a 32.40 pack-year smoking history. She has never used smokeless tobacco. She reports current alcohol use. She reports that she does not use drugs.  MEDICARE WELLNESS OBJECTIVES: Physical activity: Current Exercise Habits: The patient does not participate in regular exercise at present, Exercise limited by: orthopedic condition(s) Cardiac risk factors: Cardiac Risk Factors include: advanced age (>35men, >50 women);dyslipidemia;hypertension;smoking/ tobacco exposure Depression/mood screen:   Depression screen [facility] 2/9 05/05/2020  Decreased Interest 0  Down, Depressed, Hopeless 1  PHQ - 2 Score 1  Altered sleeping 0  Tired, decreased energy 0  Change in appetite 0  Feeling bad or failure about yourself  0  Trouble concentrating 0  Moving slowly or fidgety/restless 0  Suicidal thoughts 0  PHQ-9 Score 1  Difficult doing work/chores Not difficult at all    ADLs:  In your present state of health, do you have any difficulty performing the following activities: 05/05/2020 01/01/2020  Hearing? N N  Vision? N N  Difficulty concentrating or making decisions? N N  Walking or climbing stairs? N N  Dressing or  bathing? N N  Doing errands, shopping? N N  Some recent data might be hidden    Cognitive Testing  Alert? Yes  Normal Appearance?Yes  Oriented to person? Yes  Place? Yes   Time? Yes  Recall of three objects?  Yes  Can perform simple calculations? Yes  Displays appropriate judgment?Yes  Can read the correct time from a watch face?Yes  EOL planning: Does Patient Have a Medical Advance Directive?: Yes Type of Advance Directive: Healthcare Power of Attorney, Living will Does patient want to make changes to medical advance directive?: No - Patient declined Copy of [place] in Chart?: No - copy requested   Objective:   Blood pressure 112/70, pulse (!) 57, temperature (!) 96.6 F (35.9 C), height 5\' 4"  (1.626 m), weight 139 lb (63 kg), SpO2 96 %. Body mass index is 23.86 kg/m.  General appearance: alert, no distress, WD/WN,  female HEENT: normocephalic, sclerae anicteric, TMs pearly, nares patent, no discharge or erythema, pharynx normal, right inner nostril at septum with small tender nodule, non fluctuant  Oral cavity: MMM, no lesions Neck: supple, no lymphadenopathy, no thyromegaly, no masses Heart: RRR, bradycardia, no murmurs, clicks, rubs, gallop, normal S1, S2 Lungs: CTA bilaterally, no wheezes, rhonchi, or rales Abdomen: +bs, soft, non tender, no hepatomegaly, no splenomegaly Musculoskeletal: left ankle with mild swelling, heat, tenderness with palpation below malleolus, full ROM without laxity or pain; non-antalgic gait Extremities:  no cyanosis, no clubbing Pulses: 2+ symmetric, upper and lower extremities, normal cap refill Derm: warm, dry; she has plaque to right foot dorsum, approx 5 cm area, another to R lateral ankle Neurological: alert, oriented x 3, CN2-12 intact, strength normal upper extremities and lower extremities, sensation normal throughout, DTRs 2+ throughout, no cerebellar signs, gait normal Psychiatric: normal affect, behavior normal, pleasant   Medicare Attestation I have personally reviewed: The patient's medical and social history Their use of alcohol, tobacco or illicit drugs Their current medications and supplements The patient's functional ability including ADLs,fall risks, home safety risks, cognitive, and hearing and visual impairment Diet and physical activities Evidence for depression or mood disorders  The patient's weight, height, BMI, and visual acuity have been recorded in the chart.  I have made referrals, counseling, and provided education to the patient based on review of the above and I have provided  the patient with a written personalized care plan for preventive services.     Izora Ribas, NP   05/05/2020

## 2020-05-05 ENCOUNTER — Ambulatory Visit (INDEPENDENT_AMBULATORY_CARE_PROVIDER_SITE_OTHER): Payer: PPO | Admitting: Adult Health

## 2020-05-05 ENCOUNTER — Other Ambulatory Visit: Payer: Self-pay

## 2020-05-05 ENCOUNTER — Encounter: Payer: Self-pay | Admitting: Adult Health

## 2020-05-05 VITALS — BP 112/70 | HR 57 | Temp 96.6°F | Ht 64.0 in | Wt 139.0 lb

## 2020-05-05 DIAGNOSIS — G609 Hereditary and idiopathic neuropathy, unspecified: Secondary | ICD-10-CM | POA: Diagnosis not present

## 2020-05-05 DIAGNOSIS — M25572 Pain in left ankle and joints of left foot: Secondary | ICD-10-CM

## 2020-05-05 DIAGNOSIS — E782 Mixed hyperlipidemia: Secondary | ICD-10-CM

## 2020-05-05 DIAGNOSIS — Z79899 Other long term (current) drug therapy: Secondary | ICD-10-CM | POA: Diagnosis not present

## 2020-05-05 DIAGNOSIS — L4050 Arthropathic psoriasis, unspecified: Secondary | ICD-10-CM | POA: Diagnosis not present

## 2020-05-05 DIAGNOSIS — I059 Rheumatic mitral valve disease, unspecified: Secondary | ICD-10-CM

## 2020-05-05 DIAGNOSIS — R6889 Other general symptoms and signs: Secondary | ICD-10-CM | POA: Diagnosis not present

## 2020-05-05 DIAGNOSIS — Z0001 Encounter for general adult medical examination with abnormal findings: Secondary | ICD-10-CM | POA: Diagnosis not present

## 2020-05-05 DIAGNOSIS — F3341 Major depressive disorder, recurrent, in partial remission: Secondary | ICD-10-CM

## 2020-05-05 DIAGNOSIS — J439 Emphysema, unspecified: Secondary | ICD-10-CM

## 2020-05-05 DIAGNOSIS — I10 Essential (primary) hypertension: Secondary | ICD-10-CM | POA: Diagnosis not present

## 2020-05-05 DIAGNOSIS — K5730 Diverticulosis of large intestine without perforation or abscess without bleeding: Secondary | ICD-10-CM | POA: Diagnosis not present

## 2020-05-05 DIAGNOSIS — R7309 Other abnormal glucose: Secondary | ICD-10-CM | POA: Diagnosis not present

## 2020-05-05 DIAGNOSIS — F17200 Nicotine dependence, unspecified, uncomplicated: Secondary | ICD-10-CM

## 2020-05-05 DIAGNOSIS — Z0000 Encounter for general adult medical examination without abnormal findings: Secondary | ICD-10-CM

## 2020-05-05 DIAGNOSIS — E559 Vitamin D deficiency, unspecified: Secondary | ICD-10-CM | POA: Diagnosis not present

## 2020-05-05 DIAGNOSIS — R911 Solitary pulmonary nodule: Secondary | ICD-10-CM

## 2020-05-05 DIAGNOSIS — I700 Atherosclerosis of aorta: Secondary | ICD-10-CM

## 2020-05-05 DIAGNOSIS — M47812 Spondylosis without myelopathy or radiculopathy, cervical region: Secondary | ICD-10-CM | POA: Diagnosis not present

## 2020-05-05 DIAGNOSIS — M25472 Effusion, left ankle: Secondary | ICD-10-CM

## 2020-05-05 MED ORDER — MELOXICAM 15 MG PO TABS: ORAL_TABLET | ORAL | 1 refills | Status: DC

## 2020-05-05 NOTE — Patient Instructions (Addendum)
[name] , Thank you for taking time to come for your Medicare Wellness Visit. I appreciate your ongoing commitment to your health goals. Please review the following plan we discussed and let me know if I can assist you in the future.   These are the goals we discussed: Goals     Exercise 150 min/wk Moderate Activity     Quit Smoking       This is a list of the screening recommended for you and due dates:  Health Maintenance  Topic Date Due   Flu Shot  03/29/2020   Mammogram  06/27/2020   Colon Cancer Screening  01/24/2022   Tetanus Vaccine  04/26/2025   DEXA scan (bone density measurement)  Completed   COVID-19 Vaccine  Completed    Hepatitis C: One time screening is recommended by Center for Disease Control  (CDC) for  adults born from [birth year] through 1964.   Completed   Pneumonia vaccines  Completed     HOW TO SCHEDULE A MAMMOGRAM  [facility]  7 a.m.-6:30 p.m., Monday 7 a.m.-5 p.m., Tuesday-Friday Schedule an appointment by calling [phone].  [facility] Schedule an appointment by calling [phone].     Continue rest, ice, compression, elevation of ankle Also start antiinflammatory - meloxicam 1 tab daily with food for 2-4 weeks, then 1/2-1 tab daily as needed pending ortho evaluation      Meloxicam tablets What is this medicine? MELOXICAM (mel OX i cam) is a non-steroidal anti-inflammatory drug (NSAID). It is used to reduce swelling and to treat pain. It may be used for osteoarthritis, rheumatoid arthritis, or juvenile rheumatoid arthritis. This medicine may be used for other purposes; ask your health care provider or pharmacist if you have questions. COMMON BRAND NAME(S): Mobic What should I tell my health care provider before I take this medicine? They need to know if you have any of these conditions:  bleeding disorders  cigarette smoker  coronary artery bypass graft (CABG) surgery within the past 2  weeks  drink more than 3 alcohol-containing drinks per day  heart disease  high blood pressure  history of stomach bleeding  kidney disease  liver disease  lung or breathing disease, like asthma  stomach or intestine problems  an unusual or allergic reaction to meloxicam, aspirin, other NSAIDs, other medicines, foods, dyes, or preservatives  pregnant or trying to get pregnant  breast-feeding How should I use this medicine? Take this medicine by mouth with a full glass of water. Follow the directions on the prescription label. You can take it with or without food. If it upsets your stomach, take it with food. Take your medicine at regular intervals. Do not take it more often than directed. Do not stop taking except on your doctor's advice. A special MedGuide will be given to you by the pharmacist with each prescription and refill. Be sure to read this information carefully each time. Talk to your pediatrician regarding the use of this medicine in children. While this drug may be prescribed for selected conditions, precautions do apply. Patients over 12 years old may have a stronger reaction and need a smaller dose. Overdosage: If you think you have taken too much of this medicine contact a poison control center or emergency room at once. NOTE: This medicine is only for you. Do not share this medicine with others. What if I miss a dose? If you miss a dose, take it as soon as you can. If it is  almost time for your next dose, take only that dose. Do not take double or extra doses. What may interact with this medicine? Do not take this medicine with any of the following medications:  cidofovir  ketorolac This medicine may also interact with the following medications:  aspirin and aspirin-like medicines  certain medicines for blood pressure, heart disease, irregular heart beat  certain medicines for depression, anxiety, or psychotic disturbances  certain medicines that treat  or prevent blood clots like warfarin, enoxaparin, dalteparin, apixaban, dabigatran, rivaroxaban  cyclosporine  diuretics  fluconazole  lithium  methotrexate  other NSAIDs, medicines for pain and inflammation, like ibuprofen and naproxen  pemetrexed This list may not describe all possible interactions. Give your health care provider a list of all the medicines, herbs, non-prescription drugs, or dietary supplements you use. Also tell them if you smoke, drink alcohol, or use illegal drugs. Some items may interact with your medicine. What should I watch for while using this medicine? Tell your doctor or healthcare provider if your symptoms do not start to get better or if they get worse. This medicine may cause serious skin reactions. They can happen weeks to months after starting the medicine. Contact your healthcare provider right away if you notice fevers or flu-like symptoms with a rash. The rash may be red or purple and then turn into blisters or peeling of the skin. Or, you might notice a red rash with swelling of the face, lips or lymph nodes in your neck or under your arms. Do not take other medicines that contain aspirin, ibuprofen, or naproxen with this medicine. Side effects such as stomach upset, nausea, or ulcers may be more likely to occur. Many medicines available without a prescription should not be taken with this medicine. This medicine can cause ulcers and bleeding in the stomach and intestines at any time during treatment. This can happen with no warning and may cause death. There is increased risk with taking this medicine for a long time. Smoking, drinking alcohol, older age, and poor health can also increase risks. Call your doctor right away if you have stomach pain or blood in your vomit or stool. This medicine does not prevent heart attack or stroke. In fact, this medicine may increase the chance of a heart attack or stroke. The chance may increase with longer use of this  medicine and in people who have heart disease. If you take aspirin to prevent heart attack or stroke, talk with your doctor or healthcare provider. What side effects may I notice from receiving this medicine? Side effects that you should report to your doctor or health care professional as soon as possible:  allergic reactions like skin rash, itching or hives, swelling of the face, lips, or tongue  nausea, vomiting  redness, blistering, peeling, or loosening of the skin, including inside the mouth  signs and symptoms of a blood clot such as breathing problems; changes in vision; chest pain; severe, sudden headache; pain, swelling, warmth in the leg; trouble speaking; sudden numbness or weakness of the face, arm, or leg  signs and symptoms of bleeding such as bloody or black, tarry stools; red or dark-brown urine; spitting up blood or brown material that looks like coffee grounds; red spots on the skin; unusual bruising or bleeding from the eye, gums, or nose  signs and symptoms of liver injury like dark yellow or brown urine; general ill feeling or flu-like symptoms; light-colored stools; loss of appetite; nausea; right upper belly pain; unusually weak  or tired; yellowing of the eyes or skin  signs and symptoms of stroke like changes in vision; confusion; trouble speaking or understanding; severe headaches; sudden numbness or weakness of the face, arm, or leg; trouble walking; dizziness; loss of balance or coordination Side effects that usually do not require medical attention (report to your doctor or health care professional if they continue or are bothersome):  constipation  diarrhea  gas This list may not describe all possible side effects. Call your doctor for medical advice about side effects. You may report side effects to FDA at 1-800-FDA-1088. Where should I keep my medicine? Keep out of the reach of children. Store at room temperature between 15 and 30 degrees C (59 and 86  degrees F). Throw away any unused medicine after the expiration date. NOTE: This sheet is a summary. It may not cover all possible information. If you have questions about this medicine, talk to your doctor, pharmacist, or health care provider.  2020 Elsevier/Gold Standard (2018-11-14 11:21:28)

## 2020-05-06 ENCOUNTER — Encounter: Payer: Self-pay | Admitting: Adult Health

## 2020-05-06 DIAGNOSIS — M25572 Pain in left ankle and joints of left foot: Secondary | ICD-10-CM | POA: Diagnosis not present

## 2020-05-06 DIAGNOSIS — Z411 Encounter for cosmetic surgery: Secondary | ICD-10-CM | POA: Diagnosis not present

## 2020-05-06 DIAGNOSIS — L814 Other melanin hyperpigmentation: Secondary | ICD-10-CM | POA: Diagnosis not present

## 2020-05-06 DIAGNOSIS — L821 Other seborrheic keratosis: Secondary | ICD-10-CM | POA: Diagnosis not present

## 2020-05-06 DIAGNOSIS — Z85828 Personal history of other malignant neoplasm of skin: Secondary | ICD-10-CM | POA: Diagnosis not present

## 2020-05-06 DIAGNOSIS — D649 Anemia, unspecified: Secondary | ICD-10-CM | POA: Insufficient documentation

## 2020-05-06 DIAGNOSIS — L0100 Impetigo, unspecified: Secondary | ICD-10-CM | POA: Diagnosis not present

## 2020-05-06 DIAGNOSIS — D225 Melanocytic nevi of trunk: Secondary | ICD-10-CM | POA: Diagnosis not present

## 2020-05-06 DIAGNOSIS — L400 Psoriasis vulgaris: Secondary | ICD-10-CM | POA: Diagnosis not present

## 2020-05-06 LAB — LIPID PANEL
Cholesterol: 187 mg/dL (ref <200)
HDL: 83 mg/dL (ref >=50)
LDL Cholesterol (Calc): 83 mg/dL (calc)
Non-HDL Cholesterol (Calc): 104 mg/dL (calc) (ref <130)
Total CHOL/HDL Ratio: 2.3 (calc) (ref <5.0)
Triglycerides: 114 mg/dL (ref <150)

## 2020-05-06 LAB — CBC WITH DIFFERENTIAL/PLATELET
Absolute Monocytes: 203 cells/uL (ref 200–950)
Basophils Absolute: 28 cells/uL (ref 0–200)
Basophils Relative: 0.4 %
Eosinophils Absolute: 77 cells/uL (ref 15–500)
Eosinophils Relative: 1.1 %
HCT: 33.1 % — ABNORMAL LOW (ref 35.0–45.0)
Hemoglobin: 11.4 g/dL — ABNORMAL LOW (ref 11.7–15.5)
Lymphs Abs: 2177 cells/uL (ref 850–3900)
MCH: 34.1 pg — ABNORMAL HIGH (ref 27.0–33.0)
MCHC: 34.4 g/dL (ref 32.0–36.0)
MCV: 99.1 fL (ref 80.0–100.0)
MPV: 9.2 fL (ref 7.5–12.5)
Monocytes Relative: 2.9 %
Neutro Abs: 4515 cells/uL (ref 1500–7800)
Neutrophils Relative %: 64.5 %
Platelets: 275 10*3/uL (ref 140–400)
RBC: 3.34 10*6/uL — ABNORMAL LOW (ref 3.80–5.10)
RDW: 12.7 % (ref 11.0–15.0)
Total Lymphocyte: 31.1 %
WBC: 7 10*3/uL (ref 3.8–10.8)

## 2020-05-06 LAB — COMPLETE METABOLIC PANEL WITH GFR
AG Ratio: 1.8 (calc) (ref 1.0–2.5)
ALT: 12 U/L (ref 6–29)
AST: 13 U/L (ref 10–35)
Albumin: 4.2 g/dL (ref 3.6–5.1)
Alkaline phosphatase (APISO): 72 U/L (ref 37–153)
BUN: 15 mg/dL (ref 7–25)
CO2: 26 mmol/L (ref 20–32)
Calcium: 9.4 mg/dL (ref 8.6–10.4)
Chloride: 108 mmol/L (ref 98–110)
Creat: 0.74 mg/dL (ref 0.60–0.93)
GFR, Est African American: 94 mL/min/{1.73_m2} (ref >=60)
GFR, Est Non African American: 81 mL/min/{1.73_m2} (ref >=60)
Globulin: 2.3 g/dL (calc) (ref 1.9–3.7)
Glucose, Bld: 96 mg/dL (ref 65–99)
Potassium: 4.5 mmol/L (ref 3.5–5.3)
Sodium: 142 mmol/L (ref 135–146)
Total Bilirubin: 0.6 mg/dL (ref 0.2–1.2)
Total Protein: 6.5 g/dL (ref 6.1–8.1)

## 2020-05-06 LAB — TSH: TSH: 1.97 mIU/L (ref 0.40–4.50)

## 2020-05-06 LAB — MAGNESIUM: Magnesium: 2.1 mg/dL (ref 1.5–2.5)

## 2020-05-07 DIAGNOSIS — Z6823 Body mass index (BMI) 23.0-23.9, adult: Secondary | ICD-10-CM | POA: Diagnosis not present

## 2020-05-07 DIAGNOSIS — M25572 Pain in left ankle and joints of left foot: Secondary | ICD-10-CM | POA: Diagnosis not present

## 2020-05-07 DIAGNOSIS — Z1231 Encounter for screening mammogram for malignant neoplasm of breast: Secondary | ICD-10-CM | POA: Diagnosis not present

## 2020-05-07 DIAGNOSIS — L4059 Other psoriatic arthropathy: Secondary | ICD-10-CM | POA: Diagnosis not present

## 2020-05-07 LAB — HM MAMMOGRAPHY

## 2020-05-11 ENCOUNTER — Other Ambulatory Visit: Payer: Self-pay | Admitting: Internal Medicine

## 2020-05-11 DIAGNOSIS — M25472 Effusion, left ankle: Secondary | ICD-10-CM

## 2020-05-14 ENCOUNTER — Other Ambulatory Visit: Payer: Self-pay | Admitting: Adult Health

## 2020-05-14 DIAGNOSIS — Z122 Encounter for screening for malignant neoplasm of respiratory organs: Secondary | ICD-10-CM

## 2020-05-14 DIAGNOSIS — F17200 Nicotine dependence, unspecified, uncomplicated: Secondary | ICD-10-CM

## 2020-05-14 DIAGNOSIS — F17210 Nicotine dependence, cigarettes, uncomplicated: Secondary | ICD-10-CM

## 2020-05-18 ENCOUNTER — Ambulatory Visit
Admission: RE | Admit: 2020-05-18 | Discharge: 2020-05-18 | Disposition: A | Discharge status: Home / Self Care | Payer: PPO | Source: Ambulatory Visit | Attending: Adult Health | Admitting: Adult Health

## 2020-05-18 DIAGNOSIS — J432 Centrilobular emphysema: Secondary | ICD-10-CM | POA: Diagnosis not present

## 2020-05-18 DIAGNOSIS — F17210 Nicotine dependence, cigarettes, uncomplicated: Secondary | ICD-10-CM

## 2020-05-18 DIAGNOSIS — I2510 Atherosclerotic heart disease of native coronary artery without angina pectoris: Secondary | ICD-10-CM | POA: Diagnosis not present

## 2020-05-18 DIAGNOSIS — F17200 Nicotine dependence, unspecified, uncomplicated: Secondary | ICD-10-CM

## 2020-05-18 DIAGNOSIS — I700 Atherosclerosis of aorta: Secondary | ICD-10-CM | POA: Diagnosis not present

## 2020-05-18 DIAGNOSIS — Z122 Encounter for screening for malignant neoplasm of respiratory organs: Secondary | ICD-10-CM

## 2020-05-20 ENCOUNTER — Other Ambulatory Visit: Payer: Self-pay | Admitting: Adult Health

## 2020-05-27 ENCOUNTER — Ambulatory Visit
Admission: RE | Admit: 2020-05-27 | Discharge: 2020-05-27 | Disposition: A | Discharge status: Home / Self Care | Payer: PPO | Source: Ambulatory Visit | Attending: Internal Medicine | Admitting: Internal Medicine

## 2020-05-27 ENCOUNTER — Other Ambulatory Visit: Payer: Self-pay

## 2020-05-27 DIAGNOSIS — R600 Localized edema: Secondary | ICD-10-CM | POA: Diagnosis not present

## 2020-05-27 DIAGNOSIS — M19072 Primary osteoarthritis, left ankle and foot: Secondary | ICD-10-CM | POA: Diagnosis not present

## 2020-05-27 DIAGNOSIS — M25572 Pain in left ankle and joints of left foot: Secondary | ICD-10-CM

## 2020-05-27 DIAGNOSIS — M65872 Other synovitis and tenosynovitis, left ankle and foot: Secondary | ICD-10-CM | POA: Diagnosis not present

## 2020-05-27 DIAGNOSIS — S96912A Strain of unspecified muscle and tendon at ankle and foot level, left foot, initial encounter: Secondary | ICD-10-CM | POA: Diagnosis not present

## 2020-05-29 ENCOUNTER — Other Ambulatory Visit: Payer: PPO

## 2020-06-02 ENCOUNTER — Encounter: Payer: Self-pay | Admitting: Internal Medicine

## 2020-06-03 DIAGNOSIS — M19072 Primary osteoarthritis, left ankle and foot: Secondary | ICD-10-CM | POA: Diagnosis not present

## 2020-06-03 DIAGNOSIS — M6702 Short Achilles tendon (acquired), left ankle: Secondary | ICD-10-CM | POA: Diagnosis not present

## 2020-06-03 DIAGNOSIS — M25572 Pain in left ankle and joints of left foot: Secondary | ICD-10-CM | POA: Diagnosis not present

## 2020-06-03 DIAGNOSIS — M76822 Posterior tibial tendinitis, left leg: Secondary | ICD-10-CM | POA: Diagnosis not present

## 2020-06-03 DIAGNOSIS — M79672 Pain in left foot: Secondary | ICD-10-CM | POA: Diagnosis not present

## 2020-06-04 ENCOUNTER — Other Ambulatory Visit (HOSPITAL_COMMUNITY): Payer: Self-pay | Admitting: Orthopedic Surgery

## 2020-06-12 DIAGNOSIS — M25572 Pain in left ankle and joints of left foot: Secondary | ICD-10-CM | POA: Diagnosis not present

## 2020-06-17 ENCOUNTER — Other Ambulatory Visit: Payer: Self-pay

## 2020-06-17 ENCOUNTER — Encounter (HOSPITAL_BASED_OUTPATIENT_CLINIC_OR_DEPARTMENT_OTHER): Payer: Self-pay | Admitting: Orthopedic Surgery

## 2020-06-19 ENCOUNTER — Other Ambulatory Visit: Payer: Self-pay | Admitting: Internal Medicine

## 2020-06-22 ENCOUNTER — Other Ambulatory Visit (HOSPITAL_COMMUNITY)
Admission: RE | Admit: 2020-06-22 | Discharge: 2020-06-22 | Disposition: A | Discharge status: Home / Self Care | Payer: PPO | Source: Ambulatory Visit | Attending: Orthopedic Surgery | Admitting: Orthopedic Surgery

## 2020-06-22 DIAGNOSIS — Z20822 Contact with and (suspected) exposure to covid-19: Secondary | ICD-10-CM | POA: Diagnosis not present

## 2020-06-22 DIAGNOSIS — Z01812 Encounter for preprocedural laboratory examination: Secondary | ICD-10-CM | POA: Insufficient documentation

## 2020-06-22 LAB — SARS CORONAVIRUS 2 (TAT 6-24 HRS): SARS Coronavirus 2: NEGATIVE

## 2020-06-25 ENCOUNTER — Ambulatory Visit (HOSPITAL_BASED_OUTPATIENT_CLINIC_OR_DEPARTMENT_OTHER)
Admission: RE | Admit: 2020-06-25 | Discharge: 2020-06-25 | Disposition: A | Discharge status: Home / Self Care | Payer: PPO | Attending: Orthopedic Surgery | Admitting: Orthopedic Surgery

## 2020-06-25 ENCOUNTER — Ambulatory Visit (HOSPITAL_BASED_OUTPATIENT_CLINIC_OR_DEPARTMENT_OTHER): Payer: PPO | Admitting: Certified Registered"

## 2020-06-25 ENCOUNTER — Encounter (HOSPITAL_BASED_OUTPATIENT_CLINIC_OR_DEPARTMENT_OTHER)
Admission: RE | Disposition: A | Discharge status: Home / Self Care | Payer: Self-pay | Source: Home / Self Care | Attending: Orthopedic Surgery

## 2020-06-25 ENCOUNTER — Encounter (HOSPITAL_BASED_OUTPATIENT_CLINIC_OR_DEPARTMENT_OTHER): Payer: Self-pay | Admitting: Orthopedic Surgery

## 2020-06-25 ENCOUNTER — Other Ambulatory Visit: Payer: Self-pay

## 2020-06-25 DIAGNOSIS — X58XXXA Exposure to other specified factors, initial encounter: Secondary | ICD-10-CM | POA: Insufficient documentation

## 2020-06-25 DIAGNOSIS — Z882 Allergy status to sulfonamides status: Secondary | ICD-10-CM | POA: Insufficient documentation

## 2020-06-25 DIAGNOSIS — Z881 Allergy status to other antibiotic agents status: Secondary | ICD-10-CM | POA: Diagnosis not present

## 2020-06-25 DIAGNOSIS — S93692A Other sprain of left foot, initial encounter: Secondary | ICD-10-CM | POA: Diagnosis not present

## 2020-06-25 DIAGNOSIS — F17210 Nicotine dependence, cigarettes, uncomplicated: Secondary | ICD-10-CM | POA: Insufficient documentation

## 2020-06-25 DIAGNOSIS — M19072 Primary osteoarthritis, left ankle and foot: Secondary | ICD-10-CM | POA: Insufficient documentation

## 2020-06-25 DIAGNOSIS — M6702 Short Achilles tendon (acquired), left ankle: Secondary | ICD-10-CM | POA: Insufficient documentation

## 2020-06-25 DIAGNOSIS — M76822 Posterior tibial tendinitis, left leg: Secondary | ICD-10-CM | POA: Insufficient documentation

## 2020-06-25 DIAGNOSIS — G8918 Other acute postprocedural pain: Secondary | ICD-10-CM | POA: Diagnosis not present

## 2020-06-25 DIAGNOSIS — M21072 Valgus deformity, not elsewhere classified, left ankle: Secondary | ICD-10-CM | POA: Diagnosis not present

## 2020-06-25 DIAGNOSIS — E782 Mixed hyperlipidemia: Secondary | ICD-10-CM | POA: Diagnosis not present

## 2020-06-25 DIAGNOSIS — M62462 Contracture of muscle, left lower leg: Secondary | ICD-10-CM | POA: Diagnosis not present

## 2020-06-25 HISTORY — PX: FOOT ARTHRODESIS: SHX1655

## 2020-06-25 HISTORY — DX: Depression, unspecified: F32.A

## 2020-06-25 HISTORY — DX: Other specified postprocedural states: Z98.890

## 2020-06-25 HISTORY — PX: TENOLYSIS: SHX396

## 2020-06-25 HISTORY — PX: CALCANEAL OSTEOTOMY: SHX1281

## 2020-06-25 HISTORY — DX: Nausea with vomiting, unspecified: R11.2

## 2020-06-25 HISTORY — PX: GASTROCNEMIUS RECESSION: SHX863

## 2020-06-25 HISTORY — DX: Other complications of anesthesia, initial encounter: T88.59XA

## 2020-06-25 HISTORY — DX: Short Achilles tendon (acquired), left ankle: M67.02

## 2020-06-25 HISTORY — DX: Chronic obstructive pulmonary disease, unspecified: J44.9

## 2020-06-25 SURGERY — RECESSION, MUSCLE, GASTROCNEMIUS
Anesthesia: General | Site: Leg Lower | Laterality: Left

## 2020-06-25 MED ORDER — FENTANYL CITRATE (PF) 100 MCG/2ML IJ SOLN
INTRAMUSCULAR | Status: AC
  Filled 2020-06-25: qty 2

## 2020-06-25 MED ORDER — 0.9 % SODIUM CHLORIDE (POUR BTL) OPTIME
TOPICAL | Status: DC | PRN
  Administered 2020-06-25: 200 mL

## 2020-06-25 MED ORDER — FENTANYL CITRATE (PF) 100 MCG/2ML IJ SOLN
100.0000 ug | Freq: Once | INTRAMUSCULAR | Status: AC
  Administered 2020-06-25: 50 ug via INTRAVENOUS

## 2020-06-25 MED ORDER — OXYCODONE HCL 5 MG PO TABS
5.0000 mg | ORAL_TABLET | Freq: Once | ORAL | Status: AC | PRN
  Administered 2020-06-25: 5 mg via ORAL

## 2020-06-25 MED ORDER — DEXAMETHASONE SODIUM PHOSPHATE 10 MG/ML IJ SOLN
INTRAMUSCULAR | Status: AC
  Filled 2020-06-25: qty 2

## 2020-06-25 MED ORDER — BUPIVACAINE-EPINEPHRINE (PF) 0.5% -1:200000 IJ SOLN
INTRAMUSCULAR | Status: AC
  Filled 2020-06-25: qty 30

## 2020-06-25 MED ORDER — OXYCODONE HCL 5 MG PO TABS
5.0000 mg | ORAL_TABLET | ORAL | 0 refills | Status: AC | PRN
Start: 2020-06-25 — End: 2020-06-30

## 2020-06-25 MED ORDER — CLONIDINE HCL (ANALGESIA) 100 MCG/ML EP SOLN
EPIDURAL | Status: DC | PRN
  Administered 2020-06-25: 100 ug

## 2020-06-25 MED ORDER — OXYCODONE HCL 5 MG/5ML PO SOLN: 5.0000 mg | Freq: Once | ORAL | Status: AC | PRN

## 2020-06-25 MED ORDER — FENTANYL CITRATE (PF) 100 MCG/2ML IJ SOLN
25.0000 ug | INTRAMUSCULAR | Status: DC | PRN
  Administered 2020-06-25 (×2): 25 ug via INTRAVENOUS

## 2020-06-25 MED ORDER — SENNA 8.6 MG PO TABS: 2.0000 | ORAL_TABLET | Freq: Two times a day (BID) | ORAL | 0 refills | Status: DC

## 2020-06-25 MED ORDER — ROPIVACAINE HCL 5 MG/ML IJ SOLN
INTRAMUSCULAR | Status: DC | PRN
  Administered 2020-06-25: 30 mL via PERINEURAL

## 2020-06-25 MED ORDER — SODIUM CHLORIDE 0.9 % IV SOLN: INTRAVENOUS | Status: DC

## 2020-06-25 MED ORDER — CEFAZOLIN SODIUM-DEXTROSE 2-4 GM/100ML-% IV SOLN
2.0000 g | INTRAVENOUS | Status: AC
  Administered 2020-06-25: 2 g via INTRAVENOUS

## 2020-06-25 MED ORDER — VANCOMYCIN HCL 500 MG IV SOLR
INTRAVENOUS | Status: DC | PRN
  Administered 2020-06-25: 500 mg via TOPICAL

## 2020-06-25 MED ORDER — VANCOMYCIN HCL 500 MG IV SOLR
INTRAVENOUS | Status: AC
  Filled 2020-06-25: qty 500

## 2020-06-25 MED ORDER — ONDANSETRON HCL 4 MG/2ML IJ SOLN
INTRAMUSCULAR | Status: DC | PRN
  Administered 2020-06-25: 4 mg via INTRAVENOUS

## 2020-06-25 MED ORDER — BUPIVACAINE HCL (PF) 0.5 % IJ SOLN
INTRAMUSCULAR | Status: AC
  Filled 2020-06-25: qty 30

## 2020-06-25 MED ORDER — PROPOFOL 500 MG/50ML IV EMUL
INTRAVENOUS | Status: AC
  Filled 2020-06-25: qty 100

## 2020-06-25 MED ORDER — OXYCODONE HCL 5 MG PO TABS
ORAL_TABLET | ORAL | Status: AC
  Filled 2020-06-25: qty 1

## 2020-06-25 MED ORDER — CEFAZOLIN SODIUM-DEXTROSE 2-4 GM/100ML-% IV SOLN
INTRAVENOUS | Status: AC
  Filled 2020-06-25: qty 100

## 2020-06-25 MED ORDER — ONDANSETRON HCL 4 MG/2ML IJ SOLN
INTRAMUSCULAR | Status: AC
  Filled 2020-06-25: qty 10

## 2020-06-25 MED ORDER — ONDANSETRON HCL 4 MG/2ML IJ SOLN: 4.0000 mg | Freq: Once | INTRAMUSCULAR | Status: DC | PRN

## 2020-06-25 MED ORDER — MIDAZOLAM HCL 2 MG/2ML IJ SOLN
2.0000 mg | Freq: Once | INTRAMUSCULAR | Status: AC
  Administered 2020-06-25: 1 mg via INTRAVENOUS

## 2020-06-25 MED ORDER — EPHEDRINE 5 MG/ML INJ
INTRAVENOUS | Status: AC
  Filled 2020-06-25: qty 30

## 2020-06-25 MED ORDER — DOCUSATE SODIUM 100 MG PO CAPS: 100.0000 mg | ORAL_CAPSULE | Freq: Two times a day (BID) | ORAL | 0 refills | Status: DC

## 2020-06-25 MED ORDER — PROPOFOL 500 MG/50ML IV EMUL
INTRAVENOUS | Status: DC | PRN
  Administered 2020-06-25: 25 ug/kg/min via INTRAVENOUS

## 2020-06-25 MED ORDER — ACETAMINOPHEN 10 MG/ML IV SOLN: 1000.0000 mg | Freq: Once | INTRAVENOUS | Status: DC | PRN

## 2020-06-25 MED ORDER — LIDOCAINE 2% (20 MG/ML) 5 ML SYRINGE
INTRAMUSCULAR | Status: AC
  Filled 2020-06-25: qty 10

## 2020-06-25 MED ORDER — LIDOCAINE 2% (20 MG/ML) 5 ML SYRINGE
INTRAMUSCULAR | Status: DC | PRN
  Administered 2020-06-25: 30 mg via INTRAVENOUS

## 2020-06-25 MED ORDER — ASPIRIN EC 81 MG PO TBEC: 81.0000 mg | DELAYED_RELEASE_TABLET | Freq: Two times a day (BID) | ORAL | 0 refills | Status: DC

## 2020-06-25 MED ORDER — FENTANYL CITRATE (PF) 100 MCG/2ML IJ SOLN
INTRAMUSCULAR | Status: DC | PRN
  Administered 2020-06-25 (×2): 25 ug via INTRAVENOUS

## 2020-06-25 MED ORDER — DEXAMETHASONE SODIUM PHOSPHATE 10 MG/ML IJ SOLN
INTRAMUSCULAR | Status: DC | PRN
  Administered 2020-06-25: 5 mg via INTRAVENOUS

## 2020-06-25 MED ORDER — LACTATED RINGERS IV SOLN: INTRAVENOUS | Status: DC

## 2020-06-25 MED ORDER — MIDAZOLAM HCL 2 MG/2ML IJ SOLN
INTRAMUSCULAR | Status: AC
  Filled 2020-06-25: qty 2

## 2020-06-25 MED ORDER — PROPOFOL 10 MG/ML IV BOLUS
INTRAVENOUS | Status: DC | PRN
  Administered 2020-06-25: 150 mg via INTRAVENOUS

## 2020-06-25 SURGICAL SUPPLY — 104 items
BANDAGE ESMARK 6X9 LF (GAUZE/BANDAGES/DRESSINGS) ×3 IMPLANT
BIT DRILL 2.4 AO COUPLING CANN (BIT) ×5 IMPLANT
BIT DRILL CANN 3.5MM (DRILL) ×3 IMPLANT
BIT TREPHINE CORING 8 (BIT) IMPLANT
BIT TREPHINE CORING 8MM (BIT)
BLADE AVERAGE 25MMX9MM (BLADE)
BLADE AVERAGE 25X9 (BLADE) IMPLANT
BLADE MICRO SAGITTAL (BLADE) ×5 IMPLANT
BLADE SURG 15 STRL LF DISP TIS (BLADE) ×9 IMPLANT
BLADE SURG 15 STRL SS (BLADE) ×15
BNDG COHESIVE 4X5 TAN STRL (GAUZE/BANDAGES/DRESSINGS) ×5 IMPLANT
BNDG COHESIVE 6X5 TAN STRL LF (GAUZE/BANDAGES/DRESSINGS) ×5 IMPLANT
BNDG ELASTIC 4X5.8 VLCR STR LF (GAUZE/BANDAGES/DRESSINGS) IMPLANT
BNDG ESMARK 6X9 LF (GAUZE/BANDAGES/DRESSINGS) ×5
BOOT STEPPER DURA LG (SOFTGOODS) IMPLANT
BOOT STEPPER DURA MED (SOFTGOODS) IMPLANT
BOOT STEPPER DURA XLG (SOFTGOODS) IMPLANT
CANISTER SUCT 1200ML W/VALVE (MISCELLANEOUS) ×5 IMPLANT
CHLORAPREP W/TINT 26 (MISCELLANEOUS) ×5 IMPLANT
COVER BACK TABLE 60X90IN (DRAPES) ×5 IMPLANT
COVER WAND RF STERILE (DRAPES) IMPLANT
CUFF TOURN SGL QUICK 34 (TOURNIQUET CUFF) ×5
CUFF TRNQT CYL 34X4.125X (TOURNIQUET CUFF) ×3 IMPLANT
DECANTER SPIKE VIAL GLASS SM (MISCELLANEOUS) IMPLANT
DRAPE EXTREMITY T 121X128X90 (DISPOSABLE) ×5 IMPLANT
DRAPE OEC MINIVIEW 54X84 (DRAPES) ×5 IMPLANT
DRAPE U-SHAPE 47X51 STRL (DRAPES) ×5 IMPLANT
DRILL CANN 3.5MM (DRILL) ×5
DRSG MEPITEL 4X7.2 (GAUZE/BANDAGES/DRESSINGS) ×5 IMPLANT
DRSG PAD ABDOMINAL 8X10 ST (GAUZE/BANDAGES/DRESSINGS) ×10 IMPLANT
ELECT REM PT RETURN 9FT ADLT (ELECTROSURGICAL) ×5
ELECTRODE REM PT RTRN 9FT ADLT (ELECTROSURGICAL) ×3 IMPLANT
GAUZE SPONGE 4X4 12PLY STRL (GAUZE/BANDAGES/DRESSINGS) ×5 IMPLANT
GLOVE BIO SURGEON STRL SZ8 (GLOVE) ×5 IMPLANT
GLOVE BIOGEL PI IND STRL 8 (GLOVE) ×6 IMPLANT
GLOVE BIOGEL PI INDICATOR 8 (GLOVE) ×4
GLOVE ECLIPSE 8.0 STRL XLNG CF (GLOVE) ×5 IMPLANT
GLOVE SURG SS PI 7.0 STRL IVOR (GLOVE) ×5 IMPLANT
GOWN STRL REUS W/ TWL LRG LVL3 (GOWN DISPOSABLE) ×3 IMPLANT
GOWN STRL REUS W/ TWL XL LVL3 (GOWN DISPOSABLE) ×6 IMPLANT
GOWN STRL REUS W/TWL LRG LVL3 (GOWN DISPOSABLE) ×5
GOWN STRL REUS W/TWL XL LVL3 (GOWN DISPOSABLE) ×10
K-WIRE 1.8 (WIRE) ×5
K-WIRE FX200X1.8XTROC TIP (WIRE) ×3
K-WIRE TROC 1.25X150 (WIRE) ×5
KIT STAPLE ARCUS 20X17 STRL (Staple) ×5 IMPLANT
KWIRE FX200X1.8XTROC TIP (WIRE) ×3 IMPLANT
KWIRE TROC 1.25X150 (WIRE) ×3 IMPLANT
LOOP VESSEL MAXI BLUE (MISCELLANEOUS) IMPLANT
LOOP VESSEL MINI RED (MISCELLANEOUS) IMPLANT
NDL SAFETY ECLIPSE 18X1.5 (NEEDLE) IMPLANT
NDL SUT 6 .5 CRC .975X.05 MAYO (NEEDLE) ×3 IMPLANT
NEEDLE HYPO 18GX1.5 SHARP (NEEDLE)
NEEDLE HYPO 22GX1.5 SAFETY (NEEDLE) ×5 IMPLANT
NEEDLE HYPO 25X1 1.5 SAFETY (NEEDLE) IMPLANT
NEEDLE MAYO TAPER (NEEDLE) ×5
NS IRRIG 1000ML POUR BTL (IV SOLUTION) ×5 IMPLANT
PACK BASIN DAY SURGERY FS (CUSTOM PROCEDURE TRAY) ×5 IMPLANT
PAD CAST 4YDX4 CTTN HI CHSV (CAST SUPPLIES) ×3 IMPLANT
PADDING CAST ABS 4INX4YD NS (CAST SUPPLIES)
PADDING CAST ABS COTTON 4X4 ST (CAST SUPPLIES) IMPLANT
PADDING CAST COTTON 4X4 STRL (CAST SUPPLIES) ×5
PADDING CAST COTTON 6X4 STRL (CAST SUPPLIES) ×5 IMPLANT
PASSER SUT SWANSON 36MM LOOP (INSTRUMENTS) IMPLANT
PENCIL SMOKE EVACUATOR (MISCELLANEOUS) ×5 IMPLANT
RETRIEVER SUT HEWSON (MISCELLANEOUS) IMPLANT
SANITIZER HAND PURELL 535ML FO (MISCELLANEOUS) ×5 IMPLANT
SCREW CANN 4.0X50 (Screw) ×10 IMPLANT
SCREW CANN 5.0X30MM (Screw) ×5 IMPLANT
SCREW CANN PT 50X4 NS SM (Screw) ×6 IMPLANT
SHEET MEDIUM DRAPE 40X70 STRL (DRAPES) ×5 IMPLANT
SLEEVE SCD COMPRESS KNEE MED (MISCELLANEOUS) ×5 IMPLANT
SPLINT FAST PLASTER 5X30 (CAST SUPPLIES) ×40
SPLINT PLASTER CAST FAST 5X30 (CAST SUPPLIES) ×60 IMPLANT
SPONGE LAP 18X18 RF (DISPOSABLE) ×5 IMPLANT
SPONGE SURGIFOAM ABS GEL 12-7 (HEMOSTASIS) IMPLANT
STOCKINETTE 6  STRL (DRAPES) ×5
STOCKINETTE 6 STRL (DRAPES) ×3 IMPLANT
SUCTION FRAZIER HANDLE 10FR (MISCELLANEOUS) ×5
SUCTION TUBE FRAZIER 10FR DISP (MISCELLANEOUS) ×3 IMPLANT
SUT BONE WAX W31G (SUTURE) IMPLANT
SUT ETHIBOND 0 MO6 C/R (SUTURE) IMPLANT
SUT ETHIBOND 2 OS 4 DA (SUTURE) IMPLANT
SUT ETHILON 3 0 PS 1 (SUTURE) ×10 IMPLANT
SUT FIBERWIRE #2 38 T-5 BLUE (SUTURE)
SUT FIBERWIRE 2-0 18 17.9 3/8 (SUTURE)
SUT MERSILENE 2.0 SH NDLE (SUTURE) IMPLANT
SUT MNCRL AB 3-0 PS2 18 (SUTURE) ×5 IMPLANT
SUT VIC AB 0 SH 27 (SUTURE) IMPLANT
SUT VIC AB 2-0 SH 18 (SUTURE) IMPLANT
SUT VIC AB 2-0 SH 27 (SUTURE) ×5
SUT VIC AB 2-0 SH 27XBRD (SUTURE) ×3 IMPLANT
SUT VICRYL 0 UR6 27IN ABS (SUTURE) IMPLANT
SUTURE FIBERWR #2 38 T-5 BLUE (SUTURE) IMPLANT
SUTURE FIBERWR 2-0 18 17.9 3/8 (SUTURE) IMPLANT
SUTURE TAPE 1.3 FIBERLOP 20 ST (SUTURE) IMPLANT
SUTURETAPE 1.3 FIBERLOOP 20 ST (SUTURE)
SYR BULB EAR ULCER 3OZ GRN STR (SYRINGE) ×5 IMPLANT
SYR CONTROL 10ML LL (SYRINGE) IMPLANT
TOWEL GREEN STERILE FF (TOWEL DISPOSABLE) ×10 IMPLANT
TUBE CONNECTING 20'X1/4 (TUBING) ×1
TUBE CONNECTING 20X1/4 (TUBING) ×4 IMPLANT
UNDERPAD 30X36 HEAVY ABSORB (UNDERPADS AND DIAPERS) ×5 IMPLANT
YANKAUER SUCT BULB TIP NO VENT (SUCTIONS) IMPLANT

## 2020-06-25 NOTE — Anesthesia Procedure Notes (Signed)
Procedure Name: LMA Insertion Date/Time: 06/25/2020 9:13 AM Performed by: Signe Colt, CRNA Pre-anesthesia Checklist: Patient identified, Emergency Drugs available, Suction available and Patient being monitored Patient Re-evaluated:Patient Re-evaluated prior to induction Oxygen Delivery Method: Circle System Utilized Preoxygenation: Pre-oxygenation with 100% oxygen Induction Type: IV induction Ventilation: Mask ventilation without difficulty LMA: LMA inserted LMA Size: 4.0 Number of attempts: 1 Airway Equipment and Method: bite block Placement Confirmation: positive ETCO2 Tube secured with: Tape Dental Injury: Teeth and Oropharynx as per pre-operative assessment

## 2020-06-25 NOTE — Discharge Instructions (Addendum)
[name] Hewitt, MD [facility]  Please read the following information regarding your care after surgery.  Medications  You only need a prescription for the narcotic pain medicine (ex. oxycodone, Percocet, Norco).  All of the other medicines listed below are available over the counter. X Aleve 2 pills twice a day for the first 3 days after surgery. X acetominophen (Tylenol) 650 mg every 4-6 hours as you need for minor to moderate pain X oxycodone as prescribed for severe pain  Narcotic pain medicine (ex. oxycodone, Percocet, Vicodin) will cause constipation.  To prevent this problem, take the following medicines while you are taking any pain medicine. X docusate sodium (Colace) 100 mg twice a day X senna (Senokot) 2 tablets twice a day  X To help prevent blood clots, take a baby aspirin (81 mg) twice a day for six weeks after surgery.  You should also get up every hour while you are awake to move around.    Weight Bearing X Do not bear any weight on the operated leg or foot.  Cast / Splint / Dressing X Keep your splint, cast or dressing clean and dry.  Don't put anything (coat hanger, pencil, etc) down inside of it.  If it gets damp, use a hair dryer on the cool setting to dry it.  If it gets soaked, call the office to schedule an appointment for a cast change.   After your dressing, cast or splint is removed; you may shower, but do not soak or scrub the wound.  Allow the water to run over it, and then gently pat it dry.  Swelling It is normal for you to have swelling where you had surgery.  To reduce swelling and pain, keep your toes above your nose for at least 3 days after surgery.  It may be necessary to keep your foot or leg elevated for several weeks.  If it hurts, it should be elevated.  Follow Up Call my office at [phone] when you are discharged from the hospital or surgery center to schedule an appointment to be seen two weeks after surgery.  Call my office at [phone] if  you develop a fever >101.5 F, nausea, vomiting, bleeding from the surgical site or severe pain.     Post Anesthesia Home Care Instructions  Activity: Get plenty of rest for the remainder of the day. A responsible individual must stay with you for 24 hours following the procedure.  For the next 24 hours, DO NOT: -Drive a car -Operate machinery -Drink alcoholic beverages -Take any medication unless instructed by your physician -Make any legal decisions or sign important papers.  Meals: Start with liquid foods such as gelatin or soup. Progress to regular foods as tolerated. Avoid greasy, spicy, heavy foods. If nausea and/or vomiting occur, drink only clear liquids until the nausea and/or vomiting subsides. Call your physician if vomiting continues.  Special Instructions/Symptoms: Your throat may feel dry or sore from the anesthesia or the breathing tube placed in your throat during surgery. If this causes discomfort, gargle with warm salt water. The discomfort should disappear within 24 hours.  If you had a scopolamine patch placed behind your ear for the management of post- operative nausea and/or vomiting:  1. The medication in the patch is effective for 72 hours, after which it should be removed.  Wrap patch in a tissue and discard in the trash. Wash hands thoroughly with soap and water. 2. You may remove the patch earlier than 72 hours if you experience   unpleasant side effects which may include dry mouth, dizziness or visual disturbances. 3. Avoid touching the patch. Wash your hands with soap and water after contact with the patch.         Regional Anesthesia Blocks  1. Numbness or the inability to move the "blocked" extremity may last from 3-48 hours after placement. The length of time depends on the medication injected and your individual response to the medication. If the numbness is not going away after 48 hours, call your surgeon.  2. The extremity that is blocked will need to  be protected until the numbness is gone and the  Strength has returned. Because you cannot feel it, you will need to take extra care to avoid injury. Because it may be weak, you may have difficulty moving it or using it. You may not know what position it is in without looking at it while the block is in effect.  3. For blocks in the legs and feet, returning to weight bearing and walking needs to be done carefully. You will need to wait until the numbness is entirely gone and the strength has returned. You should be able to move your leg and foot normally before you try and bear weight or walk. You will need someone to be with you when you first try to ensure you do not fall and possibly risk injury.  4. Bruising and tenderness at the needle site are common side effects and will resolve in a few days.  5. Persistent numbness or new problems with movement should be communicated to the surgeon or the  Surgery Center ([phone])/ [facility] ([phone]).  

## 2020-06-25 NOTE — Op Note (Signed)
06/25/2020  10:49 AM  PATIENT:  [name] [name]  74 y.o. female  PRE-OPERATIVE DIAGNOSIS:  Left posterior tibial tendon dysfunction and tight heel cord (Acquired short left Achilles Tendon)  POST-OPERATIVE DIAGNOSIS:  Left posterior tibial tendon dysfunction and tight heel cord (Acquired short left Achilles Tendon); left spring ligament tear and talonavicular joint arthritis  Procedure(s): 1.  Left gastroc recession (separate incision) 2.  Left Medializing calcaneal osteotomy (separate incision) 3.  Left posterior tibial tenolysis 4.  Left Talonavicular arthrodesis 5.  AP, lateral and Harris heel xrays of the left foot  SURGEON:  Wylene Simmer, MD  ASSISTANT: Mechele Claude, PA-C  ANESTHESIA:   General, regional  EBL:  minimal   TOURNIQUET:   Total Tourniquet Time Documented: Thigh (Left) - 63 minutes Total: Thigh (Left) - 63 minutes  COMPLICATIONS:  None apparent  DISPOSITION:  Extubated, awake and stable to recovery.  INDICATION FOR PROCEDURE: The patient is a 74 year old female with posterior tibial tendon dysfunction.  She has a tight heel cord as well.  She has failed nonoperative treatment to date and presents for surgical correction of her painful hindfoot deformity.  The risks and benefits of the alternative treatment options have been discussed in detail.  The patient wishes to proceed with surgery and specifically understands risks of bleeding, infection, nerve damage, blood clots, need for additional surgery, amputation and death.  PROCEDURE IN DETAIL:  After pre operative consent was obtained, and the correct operative site was identified, the patient was brought to the operating room and placed supine on the OR table.  Anesthesia was administered.  Pre-operative antibiotics were administered.  A surgical timeout was taken.  The left lower extremity was prepped and draped in standard sterile fashion with a tourniquet around the thigh.  The extremity was elevated and the  tourniquet was inflated to 250 mmHg.  A longitudinal incision was made at the medial calf.  Dissection was carried down through the subcutaneous tissues.  The gastrocnemius tendon was identified.  Was divided from medial to lateral under direct vision taking care to protect the sural nerve.  The wound was irrigated and closed with Monocryl and nylon after sprinkling with vancomycin powder.  Attention was turned to the lateral hindfoot where an oblique incision was made over the lateral wall of the calcaneus.  Dissection was carried down through the subcutaneous tissues.  The periosteum was elevated.  The osteotomy was marked with a K wire.  The osteotomy was cut with the oscillating saw and the tuberosity mobilized.  It was translated medially and provisionally pinned.  Radiographs confirmed appropriate medialization of the tuberosity.  The osteotomy was then fixed with two 4 mm titanium partially-threaded screws from the [employer] 4 mm, 5 mm set.  The wounds were irrigated.  The lateral wall was impacted with a bone impactor to smooth the prominent ridge.  Vancomycin powder was sprinkled in the wounds and they were closed with nylon.  Attention was turned to the medial hindfoot where an incision was made along the posterior tibial tendon.  Dissection was carried down through the subcutaneous tissues and posterior tibial tendon sheath.  Sheath was incised and opened.  There was abundant synovitis and a completely torn posterior tibial tendon.  The tendon stump was debrided and the tendon sheath cleaned of all synovitis with a scalpel and scissors.  Once the tenolysis was complete the spring ligament was inspected.  It was completely torn exposing the talonavicular joint.  Joint was noted to have degenerative  changes.  The decision was made to proceed with arthrodesis of the talonavicular joint.  The plantar medial aspect of the joint was cleared of all articular cartilage with curettes and rondure.  A dorsal  accessory incision was made and dissection carried down through the subcutaneous tissues.  The joint capsule was incised and elevated exposing the dorsal lateral aspect of the joint.  This was cleared of all articular cartilage and subchondral bone.  The wound was irrigated copiously.  A drill bit was used to perforate both sides of the joint leaving the resultant bone graft in place.  The talonavicular joint was then reduced and provisionally pinned.  Radiographs confirmed appropriate alignment of the joint.  The joint was then compressed with a 5 mm partially-threaded cannulated screw.  A 20 mm arcus staple was then inserted from the dorsal lateral incision and impacted into position.  Final AP, lateral and Harris heel radiographs confirmed appropriate alignment of the arch and appropriate position and length of all hardware.  The wounds were irrigated copiously.  Vancomycin powder was sprinkled in the wounds.  The dorsal wound was closed with Monocryl and nylon.  The tendon sheath was repaired with Vicryl.  Subcutaneous tissues were approximated with Monocryl.  Skin incisions were closed with nylon.  Sterile dressings were applied followed by well-padded short leg splint.  The tourniquet was released after application of the dressings.  The patient was awakened from anesthesia and transported to the recovery room in stable condition.   FOLLOW UP PLAN: Nonweightbearing on the left lower extremity.  Follow-up in the office in 2 weeks for suture removal and conversion to a short leg cast.  Plan 6 weeks nonweightbearing immobilization.  Aspirin for DVT prophylaxis.   RADIOGRAPHS: AP, lateral and Harris heel radiographs of the left foot are obtained intraoperatively.  These show interval medialization of the calcaneus with appropriately positioned hardware.  Interval arthrodesis of the talonavicular joint is also noted.  The arch is appropriately aligned.  No other acute injuries are noted.    Mechele Claude PA-C was present and scrubbed for the duration of the operative case. His assistance was essential in positioning the patient, prepping and draping, gaining and maintaining exposure, performing the operation, closing and dressing the wounds and applying the splint.

## 2020-06-25 NOTE — Transfer of Care (Signed)
Immediate Anesthesia Transfer of Care Note  Patient: KYASIA STEUCK  Procedure(s) Performed: Left gastroc recession (Left Leg Lower) Posterior tibial tendon tenolysis, Flexor Digitorum Longus Tendon transfer to Navicular (Left Ankle) Medializing calcaneal osteotomy (Left Foot) Talonavicular arthrodesis (Left Foot)  Patient Location: PACU  Anesthesia Type:GA combined with regional for post-op pain  Level of Consciousness: drowsy and patient cooperative  Airway & Oxygen Therapy: Patient Spontanous Breathing and Patient connected to face mask oxygen  Post-op Assessment: Report given to RN and Post -op Vital signs reviewed and stable  Post vital signs: Reviewed and stable  Last Vitals:  Vitals Value Taken Time  BP    Temp    Pulse 76 06/25/20 1042  Resp 12 06/25/20 1042  SpO2 100 % 06/25/20 1042  Vitals shown include unvalidated device data.  Last Pain:  Vitals:   06/25/20 0749  TempSrc: Oral  PainSc: 8       Patients Stated Pain Goal: 6 (54/65/03 5465)  Complications: No complications documented.

## 2020-06-25 NOTE — Progress Notes (Signed)
Assisted Dr. Rose with left, ultrasound guided, popliteal block. Side rails up, monitors on throughout procedure. See vital signs in flow sheet. Tolerated Procedure well. 

## 2020-06-25 NOTE — H&P (Signed)
[name] [name] is an 74 y.o. female.   Chief Complaint:  Left foot pain HPI: The patient is a 74 year old female without significant past medical history.  She has a long history of left foot pain due to posterior tibial tendon dysfunction.  She has failed nonoperative treatment to date including activity modification, oral anti-inflammatories, bracing and therapy.  She presents now for operative treatment of this painful and limiting foot condition.  Past Medical History:  Diagnosis Date  . Acquired short Achilles tendon, left   . Anemia   . Anxiety   . Arthritis    hands and spine  . Blood transfusion without reported diagnosis   . Cervical dysplasia 1976  . Complication of anesthesia   . COPD (chronic obstructive pulmonary disease) ([place])   . Depression   . Diverticulitis of colon (without mention of hemorrhage)(562.11)   . Diverticulosis of colon (without mention of hemorrhage)   . Elevated cholesterol   . GERD (gastroesophageal reflux disease)    no meds now  . Heart murmur   . Hypertension    pt states she takes med for tachycardia NOT HTN -metoprolol taken for tachycardia  . Internal hemorrhoids without mention of complication   . Mitral valve disorders(424.0)   . MRSA infection   . Osteoporosis    spine  . Palpitations   . Pelvic adhesive disease   . Plaque psoriasis   . PONV (postoperative nausea and vomiting)   . Prediabetes   . Tachycardia    never had HTN  . Vitamin D deficiency     Past Surgical History:  Procedure Laterality Date  . ABDOMINAL HYSTERECTOMY  2000   TAH,BSO endometriosis, leiomyomata  . COLONOSCOPY    . [place]  . ESOPHAGOGASTRODUODENOSCOPY    . FINGER SURGERY    . GANGLION CYST EXCISION Right    hand   . GYNECOLOGIC CRYOSURGERY  1976  . HEMORRHOID SURGERY    . OOPHORECTOMY    . ROTATOR CUFF REPAIR Left   . ROTATOR CUFF REPAIR Right 2/15  . TYMPANOPLASTY Right   . UPPER GASTROINTESTINAL ENDOSCOPY       Family History  Problem Relation Age of Onset  . Diabetes Mother   . Hypertension Mother   . Melanoma Mother   . Diabetes Father   . Heart disease Father   . Hyperlipidemia Father   . Hypertension Father   . Cirrhosis Father        alcoholic  . Uterine cancer Sister        UTERINE  . Colon cancer Maternal Grandmother        dx in her 59's ?mets from lymphoma  . Stomach cancer Maternal Grandmother   . Lymphoma Maternal Grandmother   . Heart disease Maternal Grandfather   . Heart disease Paternal Grandmother   . Heart disease Paternal Grandfather   . Colon polyps Sister   . Esophageal cancer Neg Hx   . Rectal cancer Neg Hx   . Kidney disease Neg Hx   . Pancreatic cancer Neg Hx    Social History:  reports that she has been smoking cigarettes. She started smoking about 54 years ago. She has a 32.40 pack-year smoking history. She has never used smokeless tobacco. She reports current alcohol use. She reports that she does not use drugs.  Allergies:  Allergies  Allergen Reactions  . Bactrim [Sulfamethoxazole-Trimethoprim] Swelling    Face and eyes swollen and red.  Marland Kitchen Ketek [Telithromycin]  Unknown    Medications Prior to Admission  Medication Sig Dispense Refill  . atorvastatin (LIPITOR) 40 MG tablet Take 1 tablet 3 x /week for Cholesterol 38 tablet 3  . B Complex Vitamins (B COMPLEX PO) Take 1 tablet by mouth every other day.     . Cholecalciferol (VITAMIN D3) 5000 UNITS TABS Take 5,000 Units by mouth 2 (two) times daily.     . folic acid (FOLVITE) 1 MG tablet Take 1 mg by mouth daily.    . Magnesium 250 MG TABS Take 250 mg by mouth daily.     . methotrexate (RHEUMATREX) 2.5 MG tablet Take 2.5 mg by mouth once a week. Per patient takes 8 tablets once a week------reported on 01/02/2020    . metoprolol tartrate (LOPRESSOR) 25 MG tablet TAKE ONE TABLET BY MOUTH DAILY 90 tablet 1  . mirabegron ER (MYRBETRIQ) 50 MG TB24 tablet Take 50 mg by mouth daily.    . sertraline  (ZOLOFT) 100 MG tablet Take     1 tablet     Daily      for Mood 90 tablet 0    No results found for this or any previous visit (from the past 48 hour(s)). No results found.  Review of Systems no recent fever, chills, nausea, vomiting or changes in her appetite  Blood pressure (!) 91/51, pulse (!) 57, temperature 98.6 F (37 C), temperature source Oral, resp. rate 18, height 5\' 4"  (1.626 m), weight 63.6 kg, SpO2 99 %. Physical Exam  Well-nourished well-developed woman in no apparent distress.  Alert and oriented x4.  Normal mood and affect.  Gait is antalgic to the left.  She has some collapse of the longitudinal arch.  Heel cord is tight.  No lymphadenopathy.  Pulses are palpable.  Assessment/Plan Left posterior tibial tendon dysfunction and tight heel cord -to the operating room today for gastrocnemius recession, calcaneal osteotomy, FDL transfer to the navicular and possible spring ligament repair versus talonavicular joint arthrodesis.  The risks and benefits of the alternative treatment options have been discussed in detail.  The patient wishes to proceed with surgery and specifically understands risks of bleeding, infection, nerve damage, blood clots, need for additional surgery, amputation and death.   Wylene Simmer, MD July 17, 2020, 9:09 AM

## 2020-06-25 NOTE — Anesthesia Procedure Notes (Signed)
Anesthesia Regional Block: Popliteal block   Pre-Anesthetic Checklist: ,, timeout performed, Correct Patient, Correct Site, Correct Laterality, Correct Procedure, Correct Position, site marked, Risks and benefits discussed,  Surgical consent,  Pre-op evaluation,  At surgeon's request and post-op pain management  Laterality: Left  Prep: chloraprep       Needles:  Injection technique: Single-shot  Needle Type: Echogenic Needle     Needle Length: 9cm      Additional Needles:   Procedures:,,,, ultrasound used (permanent image in chart),,,,  Narrative:  Start time: 06/25/2020 8:11 AM End time: 06/25/2020 8:18 AM Injection made incrementally with aspirations every 5 mL.  Performed by: Personally  Anesthesiologist: Myrtie Soman, MD  Additional Notes: Patient tolerated the procedure well without complications

## 2020-06-25 NOTE — Anesthesia Procedure Notes (Signed)
Anesthesia Procedure Image    

## 2020-06-25 NOTE — Anesthesia Postprocedure Evaluation (Signed)
Anesthesia Post Note  Patient: [name] [name]  Procedure(s) Performed: Left gastroc recession (Left Leg Lower) Medializing calcaneal osteotomy (Left Foot) Talonavicular arthrodesis (Left Foot)     Patient location during evaluation: PACU Anesthesia Type: General Level of consciousness: awake and alert Pain management: pain level controlled Vital Signs Assessment: post-procedure vital signs reviewed and stable Respiratory status: spontaneous breathing, nonlabored ventilation, respiratory function stable and patient connected to nasal cannula oxygen Cardiovascular status: blood pressure returned to baseline and stable Postop Assessment: no apparent nausea or vomiting Anesthetic complications: no   No complications documented.  Last Vitals:  Vitals:   06/25/20 1130 06/25/20 1150  BP: 122/64 134/73  Pulse: 78 80  Resp: 16 18  Temp:  36.8 C  SpO2: 93% 99%    Last Pain:  Vitals:   06/25/20 1113  TempSrc:   PainSc: 5                  [name]

## 2020-06-25 NOTE — Anesthesia Preprocedure Evaluation (Addendum)
Anesthesia Evaluation  Patient identified by MRN, date of birth, ID band Patient awake    Reviewed: Allergy & Precautions, NPO status , Patient's Chart, lab work & pertinent test results  History of Anesthesia Complications (+) PONV  Airway Mallampati: II  TM Distance: >3 FB Neck ROM: Full    Dental no notable dental hx.    Pulmonary COPD, Current Smoker,    Pulmonary exam normal breath sounds clear to auscultation       Cardiovascular hypertension, Pt. on medications and Pt. on home beta blockers Normal cardiovascular exam Rhythm:Regular Rate:Normal     Neuro/Psych negative neurological ROS  negative psych ROS   GI/Hepatic negative GI ROS, Neg liver ROS,   Endo/Other  negative endocrine ROS  Renal/GU negative Renal ROS  negative genitourinary   Musculoskeletal negative musculoskeletal ROS (+)   Abdominal   Peds negative pediatric ROS (+)  Hematology negative hematology ROS (+)   Anesthesia Other Findings   Reproductive/Obstetrics negative OB ROS                            Anesthesia Physical Anesthesia Plan  ASA: III  Anesthesia Plan: General   Post-op Pain Management:  Regional for Post-op pain   Induction: Intravenous  PONV Risk Score and Plan: 3 and Ondansetron, Dexamethasone and Treatment may vary due to age or medical condition  Airway Management Planned: LMA  Additional Equipment:   Intra-op Plan:   Post-operative Plan: Extubation in OR  Informed Consent: I have reviewed the patients History and Physical, chart, labs and discussed the procedure including the risks, benefits and alternatives for the proposed anesthesia with the patient or authorized representative who has indicated his/her understanding and acceptance.     Dental advisory given  Plan Discussed with: CRNA and Surgeon  Anesthesia Plan Comments:         Anesthesia Quick Evaluation

## 2020-06-26 ENCOUNTER — Encounter (HOSPITAL_BASED_OUTPATIENT_CLINIC_OR_DEPARTMENT_OTHER): Payer: Self-pay | Admitting: Orthopedic Surgery

## 2020-06-27 DIAGNOSIS — M25572 Pain in left ankle and joints of left foot: Secondary | ICD-10-CM | POA: Diagnosis not present

## 2020-06-27 DIAGNOSIS — F329 Major depressive disorder, single episode, unspecified: Secondary | ICD-10-CM | POA: Diagnosis not present

## 2020-06-27 DIAGNOSIS — M6702 Short Achilles tendon (acquired), left ankle: Secondary | ICD-10-CM | POA: Diagnosis not present

## 2020-06-27 DIAGNOSIS — M79672 Pain in left foot: Secondary | ICD-10-CM | POA: Diagnosis not present

## 2020-06-27 DIAGNOSIS — M76822 Posterior tibial tendinitis, left leg: Secondary | ICD-10-CM | POA: Diagnosis not present

## 2020-06-27 DIAGNOSIS — J449 Chronic obstructive pulmonary disease, unspecified: Secondary | ICD-10-CM | POA: Diagnosis not present

## 2020-06-27 DIAGNOSIS — R262 Difficulty in walking, not elsewhere classified: Secondary | ICD-10-CM | POA: Diagnosis not present

## 2020-06-27 DIAGNOSIS — M19072 Primary osteoarthritis, left ankle and foot: Secondary | ICD-10-CM | POA: Diagnosis not present

## 2020-06-30 DIAGNOSIS — R262 Difficulty in walking, not elsewhere classified: Secondary | ICD-10-CM | POA: Diagnosis not present

## 2020-06-30 DIAGNOSIS — J449 Chronic obstructive pulmonary disease, unspecified: Secondary | ICD-10-CM | POA: Diagnosis not present

## 2020-06-30 DIAGNOSIS — M6702 Short Achilles tendon (acquired), left ankle: Secondary | ICD-10-CM | POA: Diagnosis not present

## 2020-06-30 DIAGNOSIS — M25572 Pain in left ankle and joints of left foot: Secondary | ICD-10-CM | POA: Diagnosis not present

## 2020-06-30 DIAGNOSIS — F329 Major depressive disorder, single episode, unspecified: Secondary | ICD-10-CM | POA: Diagnosis not present

## 2020-06-30 DIAGNOSIS — M79672 Pain in left foot: Secondary | ICD-10-CM | POA: Diagnosis not present

## 2020-06-30 DIAGNOSIS — M19072 Primary osteoarthritis, left ankle and foot: Secondary | ICD-10-CM | POA: Diagnosis not present

## 2020-06-30 DIAGNOSIS — M76822 Posterior tibial tendinitis, left leg: Secondary | ICD-10-CM | POA: Diagnosis not present

## 2020-07-08 DIAGNOSIS — M19072 Primary osteoarthritis, left ankle and foot: Secondary | ICD-10-CM | POA: Diagnosis not present

## 2020-07-08 DIAGNOSIS — M76822 Posterior tibial tendinitis, left leg: Secondary | ICD-10-CM | POA: Diagnosis not present

## 2020-07-08 DIAGNOSIS — Z4889 Encounter for other specified surgical aftercare: Secondary | ICD-10-CM | POA: Diagnosis not present

## 2020-07-30 DIAGNOSIS — M79672 Pain in left foot: Secondary | ICD-10-CM | POA: Diagnosis not present

## 2020-07-30 DIAGNOSIS — M25572 Pain in left ankle and joints of left foot: Secondary | ICD-10-CM | POA: Diagnosis not present

## 2020-07-30 DIAGNOSIS — M19072 Primary osteoarthritis, left ankle and foot: Secondary | ICD-10-CM | POA: Diagnosis not present

## 2020-07-30 DIAGNOSIS — M76822 Posterior tibial tendinitis, left leg: Secondary | ICD-10-CM | POA: Diagnosis not present

## 2020-07-30 DIAGNOSIS — J449 Chronic obstructive pulmonary disease, unspecified: Secondary | ICD-10-CM | POA: Diagnosis not present

## 2020-07-30 DIAGNOSIS — R262 Difficulty in walking, not elsewhere classified: Secondary | ICD-10-CM | POA: Diagnosis not present

## 2020-07-30 DIAGNOSIS — Z4789 Encounter for other orthopedic aftercare: Secondary | ICD-10-CM | POA: Diagnosis not present

## 2020-07-30 DIAGNOSIS — F329 Major depressive disorder, single episode, unspecified: Secondary | ICD-10-CM | POA: Diagnosis not present

## 2020-08-03 ENCOUNTER — Encounter: Payer: PPO | Admitting: Physician Assistant

## 2020-08-07 DIAGNOSIS — Z4789 Encounter for other orthopedic aftercare: Secondary | ICD-10-CM | POA: Diagnosis not present

## 2020-08-07 DIAGNOSIS — M79672 Pain in left foot: Secondary | ICD-10-CM | POA: Diagnosis not present

## 2020-08-07 DIAGNOSIS — M6702 Short Achilles tendon (acquired), left ankle: Secondary | ICD-10-CM | POA: Diagnosis not present

## 2020-08-12 ENCOUNTER — Encounter: Payer: Self-pay | Admitting: Adult Health Nurse Practitioner

## 2020-08-12 ENCOUNTER — Other Ambulatory Visit: Payer: Self-pay

## 2020-08-12 ENCOUNTER — Ambulatory Visit (INDEPENDENT_AMBULATORY_CARE_PROVIDER_SITE_OTHER): Payer: PPO | Admitting: Adult Health Nurse Practitioner

## 2020-08-12 VITALS — BP 122/70 | HR 65 | Temp 97.5°F | Ht 64.0 in | Wt 143.0 lb

## 2020-08-12 DIAGNOSIS — E559 Vitamin D deficiency, unspecified: Secondary | ICD-10-CM

## 2020-08-12 DIAGNOSIS — I10 Essential (primary) hypertension: Secondary | ICD-10-CM | POA: Diagnosis not present

## 2020-08-12 DIAGNOSIS — L4050 Arthropathic psoriasis, unspecified: Secondary | ICD-10-CM | POA: Diagnosis not present

## 2020-08-12 DIAGNOSIS — E782 Mixed hyperlipidemia: Secondary | ICD-10-CM

## 2020-08-12 DIAGNOSIS — I059 Rheumatic mitral valve disease, unspecified: Secondary | ICD-10-CM

## 2020-08-12 DIAGNOSIS — I700 Atherosclerosis of aorta: Secondary | ICD-10-CM

## 2020-08-12 DIAGNOSIS — R002 Palpitations: Secondary | ICD-10-CM

## 2020-08-12 DIAGNOSIS — Z79899 Other long term (current) drug therapy: Secondary | ICD-10-CM

## 2020-08-12 DIAGNOSIS — Z23 Encounter for immunization: Secondary | ICD-10-CM | POA: Diagnosis not present

## 2020-08-12 DIAGNOSIS — R7309 Other abnormal glucose: Secondary | ICD-10-CM

## 2020-08-12 DIAGNOSIS — Z136 Encounter for screening for cardiovascular disorders: Secondary | ICD-10-CM | POA: Diagnosis not present

## 2020-08-12 DIAGNOSIS — J439 Emphysema, unspecified: Secondary | ICD-10-CM

## 2020-08-12 DIAGNOSIS — D649 Anemia, unspecified: Secondary | ICD-10-CM

## 2020-08-12 DIAGNOSIS — F3341 Major depressive disorder, recurrent, in partial remission: Secondary | ICD-10-CM

## 2020-08-12 DIAGNOSIS — K5730 Diverticulosis of large intestine without perforation or abscess without bleeding: Secondary | ICD-10-CM

## 2020-08-12 DIAGNOSIS — L409 Psoriasis, unspecified: Secondary | ICD-10-CM

## 2020-08-12 DIAGNOSIS — F17210 Nicotine dependence, cigarettes, uncomplicated: Secondary | ICD-10-CM

## 2020-08-12 DIAGNOSIS — Z0000 Encounter for general adult medical examination without abnormal findings: Secondary | ICD-10-CM

## 2020-08-12 DIAGNOSIS — R300 Dysuria: Secondary | ICD-10-CM

## 2020-08-12 NOTE — Progress Notes (Signed)
COMPLETE PHYSICAL  Assessment:   Encounter for routine medical examination with abnormal findings Schedule mammogram, phone number given Due annually    Mixed hyperlipidemia Continue statin -continue medications, check lipids, decrease fatty foods, increase activity.   Tachycardia Taking metoprolol for rate control  Atherosclerosis of aorta Per CT 05/19/20 Control blood pressure, cholesterol, glucose, increase exercise.   Emphysema STOP SMOKING, uses inhaler PRN with flares, rare Current smoker on some days Getting annual CT lung cancer screening, had 04/2020, nodule, follow up ordered Smoking cessation- 3-5 min instruction/counseling given, counseled patient on the dangers of tobacco use, advised patient to stop smoking, and reviewed strategies to maximize success, patient not ready to quit at this time.   Hx of prediabetes / abnormal glucose Discussed general issues about diabetes pathophysiology and management., Educational material distributed., Suggested low cholesterol diet., Encouraged aerobic exercise., Discussed foot care., Reminded to get yearly retinal exam.   Vitamin D deficiency Continue supplement  Psoriatic arthritis/Cervical arthritis ([place]) Dr. Amil Amen following, now on methotrexate    Diverticulosus of colon  Bowel management; add fiber if not taking   Mitral valve disorder No SOB, CP, edema, monitor  Medication management Continued  Estrogen deficiency Dexa in 2023  Depression  stress management techniques discussed, increase water, good sleep hygiene discussed, increase exercise, and increase veggies.  - will continue zoloft for now, successfully tapered off of benzo  Anemia  Psoriasis Follows with derm; improved with methotrexate    Future Appointments  Date Time Provider [place]  09/08/2020  2:15 PM Josue Hector, MD [facility]  11/18/2020  4:00 PM Unk Pinto, MD [facility] None  05/13/2021 11:15 AM  Liane Comber, NP [facility] None  08/12/2021  2:00 PM [name], NP [facility] None     Subjective:   [name] [name] is a 74 y.o. female who presents for complete physical and follow up for HTN, chol, preDM, and depression.   She has follow up with Ortho in four weeks.  Recently has cast removed and hard boot.  She is ambulating with walker. She was previous using a knee scooter.  She has started PT twice a week.  She is now taking a break until the boot comes off in four week.   She is going to the urologist on Friday for follow up on mybetriq.  She reports this has been helping.  Reports kt has almost completley resolved.  She was having leaking of urine.  She is on Ativan for sleep and zoloft for depression; successfully tapered off of ativan this year, reports sleeping well. Has occasional down day as her mom passed away around this time in 2019.   She smokes 1 pack of cigarettes/month - on the weekend with a bottle of wine.  Has emphysema by imaging, asymptomatic, no perceived benefit with inhalers Hx of 30+ pack year history, had CT lung 04/2020 with small nodule recommended for annual screening.   Psoriatic arthritis/cervical arthritis, followed by Dr. Amil Amen, now on low dose methotrexate 0.25 mg 8 tabs per week, psoriasis is resolved but has residual L ankle pain since June despite steroid taper, followed up Aug 19, felt possible tendonitis and referred to ortho, did steroid injection but didn't help, trying to get back in with her regular ortho Dr. Onnie Graham, hasn't been taking antiinflammatory, doing RICE.   BMI is Body mass index is 24.55 kg/m., she has not been working on diet and exercise; she could walk 20 min walk if the weather was less hot near her  home and plans to start daily in another few weeks Wt Readings from Last 3 Encounters:  08/12/20 143 lb (64.9 kg)  06/25/20 140 lb 3.4 oz (63.6 kg)  05/05/20 139 lb (63 kg)   She has aortic atherosclerosis per CT  08/2018 Follows with Dr. 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  She denies chest pain, shortness of breath, dizziness.   She is on cholesterol medication (atorvastatin 40 mg three times/ week) and denies myalgias. Her cholesterol is at goal. The cholesterol last visit was:   Lab Results  Component Value Date   CHOL 178 08/12/2020   HDL 71 08/12/2020   LDLCALC 88 08/12/2020   TRIG 92 08/12/2020   CHOLHDL 2.5 08/12/2020   She has been working on diet and exercise for hx of prediabetes recently well controlled, and denies paresthesia of the feet, polydipsia, polyuria and visual disturbances. Last A1C in the office was:  Lab Results  Component Value Date   HGBA1C 5.3 01/02/2020    Lab Results  Component Value Date   GFRNONAA 68 08/12/2020   Patient is on Vitamin D supplement. Lab Results  Component Value Date   VD25OH 81 01/02/2020     Medication Review Current Outpatient Medications on File Prior to Visit  Medication Sig Dispense Refill  . aspirin EC 81 MG tablet Take 1 tablet (81 mg total) by mouth 2 (two) times daily. 84 tablet 0  . atorvastatin (LIPITOR) 40 MG tablet Take 1 tablet 3 x /week for Cholesterol 38 tablet 3  . B Complex Vitamins (B COMPLEX PO) Take 1 tablet by mouth every other day.     . Cholecalciferol (VITAMIN D3) 5000 UNITS TABS Take 5,000 Units by mouth 2 (two) times daily.    . folic acid (FOLVITE) 1 MG tablet Take 1 mg by mouth daily.    . Magnesium 250 MG TABS Take 250 mg by mouth daily.     . methotrexate (RHEUMATREX) 2.5 MG tablet Take 2.5 mg by mouth once a week. Per patient takes 8 tablets once a week------reported on 01/02/2020    . metoprolol tartrate (LOPRESSOR) 25 MG tablet TAKE ONE TABLET BY MOUTH DAILY 90 tablet 1  . mirabegron ER (MYRBETRIQ) 50 MG TB24 tablet  Take 50 mg by mouth daily.    . sertraline (ZOLOFT) 100 MG tablet Take     1 tablet     Daily      for Mood 90 tablet 0   No current facility-administered medications on file prior to visit.    Current Problems (verified) Patient Active Problem List   Diagnosis Date Noted  . Anemia 05/06/2020  . Current smoker on some days 04/25/2019  . Depression, major, recurrent, in partial remission ([place]) 07/31/2018  . Pulmonary emphysema ([place]) 06/13/2017  . Atherosclerosis of aorta ([place]) 06/13/2017  . Psoriatic arthritis ([place]) 05/25/2016  . Hereditary and idiopathic peripheral neuropathy 05/25/2016  . Encounter for Medicare annual wellness exam 04/27/2015  . Cervical arthritis ([place]) 10/10/2014  . Medication management 10/02/2014  . Mixed hyperlipidemia 07/01/2014  . Hypertension   . Other abnormal glucose   . Vitamin D deficiency   . Mitral valve disorder 06/16/2009  . Diverticulosis of colon 05/06/2008    Screening Tests Immunization History  Administered Date(s) Administered  . DT (Pediatric) 04/27/2015  .  Influenza, High Dose Seasonal PF 07/10/2019, 08/12/2020  . PFIZER SARS-COV-2 Vaccination 10/11/2019, 11/03/2019, 06/05/2020  . Pneumococcal Conjugate-13 04/27/2015  . Pneumococcal Polysaccharide-23 12/22/2004, 11/24/2016  . Rabies, IM 05/04/2016, 05/07/2016  . Td 12/16/2003   Preventative care: Last colonoscopy: 12/2016, 5 year follow up, 2023 Due EGD 02/2015 Last mammogram: 04/2020  Last pap smear/pelvic exam: 11/2012 , 1 abnormal in her 20's, declines another DEXA: at OB/GYN-08/2015 normal DUE? Echo 2009 normal EF Ct AB 11/2014 Ct chest: 08/2018, small R nodule, follow up ordered  CXR: 2018 MRI abd: 08/2018 - benign renal cyst   Prior vaccinations: TD or Tdap: 2016 Influenza: 07/2020 Pneumococcal: 2018 Prevnar 13: 2016 Shingles/Zostavax: declines Covid 19: 2/2, 2021, pfizer , Booster 06/05/2020  Names of Other Physician/Practitioners you currently use: 1. [facility]- here for primary care 2. Dr. Sabra Heck, eye doctor, last visit 2021, mild cataracts monitoring  3. Dr. Rona Ravens, dentist, last visit 2021, q2m 4. Dr. Renda Rolls, derm, last visit 2021, goes annually  Patient Care Team: Unk Pinto, MD as PCP - General (Internal Medicine) Josue Hector, MD as PCP - Cardiology (Cardiology) Justice Britain, MD as Consulting Physician (Orthopedic Surgery) Iran Planas, MD as Consulting Physician (Orthopedic Surgery) Thornell Sartorius, MD as Consulting Physician (Otolaryngology) Josue Hector, MD as Consulting Physician (Cardiology) Rana Snare, MD as Consulting Physician (Urology) Phineas Real, Belinda Block, MD (Inactive) as Consulting Physician (Gynecology) Renda Rolls, Jennefer Bravo, MD as Referring Physician (Dermatology) Marica Otter, OD (Optometry) Merrilee Jansky , DDS   Allergies Allergies  Allergen Reactions  . Bactrim [Sulfamethoxazole-Trimethoprim] Swelling    Face and eyes swollen and red.  Marland Kitchen Ketek [Telithromycin]     Unknown    SURGICAL HISTORY She  has a past surgical history that includes Tympanoplasty (Right); Rotator cuff repair (Left); Ectopic pregnancy surgery (1971, 1979); Hemorrhoid surgery; Oophorectomy; Gynecologic cryosurgery (1976); Abdominal hysterectomy (2000); Rotator cuff repair (Right, 2/15); Colonoscopy; Esophagogastroduodenoscopy; Upper gastrointestinal endoscopy; Ganglion cyst excision (Right); Finger surgery; Gastrocnemius Recession (Left, 06/25/2020); Calcaneal osteotomy (Left, 06/25/2020); Tenolysis (Left, 06/25/2020); and Foot arthrodesis (Left, 06/25/2020). FAMILY HISTORY Her family history includes Cirrhosis in her father; Colon cancer in her maternal grandmother; Colon polyps in her sister; Diabetes in her father and mother; Heart disease in her father, maternal grandfather, paternal grandfather, and paternal grandmother; Hyperlipidemia in her father; Hypertension in her father and  mother; Lymphoma in her maternal grandmother; Melanoma in her mother; Stomach cancer in her maternal grandmother; Uterine cancer in her sister. SOCIAL HISTORY She  reports that she has been smoking cigarettes. She started smoking about 55 years ago. She has a 32.40 pack-year smoking history. She has never used smokeless tobacco. She reports current alcohol use. She reports that she does not use drugs.   Objective:   Blood pressure 122/70, pulse 65, temperature (!) 97.5 F (36.4 C), height 5\' 4"  (1.626 m), weight 143 lb (64.9 kg), SpO2 98 %. Body mass index is 24.55 kg/m.  General appearance: alert, no distress, WD/WN,  female HEENT: normocephalic, sclerae anicteric, TMs pearly, nares patent, no discharge or erythema, pharynx normal, right inner nostril at septum with small tender nodule, non fluctuant  Oral cavity: MMM, no lesions Neck: supple, no lymphadenopathy, no thyromegaly, no masses Heart: RRR, bradycardia, no murmurs, clicks, rubs, gallop, normal S1, S2 Lungs: CTA bilaterally, no wheezes, rhonchi, or rales Abdomen: +bs, soft, non tender, no hepatomegaly, no splenomegaly Musculoskeletal: left ankle with mild swelling, heat, tenderness with palpation below malleolus, full ROM without laxity or pain; non-antalgic gait  Extremities:  no cyanosis, no clubbing Pulses: 2+ symmetric, upper and lower extremities, normal cap refill Derm: warm, dry; she has plaque to right foot dorsum, approx 5 cm area, another to R lateral ankle Neurological: alert, oriented x 3, CN2-12 intact, strength normal upper extremities and lower extremities, sensation normal throughout, DTRs 2+ throughout, no cerebellar signs, gait normal Psychiatric: normal affect, behavior normal, pleasant    Bayard Males, DNP [facility] 08/12/2020  15:37 PM

## 2020-08-13 LAB — COMPLETE METABOLIC PANEL WITH GFR
AG Ratio: 1.9 (calc) (ref 1.0–2.5)
ALT: 9 U/L (ref 6–29)
AST: 11 U/L (ref 10–35)
Albumin: 4.1 g/dL (ref 3.6–5.1)
Alkaline phosphatase (APISO): 76 U/L (ref 37–153)
BUN: 16 mg/dL (ref 7–25)
CO2: 27 mmol/L (ref 20–32)
Calcium: 9.3 mg/dL (ref 8.6–10.4)
Chloride: 104 mmol/L (ref 98–110)
Creat: 0.85 mg/dL (ref 0.60–0.93)
GFR, Est African American: 79 mL/min/{1.73_m2} (ref >=60)
GFR, Est Non African American: 68 mL/min/{1.73_m2} (ref >=60)
Globulin: 2.2 g/dL (calc) (ref 1.9–3.7)
Glucose, Bld: 94 mg/dL (ref 65–99)
Potassium: 4.4 mmol/L (ref 3.5–5.3)
Sodium: 139 mmol/L (ref 135–146)
Total Bilirubin: 0.6 mg/dL (ref 0.2–1.2)
Total Protein: 6.3 g/dL (ref 6.1–8.1)

## 2020-08-13 LAB — LIPID PANEL
Cholesterol: 178 mg/dL (ref <200)
HDL: 71 mg/dL (ref >=50)
LDL Cholesterol (Calc): 88 mg/dL (calc)
Non-HDL Cholesterol (Calc): 107 mg/dL (calc) (ref <130)
Total CHOL/HDL Ratio: 2.5 (calc) (ref <5.0)
Triglycerides: 92 mg/dL (ref <150)

## 2020-08-13 LAB — CBC WITH DIFFERENTIAL/PLATELET
Absolute Monocytes: 328 cells/uL (ref 200–950)
Basophils Absolute: 40 cells/uL (ref 0–200)
Basophils Relative: 0.6 %
Eosinophils Absolute: 221 cells/uL (ref 15–500)
Eosinophils Relative: 3.3 %
HCT: 32.1 % — ABNORMAL LOW (ref 35.0–45.0)
Hemoglobin: 10.6 g/dL — ABNORMAL LOW (ref 11.7–15.5)
Lymphs Abs: 1903 cells/uL (ref 850–3900)
MCH: 32.1 pg (ref 27.0–33.0)
MCHC: 33 g/dL (ref 32.0–36.0)
MCV: 97.3 fL (ref 80.0–100.0)
MPV: 9.5 fL (ref 7.5–12.5)
Monocytes Relative: 4.9 %
Neutro Abs: 4208 cells/uL (ref 1500–7800)
Neutrophils Relative %: 62.8 %
Platelets: 256 10*3/uL (ref 140–400)
RBC: 3.3 10*6/uL — ABNORMAL LOW (ref 3.80–5.10)
RDW: 12.8 % (ref 11.0–15.0)
Total Lymphocyte: 28.4 %
WBC: 6.7 10*3/uL (ref 3.8–10.8)

## 2020-08-19 ENCOUNTER — Telehealth: Payer: Self-pay | Admitting: *Deleted

## 2020-08-19 DIAGNOSIS — L4059 Other psoriatic arthropathy: Secondary | ICD-10-CM | POA: Diagnosis not present

## 2020-08-19 DIAGNOSIS — E663 Overweight: Secondary | ICD-10-CM | POA: Diagnosis not present

## 2020-08-19 DIAGNOSIS — Z6825 Body mass index (BMI) 25.0-25.9, adult: Secondary | ICD-10-CM | POA: Diagnosis not present

## 2020-08-19 DIAGNOSIS — M25572 Pain in left ankle and joints of left foot: Secondary | ICD-10-CM | POA: Diagnosis not present

## 2020-08-19 NOTE — Telephone Encounter (Signed)
Most recent labs faxed to [facility].

## 2020-09-02 NOTE — Progress Notes (Signed)
Cardiology Office Note   Date:  09/08/2020   ID:  [name] [name], DOB 10/08/1945, MRN [id number]  PCP:  [name] Pinto, MD  Cardiologist:  [name]. Johnsie Cancel, MD   No chief complaint on file.  History of Present Illness: [name] [name] is a 75 y.o. female with history of palpitations benign PACls, smoking  HTN and HLD Palpitations have been fine on beta blockade She takes low dose methotrexate for plaque psoriasis under supervision of rheumatologist   Had surgery on left posterior tibial tendon on 06/25/20 Still in boot F/U [name] [name] tomorrow with [name] [name]  She has not smoked since surgery and is drinking less   She had lung cancer screening CT with no cancer on 05/18/20   No cardiac symptoms  Has had vaccine and booster     Past Medical History:  Diagnosis Date   Acquired short Achilles tendon, left    Anemia    Anxiety    Arthritis    hands and spine   Blood transfusion without reported diagnosis    Cervical dysplasia 99991111   Complication of anesthesia    COPD (chronic obstructive pulmonary disease) ([place])    Depression    Diverticulitis of colon (without mention of hemorrhage)(562.11)    Diverticulosis of colon (without mention of hemorrhage)    Elevated cholesterol    GERD (gastroesophageal reflux disease)    no meds now   Heart murmur    Hypertension    pt states she takes med for tachycardia NOT HTN -metoprolol taken for tachycardia   Internal hemorrhoids without mention of complication    Mitral valve disorders(424.0)    MRSA infection    Osteoporosis    spine   Palpitations    Pelvic adhesive disease    Plaque psoriasis    PONV (postoperative nausea and vomiting)    Prediabetes    Tachycardia    never had HTN   Vitamin D deficiency     Past Surgical History:  Procedure Laterality Date   ABDOMINAL HYSTERECTOMY  2000   TAH,BSO endometriosis, leiomyomata   CALCANEAL OSTEOTOMY Left 06/25/2020   Procedure: Medializing  calcaneal osteotomy;  Surgeon: [name] Simmer, MD;  Location: [facility];  Service: Orthopedics;  Laterality: Left;   COLONOSCOPY     ECTOPIC PREGNANCY SURGERY  1971, 1979   ESOPHAGOGASTRODUODENOSCOPY     FINGER SURGERY     FOOT ARTHRODESIS Left 06/25/2020   Procedure: Talonavicular Arthrodesis;  Surgeon: [name] Simmer, MD;  Location: [facility];  Service: Orthopedics;  Laterality: Left;   GANGLION CYST EXCISION Right    hand    GASTROCNEMIUS RECESSION Left 06/25/2020   Procedure: Left gastroc recession;  Surgeon: [name] Simmer, MD;  Location: [facility];  Service: Orthopedics;  Laterality: Left;   GYNECOLOGIC CRYOSURGERY  1976   HEMORRHOID SURGERY     OOPHORECTOMY     ROTATOR CUFF REPAIR Left    ROTATOR CUFF REPAIR Right 2/15   TENOLYSIS Left 06/25/2020   Procedure: Left Posterior Tibial Tenolysis;  Surgeon: [name] Simmer, MD;  Location: [place];  Service: Orthopedics;  Laterality: Left;   TYMPANOPLASTY Right    UPPER GASTROINTESTINAL ENDOSCOPY       Current Outpatient Medications  Medication Sig Dispense Refill   aspirin EC 81 MG tablet Take 1 tablet (81 mg total) by mouth 2 (two) times daily. 84 tablet 0   atorvastatin (LIPITOR) 40 MG tablet Take 1 tablet 3 x /week for Cholesterol  39 tablet 0   B Complex Vitamins (B COMPLEX PO) Take 1 tablet by mouth every other day.      Cholecalciferol (VITAMIN D3) 5000 UNITS TABS Take 5,000 Units by mouth 2 (two) times daily.     folic acid (FOLVITE) 1 MG tablet Take 1 mg by mouth daily.     Magnesium 250 MG TABS Take 250 mg by mouth daily.      methotrexate (RHEUMATREX) 2.5 MG tablet Take 2.5 mg by mouth once a week. Per patient takes 8 tablets once a week------reported on 01/02/2020     metoprolol tartrate (LOPRESSOR) 25 MG tablet TAKE ONE TABLET BY MOUTH DAILY 90 tablet 1   mirabegron ER (MYRBETRIQ) 50 MG TB24 tablet Take 50 mg by mouth daily.     sertraline  (ZOLOFT) 100 MG tablet Take     1 tablet     Daily      for Mood 90 tablet 0   No current facility-administered medications for this visit.    Allergies:   Bactrim [sulfamethoxazole-trimethoprim] and Ketek [telithromycin]    Social History:  The patient  reports that she has been smoking cigarettes. She started smoking about 55 years ago. She has a 32.40 pack-year smoking history. She has never used smokeless tobacco. She reports current alcohol use. She reports that she does not use drugs.   Family History:  The patient's family history includes Cirrhosis in her father; Colon cancer in her maternal grandmother; Colon polyps in her sister; Diabetes in her father and mother; Heart disease in her father, maternal grandfather, paternal grandfather, and paternal grandmother; Hyperlipidemia in her father; Hypertension in her father and mother; Lymphoma in her maternal grandmother; Melanoma in her mother; Stomach cancer in her maternal grandmother; Uterine cancer in her sister.    ROS:  Please see the history of present illness. Otherwise, review of systems are positive for none.   All other systems are reviewed and negative.    PHYSICAL EXAM: VS:  BP (!) 100/56    Pulse 65    Ht 5\' 4"  (1.626 m)    Wt 64.9 kg    SpO2 96%    BMI 24.55 kg/m  , BMI Body mass index is 24.55 kg/m.   Affect appropriate Healthy:  appears stated age HEENT: normal Neck supple with no adenopathy JVP normal no bruits no thyromegaly Lungs clear with no wheezing and good diaphragmatic motion Heart:  S1/S2 no murmur, no rub, gallop or click PMI normal Abdomen: benighn, BS positve, no tenderness, no AAA no bruit.  No HSM or HJR Distal pulses intact with no bruits No edema Neuro non-focal Skin warm and dry Left LE in boot     EKG:  08/12/20 SR rate 60 normal   Recent Labs: 05/05/2020: Magnesium 2.1; TSH 1.97 08/12/2020: ALT 9; BUN 16; Creat 0.85; Hemoglobin 10.6; Platelets 256; Potassium 4.4; Sodium 139     Lipid Panel    Component Value Date/Time   CHOL 178 08/12/2020 1504   TRIG 92 08/12/2020 1504   HDL 71 08/12/2020 1504   CHOLHDL 2.5 08/12/2020 1504   VLDL 13 03/08/2017 1518   LDLCALC 88 08/12/2020 1504     Wt Readings from Last 3 Encounters:  09/08/20 64.9 kg  08/12/20 64.9 kg  06/25/20 63.6 kg    Other studies Reviewed: Additional studies/ records that were reviewed today include:   None   ASSESSMENT AND PLAN:  1. Palpitations/PACs: -Benign  -Continue BB  2. HLD: -LDL, 83 on 08/12/20  -  On statin 3x weekly  -AST/ALT WNL   3. HTN: - stable continue DASH type diet  -Continue current regimen with metoprolol 25mg  PO BID  4. Smoking:  Has not smoked since foot surgery in October f/u lung cancer CT September 2022    Current medicines are reviewed at length with the patient today.  The patient does not have concerns regarding medicines.  The following changes have been made:  no change  Labs/ tests ordered today include: None  No orders of the defined types were placed in this encounter.   Disposition:   FU with me  in 1 year  Signed, Jenkins Rouge, MD  09/08/2020 2:22 PM    [facility] [address], [place], [place]  [address] Phone: [phone]; Fax: [phone]

## 2020-09-03 ENCOUNTER — Other Ambulatory Visit: Payer: Self-pay | Admitting: Internal Medicine

## 2020-09-03 MED ORDER — ATORVASTATIN CALCIUM 40 MG PO TABS: ORAL_TABLET | ORAL | 0 refills | Status: DC

## 2020-09-07 DIAGNOSIS — R350 Frequency of micturition: Secondary | ICD-10-CM | POA: Diagnosis not present

## 2020-09-07 DIAGNOSIS — N3946 Mixed incontinence: Secondary | ICD-10-CM | POA: Diagnosis not present

## 2020-09-07 DIAGNOSIS — R3915 Urgency of urination: Secondary | ICD-10-CM | POA: Diagnosis not present

## 2020-09-08 ENCOUNTER — Other Ambulatory Visit: Payer: Self-pay

## 2020-09-08 ENCOUNTER — Ambulatory Visit: Payer: PPO | Admitting: Cardiovascular Disease

## 2020-09-08 ENCOUNTER — Encounter: Payer: Self-pay | Admitting: Cardiovascular Disease

## 2020-09-08 VITALS — BP 100/56 | HR 65 | Ht 64.0 in | Wt 143.0 lb

## 2020-09-08 DIAGNOSIS — I10 Essential (primary) hypertension: Secondary | ICD-10-CM

## 2020-09-08 DIAGNOSIS — R002 Palpitations: Secondary | ICD-10-CM

## 2020-09-08 NOTE — Patient Instructions (Signed)

## 2020-09-09 DIAGNOSIS — Z4889 Encounter for other specified surgical aftercare: Secondary | ICD-10-CM | POA: Diagnosis not present

## 2020-09-09 DIAGNOSIS — M76822 Posterior tibial tendinitis, left leg: Secondary | ICD-10-CM | POA: Diagnosis not present

## 2020-09-09 DIAGNOSIS — M19072 Primary osteoarthritis, left ankle and foot: Secondary | ICD-10-CM | POA: Diagnosis not present

## 2020-09-09 DIAGNOSIS — M79672 Pain in left foot: Secondary | ICD-10-CM | POA: Diagnosis not present

## 2020-09-17 DIAGNOSIS — M25572 Pain in left ankle and joints of left foot: Secondary | ICD-10-CM | POA: Diagnosis not present

## 2020-09-21 DIAGNOSIS — M25572 Pain in left ankle and joints of left foot: Secondary | ICD-10-CM | POA: Diagnosis not present

## 2020-09-24 DIAGNOSIS — M25572 Pain in left ankle and joints of left foot: Secondary | ICD-10-CM | POA: Diagnosis not present

## 2020-09-28 DIAGNOSIS — M25572 Pain in left ankle and joints of left foot: Secondary | ICD-10-CM | POA: Diagnosis not present

## 2020-10-01 DIAGNOSIS — M25572 Pain in left ankle and joints of left foot: Secondary | ICD-10-CM | POA: Diagnosis not present

## 2020-10-07 DIAGNOSIS — M19072 Primary osteoarthritis, left ankle and foot: Secondary | ICD-10-CM | POA: Diagnosis not present

## 2020-10-14 ENCOUNTER — Other Ambulatory Visit: Payer: Self-pay | Admitting: Internal Medicine

## 2020-10-14 ENCOUNTER — Other Ambulatory Visit: Payer: Self-pay | Admitting: Cardiovascular Disease

## 2020-10-14 MED ORDER — SERTRALINE HCL 100 MG PO TABS: ORAL_TABLET | ORAL | 0 refills | Status: DC

## 2020-11-04 DIAGNOSIS — M25562 Pain in left knee: Secondary | ICD-10-CM | POA: Diagnosis not present

## 2020-11-17 DIAGNOSIS — L4059 Other psoriatic arthropathy: Secondary | ICD-10-CM | POA: Diagnosis not present

## 2020-11-18 ENCOUNTER — Ambulatory Visit: Payer: PPO | Admitting: Internal Medicine

## 2020-11-27 DIAGNOSIS — L409 Psoriasis, unspecified: Secondary | ICD-10-CM | POA: Diagnosis not present

## 2020-12-01 ENCOUNTER — Encounter: Payer: Self-pay | Admitting: Internal Medicine

## 2020-12-01 NOTE — Patient Instructions (Signed)

## 2020-12-01 NOTE — Progress Notes (Signed)
Future Appointments  Date Time Provider [place]  12/02/2020  2:30 PM Unk Pinto, MD [facility] None  05/13/2021 11:15 AM Liane Comber, NP [facility] None  08/12/2021  2:00 PM [name], NP [facility] None   History of Present Illness:       This very nice 75 y.o. MWF presents for 3 month follow up with HTN, HLD, Pre-Diabetes and Vitamin D Deficiency. Patient is on weekly MTX for Psoriatic Arthritis.       Patient is treated for HTN  (1998) & BP has been controlled at home. Today's BP is at goal - 116/68. Patient has had no complaints of any cardiac type chest pain, palpitations, dyspnea / orthopnea / PND, dizziness, claudication, or dependent edema.       Hyperlipidemia is controlled with diet & Atorvastatin. Patient denies myalgias or other med SE's. Last Lipids were at goal:  Lab Results  Component Value Date   CHOL 178 08/12/2020   HDL 71 08/12/2020   LDLCALC 88 08/12/2020   TRIG 92 08/12/2020   CHOLHDL 2.5 08/12/2020     Also, the patient has history of PreDiabetes (A1c 6.0% /2015) and has had no symptoms of reactive hypoglycemia, diabetic polys, paresthesias or visual blurring.  Last A1c was at goal:  Lab Results  Component Value Date   HGBA1C 5.3 01/02/2020        Further, the patient also has history of Vitamin D Deficiency and supplements vitamin D without any suspected side-effects. Last vitamin D was at goal:  Lab Results  Component Value Date   VD25OH 81 01/02/2020    Current Outpatient Medications on File Prior to Visit  Medication Sig  . atorvastatin  40 MG tablet Take 1 tablet 3 x /week for Cholesterol  . B Complex Vitamins Take 1 tablet every other day.   Marland Kitchen VITAMIN D 5000  u Take 5,000 Units 2 times daily.  . folic acid  1 MG tablet Take 1 mg  daily.  . Magnesium 250 MG TABS Take 250 mg  daily.   . methotrexate  2.5 MG tablet Per patient takes 8 tablets once a week--reported on 01/02/2020  . metoprolol tartrate   25 MG tablet TAKE ONE TABLET  DAILY  . mirabegron 50 MG TB24 tablet Take  daily.  . sertraline (ZOLOFT) 100 MG tablet Take  1 tablet  Daily  for Mood  . aspirin EC 81 MG tablet Patient not taking: Reported on 12/02/2020     Allergies  Allergen Reactions  . Bactrim [Sulfamethoxazole-Trimethoprim] Swelling    Face and eyes swollen and red.  Marland Kitchen Ketek [Telithromycin]     Unknown    PMHx:   Past Medical History:  Diagnosis Date  . Acquired short Achilles tendon, left   . Anemia   . Anxiety   . Arthritis    hands and spine  . Blood transfusion without reported diagnosis   . Cervical dysplasia 1976  . Complication of anesthesia   . COPD (chronic obstructive pulmonary disease) ([place])   . Depression   . Diverticulitis of colon (without mention of hemorrhage)(562.11)   . Diverticulosis of colon (without mention of hemorrhage)   . Elevated cholesterol   . GERD (gastroesophageal reflux disease)    no meds now  . Heart murmur   . Hypertension    pt states she takes med for tachycardia NOT HTN -metoprolol taken for tachycardia  . Internal hemorrhoids without mention of complication   . Mitral valve  disorders(424.0)   . MRSA infection   . Osteoporosis    spine  . Palpitations   . Pelvic adhesive disease   . Plaque psoriasis   . PONV (postoperative nausea and vomiting)   . Prediabetes   . Tachycardia    never had HTN  . Vitamin D deficiency     Immunization History  Administered Date(s) Administered  . DT (Pediatric) 04/27/2015  . Influenza, High Dose Seasonal PF 07/10/2019, 08/12/2020  . PFIZER(Purple Top)SARS-COV-2 Vaccination 10/11/2019, 11/03/2019, 06/05/2020  . Pneumococcal Conjugate-13 04/27/2015  . Pneumococcal Polysaccharide-23 12/22/2004, 11/24/2016  . Rabies, IM 05/04/2016, 05/07/2016  . Td 12/16/2003    Past Surgical History:  Procedure Laterality Date  . ABDOMINAL HYSTERECTOMY  2000   TAH,BSO endometriosis, leiomyomata  . CALCANEAL OSTEOTOMY Left 06/25/2020    Procedure: Medializing calcaneal osteotomy;  Surgeon: Wylene Simmer, MD;  Location: ;  Service: Orthopedics;  Laterality: Left;  . COLONOSCOPY    . [place]  . ESOPHAGOGASTRODUODENOSCOPY    . FINGER SURGERY    . FOOT ARTHRODESIS Left 06/25/2020   Procedure: Talonavicular Arthrodesis;  Surgeon: Wylene Simmer, MD;  Location: [facility];  Service: Orthopedics;  Laterality: Left;  . GANGLION CYST EXCISION Right    hand   . GASTROCNEMIUS RECESSION Left 06/25/2020   Procedure: Left gastroc recession;  Surgeon: Wylene Simmer, MD;  Location: [facility];  Service: Orthopedics;  Laterality: Left;  . GYNECOLOGIC CRYOSURGERY  1976  . HEMORRHOID SURGERY    . OOPHORECTOMY    . ROTATOR CUFF REPAIR Left   . ROTATOR CUFF REPAIR Right 2/15  . TENOLYSIS Left 06/25/2020   Procedure: Left Posterior Tibial Tenolysis;  Surgeon: Wylene Simmer, MD;  Location: [facility];  Service: Orthopedics;  Laterality: Left;  . TYMPANOPLASTY Right   . UPPER GASTROINTESTINAL ENDOSCOPY      FHx:    Reviewed / unchanged  SHx:    Reviewed / unchanged   Systems Review:  Constitutional: Denies fever, chills, wt changes, headaches, insomnia, fatigue, night sweats, change in appetite. Eyes: Denies redness, blurred vision, diplopia, discharge, itchy, watery eyes.  ENT: Denies discharge, congestion, post nasal drip, epistaxis, sore throat, earache, hearing loss, dental pain, tinnitus, vertigo, sinus pain, snoring.  CV: Denies chest pain, palpitations, irregular heartbeat, syncope, dyspnea, diaphoresis, orthopnea, PND, claudication or edema. Respiratory: denies cough, dyspnea, DOE, pleurisy, hoarseness, laryngitis, wheezing.  Gastrointestinal: Denies dysphagia, odynophagia, heartburn, reflux, water brash, abdominal pain or cramps, nausea, vomiting, bloating, diarrhea, constipation, hematemesis, melena, hematochezia  or  hemorrhoids. Genitourinary: Denies dysuria, frequency, urgency, nocturia, hesitancy, discharge, hematuria or flank pain. Musculoskeletal: Denies arthralgias, myalgias, stiffness, jt. swelling, pain, limping or strain/sprain.  Skin: Denies pruritus, rash, hives, warts, acne, eczema or change in skin lesion(s). Neuro: No weakness, tremor, incoordination, spasms, paresthesia or pain. Psychiatric: Denies confusion, memory loss or sensory loss. Endo: Denies change in weight, skin or hair change.  Heme/Lymph: No excessive bleeding, bruising or enlarged lymph nodes.  Physical Exam  BP 116/68   Pulse 92   Temp (!) 97.3 F (36.3 C)   Resp 16   Ht 5\' 4"  (1.626 m)   Wt 140 lb 9.6 oz (63.8 kg)   SpO2 95%   BMI 24.13 kg/m   Appears  well nourished, well groomed  and in no distress.  Eyes: PERRLA, EOMs, conjunctiva no swelling or erythema. Sinuses: No frontal/maxillary tenderness ENT/Mouth: EAC's clear, TM's nl w/o erythema, bulging. Nares clear w/o erythema, swelling,  exudates. Oropharynx clear without erythema or exudates. Oral hygiene is good. Tongue normal, non obstructing. Hearing intact.  Neck: Supple. Thyroid not palpable. Car 2+/2+ without bruits, nodes or JVD. Chest: Respirations nl with BS clear & equal w/o rales, rhonchi, wheezing or stridor.  Cor: Heart sounds normal w/ regular rate and rhythm without sig. murmurs, gallops, clicks or rubs. Peripheral pulses normal and equal  without edema.  Abdomen: Soft & bowel sounds normal. Non-tender w/o guarding, rebound, hernias, masses or organomegaly.  Lymphatics: Unremarkable.  Musculoskeletal: Full ROM all peripheral extremities, joint stability, 5/5 strength and normal gait.  Skin: Warm, dry without exposed rashes, lesions or ecchymosis apparent.  Neuro: Cranial nerves intact, reflexes equal bilaterally. Sensory-motor testing grossly intact. Tendon reflexes grossly intact.  Pysch: Alert & oriented x 3.  Insight and judgement nl &  appropriate. No ideations.  Assessment and Plan:  1. Essential hypertension  - Continue medication, monitor blood pressure at home.  - Continue DASH diet.  Reminder to go to the ER if any CP,  SOB, nausea, dizziness, severe HA, changes vision/speech.  - CBC with Differential/Platelet - COMPLETE METABOLIC PANEL WITH GFR - Magnesium - TSH  2. Hyperlipidemia, mixed  - Continue diet/meds, exercise,& lifestyle modifications.  - Continue monitor periodic cholesterol/liver & renal functions   - Lipid panel - TSH  3. Abnormal glucose  - Continue diet, exercise  - Lifestyle modifications.  - Monitor appropriate labs  - Hemoglobin A1c - Insulin, random  4. Vitamin D deficiency  - Continue supplementation.  - VITAMIN D 25 Hydroxy  5. Aortic atherosclerosis ([place]) by CT Scan in 04/2020   6. Medication management  - CBC with Differential/Platelet - COMPLETE METABOLIC PANEL WITH GFR - Magnesium - Lipid panel - TSH - Hemoglobin A1c - Insulin, random - VITAMIN D 25 Hydroxy          Discussed  regular exercise, BP monitoring, weight control to achieve/maintain BMI less than 25 and discussed med and SE's. Recommended labs to assess and monitor clinical status with further disposition pending results of labs.  I discussed the assessment and treatment plan with the patient. The patient was provided an opportunity to ask questions and all were answered. The patient agreed with the plan and demonstrated an understanding of the instructions.  I provided over 30 minutes of exam, counseling, chart review and  complex critical decision making.          The patient was advised to call back or seek an in-person evaluation if the symptoms worsen or if the condition fails to improve as anticipated.   Kirtland Bouchard, MD

## 2020-12-02 ENCOUNTER — Other Ambulatory Visit: Payer: Self-pay

## 2020-12-02 ENCOUNTER — Ambulatory Visit (INDEPENDENT_AMBULATORY_CARE_PROVIDER_SITE_OTHER): Payer: PPO | Admitting: Internal Medicine

## 2020-12-02 VITALS — BP 116/68 | HR 92 | Temp 97.3°F | Resp 16 | Ht 64.0 in | Wt 140.6 lb

## 2020-12-02 DIAGNOSIS — R7309 Other abnormal glucose: Secondary | ICD-10-CM | POA: Diagnosis not present

## 2020-12-02 DIAGNOSIS — I700 Atherosclerosis of aorta: Secondary | ICD-10-CM

## 2020-12-02 DIAGNOSIS — I10 Essential (primary) hypertension: Secondary | ICD-10-CM

## 2020-12-02 DIAGNOSIS — Z79899 Other long term (current) drug therapy: Secondary | ICD-10-CM | POA: Diagnosis not present

## 2020-12-02 DIAGNOSIS — E559 Vitamin D deficiency, unspecified: Secondary | ICD-10-CM | POA: Diagnosis not present

## 2020-12-02 DIAGNOSIS — E782 Mixed hyperlipidemia: Secondary | ICD-10-CM | POA: Diagnosis not present

## 2020-12-03 LAB — CBC WITH DIFFERENTIAL/PLATELET
Absolute Monocytes: 421 cells/uL (ref 200–950)
Basophils Absolute: 21 cells/uL (ref 0–200)
Basophils Relative: 0.3 %
Eosinophils Absolute: 69 cells/uL (ref 15–500)
Eosinophils Relative: 1 %
HCT: 33.2 % — ABNORMAL LOW (ref 35.0–45.0)
Hemoglobin: 10.9 g/dL — ABNORMAL LOW (ref 11.7–15.5)
Lymphs Abs: 1697 cells/uL (ref 850–3900)
MCH: 31.6 pg (ref 27.0–33.0)
MCHC: 32.8 g/dL (ref 32.0–36.0)
MCV: 96.2 fL (ref 80.0–100.0)
MPV: 9.3 fL (ref 7.5–12.5)
Monocytes Relative: 6.1 %
Neutro Abs: 4692 cells/uL (ref 1500–7800)
Neutrophils Relative %: 68 %
Platelets: 259 10*3/uL (ref 140–400)
RBC: 3.45 10*6/uL — ABNORMAL LOW (ref 3.80–5.10)
RDW: 13.8 % (ref 11.0–15.0)
Total Lymphocyte: 24.6 %
WBC: 6.9 10*3/uL (ref 3.8–10.8)

## 2020-12-03 LAB — TSH: TSH: 1.83 mIU/L (ref 0.40–4.50)

## 2020-12-03 LAB — COMPLETE METABOLIC PANEL WITH GFR
AG Ratio: 1.8 (calc) (ref 1.0–2.5)
ALT: 10 U/L (ref 6–29)
AST: 13 U/L (ref 10–35)
Albumin: 4.2 g/dL (ref 3.6–5.1)
Alkaline phosphatase (APISO): 80 U/L (ref 37–153)
BUN: 13 mg/dL (ref 7–25)
CO2: 28 mmol/L (ref 20–32)
Calcium: 9.5 mg/dL (ref 8.6–10.4)
Chloride: 104 mmol/L (ref 98–110)
Creat: 0.81 mg/dL (ref 0.60–0.93)
GFR, Est African American: 84 mL/min/{1.73_m2} (ref >=60)
GFR, Est Non African American: 72 mL/min/{1.73_m2} (ref >=60)
Globulin: 2.3 g/dL (calc) (ref 1.9–3.7)
Glucose, Bld: 84 mg/dL (ref 65–99)
Potassium: 4.5 mmol/L (ref 3.5–5.3)
Sodium: 138 mmol/L (ref 135–146)
Total Bilirubin: 0.6 mg/dL (ref 0.2–1.2)
Total Protein: 6.5 g/dL (ref 6.1–8.1)

## 2020-12-03 LAB — LIPID PANEL
Cholesterol: 185 mg/dL (ref <200)
HDL: 74 mg/dL (ref >=50)
LDL Cholesterol (Calc): 92 mg/dL (calc)
Non-HDL Cholesterol (Calc): 111 mg/dL (calc) (ref <130)
Total CHOL/HDL Ratio: 2.5 (calc) (ref <5.0)
Triglycerides: 92 mg/dL (ref <150)

## 2020-12-03 LAB — HEMOGLOBIN A1C
Hgb A1c MFr Bld: 5.5 % of total Hgb (ref <5.7)
Mean Plasma Glucose: 111 mg/dL
eAG (mmol/L): 6.2 mmol/L

## 2020-12-03 LAB — VITAMIN D 25 HYDROXY (VIT D DEFICIENCY, FRACTURES): Vit D, 25-Hydroxy: 92 ng/mL (ref 30–100)

## 2020-12-03 LAB — MAGNESIUM: Magnesium: 2 mg/dL (ref 1.5–2.5)

## 2020-12-03 LAB — INSULIN, RANDOM: Insulin: 2.8 u[IU]/mL

## 2020-12-03 NOTE — Progress Notes (Signed)
============================================================ -   Test results slightly outside the reference range are not unusual. If there is anything important, I will review this with you,  otherwise it is considered normal test values.  If you have further questions,  please do not hesitate to contact me at the office or via My Chart.  ============================================================ ============================================================  -  Chronic mild anemia - is stable ============================================================ ============================================================  -  Total Chol = 185 and LDL Chol = 92 - Both  Excellent   - Very low risk for Heart Attack  / Stroke ========================================================  - A1c - Normal - Great - No Diabetes  ============================================================ ============================================================  -  Vitamin D = 92  -  Excellent  ============================================================ ============================================================  - All Else - CBC - Kidneys - Electrolytes - Liver - Magnesium & Thyroid    - all  Normal / OK ===========================================================   - Keep up the [place] Work   ! ============================================================ ============================================================

## 2020-12-19 DIAGNOSIS — Z1152 Encounter for screening for COVID-19: Secondary | ICD-10-CM | POA: Diagnosis not present

## 2020-12-19 DIAGNOSIS — U071 COVID-19: Secondary | ICD-10-CM | POA: Diagnosis not present

## 2020-12-28 ENCOUNTER — Other Ambulatory Visit: Payer: Self-pay | Admitting: Internal Medicine

## 2021-01-05 DIAGNOSIS — H35033 Hypertensive retinopathy, bilateral: Secondary | ICD-10-CM | POA: Diagnosis not present

## 2021-01-14 ENCOUNTER — Other Ambulatory Visit: Payer: Self-pay | Admitting: Internal Medicine

## 2021-01-14 MED ORDER — SERTRALINE HCL 100 MG PO TABS: ORAL_TABLET | ORAL | 3 refills | Status: DC

## 2021-01-15 ENCOUNTER — Other Ambulatory Visit: Payer: Self-pay | Admitting: Internal Medicine

## 2021-01-15 MED ORDER — SERTRALINE HCL 100 MG PO TABS: ORAL_TABLET | ORAL | 3 refills | Status: DC

## 2021-01-18 DIAGNOSIS — R350 Frequency of micturition: Secondary | ICD-10-CM | POA: Diagnosis not present

## 2021-01-18 DIAGNOSIS — N3946 Mixed incontinence: Secondary | ICD-10-CM | POA: Diagnosis not present

## 2021-01-28 DIAGNOSIS — J3489 Other specified disorders of nose and nasal sinuses: Secondary | ICD-10-CM | POA: Diagnosis not present

## 2021-02-16 DIAGNOSIS — J029 Acute pharyngitis, unspecified: Secondary | ICD-10-CM | POA: Diagnosis not present

## 2021-02-18 DIAGNOSIS — M25572 Pain in left ankle and joints of left foot: Secondary | ICD-10-CM | POA: Diagnosis not present

## 2021-02-18 DIAGNOSIS — Z6824 Body mass index (BMI) 24.0-24.9, adult: Secondary | ICD-10-CM | POA: Diagnosis not present

## 2021-02-18 DIAGNOSIS — L4059 Other psoriatic arthropathy: Secondary | ICD-10-CM | POA: Diagnosis not present

## 2021-04-02 IMAGING — MR MR ANKLE*L* W/O CM
4 of 5 series · 31 of 40 positions shown · non-contrast
Comparison: None.

CLINICAL DATA: Left ankle pain since 04/14/2020.  No known injury.

EXAM:
MRI OF THE LEFT ANKLE WITHOUT CONTRAST
TECHNIQUE: Multiplanar, multisequence MR imaging of the ankle was performed. No
intravenous contrast was administered.

[Series 3: T2 fat-sat · axial · 3.0mm · 0.66mm/px · z∈[-103,+33]mm · 9 of 39 slices shown (1 of 2)]
[im 1/39]
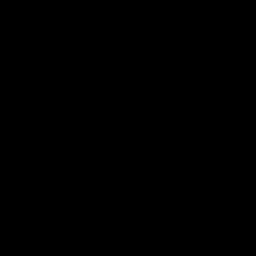
[im 5/39]
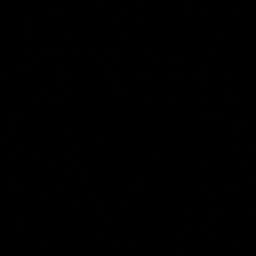
[im 10/39]
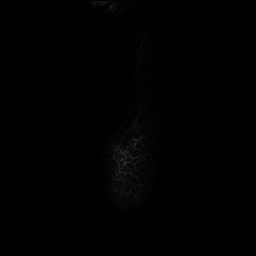
[im 15/39]
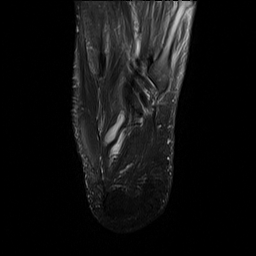
[im 20/39]
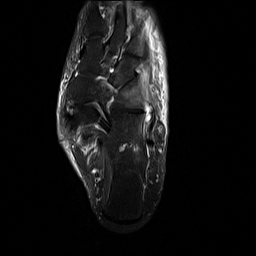
[im 24/39]
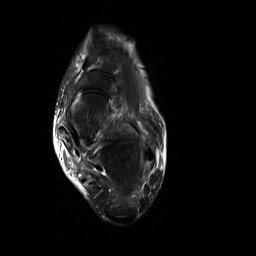
[im 29/39]
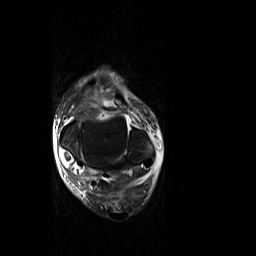
[im 34/39]
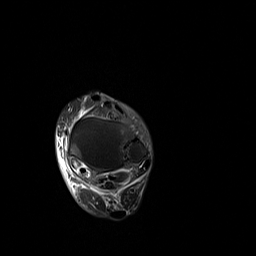
[im 39/39]
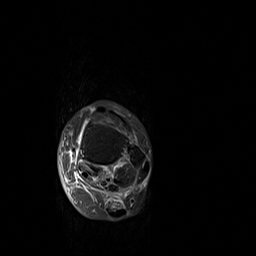

[Series 4: PD fat-sat · axial · 3.0mm · 0.66mm/px · z∈[-107,+33]mm · 9 of 40 slices shown]
[im 1/40]
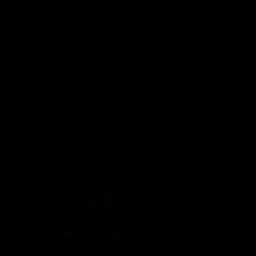
[im 5/40]
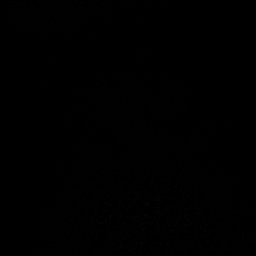
[im 10/40]
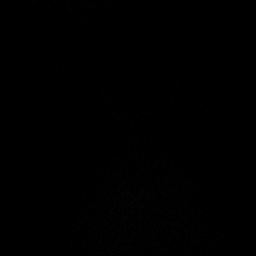
[im 15/40]
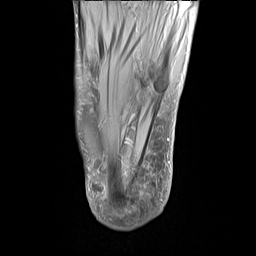
[im 20/40]
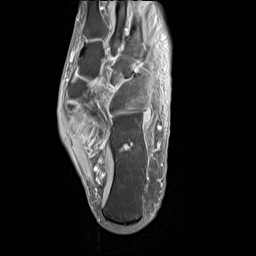
[im 25/40]
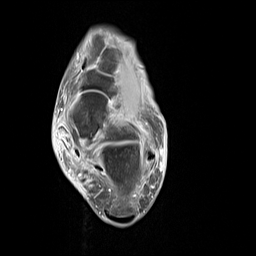
[im 30/40]
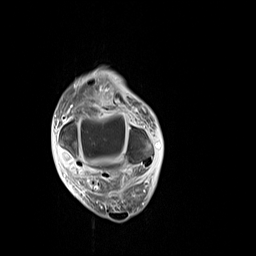
[im 35/40]
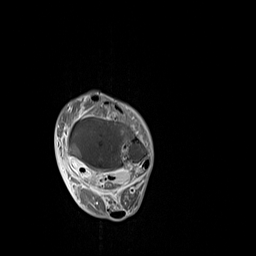
[im 40/40]
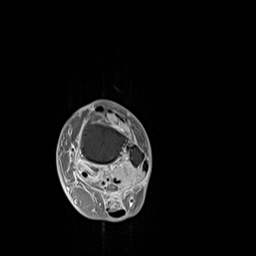

[Series 5: T1 · sagittal · 3.0mm · 0.31mm/px · 5 of 27 slices shown]
[im 1/27]
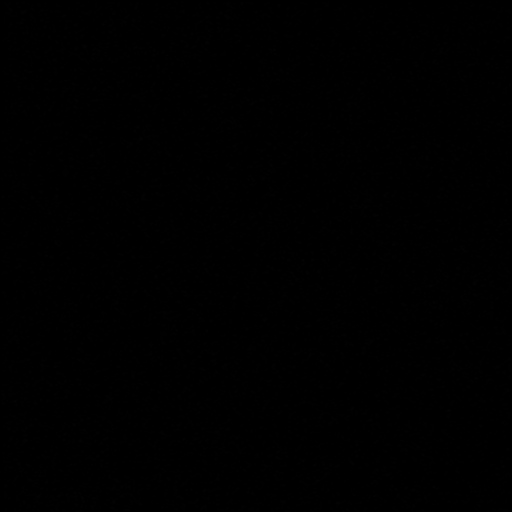
[im 6/27]
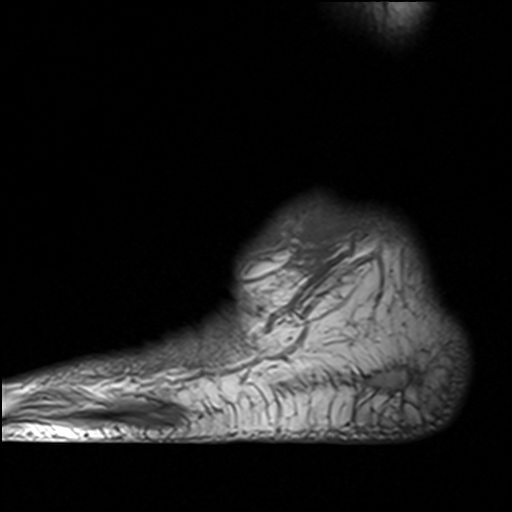
[im 11/27]
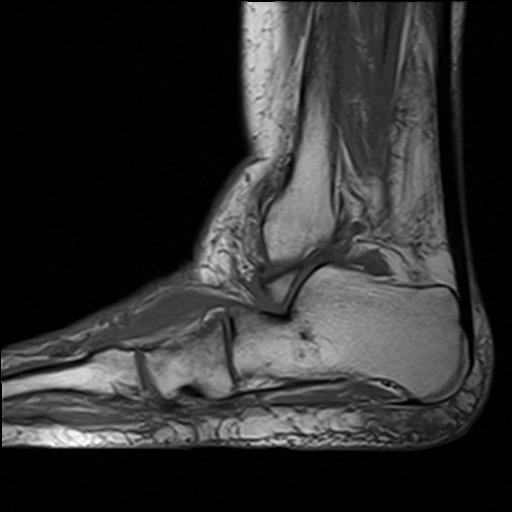
[im 16/27]
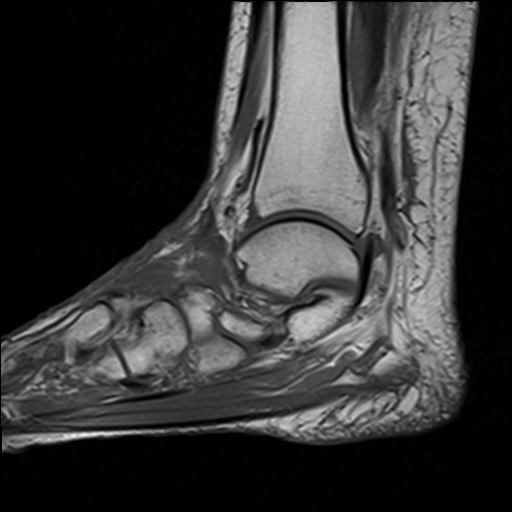
[im 27/27]
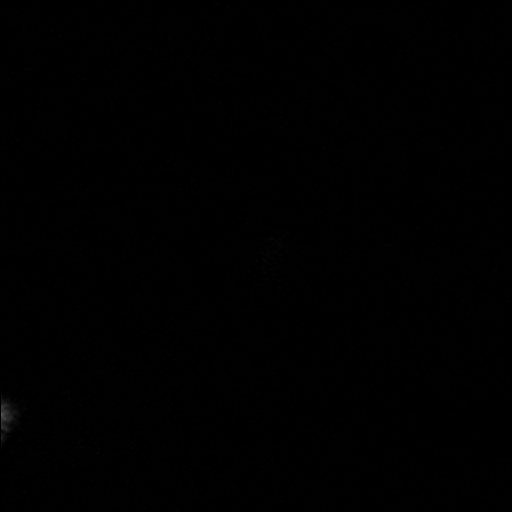

[Series 7: T2 fat-sat · coronal · 3.0mm · 0.31mm/px · 8 of 42 slices shown (2 of 2)]
[im 1/42]
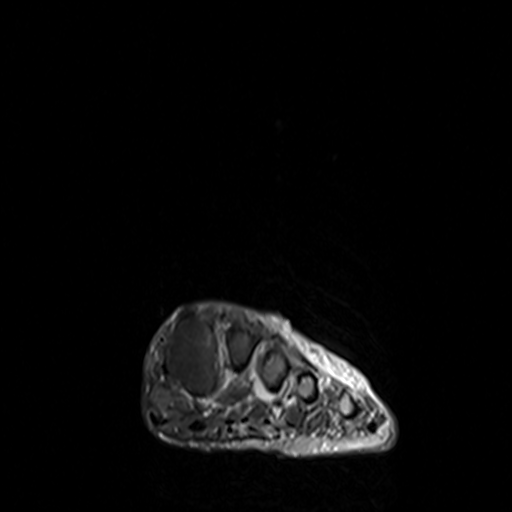
[im 5/42]
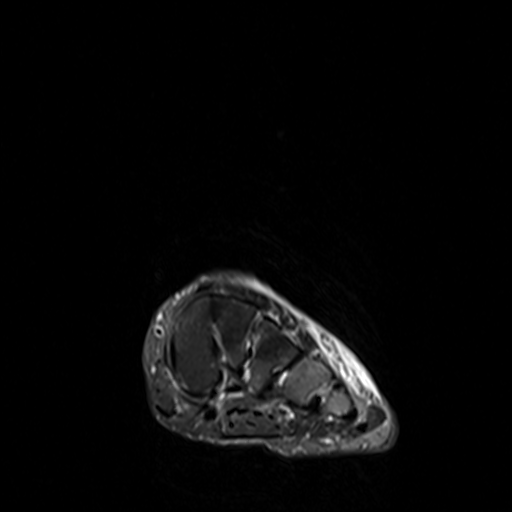
[im 14/42]
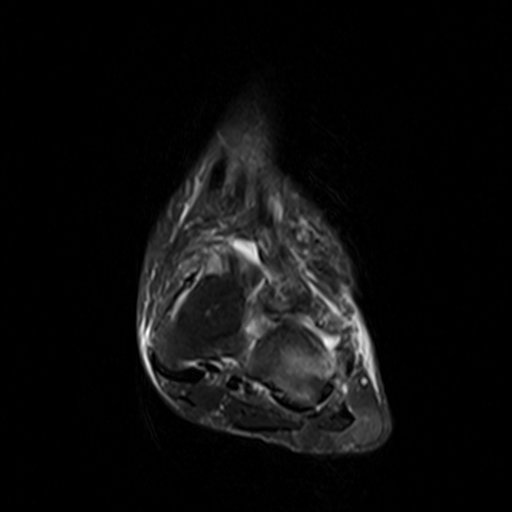
[im 19/42]
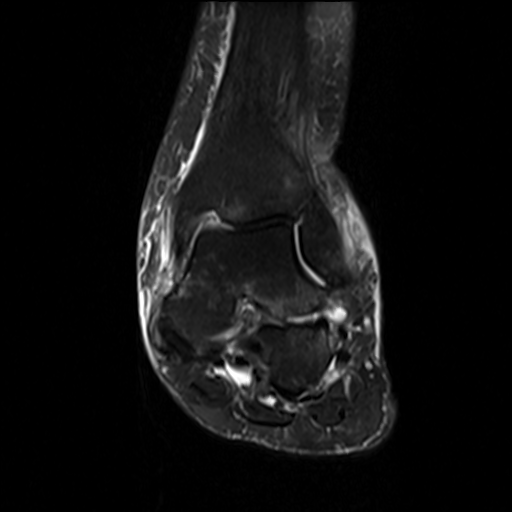
[im 23/42]
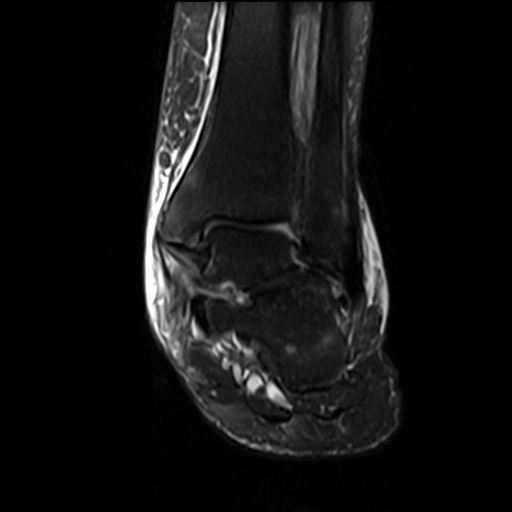
[im 28/42]
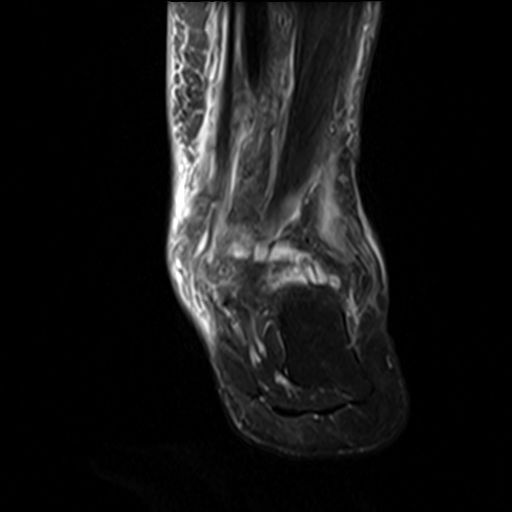
[im 37/42]
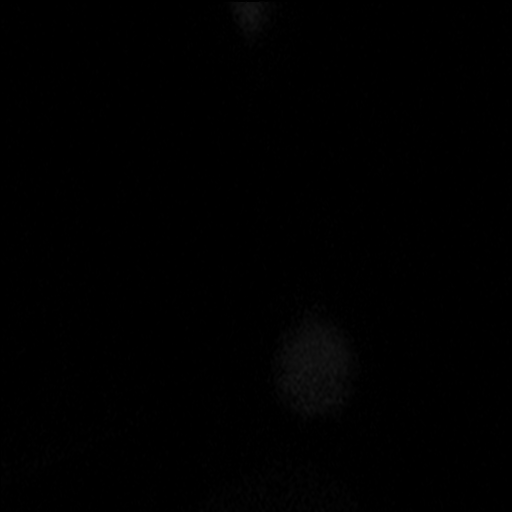
[im 42/42]
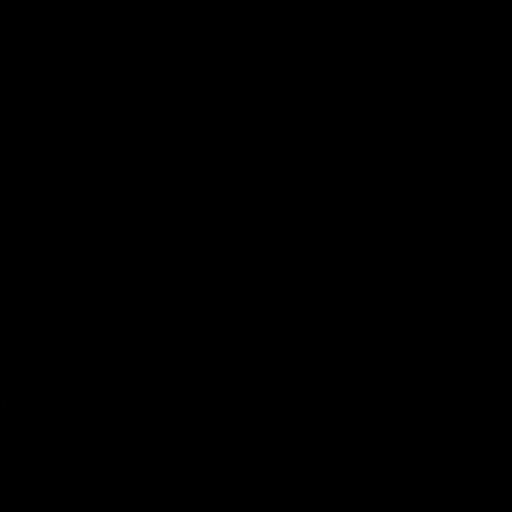

[31 of 40 positions shown; findings below may reference images not displayed]

FINDINGS: TENDONS

Peroneal: Intact.

Posteromedial: The patient has severe PTT tenosynovitis. There is
extensive tearing of the tendon as it passes posterior to the distal
tibia and along the talus. The tear is near complete at the level of
the base of the medial malleolus where only a thin strand of intact
tendon is identified.

Anterior: Intact.

Achilles: Intact.

Plantar Fascia: Normal.

LIGAMENTS

Lateral: Intact.

Medial: Intact.

CARTILAGE

Ankle Joint: Mild osteoarthritis is present. Small joint effusion.
No osteochondral lesion.

Subtalar Joints/Sinus Tarsi: Mild subtalar osteoarthritis. No
evidence of sinus tarsi syndrome.

Bones: The patient has advanced degenerative disease at the fourth
and fifth tarsometatarsal joints. Marrow edema throughout the cuboid
and visualized fourth and fifth metatarsals is consistent with
stress change.

Other: There is subcutaneous edema about the ankle.
IMPRESSION: Severe PTT tenosynovitis and tearing. The tendon is almost
completely torn at the level of the base of the medial malleolus as
seen on image 8 of series 3.

Advanced fourth and fifth tarsometatarsal osteoarthritis. Marrow
edema throughout the cuboid and visualized fourth and fifth
metatarsals is consistent with secondary stress change. No fracture.

## 2021-04-05 DIAGNOSIS — M19072 Primary osteoarthritis, left ankle and foot: Secondary | ICD-10-CM | POA: Diagnosis not present

## 2021-05-12 DIAGNOSIS — Z85828 Personal history of other malignant neoplasm of skin: Secondary | ICD-10-CM | POA: Diagnosis not present

## 2021-05-12 DIAGNOSIS — L219 Seborrheic dermatitis, unspecified: Secondary | ICD-10-CM | POA: Diagnosis not present

## 2021-05-12 DIAGNOSIS — L814 Other melanin hyperpigmentation: Secondary | ICD-10-CM | POA: Diagnosis not present

## 2021-05-12 DIAGNOSIS — L821 Other seborrheic keratosis: Secondary | ICD-10-CM | POA: Diagnosis not present

## 2021-05-12 DIAGNOSIS — L570 Actinic keratosis: Secondary | ICD-10-CM | POA: Diagnosis not present

## 2021-05-12 DIAGNOSIS — Z411 Encounter for cosmetic surgery: Secondary | ICD-10-CM | POA: Diagnosis not present

## 2021-05-12 DIAGNOSIS — L578 Other skin changes due to chronic exposure to nonionizing radiation: Secondary | ICD-10-CM | POA: Diagnosis not present

## 2021-05-12 DIAGNOSIS — D225 Melanocytic nevi of trunk: Secondary | ICD-10-CM | POA: Diagnosis not present

## 2021-05-12 NOTE — Progress Notes (Signed)
Medicare Wellness Visit and 3 month OV Assessment:   Encounter for annual medicare wellness visit Schedule mammogram, phone number given Due annually  Atherosclerosis of aorta Control blood pressure, cholesterol, glucose, increase exercise.   Essential hypertension - continue medications, DASH diet, exercise and monitor at home. Call if greater than 130/80.  - CBC with Differential/Platelet - CMP/GFR  Emphysema Pt has stopped smoking, uses inhaler PRN with flares, rare  Hx of prediabetes Discussed general issues about diabetes pathophysiology and management., Educational material distributed., Suggested low cholesterol diet., Encouraged aerobic exercise., Discussed foot care., Reminded to get yearly retinal exam. Check A1C   Mixed hyperlipidemia Continue statin -continue medications, check lipids, decrease fatty foods, increase activity.  Check lipid, TSH   Vitamin D deficiency Continue supplement  Psoriatic arthritis/Cervical arthritis ([place]) Dr. Amil Amen following, now on methotrexate    Diverticulosus of colon  Bowel management; add fiber if not taking Diarrhea occurring for 1-3  days after taking Methotrexate  Mitral valve disorder No SOB, CP, edema, monitor  Depression  stress management techniques discussed, increase water, good sleep hygiene discussed, increase exercise, and increase veggies.  - will continue zoloft for now, successfully tapered off of benzo  History of tobacco use Getting annual CT lung cancer screening, 04/2020, nodule, follow up ordered Quit smoking 05/2020  Psoriasis Follows with derm; improved with methotrexate  Pt does animal rescue Rabies titer per pt request  Future Appointments  Date Time Provider [place]  08/12/2021  2:00 PM Magda Bernheim, NP [facility] None  05/13/2022 11:30 AM Magda Bernheim, NP [facility] None    Plan:   During the course of the visit the patient was educated and counseled about appropriate  screening and preventive services including:   Influenza vaccine Td vaccine Screening electrocardiogram Screening mammography Bone densitometry screening Colorectal cancer screening Diabetes screening Glaucoma screening Nutrition counseling    Subjective:   [name] [name] [place] is a 75 y.o. female who presents for Medicare Annual Wellness Visit and 3 month follow up for HTN, chol, preDM, and depression.   She is on zoloft for depression; successfully tapered off of ativan last year, reports sleeping well. Reports her mood as good, occasional days she feels down.   Pt quit smoking 05/2020.  Has emphysema by imaging, asymptomatic, no perceived benefit with inhalers Hx of 30+ pack year history, had CT lung 04/2020 with small nodule recommended for annual screening.   Psoriatic arthritis/cervical arthritis, followed by Dr. Amil Amen, now on low dose methotrexate 0.25 mg 8 tabs per week, psoriasis is resolved  On methotrexate and has nausea and diarrhea up to 3 days after taking.   BMI is Body mass index is 25.58 kg/m., she has not been working on diet and exercise; she could walk 20 min walk if the weather was less hot near her home and plans to start daily in another few weeks Wt Readings from Last 3 Encounters:  05/13/21 149 lb (67.6 kg)  12/02/20 140 lb 9.6 oz (63.8 kg)  09/08/20 143 lb (64.9 kg)   She has aortic atherosclerosis per CT 08/2018 Follows with Dr. 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  She denies chest pain, shortness of breath, dizziness.  Pt has noticed some floaters in right eye- followed by Dr. Marica Otter  She is on cholesterol medication (atorvastatin 40 mg three times/ week) and  denies myalgias. Her cholesterol is at goal. The cholesterol last visit was:   Lab Results  Component Value Date   CHOL 185 12/02/2020   HDL 74 12/02/2020   LDLCALC 92 12/02/2020   TRIG 92 12/02/2020   CHOLHDL 2.5 12/02/2020   She has been working on diet and exercise for hx of prediabetes recently well controlled, and denies paresthesia of the feet, polydipsia, polyuria and visual disturbances. Last A1C in the office was:  Lab Results  Component Value Date   HGBA1C 5.5 12/02/2020    Lab Results  Component Value Date   GFRNONAA 72 12/02/2020   Patient is on Vitamin D supplement. Lab Results  Component Value Date   VD25OH 92 12/02/2020   Review of Systems  Constitutional:  Negative for chills, fever and weight loss.  HENT:  Negative for congestion and hearing loss.   Eyes:  Negative for blurred vision and double vision.       Floaters in eye followed by Dr. Marica Otter  Respiratory:  Negative for cough and shortness of breath.   Cardiovascular:  Negative for chest pain, palpitations, orthopnea and leg swelling.  Gastrointestinal:  Positive for diarrhea (assoc with Methotrexate) and nausea (assoc with Methotrexate). Negative for abdominal pain, constipation, heartburn and vomiting.  Musculoskeletal:  Negative for falls, joint pain and myalgias.  Skin:  Negative for rash.  Neurological:  Negative for dizziness, tingling, tremors, loss of consciousness and headaches.  Psychiatric/Behavioral:  Negative for depression, memory loss and suicidal ideas.     Medication Review Current Outpatient Medications on File Prior to Visit  Medication Sig Dispense Refill   atorvastatin (LIPITOR) 40 MG tablet TAKE ONE TABLET BY MOUTH DAILY 3 TIMES PER WEEK FOR CHOLESTEROL 39 tablet 3   B Complex Vitamins (B COMPLEX PO) Take 1 tablet by mouth every other day.      Cholecalciferol (VITAMIN D3) 5000 UNITS TABS Take 5,000 Units by mouth 2 (two) times daily.     folic acid (FOLVITE) 1 MG tablet Take  1 mg by mouth daily.     Magnesium 250 MG TABS Take 250 mg by mouth daily.      methotrexate (RHEUMATREX) 2.5 MG tablet Take 2.5 mg by mouth once a week. Per patient takes 8 tablets once a week------reported on 01/02/2020     metoprolol tartrate (LOPRESSOR) 25 MG tablet TAKE ONE TABLET BY MOUTH DAILY 90 tablet 1   mirabegron ER (MYRBETRIQ) 50 MG TB24 tablet Take 50 mg by mouth daily.     sertraline (ZOLOFT) 100 MG tablet Take  1 tablet  Daily  for Mood 90 tablet 3   aspirin EC 81 MG tablet Take 1 tablet (81 mg total) by mouth 2 (two) times daily. 84 tablet 0   No current facility-administered medications on file prior to visit.    Current Problems (verified) Patient Active Problem List   Diagnosis Date Noted   Anemia 05/06/2020   Current smoker on some days 04/25/2019   Depression, major, recurrent, in partial remission ([place]) 07/31/2018   Pulmonary emphysema ([place]) 06/13/2017   Aortic atherosclerosis ([place]) by CT Scan in 04/2020 06/13/2017   Psoriatic arthritis ([place]) 05/25/2016   Hereditary and idiopathic peripheral neuropathy 05/25/2016   Encounter for Medicare annual wellness exam 04/27/2015   Cervical arthritis ([place]) 10/10/2014   Medication management 10/02/2014   Mixed  hyperlipidemia 07/01/2014   Hypertension    Other abnormal glucose    Vitamin D deficiency    Mitral valve disorder 06/16/2009   Diverticulosis of colon 05/06/2008    Screening Tests Immunization History  Administered Date(s) Administered   DT (Pediatric) 04/27/2015   Influenza, High Dose Seasonal PF 07/10/2019, 08/12/2020   PFIZER(Purple Top)SARS-COV-2 Vaccination 10/11/2019, 11/03/2019, 06/05/2020   Pneumococcal Conjugate-13 04/27/2015   Pneumococcal Polysaccharide-23 12/22/2004, 11/24/2016   [place], IM 05/04/2016, 05/07/2016   Td 12/16/2003   Preventative care: Last colonoscopy: 12/2016, due 2023 EGD 02/2015 Last mammogram: 05/07/20 negative repeat 1 year , wants every 2 years Last pap smear/pelvic exam:  11/2012 , 1 abnormal in her 20's, declines another DEXA: at OB/GYN-08/2015 normal  Echo 2009 normal EF Ct AB 11/2014 Ct chest: 08/2018, small R nodule, follow up ordered  CXR: 2018 MRI abd: 08/2018 - benign renal cyst   Prior vaccinations: TD or Tdap: 2016 Influenza: 07/2020 Pneumococcal: 2018 Prevnar 13: 2016 Shingles/Zostavax: declines Covid 19: 2/2, 2021, pfizer 1 booster 06/05/20  Names of Other Physician/Practitioners you currently use: 1. [facility]- here for primary care 2. Dr. Sabra Heck, eye doctor, last visit 2022, mild cataracts monitoring  3. Dr. Rona Ravens, dentist, last visit 2022, [id number]. Dr. HRenda Rolls derm, last visit 2022, goes annually, had appt yesterday 05/13/21  Patient Care Team: MUnk Pinto MD as PCP - General (Internal Medicine) NJosue Hector MD as PCP - Cardiology (Cardiology) SJustice Britain MD as Consulting Physician (Orthopedic Surgery) OIran Planas MD as Consulting Physician (Orthopedic Surgery) CThornell Sartorius MD as Consulting Physician (Otolaryngology) NJosue Hector MD as Consulting Physician (Cardiology) GRana Snare MD (Inactive) as Consulting Physician (Urology) Fontaine, TBelinda Block MD (Inactive) as Consulting Physician (Gynecology) HRenda Rolls CJennefer Bravo MD as Referring Physician (Dermatology) MMarica Otter OD (Optometry) RMerrilee Jansky, DDS   Allergies Allergies  Allergen Reactions   Bactrim [Sulfamethoxazole-Trimethoprim] Swelling    Face and eyes swollen and red.   Ketek [Telithromycin]     Unknown    SURGICAL HISTORY She  has a past surgical history that includes Tympanoplasty (Right); Rotator cuff repair (Left); Ectopic pregnancy surgery (1971, 1979); Hemorrhoid surgery; Oophorectomy; Gynecologic cryosurgery (1976); Abdominal hysterectomy (2000); Rotator cuff repair (Right, 2/15); Colonoscopy; Esophagogastroduodenoscopy; Upper gastrointestinal endoscopy; Ganglion cyst excision (Right);  Finger surgery; Gastrocnemius Recession (Left, 06/25/2020); Calcaneal osteotomy (Left, 06/25/2020); Tenolysis (Left, 06/25/2020); and Foot arthrodesis (Left, 06/25/2020). FAMILY HISTORY Her family history includes Cirrhosis in her father; Colon cancer in her maternal grandmother; Colon polyps in her sister; Diabetes in her father and mother; Heart disease in her father, maternal grandfather, paternal grandfather, and paternal grandmother; Hyperlipidemia in her father; Hypertension in her father and mother; Lymphoma in her maternal grandmother; Melanoma in her mother; Stomach cancer in her maternal grandmother; Uterine cancer in her sister. SOCIAL HISTORY She  reports that she quit smoking about 11 months ago. Her smoking use included cigarettes. She started smoking about 55 years ago. She has a 32.40 pack-year smoking history. She has never used smokeless tobacco. She reports current alcohol use. She reports that she does not use drugs.  MEDICARE WELLNESS OBJECTIVES: Physical activity: Current Exercise Habits: The patient does not participate in regular exercise at present, Exercise limited by: None identified Cardiac risk factors: Cardiac Risk Factors include: advanced age (>549m, >6>10omen);dyslipidemia;hypertension;sedentary lifestyle;smoking/ tobacco exposure Depression/mood screen:   [facility] 05/13/[facility]21  Decreased Interest 0  Down, Depressed, Hopeless 0  PHQ - 2 Score 0  Altered sleeping -  Tired, decreased energy -  Change in appetite -  Feeling bad or failure about yourself  -  Trouble concentrating -  Moving slowly or fidgety/restless -  Suicidal thoughts -  PHQ-9 Score -  Difficult doing work/chores -    ADLs:  In your present state of health, do you have any difficulty performing the following activities: 05/13/2021 12/01/2020  Hearing? N N  Vision? N N  Difficulty concentrating or making decisions? N N  Walking or climbing stairs? N N  Dressing or bathing? N N   Doing errands, shopping? N N  Some recent data might be hidden    Cognitive Testing  Alert? Yes  Normal Appearance?Yes  Oriented to person? Yes  Place? Yes   Time? Yes  Recall of three objects?  Yes  Can perform simple calculations? Yes  Displays appropriate judgment?Yes  Can read the correct time from a watch face?Yes  EOL planning: Does Patient Have a Medical Advance Directive?: Yes Type of Advance Directive: Healthcare Power of Attorney, Living will Does patient want to make changes to medical advance directive?: No - Patient declined Copy of [place] in Chart?: No - copy requested   Objective:   Blood pressure 110/60, pulse 69, temperature (!) 96.5 F (35.8 C), weight 149 lb (67.6 kg), SpO2 95 %. Body mass index is 25.58 kg/m.  General appearance: alert, no distress, WD/WN,  female HEENT: normocephalic, sclerae anicteric, TMs pearly, nares patent, no discharge or erythema, pharynx normal, right inner nostril at septum with small tender nodule, non fluctuant  Oral cavity: MMM, no lesions Neck: supple, no lymphadenopathy, no thyromegaly, no masses Heart: RRR, bradycardia, no murmurs, clicks, rubs, gallop, normal S1, S2 Lungs: CTA bilaterally, no wheezes, rhonchi, or rales Abdomen: +bs, soft, non tender, no hepatomegaly, no splenomegaly Musculoskeletal: left ankle with mild swelling, heat, tenderness with palpation below malleolus, full ROM without laxity or pain; non-antalgic gait Extremities:  no cyanosis, no clubbing Pulses: 2+ symmetric, upper and lower extremities, normal cap refill Derm: warm, dry; she has plaque to right foot dorsum, approx 5 cm area, another to R lateral ankle Neurological: alert, oriented x 3, CN2-12 intact, strength normal upper extremities and lower extremities, sensation normal throughout, DTRs 2+ throughout, no cerebellar signs, gait normal Psychiatric: normal affect, behavior normal, pleasant   Medicare Attestation I have  personally reviewed: The patient's medical and social history Their use of alcohol, tobacco or illicit drugs Their current medications and supplements The patient's functional ability including ADLs,fall risks, home safety risks, cognitive, and hearing and visual impairment Diet and physical activities Evidence for depression or mood disorders  The patient's weight, height, BMI, and visual acuity have been recorded in the chart.  I have made referrals, counseling, and provided education to the patient based on review of the above and I have provided the patient with a written personalized care plan for preventive services.    Magda Bernheim ANP-C  [facility]  05/13/2021

## 2021-05-13 ENCOUNTER — Other Ambulatory Visit: Payer: Self-pay

## 2021-05-13 ENCOUNTER — Encounter: Payer: Self-pay | Admitting: Nurse Practitioner

## 2021-05-13 ENCOUNTER — Ambulatory Visit (INDEPENDENT_AMBULATORY_CARE_PROVIDER_SITE_OTHER): Payer: PPO | Admitting: Nurse Practitioner

## 2021-05-13 VITALS — BP 110/60 | HR 69 | Temp 96.5°F | Wt 149.0 lb

## 2021-05-13 DIAGNOSIS — E782 Mixed hyperlipidemia: Secondary | ICD-10-CM | POA: Diagnosis not present

## 2021-05-13 DIAGNOSIS — Z0001 Encounter for general adult medical examination with abnormal findings: Secondary | ICD-10-CM | POA: Diagnosis not present

## 2021-05-13 DIAGNOSIS — I700 Atherosclerosis of aorta: Secondary | ICD-10-CM

## 2021-05-13 DIAGNOSIS — Z79899 Other long term (current) drug therapy: Secondary | ICD-10-CM

## 2021-05-13 DIAGNOSIS — I059 Rheumatic mitral valve disease, unspecified: Secondary | ICD-10-CM | POA: Diagnosis not present

## 2021-05-13 DIAGNOSIS — Z1329 Encounter for screening for other suspected endocrine disorder: Secondary | ICD-10-CM

## 2021-05-13 DIAGNOSIS — R7309 Other abnormal glucose: Secondary | ICD-10-CM | POA: Diagnosis not present

## 2021-05-13 DIAGNOSIS — R6889 Other general symptoms and signs: Secondary | ICD-10-CM | POA: Diagnosis not present

## 2021-05-13 DIAGNOSIS — L4050 Arthropathic psoriasis, unspecified: Secondary | ICD-10-CM | POA: Diagnosis not present

## 2021-05-13 DIAGNOSIS — E559 Vitamin D deficiency, unspecified: Secondary | ICD-10-CM

## 2021-05-13 DIAGNOSIS — K5730 Diverticulosis of large intestine without perforation or abscess without bleeding: Secondary | ICD-10-CM | POA: Diagnosis not present

## 2021-05-13 DIAGNOSIS — Z203 Contact with and (suspected) exposure to rabies: Secondary | ICD-10-CM

## 2021-05-13 DIAGNOSIS — J439 Emphysema, unspecified: Secondary | ICD-10-CM

## 2021-05-13 DIAGNOSIS — Z0000 Encounter for general adult medical examination without abnormal findings: Secondary | ICD-10-CM

## 2021-05-13 DIAGNOSIS — M47812 Spondylosis without myelopathy or radiculopathy, cervical region: Secondary | ICD-10-CM | POA: Diagnosis not present

## 2021-05-13 DIAGNOSIS — I10 Essential (primary) hypertension: Secondary | ICD-10-CM

## 2021-05-13 DIAGNOSIS — Z87891 Personal history of nicotine dependence: Secondary | ICD-10-CM

## 2021-05-13 DIAGNOSIS — F3341 Major depressive disorder, recurrent, in partial remission: Secondary | ICD-10-CM | POA: Diagnosis not present

## 2021-05-13 NOTE — Patient Instructions (Signed)

## 2021-05-20 DIAGNOSIS — L4059 Other psoriatic arthropathy: Secondary | ICD-10-CM | POA: Diagnosis not present

## 2021-05-29 ENCOUNTER — Other Ambulatory Visit: Payer: Self-pay

## 2021-05-29 ENCOUNTER — Ambulatory Visit
Admission: RE | Admit: 2021-05-29 | Discharge: 2021-05-29 | Disposition: A | Discharge status: Home / Self Care | Payer: PPO | Source: Ambulatory Visit | Attending: Nurse Practitioner | Admitting: Nurse Practitioner

## 2021-05-29 DIAGNOSIS — Z87891 Personal history of nicotine dependence: Secondary | ICD-10-CM

## 2021-05-29 DIAGNOSIS — J439 Emphysema, unspecified: Secondary | ICD-10-CM

## 2021-05-29 DIAGNOSIS — F17210 Nicotine dependence, cigarettes, uncomplicated: Secondary | ICD-10-CM | POA: Diagnosis not present

## 2021-06-10 LAB — CBC WITH DIFFERENTIAL/PLATELET
Absolute Monocytes: 319 cells/uL (ref 200–950)
Basophils Absolute: 22 cells/uL (ref 0–200)
Basophils Relative: 0.4 %
Eosinophils Absolute: 129 cells/uL (ref 15–500)
Eosinophils Relative: 2.3 %
HCT: 33 % — ABNORMAL LOW (ref 35.0–45.0)
Hemoglobin: 11.3 g/dL — ABNORMAL LOW (ref 11.7–15.5)
Lymphs Abs: 1238 cells/uL (ref 850–3900)
MCH: 33.2 pg — ABNORMAL HIGH (ref 27.0–33.0)
MCHC: 34.2 g/dL (ref 32.0–36.0)
MCV: 97.1 fL (ref 80.0–100.0)
MPV: 9.5 fL (ref 7.5–12.5)
Monocytes Relative: 5.7 %
Neutro Abs: 3892 cells/uL (ref 1500–7800)
Neutrophils Relative %: 69.5 %
Platelets: 244 10*3/uL (ref 140–400)
RBC: 3.4 10*6/uL — ABNORMAL LOW (ref 3.80–5.10)
RDW: 13.1 % (ref 11.0–15.0)
Total Lymphocyte: 22.1 %
WBC: 5.6 10*3/uL (ref 3.8–10.8)

## 2021-06-10 LAB — COMPLETE METABOLIC PANEL WITH GFR
AG Ratio: 2.1 (calc) (ref 1.0–2.5)
ALT: 16 U/L (ref 6–29)
AST: 15 U/L (ref 10–35)
Albumin: 4.2 g/dL (ref 3.6–5.1)
Alkaline phosphatase (APISO): 74 U/L (ref 37–153)
BUN: 12 mg/dL (ref 7–25)
CO2: 28 mmol/L (ref 20–32)
Calcium: 9 mg/dL (ref 8.6–10.4)
Chloride: 106 mmol/L (ref 98–110)
Creat: 0.73 mg/dL (ref 0.60–1.00)
Globulin: 2 g/dL (calc) (ref 1.9–3.7)
Glucose, Bld: 90 mg/dL (ref 65–99)
Potassium: 4.5 mmol/L (ref 3.5–5.3)
Sodium: 139 mmol/L (ref 135–146)
Total Bilirubin: 0.5 mg/dL (ref 0.2–1.2)
Total Protein: 6.2 g/dL (ref 6.1–8.1)
eGFR: 87 mL/min/{1.73_m2} (ref >=60)

## 2021-06-10 LAB — LIPID PANEL
Cholesterol: 216 mg/dL — ABNORMAL HIGH (ref <200)
HDL: 81 mg/dL (ref >=50)
LDL Cholesterol (Calc): 115 mg/dL (calc) — ABNORMAL HIGH
Non-HDL Cholesterol (Calc): 135 mg/dL (calc) — ABNORMAL HIGH (ref <130)
Total CHOL/HDL Ratio: 2.7 (calc) (ref <5.0)
Triglycerides: 96 mg/dL (ref <150)

## 2021-06-10 LAB — TSH: TSH: 1.61 mIU/L (ref 0.40–4.50)

## 2021-06-10 LAB — HEMOGLOBIN A1C
Hgb A1c MFr Bld: 5.4 % of total Hgb (ref <5.7)
Mean Plasma Glucose: 108 mg/dL
eAG (mmol/L): 6 mmol/L

## 2021-06-10 LAB — RABIES VIRUS ANTIBODY TITER

## 2021-08-10 NOTE — Progress Notes (Signed)
COMPLETE PHYSICAL  Assessment:   Encounter for routine medical examination with abnormal findings Schedule mammogram, phone number given Due annually   Mixed hyperlipidemia Continue statin -continue medications, check lipids, decrease fatty foods, increase activity.   Tachycardia Taking metoprolol for rate control  Atherosclerosis of aorta Per CT 05/19/20 Control blood pressure, cholesterol, glucose, increase exercise.   Emphysema STOP SMOKING, uses inhaler PRN with flares, rare Current smoker on some days Getting annual CT lung cancer screening, done 05/29/21 negative repeat 1 year Smoking cessation- 3-5 min instruction/counseling given, counseled patient on the dangers of tobacco use, advised patient to stop smoking, and reviewed strategies to maximize success, patient not ready to quit at this time.   Hx of prediabetes / abnormal glucose Discussed general issues about diabetes pathophysiology and management., Educational material distributed., Suggested low cholesterol diet., Encouraged aerobic exercise., Discussed foot care., Reminded to get yearly retinal exam.   Vitamin D deficiency Continue supplement  Psoriatic arthritis/Cervical arthritis ([place]) Dr. Amil Amen following, now on methotrexate    Diverticulosus of colon  Bowel management; add fiber if not taking   Mitral valve disorder No SOB, CP, edema, monitor  Medication management Continued  Estrogen deficiency Dexa in 2023  Depression  stress management techniques discussed, increase water, good sleep hygiene discussed, increase exercise, and increase veggies.  - will continue zoloft for now, successfully tapered off of benzo  Anemia CBC  Psoriasis Follows with derm    Future Appointments  Date Time Provider [place]  09/16/2021  1:30 PM Leanor Kail, PA [facility]  05/13/2022 11:30 AM Magda Bernheim, NP [facility] None  08/15/2022  2:00 PM Magda Bernheim, NP [facility] None      Subjective:   [name] [name] is a 75 y.o. female who presents for complete physical and follow up for HTN, chol, preDM, and depression.    She is no longer taking the Myrbetriq and is doing Kegels exercises and the urine leakage is better controlled now.   She is sleeping wekll and states her mood is good.   She smokes 1 pack of cigarettes/week - on the weekend with a bottle of wine.  Has emphysema by imaging, asymptomatic, no perceived benefit with inhalers Hx of 30+ pack year history, had CT lung 04/2020 with small nodule recommended for annual screening.   Psoriatic arthritis/cervical arthritis, followed by Dr. Amil Amen. Previously used Methotrexate but got nausea and diarrhea so stopped medication. She has a follow up appointment next week. Arthritis is managed at this time.  BMI is Body mass index is 25.92 kg/m., she has not been working on diet and exercise. She has been eating more sweets and soda, has not been exercising Wt Readings from Last 3 Encounters:  08/12/21 151 lb (68.5 kg)  05/13/21 149 lb (67.6 kg)  12/02/20 140 lb 9.6 oz (63.8 kg)   She has aortic atherosclerosis per CT 08/2018 Follows with Dr. 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  She denies chest pain, shortness of breath, dizziness.   She is on cholesterol medication (atorvastatin 40 mg three times/ week) and denies myalgias. Her cholesterol is at goal. The cholesterol last visit was:   Lab Results  Component Value Date   CHOL 216 (H) 05/13/2021   HDL 81 05/13/2021   LDLCALC 115 (H) 05/13/2021  TRIG 96 05/13/2021   CHOLHDL 2.7 05/13/2021   She has been working on diet and exercise for hx of prediabetes recently well controlled, and denies paresthesia of the feet, polydipsia, polyuria and visual disturbances.  Last A1C in the office was:  Lab Results  Component Value Date   HGBA1C 5.4 05/13/2021    Lab Results  Component Value Date   GFRNONAA 72 12/02/2020   Patient is on Vitamin D supplement. Lab Results  Component Value Date   VD25OH 92 12/02/2020     Medication Review Current Outpatient Medications on File Prior to Visit  Medication Sig Dispense Refill   atorvastatin (LIPITOR) 40 MG tablet TAKE ONE TABLET BY MOUTH DAILY 3 TIMES PER WEEK FOR CHOLESTEROL 39 tablet 3   B Complex Vitamins (B COMPLEX PO) Take 1 tablet by mouth every other day.      Cholecalciferol (VITAMIN D3) 5000 UNITS TABS Take 5,000 Units by mouth 2 (two) times daily.     Magnesium 250 MG TABS Take 250 mg by mouth daily.      metoprolol tartrate (LOPRESSOR) 25 MG tablet TAKE ONE TABLET BY MOUTH DAILY 90 tablet 1   sertraline (ZOLOFT) 100 MG tablet Take  1 tablet  Daily  for Mood 90 tablet 3   folic acid (FOLVITE) 1 MG tablet Take 1 mg by mouth daily. (Patient not taking: Reported on 08/12/2021)     methotrexate (RHEUMATREX) 2.5 MG tablet Take 2.5 mg by mouth once a week. Per patient takes 8 tablets once a week------reported on 01/02/2020 (Patient not taking: Reported on 08/12/2021)     mirabegron ER (MYRBETRIQ) 50 MG TB24 tablet Take 50 mg by mouth daily. (Patient not taking: Reported on 08/12/2021)     No current facility-administered medications on file prior to visit.    Current Problems (verified) Patient Active Problem List   Diagnosis Date Noted   Anemia 05/06/2020   Current smoker on some days 04/25/2019   Depression, major, recurrent, in partial remission ([place]) 07/31/2018   Pulmonary emphysema ([place]) 06/13/2017   Aortic atherosclerosis ([place]) by CT Scan in 04/2020 06/13/2017   Psoriatic arthritis ([place]) 05/25/2016   Encounter for Medicare annual wellness exam 04/27/2015   Cervical arthritis ([place]) 10/10/2014   Medication management 10/02/2014   Mixed hyperlipidemia 07/01/2014   Hypertension    Other  abnormal glucose    Vitamin D deficiency    Mitral valve disorder 06/16/2009   Diverticulosis of colon 05/06/2008    Screening Tests Immunization History  Administered Date(s) Administered   DT (Pediatric) 04/27/2015   Influenza, High Dose Seasonal PF 07/10/2019, 08/12/2020, 07/19/2021   PFIZER(Purple Top)SARS-COV-2 Vaccination 10/11/2019, 11/03/2019, 06/05/2020   Pneumococcal Conjugate-13 04/27/2015   Pneumococcal Polysaccharide-23 12/22/2004, 11/24/2016   [place], IM 05/04/2016, 05/07/2016   Td 12/16/2003   Preventative care: Last colonoscopy: 12/2016, 5 year follow up, 2023 Due EGD 02/2015 Last mammogram: 04/2020  Last pap smear/pelvic exam: 11/2012 , 1 abnormal in her 20's, declines another DEXA: at OB/GYN-08/2015 normal DUE? Echo 2009 normal EF Ct AB 11/2014 Ct chest: 08/2018, small R nodule, follow up ordered  CXR: 2018 MRI abd: 08/2018 - benign renal cyst   Prior vaccinations: TD or Tdap: 2016 Influenza: 08/05/2021 Pneumococcal: 2018 Prevnar 13: 2016 Shingles/Zostavax: declines Covid 19: 2/2, 2021, pfizer , Booster 06/05/2020  Names of Other Physician/Practitioners you currently use: 1. [facility]- here for primary care 2. Dr. Sabra Heck, eye doctor, 08/2020 3. Dr. Rona Ravens, dentist, last visit 2022, q66m 4. Dr. Renda Rolls,  derm, last visit 2022, goes annually  Patient Care Team: Unk Pinto, MD as PCP - General (Internal Medicine) Josue Hector, MD as PCP - Cardiology (Cardiology) Justice Britain, MD as Consulting Physician (Orthopedic Surgery) Iran Planas, MD as Consulting Physician (Orthopedic Surgery) Thornell Sartorius, MD as Consulting Physician (Otolaryngology) Josue Hector, MD as Consulting Physician (Cardiology) Rana Snare, MD (Inactive) as Consulting Physician (Urology) Fontaine, Belinda Block, MD (Inactive) as Consulting Physician (Gynecology) Renda Rolls, Jennefer Bravo, MD as Referring Physician (Dermatology) Marica Otter, OD (Optometry) Merrilee Jansky , DDS   Allergies Allergies  Allergen Reactions   Bactrim [Sulfamethoxazole-Trimethoprim] Swelling    Face and eyes swollen and red.   Ketek [Telithromycin]     Unknown    SURGICAL HISTORY She  has a past surgical history that includes Tympanoplasty (Right); Rotator cuff repair (Left); Ectopic pregnancy surgery (1971, 1979); Hemorrhoid surgery; Oophorectomy; Gynecologic cryosurgery (1976); Abdominal hysterectomy (2000); Rotator cuff repair (Right, 2/15); Colonoscopy; Esophagogastroduodenoscopy; Upper gastrointestinal endoscopy; Ganglion cyst excision (Right); Finger surgery; Gastrocnemius Recession (Left, 06/25/2020); Calcaneal osteotomy (Left, 06/25/2020); Tenolysis (Left, 06/25/2020); and Foot arthrodesis (Left, 06/25/2020). FAMILY HISTORY Her family history includes Cirrhosis in her father; Colon cancer in her maternal grandmother; Colon polyps in her sister; Diabetes in her father and mother; Heart disease in her father, maternal grandfather, paternal grandfather, and paternal grandmother; Hyperlipidemia in her father; Hypertension in her father and mother; Lymphoma in her maternal grandmother; Melanoma in her mother; Stomach cancer in her maternal grandmother; Uterine cancer in her sister. SOCIAL HISTORY She  reports that she quit smoking about 14 months ago. Her smoking use included cigarettes. She started smoking about 55 years ago. She has a 32.40 pack-year smoking history. She has never used smokeless tobacco. She reports current alcohol use. She reports that she does not use drugs.  Review of Systems  Constitutional:  Negative for chills and fever.  HENT:  Negative for congestion, hearing loss, sinus pain, sore throat and tinnitus.   Eyes:  Negative for blurred vision and double vision.       Floaters in right eye  Respiratory:  Negative for cough, hemoptysis, sputum production, shortness of breath and wheezing.   Cardiovascular:  Negative for  chest pain, palpitations and leg swelling.  Gastrointestinal:  Positive for diarrhea. Negative for abdominal pain, constipation, heartburn, nausea and vomiting.  Genitourinary:  Negative for dysuria and urgency.  Musculoskeletal:  Positive for joint pain. Negative for back pain, falls, myalgias and neck pain.  Skin:  Negative for rash.  Neurological:  Negative for dizziness, tingling, tremors, weakness and headaches.  Endo/Heme/Allergies:  Does not bruise/bleed easily.  Psychiatric/Behavioral:  Negative for depression and suicidal ideas. The patient is not nervous/anxious and does not have insomnia.    Objective:   Blood pressure 100/60, pulse 60, temperature (!) 97.2 F (36.2 C), height 5\' 4"  (1.626 m), weight 151 lb (68.5 kg), SpO2 97 %. Body mass index is 25.92 kg/m.  General appearance: alert, no distress, WD/WN,  female HEENT: normocephalic, sclerae anicteric, TMs pearly, nares patent, no discharge or erythema, pharynx normal, right inner nostril at septum with small tender nodule, non fluctuant  Oral cavity: MMM, has a mucocele on roof of mouth , followed by dentist Neck: supple, no lymphadenopathy, no thyromegaly, no masses Heart: RRR, bradycardia, no murmurs, clicks, rubs, gallop, normal S1, S2 Lungs: CTA bilaterally, no wheezes, rhonchi, or rales Abdomen: +bs, soft, non tender, no hepatomegaly, no splenomegaly Musculoskeletal:  full ROM without laxity or pain; non-antalgic gait Extremities:  no  cyanosis, no clubbing Pulses: 2+ symmetric, upper and lower extremities, normal cap refill Derm: warm, dry; she has plaque to right foot dorsum, approx 5 cm area, another to R lateral ankle Neurological: alert, oriented x 3, CN2-12 intact, strength normal upper extremities and lower extremities, sensation normal throughout, DTRs 2+ throughout, no cerebellar signs, gait normal Psychiatric: normal affect, behavior normal, pleasant  EKG: sinus bradycardia, no ST changes  [facility]  08/12/2021

## 2021-08-12 ENCOUNTER — Encounter: Payer: Self-pay | Admitting: Nurse Practitioner

## 2021-08-12 ENCOUNTER — Other Ambulatory Visit: Payer: Self-pay

## 2021-08-12 ENCOUNTER — Ambulatory Visit (INDEPENDENT_AMBULATORY_CARE_PROVIDER_SITE_OTHER): Payer: PPO | Admitting: Nurse Practitioner

## 2021-08-12 VITALS — BP 100/60 | HR 60 | Temp 97.2°F | Ht 64.0 in | Wt 151.0 lb

## 2021-08-12 DIAGNOSIS — E559 Vitamin D deficiency, unspecified: Secondary | ICD-10-CM | POA: Diagnosis not present

## 2021-08-12 DIAGNOSIS — Z0001 Encounter for general adult medical examination with abnormal findings: Secondary | ICD-10-CM

## 2021-08-12 DIAGNOSIS — F17210 Nicotine dependence, cigarettes, uncomplicated: Secondary | ICD-10-CM

## 2021-08-12 DIAGNOSIS — J439 Emphysema, unspecified: Secondary | ICD-10-CM

## 2021-08-12 DIAGNOSIS — I10 Essential (primary) hypertension: Secondary | ICD-10-CM

## 2021-08-12 DIAGNOSIS — F3341 Major depressive disorder, recurrent, in partial remission: Secondary | ICD-10-CM

## 2021-08-12 DIAGNOSIS — Z136 Encounter for screening for cardiovascular disorders: Secondary | ICD-10-CM

## 2021-08-12 DIAGNOSIS — Z0000 Encounter for general adult medical examination without abnormal findings: Secondary | ICD-10-CM | POA: Diagnosis not present

## 2021-08-12 DIAGNOSIS — K5730 Diverticulosis of large intestine without perforation or abscess without bleeding: Secondary | ICD-10-CM

## 2021-08-12 DIAGNOSIS — Z79899 Other long term (current) drug therapy: Secondary | ICD-10-CM

## 2021-08-12 DIAGNOSIS — L4050 Arthropathic psoriasis, unspecified: Secondary | ICD-10-CM

## 2021-08-12 DIAGNOSIS — F17200 Nicotine dependence, unspecified, uncomplicated: Secondary | ICD-10-CM

## 2021-08-12 DIAGNOSIS — I059 Rheumatic mitral valve disease, unspecified: Secondary | ICD-10-CM

## 2021-08-12 DIAGNOSIS — E782 Mixed hyperlipidemia: Secondary | ICD-10-CM

## 2021-08-12 DIAGNOSIS — L409 Psoriasis, unspecified: Secondary | ICD-10-CM

## 2021-08-12 DIAGNOSIS — D649 Anemia, unspecified: Secondary | ICD-10-CM

## 2021-08-12 DIAGNOSIS — M47812 Spondylosis without myelopathy or radiculopathy, cervical region: Secondary | ICD-10-CM

## 2021-08-12 DIAGNOSIS — Z1389 Encounter for screening for other disorder: Secondary | ICD-10-CM | POA: Diagnosis not present

## 2021-08-12 DIAGNOSIS — R7309 Other abnormal glucose: Secondary | ICD-10-CM

## 2021-08-12 DIAGNOSIS — I700 Atherosclerosis of aorta: Secondary | ICD-10-CM

## 2021-08-12 NOTE — Patient Instructions (Signed)

## 2021-08-13 LAB — COMPLETE METABOLIC PANEL WITH GFR
AG Ratio: 1.6 (calc) (ref 1.0–2.5)
ALT: 12 U/L (ref 6–29)
AST: 13 U/L (ref 10–35)
Albumin: 4.2 g/dL (ref 3.6–5.1)
Alkaline phosphatase (APISO): 83 U/L (ref 37–153)
BUN: 16 mg/dL (ref 7–25)
CO2: 28 mmol/L (ref 20–32)
Calcium: 9.4 mg/dL (ref 8.6–10.4)
Chloride: 104 mmol/L (ref 98–110)
Creat: 0.81 mg/dL (ref 0.60–1.00)
Globulin: 2.7 g/dL (calc) (ref 1.9–3.7)
Glucose, Bld: 85 mg/dL (ref 65–99)
Potassium: 4.8 mmol/L (ref 3.5–5.3)
Sodium: 139 mmol/L (ref 135–146)
Total Bilirubin: 0.5 mg/dL (ref 0.2–1.2)
Total Protein: 6.9 g/dL (ref 6.1–8.1)
eGFR: 76 mL/min/{1.73_m2} (ref >=60)

## 2021-08-13 LAB — CBC WITH DIFFERENTIAL/PLATELET
Absolute Monocytes: 473 cells/uL (ref 200–950)
Basophils Absolute: 42 cells/uL (ref 0–200)
Basophils Relative: 0.5 %
Eosinophils Absolute: 249 cells/uL (ref 15–500)
Eosinophils Relative: 3 %
HCT: 36.8 % (ref 35.0–45.0)
Hemoglobin: 12.5 g/dL (ref 11.7–15.5)
Lymphs Abs: 2457 cells/uL (ref 850–3900)
MCH: 32.1 pg (ref 27.0–33.0)
MCHC: 34 g/dL (ref 32.0–36.0)
MCV: 94.6 fL (ref 80.0–100.0)
MPV: 9.6 fL (ref 7.5–12.5)
Monocytes Relative: 5.7 %
Neutro Abs: 5080 cells/uL (ref 1500–7800)
Neutrophils Relative %: 61.2 %
Platelets: 286 10*3/uL (ref 140–400)
RBC: 3.89 10*6/uL (ref 3.80–5.10)
RDW: 11.8 % (ref 11.0–15.0)
Total Lymphocyte: 29.6 %
WBC: 8.3 10*3/uL (ref 3.8–10.8)

## 2021-08-13 LAB — URINALYSIS, ROUTINE W REFLEX MICROSCOPIC
Bacteria, UA: NONE SEEN /HPF
Bilirubin Urine: NEGATIVE
Glucose, UA: NEGATIVE
Hyaline Cast: NONE SEEN /LPF
Ketones, ur: NEGATIVE
Leukocytes,Ua: NEGATIVE
Nitrite: NEGATIVE
Protein, ur: NEGATIVE
Specific Gravity, Urine: 1.012 (ref 1.001–1.035)
Squamous Epithelial / HPF: NONE SEEN /HPF (ref <=5)
WBC, UA: NONE SEEN /HPF (ref 0–5)
pH: 5.5 (ref 5.0–8.0)

## 2021-08-13 LAB — LIPID PANEL
Cholesterol: 193 mg/dL (ref <200)
HDL: 76 mg/dL (ref >=50)
LDL Cholesterol (Calc): 96 mg/dL (calc)
Non-HDL Cholesterol (Calc): 117 mg/dL (calc) (ref <130)
Total CHOL/HDL Ratio: 2.5 (calc) (ref <5.0)
Triglycerides: 116 mg/dL (ref <150)

## 2021-08-13 LAB — VITAMIN D 25 HYDROXY (VIT D DEFICIENCY, FRACTURES): Vit D, 25-Hydroxy: 79 ng/mL (ref 30–100)

## 2021-08-13 LAB — MICROALBUMIN / CREATININE URINE RATIO
Creatinine, Urine: 51 mg/dL (ref 20–275)
Microalb Creat Ratio: 20 mcg/mg creat (ref <30)
Microalb, Ur: 1 mg/dL

## 2021-08-13 LAB — MICROSCOPIC MESSAGE

## 2021-08-13 LAB — TSH: TSH: 1.81 mIU/L (ref 0.40–4.50)

## 2021-08-13 LAB — MAGNESIUM: Magnesium: 2.2 mg/dL (ref 1.5–2.5)

## 2021-08-17 ENCOUNTER — Other Ambulatory Visit: Payer: Self-pay

## 2021-08-19 DIAGNOSIS — E663 Overweight: Secondary | ICD-10-CM | POA: Diagnosis not present

## 2021-08-19 DIAGNOSIS — M25572 Pain in left ankle and joints of left foot: Secondary | ICD-10-CM | POA: Diagnosis not present

## 2021-08-19 DIAGNOSIS — Z6825 Body mass index (BMI) 25.0-25.9, adult: Secondary | ICD-10-CM | POA: Diagnosis not present

## 2021-08-19 DIAGNOSIS — L4059 Other psoriatic arthropathy: Secondary | ICD-10-CM | POA: Diagnosis not present

## 2021-08-30 ENCOUNTER — Other Ambulatory Visit: Payer: Self-pay

## 2021-08-30 ENCOUNTER — Emergency Department (HOSPITAL_BASED_OUTPATIENT_CLINIC_OR_DEPARTMENT_OTHER)
Admission: EM | Admit: 2021-08-30 | Discharge: 2021-08-30 | Disposition: A | Discharge status: Home / Self Care | Payer: PPO | Attending: Emergency Medicine | Admitting: Emergency Medicine

## 2021-08-30 ENCOUNTER — Encounter (HOSPITAL_BASED_OUTPATIENT_CLINIC_OR_DEPARTMENT_OTHER): Payer: Self-pay | Admitting: Obstetrics and Gynecology

## 2021-08-30 ENCOUNTER — Emergency Department (HOSPITAL_BASED_OUTPATIENT_CLINIC_OR_DEPARTMENT_OTHER): Payer: PPO

## 2021-08-30 DIAGNOSIS — N281 Cyst of kidney, acquired: Secondary | ICD-10-CM | POA: Diagnosis not present

## 2021-08-30 DIAGNOSIS — Z79899 Other long term (current) drug therapy: Secondary | ICD-10-CM | POA: Diagnosis not present

## 2021-08-30 DIAGNOSIS — R3129 Other microscopic hematuria: Secondary | ICD-10-CM | POA: Diagnosis not present

## 2021-08-30 DIAGNOSIS — R3121 Asymptomatic microscopic hematuria: Secondary | ICD-10-CM | POA: Diagnosis not present

## 2021-08-30 DIAGNOSIS — K5792 Diverticulitis of intestine, part unspecified, without perforation or abscess without bleeding: Secondary | ICD-10-CM

## 2021-08-30 DIAGNOSIS — K5732 Diverticulitis of large intestine without perforation or abscess without bleeding: Secondary | ICD-10-CM | POA: Diagnosis not present

## 2021-08-30 DIAGNOSIS — R109 Unspecified abdominal pain: Secondary | ICD-10-CM | POA: Diagnosis not present

## 2021-08-30 DIAGNOSIS — R1032 Left lower quadrant pain: Secondary | ICD-10-CM | POA: Diagnosis present

## 2021-08-30 LAB — COMPREHENSIVE METABOLIC PANEL
ALT: 12 U/L (ref 0–44)
AST: 13 U/L — ABNORMAL LOW (ref 15–41)
Albumin: 4 g/dL (ref 3.5–5.0)
Alkaline Phosphatase: 68 U/L (ref 38–126)
Anion gap: 9 (ref 5–15)
BUN: 16 mg/dL (ref 8–23)
CO2: 23 mmol/L (ref 22–32)
Calcium: 9.4 mg/dL (ref 8.9–10.3)
Chloride: 104 mmol/L (ref 98–111)
Creatinine, Ser: 0.78 mg/dL (ref 0.44–1.00)
GFR, Estimated: >60 mL/min (ref >60)
Glucose, Bld: 94 mg/dL (ref 70–99)
Potassium: 4.3 mmol/L (ref 3.5–5.1)
Sodium: 136 mmol/L (ref 135–145)
Total Bilirubin: 0.5 mg/dL (ref 0.3–1.2)
Total Protein: 7.1 g/dL (ref 6.5–8.1)

## 2021-08-30 LAB — URINALYSIS, ROUTINE W REFLEX MICROSCOPIC
Bilirubin Urine: NEGATIVE
Glucose, UA: NEGATIVE mg/dL
Ketones, ur: NEGATIVE mg/dL
Nitrite: NEGATIVE
Protein, ur: NEGATIVE mg/dL
RBC / HPF: >50 RBC/hpf — ABNORMAL HIGH (ref 0–5)
Specific Gravity, Urine: 1.021 (ref 1.005–1.030)
pH: 5 (ref 5.0–8.0)

## 2021-08-30 LAB — CBC
HCT: 36.9 % (ref 36.0–46.0)
Hemoglobin: 12.1 g/dL (ref 12.0–15.0)
MCH: 31.4 pg (ref 26.0–34.0)
MCHC: 32.8 g/dL (ref 30.0–36.0)
MCV: 95.8 fL (ref 80.0–100.0)
Platelets: 226 10*3/uL (ref 150–400)
RBC: 3.85 MIL/uL — ABNORMAL LOW (ref 3.87–5.11)
RDW: 12.7 % (ref 11.5–15.5)
WBC: 8.8 10*3/uL (ref 4.0–10.5)
nRBC: 0 % (ref 0.0–0.2)

## 2021-08-30 LAB — LIPASE, BLOOD: Lipase: 18 U/L (ref 11–51)

## 2021-08-30 MED ORDER — CIPROFLOXACIN HCL 500 MG PO TABS: 500.0000 mg | ORAL_TABLET | Freq: Two times a day (BID) | ORAL | 0 refills | Status: DC

## 2021-08-30 MED ORDER — CIPROFLOXACIN HCL 500 MG PO TABS
500.0000 mg | ORAL_TABLET | Freq: Once | ORAL | Status: AC
  Administered 2021-08-30: 500 mg via ORAL
  Filled 2021-08-30: qty 1

## 2021-08-30 MED ORDER — METRONIDAZOLE 500 MG PO TABS: 500.0000 mg | ORAL_TABLET | Freq: Three times a day (TID) | ORAL | 0 refills | Status: DC

## 2021-08-30 MED ORDER — IOHEXOL 300 MG/ML  SOLN
75.0000 mL | Freq: Once | INTRAMUSCULAR | Status: AC | PRN
  Administered 2021-08-30: 75 mL via INTRAVENOUS

## 2021-08-30 MED ORDER — METRONIDAZOLE 500 MG PO TABS
500.0000 mg | ORAL_TABLET | Freq: Once | ORAL | Status: AC
  Administered 2021-08-30: 500 mg via ORAL
  Filled 2021-08-30: qty 1

## 2021-08-30 NOTE — Discharge Instructions (Addendum)
Please read and follow all provided instructions.  Your diagnoses today include:  1. Acute diverticulitis   2. Asymptomatic microscopic hematuria     Tests performed today include: Blood cell counts and platelets: normal white blood cell count Kidney and liver function tests Pancreas function test (called lipase) Urine test to look for infection: No infection, however you do have some blood noted in the urine.  Please have this followed up by your primary care doctor CT scan of the abdomen and pelvis: Shows acute diverticulitis, cysts on the kidneys Vital signs. See below for your results today.   Medications prescribed:  Ciprofloxacin - antibiotic  You have been prescribed an antibiotic medicine: take the entire course of medicine even if you are feeling better. Stopping early can cause the antibiotic not to work.  Metronidazole - antibiotic  You have been prescribed an antibiotic medicine: take the entire course of medicine even if you are feeling better. Stopping early can cause the antibiotic not to work. Do not drink alcohol when taking this medication.   Take any prescribed medications only as directed.  Home care instructions:  Follow any educational materials contained in this packet.  Follow-up instructions: Please follow-up with your primary care provider in the next 3 days for further evaluation of your symptoms.    Return instructions:  SEEK IMMEDIATE MEDICAL ATTENTION IF: The pain does not go away or becomes severe  A temperature above 101F develops  Repeated vomiting occurs (multiple episodes)  The pain becomes localized to portions of the abdomen. The right side could possibly be appendicitis. In an adult, the left lower portion of the abdomen could be colitis or diverticulitis.  Blood is being passed in stools or vomit (bright red or black tarry stools)  You develop chest pain, difficulty breathing, dizziness or fainting, or become confused, poorly responsive,  or inconsolable (young children) If you have any other emergent concerns regarding your health  Additional Information: Abdominal (belly) pain can be caused by many things. Your caregiver performed an examination and possibly ordered blood/urine tests and imaging (CT scan, x-rays, ultrasound). Many cases can be observed and treated at home after initial evaluation in the emergency department. Even though you are being discharged home, abdominal pain can be unpredictable. Therefore, you need a repeated exam if your pain does not resolve, returns, or worsens. Most patients with abdominal pain don't have to be admitted to the hospital or have surgery, but serious problems like appendicitis and gallbladder attacks can start out as nonspecific pain. Many abdominal conditions cannot be diagnosed in one visit, so follow-up evaluations are very important.  Your vital signs today were: BP 139/73    Pulse 70    Temp 98.3 F (36.8 C)    Resp 11    Ht 5\' 4"  (1.626 m)    Wt 69 kg    SpO2 97%    BMI 26.11 kg/m  If your blood pressure (bp) was elevated above 135/85 this visit, please have this repeated by your doctor within one month. --------------

## 2021-08-30 NOTE — ED Triage Notes (Signed)
Patient reports to the ER for abdominal pain that she is concerned for diverticulitis. Patient reports she started with some periumbilical pain about 4-5 days ago that has since moved to be predominantly in the LLQ. Patient reports a hx of diverticulitis and states this feels similar but she has had more nausea with this event than previously.

## 2021-08-30 NOTE — ED Provider Notes (Signed)
[facility] EMER[facility]ENC[facility] DEPT Provider Note   CSN: [id number] Arrival date & time: 08/30/21  1326     History  Chief Complaint  Patient presents with   Abdominal Pain    [name] [name] is a 76 y.o. female.  Patient with history of diverticulitis, previous surgical history of hysterectomy and ectopic pregnancy --presents to the emergency department for 1 week history of diarrhea without blood, lower abdominal pain, now more severe in the left lower quadrant.  Patient states that symptoms are reminiscent of diverticulitis.  No urinary symptoms or hematuria.  No fevers.  She has had nausea without vomiting.  The onset of this condition was acute. The course is constant. Aggravating factors: none. Alleviating factors: none.        Home Medications Prior to Admission medications   Medication Sig Start Date End Date Taking? Authorizing Provider  atorvastatin (LIPITOR) 40 MG tablet TAKE ONE TABLET BY MOUTH DAILY 3 TIMES PER WEEK FOR CHOLESTEROL 12/28/20   Liane Comber, NP  B Complex Vitamins (B COMPLEX PO) Take 1 tablet by mouth every other day.     [provider]  Cholecalciferol (VITAMIN D3) 5000 UNITS TABS Take 5,000 Units by mouth 2 (two) times daily.    [provider]  Magnesium 250 MG TABS Take 250 mg by mouth daily.     [provider]  metoprolol tartrate (LOPRESSOR) 25 MG tablet TAKE ONE TABLET BY MOUTH DAILY 10/14/20   Josue Hector, MD  sertraline (ZOLOFT) 100 MG tablet Take  1 tablet  Daily  for Mood 01/15/21   Unk Pinto, MD      Allergies    Bactrim [sulfamethoxazole-trimethoprim] and Ketek [telithromycin]    Review of Systems   Review of Systems  Constitutional:  Negative for fever.  HENT:  Negative for rhinorrhea and sore throat.   Eyes:  Negative for redness.  Respiratory:  Negative for cough.   Cardiovascular:  Negative for chest pain.  Gastrointestinal:  Positive for diarrhea and nausea. Negative for abdominal  pain and vomiting.  Genitourinary:  Negative for dysuria, frequency, hematuria and urgency.  Musculoskeletal:  Negative for myalgias.  Skin:  Negative for rash.  Neurological:  Negative for headaches.   Physical Exam Updated Vital Signs BP (!) 153/65 (BP Location: Right Arm)    Pulse 60    Temp 98.3 F (36.8 C)    Resp 14    Ht 5\' 4"  (1.626 m)    Wt 69 kg    SpO2 98%    BMI 26.11 kg/m  Physical Exam Vitals and nursing note reviewed.  Constitutional:      General: She is not in acute distress.    Appearance: She is well-developed.  HENT:     Head: Normocephalic and atraumatic.     Right Ear: External ear normal.     Left Ear: External ear normal.     Nose: Nose normal.  Eyes:     Conjunctiva/sclera: Conjunctivae normal.  Cardiovascular:     Rate and Rhythm: Normal rate and regular rhythm.     Heart sounds: No murmur heard. Pulmonary:     Effort: No respiratory distress.     Breath sounds: No wheezing, rhonchi or rales.  Abdominal:     Palpations: Abdomen is soft.     Tenderness: There is abdominal tenderness (Mild) in the suprapubic area and left lower quadrant. There is no guarding or rebound.  Musculoskeletal:     Cervical back: Normal range of motion and  neck supple.     Right lower leg: No edema.     Left lower leg: No edema.  Skin:    General: Skin is warm and dry.     Findings: No rash.  Neurological:     General: No focal deficit present.     Mental Status: She is alert. Mental status is at baseline.     Motor: No weakness.  Psychiatric:        Mood and Affect: Mood normal.    ED Results / Procedures / Treatments   Labs (all labs ordered are listed, but only abnormal results are displayed) Labs Reviewed  COMPREHENSIVE METABOLIC PANEL - Abnormal; Notable for the following components:      Result Value   AST 13 (*)    All other components within normal limits  CBC - Abnormal; Notable for the following components:   RBC 3.85 (*)    All other components  within normal limits  URINALYSIS, ROUTINE W REFLEX MICROSCOPIC - Abnormal; Notable for the following components:   Hgb urine dipstick LARGE (*)    Leukocytes,Ua LARGE (*)    RBC / HPF >50 (*)    All other components within normal limits  LIPASE, BLOOD    EKG None  Radiology No results found.  Procedures Procedures    Medications Ordered in ED Medications - No data to display  ED Course/ Medical Decision Making/ A&P    Patient seen and examined. Plan discussed with patient.   Labs: Reviewed, reassuring.  This includes CBC, CMP.  UA does demonstrate blood without signs of infection.  Unclear source.  Imaging: CT ordered to evaluate for diverticulitis, kidney stone, other intra-abdominal pathology  Medications/Fluids: Offered pain medication, patient declines  Vital signs reviewed and are as follows: BP (!) 153/65 (BP Location: Right Arm)    Pulse 60    Temp 98.3 F (36.8 C)    Resp 14    Ht 5\' 4"  (1.626 m)    Wt 69 kg    SpO2 98%    BMI 26.11 kg/m   Initial impression: Abdominal pain, evaluate for diverticulitis.  Microscopic hematuria without irritative symptoms.  8:08 PM On re-exam patient appears comfortable.  Discussed pertinent results with patient at bedside. This included CT results positive for diverticulitis and reviewed urinalysis with signs of blood. They are comfortable with discharge at this time.   Home treatment: Prescription written for Cipro and Flagyl. Counseled to use tylenol and ibuprofen for supportive treatment.   Follow-up: Encouraged patient to follow-up with their provider in 3 days for recheck.  She will also need recheck for painless hematuria.  Encouraged return to ED with worsening pain, fever, vomiting. Patient verbalized understanding and agreed with plan.                             Medical Decision Making  Patient with abdominal pain, left lower quadrant, similar to previous episodes of diverticulitis. Vitals are stable, no fever.  Labs with normal white blood cell count, hematuria microscopic. Imaging with renal cysts as well as sigmoid diverticulitis, uncomplicated. No signs of dehydration, patient is tolerating PO's. Lungs are clear and no signs suggestive of PNA. Low concern for appendicitis, cholecystitis, pancreatitis, ruptured viscus, UTI, kidney stone, aortic dissection, aortic aneurysm or other emergent abdominal etiology. Supportive therapy indicated with return if symptoms worsen.          Final Clinical Impression(s) / ED Diagnoses Final diagnoses:  Acute diverticulitis  Asymptomatic microscopic hematuria    Rx / DC Orders ED Discharge Orders          Ordered    ciprofloxacin (CIPRO) 500 MG tablet  2 times daily        08/30/21 1953    metroNIDAZOLE (FLAGYL) 500 MG tablet  3 times daily        08/30/21 1953              Carlisle Cater, Hershal Coria 08/30/21 2009    Blanchie Dessert, MD 09/01/21 1229

## 2021-09-14 NOTE — Progress Notes (Signed)
Cardiology Office Note:    Date:  09/16/2021   ID:  [name] [name], DOB 09/22/1945, MRN [id number]  PCP:  Unk Pinto, MD  [facility] Cardiologist:  Jenkins Rouge, MD  [facility] Electrophysiologist:  None   Chief Complaint: Yearly follow-up  History of Present Illness:    [name] [name] is a 76 y.o. female with a hx of hypertension, COPD, hyperlipidemia, PAC, palpitation, psoriasis on methotrexate, prior tobacco smoker and anemia presented for follow-up.  Most recently seen in ER January 2nd 2023 for abdominal pain. She was found to have diverticulitis on CT scan.  Discharged home from ER on antibiotic.  Patient is here for follow-up.  She denies chest pain, shortness of breath, orthopnea, PND, syncope, lower extremity edema or melena.  No regular exercise but goes up and down of stairs at home without any problem.  Rare palpitations/PAC is very stable on metoprolol.  Past Medical History:  Diagnosis Date   Acquired short Achilles tendon, left    Anemia    Anxiety    Arthritis    hands and spine   Blood transfusion without reported diagnosis    Cervical dysplasia 4098   Complication of anesthesia    COPD (chronic obstructive pulmonary disease) (HCC)    Depression    Diverticulitis of colon (without mention of hemorrhage)(562.11)    Diverticulosis of colon (without mention of hemorrhage)    Elevated cholesterol    GERD (gastroesophageal reflux disease)    no meds now   Heart murmur    Hypertension    pt states she takes med for tachycardia NOT HTN -metoprolol taken for tachycardia   Internal hemorrhoids without mention of complication    Mitral valve disorders(424.0)    MRSA infection    Osteoporosis    spine   Palpitations    Pelvic adhesive disease    Plaque psoriasis    PONV (postoperative nausea and vomiting)    Prediabetes    Tachycardia    never had HTN   Vitamin D deficiency     Past Surgical History:  Procedure Laterality Date    ABDOMINAL HYSTERECTOMY  2000   TAH,BSO endometriosis, leiomyomata   CALCANEAL OSTEOTOMY Left 06/25/2020   Procedure: Medializing calcaneal osteotomy;  Surgeon: Wylene Simmer, MD;  Location: [place];  Service: Orthopedics;  Laterality: Left;   COLONOSCOPY     ECTOPIC PREGNANCY SURGERY  1971, 1979   ESOPHAGOGASTRODUODENOSCOPY     FINGER SURGERY     FOOT ARTHRODESIS Left 06/25/2020   Procedure: Talonavicular Arthrodesis;  Surgeon: Wylene Simmer, MD;  Location: [place];  Service: Orthopedics;  Laterality: Left;   GANGLION CYST EXCISION Right    hand    GASTROCNEMIUS RECESSION Left 06/25/2020   Procedure: Left gastroc recession;  Surgeon: Wylene Simmer, MD;  Location: [place];  Service: Orthopedics;  Laterality: Left;   GYNECOLOGIC CRYOSURGERY  1976   HEMORRHOID SURGERY     OOPHORECTOMY     ROTATOR CUFF REPAIR Left    ROTATOR CUFF REPAIR Right 2/15   TENOLYSIS Left 06/25/2020   Procedure: Left Posterior Tibial Tenolysis;  Surgeon: Wylene Simmer, MD;  Location: [place];  Service: Orthopedics;  Laterality: Left;   TYMPANOPLASTY Right    UPPER GASTROINTESTINAL ENDOSCOPY      Current Medications: Current Meds  Medication Sig   atorvastatin (LIPITOR) 40 MG tablet TAKE ONE TABLET BY MOUTH DAILY 3 TIMES PER WEEK FOR CHOLESTEROL   B Complex Vitamins (B  COMPLEX PO) Take 1 tablet by mouth every other day.    Cholecalciferol (VITAMIN D3) 5000 UNITS TABS Take 5,000 Units by mouth 2 (two) times daily.   Magnesium 250 MG TABS Take 250 mg by mouth daily.    metoprolol tartrate (LOPRESSOR) 25 MG tablet TAKE ONE TABLET BY MOUTH DAILY   sertraline (ZOLOFT) 100 MG tablet Take  1 tablet  Daily  for Mood     Allergies:   Bactrim [sulfamethoxazole-trimethoprim] and Ketek [telithromycin]   Social History   Socioeconomic History   Marital status: Married    Spouse name: Not on file   Number of children: 0   Years of education: Not on  file   Highest education level: Not on file  Occupational History   Occupation: retired  Tobacco Use   Smoking status: Some Days    Packs/day: 0.60    Years: 54.00    Pack years: 32.40    Types: Cigarettes    Start date: 1967    Last attempt to quit: 06/10/2020    Years since quitting: 1.2   Smokeless tobacco: Never   Tobacco comments:    Smokes when she drinks wine  Vaping Use   Vaping Use: Never used  Substance and Sexual Activity   Alcohol use: Yes    Comment: social   Drug use: No   Sexual activity: Yes    Birth control/protection: Surgical  Other Topics Concern   Not on file  Social History Narrative   She lives with husband.  They do not have children.   She previously worked at [employer].   Highest level of education:  High school    Social Determinants of Health   Financial Resource Strain: Not on file  Food Insecurity: Not on file  Transportation Needs: Not on file  Physical Activity: Not on file  Stress: Not on file  Social Connections: Not on file     Family History: The patient's family history includes Cirrhosis in her father; Colon cancer in her maternal grandmother; Colon polyps in her sister; Diabetes in her father and mother; Heart disease in her father, maternal grandfather, paternal grandfather, and paternal grandmother; Hyperlipidemia in her father; Hypertension in her father and mother; Lymphoma in her maternal grandmother; Melanoma in her mother; Stomach cancer in her maternal grandmother; Uterine cancer in her sister. There is no history of Esophageal cancer, Rectal cancer, Kidney disease, or Pancreatic cancer.    ROS:   Please see the history of present illness.    All other systems reviewed and are negative.   EKGs/Labs/Other Studies Reviewed:    The following studies were reviewed today: CT CHEST WITHOUT CONTRAST LOW-DOSE FOR LUNG CANCER SCREENING 05/2021 IMPRESSION: 1. Lung-RADS 1, negative. Continue annual screening with  low-dose chest CT without contrast in 12 months. 2. Aortic Atherosclerosis (ICD10-I70.0) and Emphysema (ICD10-J43.9).  EKG:  EKG is  not ordered today.    Recent Labs: 08/12/2021: Magnesium 2.2; TSH 1.81 08/30/2021: ALT 12; BUN 16; Creatinine, Ser 0.78; Hemoglobin 12.1; Platelets 226; Potassium 4.3; Sodium 136  Recent Lipid Panel    Component Value Date/Time   CHOL 193 08/12/2021 1453   TRIG 116 08/12/2021 1453   HDL 76 08/12/2021 1453   CHOLHDL 2.5 08/12/2021 1453   VLDL 13 03/08/2017 1518   LDLCALC 96 08/12/2021 1453    Physical Exam:    VS:  BP 110/62 (BP Location: Right Arm, Patient Position: Sitting, Cuff Size: Normal)    Pulse (!) 57    Ht  5\' 4"  (1.626 m)    Wt 148 lb 9.6 oz (67.4 kg)    SpO2 96%    BMI 25.51 kg/m     Wt Readings from Last 3 Encounters:  09/16/21 148 lb 9.6 oz (67.4 kg)  08/30/21 152 lb 1.9 oz (69 kg)  08/12/21 151 lb (68.5 kg)     GEN:  Well nourished, well developed in no acute distress HEENT: Normal NECK: No JVD; No carotid bruits LYMPHATICS: No lymphadenopathy CARDIAC: RRR, no murmurs, rubs, gallops RESPIRATORY:  Clear to auscultation without rales, wheezing or rhonchi  ABDOMEN: Soft, non-tender, non-distended MUSCULOSKELETAL:  No edema; No deformity  SKIN: Warm and dry NEUROLOGIC:  Alert and oriented x 3 PSYCHIATRIC:  Normal affect   ASSESSMENT AND PLAN:    PACs/palpitation -Felt benign -Continue metoprolol tartrate once a day as taking.  Her symptom is very rare and stable.  If worsen symptoms I have recommended to correct metoprolol tartrate 25 mg daily to 12.5 mg twice daily.  At baseline heart rate in 50s.  2.  Hypertension -Blood pressure stable on metoprolol  3.  Hyperlipidemia -Managed by PCP.  Continue Lipitor 40 mg daily. Most Recent LDL 96.    Medication Adjustments/Labs and Tests Ordered: Current medicines are reviewed at length with the patient today.  Concerns regarding medicines are outlined above.  No orders of the  defined types were placed in this encounter.  No orders of the defined types were placed in this encounter.   Patient Instructions  Medication Instructions:   Your physician recommends that you continue on your current medications as directed. Please refer to the Current Medication list given to you today.  *If you need a refill on your cardiac medications before your next appointment, please call your pharmacy*   Lab Work: [place]   If you have labs (blood work) drawn today and your tests are completely normal, you will receive your results only by: [place] (if you have MyChart) OR A paper copy in the mail If you have any lab test that is abnormal or we need to change your treatment, we will call you to review the results.   Testing/Procedures: NONE ORDERED  TODAY   Follow-Up: At [facility], you and your health needs are our priority.  As part of our continuing mission to provide you with exceptional heart care, we have created designated Provider Care Teams.  These Care Teams include your primary Cardiologist (physician) and Advanced Practice Providers (APPs -  Physician Assistants and Nurse Practitioners) who all work together to provide you with the care you need, when you need it.  We recommend signing up for the patient portal called "MyChart".  Sign up information is provided on this After Visit Summary.  MyChart is used to connect with patients for Virtual Visits (Telemedicine).  Patients are able to view lab/test results, encounter notes, upcoming appointments, etc.  Non-urgent messages can be sent to your provider as well.   To learn more about what you can do with MyChart, go to [email or web address].    Your next appointment:   1 year(s)  The format for your next appointment:   In Person  Provider:   Jenkins Rouge, MD     Other Instructions    Signed, Leanor Kail, PA  09/16/2021 1:34 PM    [facility]

## 2021-09-16 ENCOUNTER — Other Ambulatory Visit: Payer: Self-pay

## 2021-09-16 ENCOUNTER — Ambulatory Visit (INDEPENDENT_AMBULATORY_CARE_PROVIDER_SITE_OTHER): Payer: PPO | Admitting: Physician Assistant

## 2021-09-16 ENCOUNTER — Encounter: Payer: Self-pay | Admitting: Physician Assistant

## 2021-09-16 VITALS — BP 110/62 | HR 57 | Ht 64.0 in | Wt 148.6 lb

## 2021-09-16 DIAGNOSIS — I10 Essential (primary) hypertension: Secondary | ICD-10-CM | POA: Diagnosis not present

## 2021-09-16 DIAGNOSIS — I700 Atherosclerosis of aorta: Secondary | ICD-10-CM

## 2021-09-16 DIAGNOSIS — E782 Mixed hyperlipidemia: Secondary | ICD-10-CM | POA: Diagnosis not present

## 2021-09-16 DIAGNOSIS — I491 Atrial premature depolarization: Secondary | ICD-10-CM

## 2021-09-16 NOTE — Patient Instructions (Signed)
Medication Instructions:   Your physician recommends that you continue on your current medications as directed. Please refer to the Current Medication list given to you today.  *If you need a refill on your cardiac medications before your next appointment, please call your pharmacy*   Lab Work: [place]   If you have labs (blood work) drawn today and your tests are completely normal, you will receive your results only by: [place] (if you have MyChart) OR A paper copy in the mail If you have any lab test that is abnormal or we need to change your treatment, we will call you to review the results.   Testing/Procedures: NONE ORDERED  TODAY   Follow-Up: At [facility], you and your health needs are our priority.  As part of our continuing mission to provide you with exceptional heart care, we have created designated Provider Care Teams.  These Care Teams include your primary Cardiologist (physician) and Advanced Practice Providers (APPs -  Physician Assistants and Nurse Practitioners) who all work together to provide you with the care you need, when you need it.  We recommend signing up for the patient portal called "MyChart".  Sign up information is provided on this After Visit Summary.  MyChart is used to connect with patients for Virtual Visits (Telemedicine).  Patients are able to view lab/test results, encounter notes, upcoming appointments, etc.  Non-urgent messages can be sent to your provider as well.   To learn more about what you can do with MyChart, go to [email or web address].    Your next appointment:   1 year(s)  The format for your next appointment:   In Person  Provider:   Jenkins Rouge, MD     Other Instructions

## 2021-10-25 ENCOUNTER — Other Ambulatory Visit: Payer: Self-pay | Admitting: Cardiovascular Disease

## 2021-11-22 ENCOUNTER — Ambulatory Visit: Payer: PPO | Admitting: Internal Medicine

## 2021-11-23 ENCOUNTER — Other Ambulatory Visit: Payer: Self-pay

## 2021-11-23 ENCOUNTER — Encounter: Payer: Self-pay | Admitting: Internal Medicine

## 2021-11-23 ENCOUNTER — Ambulatory Visit (INDEPENDENT_AMBULATORY_CARE_PROVIDER_SITE_OTHER): Payer: PPO | Admitting: Internal Medicine

## 2021-11-23 VITALS — BP 114/70 | HR 61 | Temp 97.9°F | Resp 16 | Ht 64.0 in | Wt 144.8 lb

## 2021-11-23 DIAGNOSIS — E782 Mixed hyperlipidemia: Secondary | ICD-10-CM

## 2021-11-23 DIAGNOSIS — I700 Atherosclerosis of aorta: Secondary | ICD-10-CM

## 2021-11-23 DIAGNOSIS — I10 Essential (primary) hypertension: Secondary | ICD-10-CM

## 2021-11-23 DIAGNOSIS — R7309 Other abnormal glucose: Secondary | ICD-10-CM

## 2021-11-23 DIAGNOSIS — Z79899 Other long term (current) drug therapy: Secondary | ICD-10-CM | POA: Diagnosis not present

## 2021-11-23 DIAGNOSIS — E559 Vitamin D deficiency, unspecified: Secondary | ICD-10-CM

## 2021-11-23 NOTE — Progress Notes (Signed)
? ?    ? ?Future Appointments  ?Date Time Provider Department  ?11/23/2021  3:30 PM Unk Pinto, MD [facility]  ?05/13/2022                 Wellness 11:30 AM Magda Bernheim, NP [facility]  ?08/15/2022                CPE  2:00 PM Magda Bernheim, NP [facility]  ? ? ?History of Present Illness: ? ? ?    This very nice 76 y.o. MWF presents for 3 month follow up with HTN, HLD, Pre-Diabetes and Vitamin D Deficiency. Patient is on weekly MTX for Psoriatic Arthritis. ? ? ?    Patient is treated for HTN  circa 1998  & BP has been controlled at home. Today?s BP is at goal - 114/70. Patient has had no complaints of any cardiac type chest pain, palpitations, dyspnea / orthopnea / PND, dizziness, claudication, or dependent edema. ? ? ?    Hyperlipidemia is controlled with diet & Atorvastatin. Patient denies myalgias or other med SE?s. Last Lipids were  at goal : ? ?Lab Results  ?Component Value Date  ? CHOL 193 08/12/2021  ? HDL 76 08/12/2021  ? [place] 96 08/12/2021  ? TRIG 116 08/12/2021  ? CHOLHDL 2.5 08/12/2021  ? ? ? ?Also, the patient has hx/o PreDiabetes (A1c 6.0% /2015) and has had no symptoms of reactive hypoglycemia, diabetic polys, paresthesias or visual blurring.  Last A1c was normal & at goal : ? ?Lab Results  ?Component Value Date  ? HGBA1C 5.4 05/13/2021  ? ?    ? ?Further, the patient also has history of Vitamin D Deficiency and supplements vitamin D without any suspected side-effects. Last vitamin D was at goal : ? ?Lab Results  ?Component Value Date  ? VD25OH 79 08/12/2021  ? ? ? ?Current Outpatient Medications on File Prior to Visit  ?Medication Sig  ? atorvastatin 40 MG tablet TAKE  1 TABLET  3 TIMES PER WEEK   ? B Complex Vitamins  Take 1 tablet every other day.   ? VITAMIN D  5000 u Take 5,000 Units 2  times daily.  ? Magnesium 250 MG TABS Take 250 mg  daily.   ? metoprolol tartrate 25 MG tablet TAKE ONE TABLET  DAILY  ? Sertraline  100 MG tablet Take  1 tablet  Daily  for Mood  ? ? ? ?Allergies  ?Allergen  Reactions  ? Bactrim [Sulfamethoxazole-Trimethoprim] Swelling  ?  Face and eyes swollen and red.  ? Ketek [Telithromycin]   ?  Unknown  ? ? ? ?PMHx:   ?Past Medical History:  ?Diagnosis Date  ? Acquired short Achilles tendon, left   ? Anemia   ? Anxiety   ? Arthritis   ? hands and spine  ? Blood transfusion without reported diagnosis   ? Cervical dysplasia 1976  ? Complication of anesthesia   ? COPD (chronic obstructive pulmonary disease) ([place])   ? Depression   ? Diverticulitis of colon (without mention of hemorrhage)(562.11)   ? Elevated cholesterol   ? GERD (gastroesophageal reflux disease)   ? no meds now  ? Heart murmur   ? Hypertension   ? pt states she takes med for tachycardia NOT HTN -metoprolol taken for tachycardia  ? Internal hemorrhoids without mention of complication   ? Mitral valve disorders(424.0)   ? MRSA infection   ? Osteoporosis   ? spine  ? Palpitations   ?  Pelvic adhesive disease   ? Plaque psoriasis   ? PONV (postoperative nausea and vomiting)   ? Prediabetes   ? Tachycardia   ? never had HTN  ? Vitamin D deficiency   ? ? ? ?Immunization History  ?Administered Date(s) Administered  ? DT  04/27/2015  ? Influenza, High Dose S 07/10/2019, 08/12/2020, 07/19/2021  ? PFIZER SARS-COV-2 Vacc 10/11/2019, 11/03/2019, 06/05/2020  ? Pneumococcal -13 04/27/2015  ? Pneumococcal -23 12/22/2004, 11/24/2016  ? Rabies, IM 05/04/2016, 05/07/2016  ? Td 12/16/2003  ? ? ? ?Past Surgical History:  ?Procedure Laterality Date  ? ABDOMINAL HYSTERECTOMY  2000  ? TAH,BSO endometriosis, leiomyomata  ? CALCANEAL OSTEOTOMY Left 06/25/2020  ? Procedure: Medializing calcaneal osteotomy;  Surgeon: Wylene Simmer, MD;  Location: [place];  Service: Orthopedics;  Laterality: Left;  ? COLONOSCOPY    ? [place]  ? ESOPHAGOGASTRODUODENOSCOPY    ? FINGER SURGERY    ? FOOT ARTHRODESIS Left 06/25/2020  ? Procedure: Talonavicular Arthrodesis;  Surgeon: Wylene Simmer, MD;  Location: [place];  Service: Orthopedics;  Laterality: Left;  ? GANGLION CYST EXCISION Right   ? hand   ? GASTROCNEMIUS RECESSION Left 06/25/2020  ? Procedure: Left gastroc recession;  Surgeon: Wylene Simmer, MD;  Location: [place];  Service: Orthopedics;  Laterality: Left;  ? GYNECOLOGIC CRYOSURGERY  1976  ? HEMORRHOID SURGERY    ? OOPHORECTOMY    ? ROTATOR CUFF REPAIR Left   ? ROTATOR CUFF REPAIR Right 2/15  ? TENOLYSIS Left 06/25/2020  ? Procedure: Left Posterior Tibial Tenolysis;  Surgeon: Wylene Simmer, MD;  Location: [place];  Service: Orthopedics;  Laterality: Left;  ? TYMPANOPLASTY Right   ? UPPER GASTROINTESTINAL ENDOSCOPY    ? ? ?FHx:    Reviewed / unchanged ? ?SHx:    Reviewed / unchanged  ? ?Systems Review: ? ?Constitutional: Denies fever, chills, wt changes, headaches, insomnia, fatigue, night sweats, change in appetite. ?Eyes: Denies redness, blurred vision, diplopia, discharge, itchy, watery eyes.  ?ENT: Denies discharge, congestion, post nasal drip, epistaxis, sore throat, earache, hearing loss, dental pain, tinnitus, vertigo, sinus pain, snoring.  ?CV: Denies chest pain, palpitations, irregular heartbeat, syncope, dyspnea, diaphoresis, orthopnea, PND, claudication or edema. ?Respiratory: denies cough, dyspnea, DOE, pleurisy, hoarseness, laryngitis, wheezing.  ?Gastrointestinal: Denies dysphagia, odynophagia, heartburn, reflux, water brash, abdominal pain or cramps, nausea, vomiting, bloating, diarrhea, constipation, hematemesis, melena, hematochezia  or hemorrhoids. ?Genitourinary: Denies dysuria, frequency, urgency, nocturia, hesitancy, discharge, hematuria or flank pain. ?Musculoskeletal: Denies arthralgias, myalgias, stiffness, jt. swelling, pain, limping or strain/sprain.  ?Skin: Denies pruritus, rash, hives, warts, acne, eczema or change in skin lesion(s). ?Neuro: No weakness, tremor, incoordination, spasms, paresthesia or pain. ?Psychiatric: Denies confusion,  memory loss or sensory loss. ?Endo: Denies change in weight, skin or hair change.  ?Heme/Lymph: No excessive bleeding, bruising or enlarged lymph nodes. ? ?Physical Exam ? ?BP 114/70   Pulse 61   Temp 97.9 ?F (36.6 ?C)   Resp 16   Ht '5\' 4"'$  (1.626 m)   Wt 144 lb 12.8 oz (65.7 kg)   SpO2 97%   BMI 24.85 kg/m?  ? ?Appears  well nourished, well groomed  and in no distress. ? ?Eyes: PERRLA, EOMs, conjunctiva no swelling or erythema. ?Sinuses: No frontal/maxillary tenderness ?ENT/Mouth: EAC's clear, TM's nl w/o erythema, bulging. Nares clear w/o erythema, swelling, exudates. Oropharynx clear without erythema or exudates. Oral hygiene is good. Tongue normal, non obstructing. Hearing intact.  ?Neck:  Supple. Thyroid not palpable. Car 2+/2+ without bruits, nodes or JVD. ?Chest: Respirations nl with BS clear & equal w/o rales, rhonchi, wheezing or stridor.  ?Cor: Heart sounds normal w/ regular rate and rhythm without sig. murmurs, gallops, clicks or rubs. Peripheral pulses normal and equal  without edema.  ?Abdomen: Soft & bowel sounds normal. Non-tender w/o guarding, rebound, hernias, masses or organomegaly.  ?Lymphatics: Unremarkable.  ?Musculoskeletal: Full ROM all peripheral extremities, joint stability, 5/5 strength and normal gait.  ?Skin: Warm, dry without exposed rashes, lesions or ecchymosis apparent.  ?Neuro: Cranial nerves intact, reflexes equal bilaterally. Sensory-motor testing grossly intact. Tendon reflexes grossly intact.  ?Pysch: Alert & oriented x 3.  Insight and judgement nl & appropriate. No ideations. ? ?Assessment and Plan: ? ?1. Essential hypertension ? ?- Continue medication, monitor blood pressure at home.  ?- Continue DASH diet.  Reminder to go to the ER if any CP,  ?SOB, nausea, dizziness, severe HA, changes vision/speech. ?                                                                                                                                                                                                                   ?- CBC with Differential/Platelet ?- COMPLETE METABOLIC PANEL WITH GFR ?- Magnesium ?- TSH ? ?2. Hyperlipidemia, mixed ? ?- Continue diet/meds, exercise,& lifestyle modific

## 2021-11-23 NOTE — Patient Instructions (Signed)

## 2021-11-24 LAB — LIPID PANEL
Cholesterol: 182 mg/dL (ref <200)
HDL: 70 mg/dL (ref >=50)
LDL Cholesterol (Calc): 89 mg/dL (calc)
Non-HDL Cholesterol (Calc): 112 mg/dL (calc) (ref <130)
Total CHOL/HDL Ratio: 2.6 (calc) (ref <5.0)
Triglycerides: 132 mg/dL (ref <150)

## 2021-11-24 LAB — COMPLETE METABOLIC PANEL WITH GFR
AG Ratio: 1.5 (calc) (ref 1.0–2.5)
ALT: 10 U/L (ref 6–29)
AST: 14 U/L (ref 10–35)
Albumin: 4.1 g/dL (ref 3.6–5.1)
Alkaline phosphatase (APISO): 75 U/L (ref 37–153)
BUN: 13 mg/dL (ref 7–25)
CO2: 26 mmol/L (ref 20–32)
Calcium: 9.4 mg/dL (ref 8.6–10.4)
Chloride: 106 mmol/L (ref 98–110)
Creat: 0.86 mg/dL (ref 0.60–1.00)
Globulin: 2.8 g/dL (calc) (ref 1.9–3.7)
Glucose, Bld: 82 mg/dL (ref 65–99)
Potassium: 4.2 mmol/L (ref 3.5–5.3)
Sodium: 138 mmol/L (ref 135–146)
Total Bilirubin: 0.6 mg/dL (ref 0.2–1.2)
Total Protein: 6.9 g/dL (ref 6.1–8.1)
eGFR: 71 mL/min/{1.73_m2} (ref >=60)

## 2021-11-24 LAB — CBC WITH DIFFERENTIAL/PLATELET
Absolute Monocytes: 420 cells/uL (ref 200–950)
Basophils Absolute: 23 cells/uL (ref 0–200)
Basophils Relative: 0.3 %
Eosinophils Absolute: 255 cells/uL (ref 15–500)
Eosinophils Relative: 3.4 %
HCT: 35.2 % (ref 35.0–45.0)
Hemoglobin: 12 g/dL (ref 11.7–15.5)
Lymphs Abs: 2183 cells/uL (ref 850–3900)
MCH: 31.8 pg (ref 27.0–33.0)
MCHC: 34.1 g/dL (ref 32.0–36.0)
MCV: 93.4 fL (ref 80.0–100.0)
MPV: 10 fL (ref 7.5–12.5)
Monocytes Relative: 5.6 %
Neutro Abs: 4620 cells/uL (ref 1500–7800)
Neutrophils Relative %: 61.6 %
Platelets: 254 10*3/uL (ref 140–400)
RBC: 3.77 10*6/uL — ABNORMAL LOW (ref 3.80–5.10)
RDW: 13.2 % (ref 11.0–15.0)
Total Lymphocyte: 29.1 %
WBC: 7.5 10*3/uL (ref 3.8–10.8)

## 2021-11-24 LAB — INSULIN, RANDOM: Insulin: 2.7 u[IU]/mL

## 2021-11-24 LAB — HEMOGLOBIN A1C
Hgb A1c MFr Bld: 5.7 % of total Hgb — ABNORMAL HIGH (ref <5.7)
Mean Plasma Glucose: 117 mg/dL
eAG (mmol/L): 6.5 mmol/L

## 2021-11-24 LAB — VITAMIN D 25 HYDROXY (VIT D DEFICIENCY, FRACTURES): Vit D, 25-Hydroxy: 72 ng/mL (ref 30–100)

## 2021-11-24 LAB — TSH: TSH: 1.51 mIU/L (ref 0.40–4.50)

## 2021-11-24 LAB — MAGNESIUM: Magnesium: 2.1 mg/dL (ref 1.5–2.5)

## 2021-11-24 NOTE — Progress Notes (Signed)
<><><><><><><><><><><><><><><><><><><><><><><><><><><><><><><><><> ?<><><><><><><><><><><><><><><><><><><><><><><><><><><><><><><><><> ?-   Test results slightly outside the reference range are not unusual. ?If there is anything important, I will review this with you,  ?otherwise it is considered normal test values.  ?If you have further questions,  ?please do not hesitate to contact me at the office or via My Chart.  ?<><><><><><><><><><><><><><><><><><><><><><><><><><><><><><><><><> ?<><><><><><><><><><><><><><><><><><><><><><><><><><><><><><><><><> ? ?-  A1c = 5.7 %  - Borderline high normal  (average glucose = 117 mg% )  ? ?- So  . . . . . . . . . ? ?- Avoid Sweets, Candy & White Stuff  ? ?- White Rice, White Potatoes, White Flour  - Breads &  Pasta ?<><><><><><><><><><><><><><><><><><><><><><><><><><><><><><><><><> ?<><><><><><><><><><><><><><><><><><><><><><><><><><><><><><><><><> ? ?-  Total Chol = 182   &  LDL Chol 89   - Both  Excellent  ? ?- Very low risk for Heart Attack  / Stroke ?============================================================ ?============================================================ ? ?-  Vitamin D = 72 - Excellent  - Please  keep dose same  ?<><><><><><><><><><><><><><><><><><><><><><><><><><><><><><><><><> ?<><><><><><><><><><><><><><><><><><><><><><><><><><><><><><><><><> ? ?-  All Else - CBC - Kidneys - Electrolytes - Liver - Magnesium & Thyroid   ? ?- all  Normal / OK ?<><><><><><><><><><><><><><><><><><><><><><><><><><><><><><><><><> ?<><><><><><><><><><><><><><><><><><><><><><><><><><><><><><><><><> ? ? ? ? ? ? ? ? ? ? ? ? ? ? ? ? ? ? ? ? ? ? ? ? ? ? ? ?

## 2022-01-05 ENCOUNTER — Telehealth: Payer: Self-pay | Admitting: Internal Medicine

## 2022-01-05 NOTE — Progress Notes (Signed)
?  Chronic Care Management  ? ?Note ? ?01/05/2022 ?Name: [name] [name] MRN: [id number] DOB: Oct 13, 1945 ? ?[name] [name] is a 76 y.o. year old female who is a primary care patient of Unk Pinto, MD. I reached out to [employer] by phone today in response to a referral sent by [name]'s PCP, Unk Pinto, MD.  ? ?[name] was given information about Chronic Care Management services today including:  ?CCM service includes personalized support from designated clinical staff supervised by her physician, including individualized plan of care and coordination with other care providers ?24/7 contact phone numbers for assistance for urgent and routine care needs. ?Service will only be billed when office clinical staff spend 20 minutes or more in a month to coordinate care. ?Only one practitioner may furnish and bill the service in a calendar month. ?The patient may stop CCM services at any time (effective at the end of the month) by phone call to the office staff. ? ? ?Patient agreed to services and verbal consent obtained.  ? ?Follow up plan:NO COPAY ? ? ?Tatjana Dellinger ?Upstream Scheduler  ?

## 2022-01-15 ENCOUNTER — Other Ambulatory Visit: Payer: Self-pay | Admitting: Internal Medicine

## 2022-01-15 MED ORDER — ATORVASTATIN CALCIUM 40 MG PO TABS: ORAL_TABLET | ORAL | 3 refills | Status: DC

## 2022-02-09 DIAGNOSIS — L4050 Arthropathic psoriasis, unspecified: Secondary | ICD-10-CM | POA: Diagnosis not present

## 2022-02-09 DIAGNOSIS — L409 Psoriasis, unspecified: Secondary | ICD-10-CM | POA: Diagnosis not present

## 2022-02-10 ENCOUNTER — Encounter: Payer: Self-pay | Admitting: Internal Medicine

## 2022-02-22 ENCOUNTER — Telehealth: Payer: Self-pay

## 2022-02-22 ENCOUNTER — Other Ambulatory Visit: Payer: Self-pay

## 2022-02-22 DIAGNOSIS — M25572 Pain in left ankle and joints of left foot: Secondary | ICD-10-CM | POA: Diagnosis not present

## 2022-02-22 DIAGNOSIS — L4059 Other psoriatic arthropathy: Secondary | ICD-10-CM | POA: Diagnosis not present

## 2022-02-22 DIAGNOSIS — Z6824 Body mass index (BMI) 24.0-24.9, adult: Secondary | ICD-10-CM | POA: Diagnosis not present

## 2022-02-22 DIAGNOSIS — L409 Psoriasis, unspecified: Secondary | ICD-10-CM | POA: Diagnosis not present

## 2022-02-22 MED ORDER — SERTRALINE HCL 100 MG PO TABS: ORAL_TABLET | ORAL | 3 refills | Status: DC

## 2022-02-24 NOTE — Telephone Encounter (Signed)
LM-02/24/22-Called pt. To r/s CP visit for 7/25 d/t provider unavailable. Spoke to pt. And scheduled visit for 10/3 at 3:00PM for office visit.  Total time spent: 7 min.

## 2022-03-18 DIAGNOSIS — G609 Hereditary and idiopathic neuropathy, unspecified: Secondary | ICD-10-CM | POA: Diagnosis not present

## 2022-03-18 DIAGNOSIS — M79672 Pain in left foot: Secondary | ICD-10-CM | POA: Diagnosis not present

## 2022-03-22 ENCOUNTER — Telehealth: Payer: PPO | Admitting: Pharmacy Technician

## 2022-04-23 ENCOUNTER — Other Ambulatory Visit: Payer: Self-pay | Admitting: Cardiovascular Disease

## 2022-04-25 DIAGNOSIS — N3946 Mixed incontinence: Secondary | ICD-10-CM | POA: Diagnosis not present

## 2022-04-25 DIAGNOSIS — R3121 Asymptomatic microscopic hematuria: Secondary | ICD-10-CM | POA: Diagnosis not present

## 2022-05-13 ENCOUNTER — Ambulatory Visit: Payer: PPO | Admitting: Nurse Practitioner

## 2022-05-17 DIAGNOSIS — J3489 Other specified disorders of nose and nasal sinuses: Secondary | ICD-10-CM | POA: Diagnosis not present

## 2022-05-24 ENCOUNTER — Telehealth: Payer: Self-pay

## 2022-05-24 NOTE — Telephone Encounter (Signed)
LM-05/24/22-Chart prep started. Reviewing OV, Consults, Hospital visits, Labs, and medication changes. Chart prep complete. Patient data update form complete. Medicare cost analysis performed- Pt. Out of network for [employer].  Total time spent: 35 min.

## 2022-05-24 NOTE — Telephone Encounter (Signed)
LM-05/24/22-Care plan created and complete for CP initial visit on 10/2.  Total time spent: 10 min.

## 2022-05-25 NOTE — Telephone Encounter (Signed)
LM-05/25/22-Called pt. And confirmed CP initial office visit for 10/3 at 3:00PM. Pt. Was instructed to bring all of her medication (bottles) with her to visit. Pt. Has no barriers to receiving her medications and would like to discuss her psoriasis on the bottom of her right foot and her right palm. Pt. Stated she follows up with her dermatologist frequently and is currently using a cream Clobetasol which pt. Reported helps. Informed pt. I will send report to CP and will have her discuss her psoriasis at visit. Pt. Verbalized understanding and agreed. Provided pt. My contact information at [phone] to call if she has any questions/concerns. Pt. Verbalized understanding and agreed.  Total time spent: 11 min.

## 2022-05-26 DIAGNOSIS — H25811 Combined forms of age-related cataract, right eye: Secondary | ICD-10-CM | POA: Diagnosis not present

## 2022-05-27 DIAGNOSIS — R3121 Asymptomatic microscopic hematuria: Secondary | ICD-10-CM | POA: Diagnosis not present

## 2022-05-27 DIAGNOSIS — N3946 Mixed incontinence: Secondary | ICD-10-CM | POA: Diagnosis not present

## 2022-05-30 ENCOUNTER — Telehealth: Payer: Self-pay

## 2022-05-30 NOTE — Progress Notes (Signed)
Medicare Wellness Visit and 3 month OV Assessment:   Encounter for annual medicare wellness visit Schedule mammogram, phone number given Cologuard ordered, declines further colonoscopy DEXA ordered Due annually  Atherosclerosis of aorta Control blood pressure, cholesterol, glucose, increase exercise.   Essential hypertension - continue medications, DASH diet, exercise and monitor at home. Call if greater than 130/80.  - CBC with Differential/Platelet - CMP/GFR  Emphysema Pt has stopped smoking, uses inhaler PRN with flares, rare  Abnormal Glucose Discussed general issues about diabetes pathophysiology and management., Educational material distributed., Suggested low cholesterol diet., Encouraged aerobic exercise., Discussed foot care., Reminded to get yearly retinal exam. Check A1C   Mixed hyperlipidemia Continue statin -continue medications, check lipids, decrease fatty foods, increase activity.  Check lipid  Eustachian tube dysfunction right Use Mucinex daily x 7-10 days If dizziness returns please notify the office   Vitamin D deficiency Continue supplement  Psoriatic arthritis/Cervical arthritis ([place]) Dr. Amil Amen following, now on methotrexate    Diverticulosus of colon  Bowel management; add fiber if not taking   Mitral valve disorder No SOB, CP, edema, monitor  Depression  stress management techniques discussed, increase water, good sleep hygiene discussed, increase exercise, and increase veggies.  - will continue zoloft for now, successfully tapered off of benzo  History of tobacco use Getting annual CT lung cancer screening, 05/29/21 due this month- reorder next Chest CT lung cancer screening and they will call to schedule for her Quit smoking 05/2020  Psoriasis Follows with derm; uses topical cream from dermatology, could not tolerate methotrexate  Flu vaccine need - High dose flu vaccine given   Future Appointments  Date Time Provider [place]  08/15/2022  2:00 PM Alycia Rossetti, NP [facility] None  02/14/2023  3:00 PM Alycia Rossetti, NP [facility] None    Plan:   During the course of the visit the patient was educated and counseled about appropriate screening and preventive services including:   Influenza vaccine Td vaccine Screening electrocardiogram Screening mammography Bone densitometry screening Colorectal cancer screening Diabetes screening Glaucoma screening Nutrition counseling    Subjective:   [name] [name] is a 76 y.o. female who presents for Medicare Annual Wellness Visit and 3 month follow up for HTN, chol, preDM, and depression.   She is on zoloft for depression; successfully tapered off of ativan last year, reports sleeping well. Reports her mood as good, occasional days she feels down.   Last Friday she woke up with horrible vertigo, had chills- she went back to bed and slept for 10 hours.  Resolved within 3 days.   Pt quit smoking 05/2020.  Has emphysema by imaging, asymptomatic, no perceived benefit with inhalers Hx of 30+ pack year history, had CT lung 04/2020 with small nodule recommended for annual screening. Repeat 05/29/21 was negative and suggest repeat in 1 year  Psoriatic arthritis/cervical arthritis, followed by Dr. Amil Amen,. Currently not using any medication  BMI is Body mass index is 24.58 kg/m., she has not been working on diet and exercise; she could walk 20 min walk if the weather was less hot near her home and plans to start daily in another few weeks Wt Readings from Last 3 Encounters:  05/31/22 143 lb 3.2 oz (65 kg)  11/23/21 144 lb 12.8 oz (65.7 kg)  09/16/21 148 lb 9.6 oz (67.4 kg)   She has aortic atherosclerosis per CT 08/2018 Follows with Dr. 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  She denies chest pain, shortness of breath, dizziness.   Pt has noticed some floaters in right eye- followed by Dr. Marica Otter  She is on cholesterol medication (atorvastatin 40 mg three times/ week) and denies myalgias. Her cholesterol is at goal. The cholesterol last visit was:   Lab Results  Component Value Date   CHOL 182 11/23/2021   HDL 70 11/23/2021   LDLCALC 89 11/23/2021   TRIG 132 11/23/2021   CHOLHDL 2.6 11/23/2021   She has been working on diet and exercise for hx of prediabetes recently well controlled, and denies paresthesia of the feet, polydipsia, polyuria and visual disturbances. Last A1C in the office was:  Lab Results  Component Value Date   HGBA1C 5.7 (H) 11/23/2021     She continue to drink lots of water  Lab Results  Component Value Date   EGFR 71 11/23/2021    Patient is on Vitamin D supplement. Lab Results  Component Value Date   VD25OH 72 11/23/2021   Review of Systems  Constitutional:  Negative for chills, fever and weight loss.  HENT:  Negative for congestion and hearing loss.   Eyes:  Negative for blurred vision and double vision.       Floaters in eye followed by Dr. Marica Otter  Respiratory:  Negative for cough and shortness of breath.   Cardiovascular:  Negative for chest pain, palpitations, orthopnea and leg swelling.  Gastrointestinal:  Negative for abdominal pain, constipation, diarrhea, heartburn, nausea and vomiting.  Musculoskeletal:  Negative for falls, joint pain and myalgias.  Skin:  Negative for rash.       Psoriasis patch right hand and foot  Neurological:  Negative for dizziness, tingling, tremors, loss of consciousness and headaches.  Psychiatric/Behavioral:  Negative for depression, memory loss and suicidal ideas.      Medication Review Current Outpatient Medications on File Prior to Visit  Medication Sig Dispense  Refill   atorvastatin (LIPITOR) 40 MG tablet Take 1 tablet 3 x /week for Cholesterol                                           /                              TAKE                             BY                   MOUTH 39 tablet 3   B Complex Vitamins (B COMPLEX PO) Take 1 tablet by mouth every other day.      Cholecalciferol (VITAMIN D3) 5000 UNITS TABS Take 5,000 Units by mouth 2 (two) times daily.     Magnesium 250 MG TABS Take 250 mg by mouth daily.      metoprolol tartrate (LOPRESSOR) 25 MG tablet TAKE ONE TABLET BY MOUTH DAILY 90 tablet 1   mirabegron ER (MYRBETRIQ) 50 MG TB24 tablet Myrbetriq     sertraline (ZOLOFT) 100 MG  tablet Take  1 tablet  Daily  for Mood 90 tablet 3   No current facility-administered medications on file prior to visit.    Current Problems (verified) Patient Active Problem List   Diagnosis Date Noted   Anemia 05/06/2020   Current smoker on some days 04/25/2019   Depression, major, recurrent, in partial remission ([place]) 07/31/2018   Pulmonary emphysema ([place]) 06/13/2017   Aortic atherosclerosis ([place]) by CT Scan in 04/2020 06/13/2017   Psoriatic arthritis ([place]) 05/25/2016   Encounter for Medicare annual wellness exam 04/27/2015   Cervical arthritis ([place]) 10/10/2014   Medication management 10/02/2014   Mixed hyperlipidemia 07/01/2014   Hypertension    Other abnormal glucose    Vitamin D deficiency    Mitral valve disorder 06/16/2009   Diverticulosis of colon 05/06/2008    Screening Tests Immunization History  Administered Date(s) Administered   DT (Pediatric) 04/27/2015   Influenza, High Dose Seasonal PF 07/10/2019, 08/12/2020, 07/19/2021   PFIZER(Purple Top)SARS-COV-2 Vaccination 10/11/2019, 11/03/2019, 06/05/2020   Pneumococcal Conjugate-13 04/27/2015   Pneumococcal Polysaccharide-23 12/22/2004, 11/24/2016   [place], IM 05/04/2016, 05/07/2016   Td 12/16/2003   Preventative care: Last colonoscopy: 12/2016, due 2023 she does not wish to pursue  colonoscopy , willing to do cologuard EGD 02/2015 Last mammogram: 05/07/20 negative repeat 1 year , wants every 2 years- she will schedule this year Last pap smear/pelvic exam: 11/2012 , 1 abnormal in her 20's, declines another DEXA: at OB/GYN-08/2015 normal  Echo 2009 normal EF Ct AB 11/2014 Ct chest: 08/2018, small R nodule, follow up ordered  CXR: 2018 MRI abd: 08/2018 - benign renal cyst   Prior vaccinations: TD or Tdap: 2016 Influenza: 07/2020 Pneumococcal: 2018 Prevnar 13: 2016 Shingles/Zostavax: declines Covid 19: 2/2, 2021, pfizer 1 booster 06/05/20  Names of Other Physician/Practitioners you currently use: 1. [facility]- here for primary care 2. Dr. Sabra Heck, eye doctor, last visit 2023, right catarract scheduled 06/10/22 3. Dr. Rona Ravens, dentist, last visit 2023 4. Dr. Renda Rolls, derm, last visit 2022, goes annually, had appt yesterday 05/17/22  Patient Care Team: Unk Pinto, MD as PCP - General (Internal Medicine) Josue Hector, MD as PCP - Cardiology (Cardiology) Justice Britain, MD as Consulting Physician (Orthopedic Surgery) Iran Planas, MD as Consulting Physician (Orthopedic Surgery) Thornell Sartorius, MD as Consulting Physician (Otolaryngology) Josue Hector, MD as Consulting Physician (Cardiology) Rana Snare, MD (Inactive) as Consulting Physician (Urology) Fontaine, Belinda Block, MD (Inactive) as Consulting Physician (Gynecology) [place], Jennefer Bravo, MD as Referring Physician (Dermatology) Marica Otter, [place] (Optometry) Merrilee Jansky , DDS Carleene Mains, [facility] as Pharmacist (Pharmacist)   Allergies Allergies  Allergen Reactions   Bactrim [Sulfamethoxazole-Trimethoprim] Swelling    Face and eyes swollen and red.   Ketek [Telithromycin]     Unknown    SURGICAL HISTORY She  has a past surgical history that includes Tympanoplasty (Right); Rotator cuff repair (Left); Ectopic pregnancy surgery (1971, 1979); Hemorrhoid  surgery; Oophorectomy; Gynecologic cryosurgery (1976); Abdominal hysterectomy (2000); Rotator cuff repair (Right, 2/15); Colonoscopy; Esophagogastroduodenoscopy; Upper gastrointestinal endoscopy; Ganglion cyst excision (Right); Finger surgery; Gastrocnemius Recession (Left, 06/25/2020); Calcaneal osteotomy (Left, 06/25/2020); Tenolysis (Left, 06/25/2020); and Foot arthrodesis (Left, 06/25/2020). FAMILY HISTORY Her family history includes Cirrhosis in her father; Colon cancer in her maternal grandmother; Colon polyps in her sister; Diabetes in her father and mother; Heart disease in her father, maternal grandfather, paternal grandfather, and paternal grandmother; Hyperlipidemia in her father; Hypertension in her father and mother; Lymphoma in her maternal grandmother; Melanoma in  her mother; Stomach cancer in her maternal grandmother; Uterine cancer in her sister. SOCIAL HISTORY She  reports that she has been smoking cigarettes. She started smoking about 56 years ago. She has a 32.40 pack-year smoking history. She has never used smokeless tobacco. She reports current alcohol use. She reports that she does not use drugs.  MEDICARE WELLNESS OBJECTIVES: Physical activity: Current Exercise Habits: The patient does not participate in regular exercise at present, Exercise limited by: None identified Cardiac risk factors: Cardiac Risk Factors include: advanced age (>5mn, >>2women);dyslipidemia;hypertension;sedentary lifestyle Depression/mood screen:      05/31/2022    4:19 PM  Depression screen PHQ 2/9  Decreased Interest 0  Down, Depressed, Hopeless 0  PHQ - 2 Score 0    ADLs:     05/31/2022    4:21 PM 11/23/2021    1:40 AM  In your present state of health, do you have any difficulty performing the following activities:  Hearing? 0 0  Vision? 0 0  Difficulty concentrating or making decisions? 0 0  Walking or climbing stairs? 0 0  Dressing or bathing? 0 0  Doing errands, shopping? 0 0     Cognitive Testing  Alert? Yes  Normal Appearance?Yes  Oriented to person? Yes  Place? Yes   Time? Yes  Recall of three objects?  Yes  Can perform simple calculations? Yes  Displays appropriate judgment?Yes  Can read the correct time from a watch face?Yes  EOL planning: Does Patient Have a Medical Advance Directive?: Yes Type of Advance Directive: Healthcare Power of Attorney, Living will Does patient want to make changes to medical advance directive?: No - Patient declined Copy of [place] Chart?: No - copy requested   Objective:   Blood pressure 102/70, pulse 68, temperature 97.7 F (36.5 C), height 5' 4"  (1.626 m), weight 143 lb 3.2 oz (65 kg), SpO2 95 %. Body mass index is 24.58 kg/m.  General appearance: alert, no distress, WD/WN,  female HEENT: normocephalic, sclerae anicteric, TMs pearly fluid noted Right TM, nares patent, no discharge or erythema, pharynx normal, right inner nostril at septum with small tender nodule, non fluctuant  Oral cavity: MMM, no lesions Neck: supple, no lymphadenopathy, no thyromegaly, no masses Heart: RRR, bradycardia, no murmurs, clicks, rubs, gallop, normal S1, S2 Lungs: CTA bilaterally, no wheezes, rhonchi, or rales Abdomen: +bs, soft, non tender, no hepatomegaly, no splenomegaly Musculoskeletal: left ankle with mild swelling, heat, tenderness with palpation below malleolus, full ROM without laxity or pain; non-antalgic gait Extremities:  no cyanosis, no clubbing Pulses: 2+ symmetric, upper and lower extremities, normal cap refill Derm: warm, dry; she has plaque to right palmar hand, approx 5 cm area, another to R lateral ankle Neurological: alert, oriented x 3, CN2-12 intact, strength normal upper extremities and lower extremities, sensation normal throughout, DTRs 2+ throughout, no cerebellar signs, gait normal Psychiatric: normal affect, behavior normal, pleasant   Medicare Attestation I have personally  reviewed: The patient's medical and social history Their use of alcohol, tobacco or illicit drugs Their current medications and supplements The patient's functional ability including ADLs,fall risks, home safety risks, cognitive, and hearing and visual impairment Diet and physical activities Evidence for depression or mood disorders  The patient's weight, height, BMI, and visual acuity have been recorded in the chart.  I have made referrals, counseling, and provided education to the patient based on review of the above and I have provided the patient with a written personalized care plan for preventive services.  Magda Bernheim ANP-C  [facility]  05/31/2022

## 2022-05-30 NOTE — Telephone Encounter (Signed)
LM-05/30/22-Called pt. And offered to r/s CP visit d/t her having her AWV tomorrow and pt. Declined. Pt. Informed me she was originally scheduled for her AWV on 9/15 and was rescheduled to 10/3. CP aware. Confirmed time for visit and reminded pt.to bring her medication bottles with her for visit. PT. Verbalized understanding and agreed.  Total time spent: 8 min.

## 2022-05-31 ENCOUNTER — Ambulatory Visit: Payer: PPO | Admitting: Pharmacy Technician

## 2022-05-31 ENCOUNTER — Encounter: Payer: Self-pay | Admitting: Nurse Practitioner

## 2022-05-31 ENCOUNTER — Ambulatory Visit (INDEPENDENT_AMBULATORY_CARE_PROVIDER_SITE_OTHER): Payer: PPO | Admitting: Nurse Practitioner

## 2022-05-31 VITALS — BP 102/70 | HR 68 | Temp 97.7°F | Ht 64.0 in | Wt 143.2 lb

## 2022-05-31 DIAGNOSIS — I10 Essential (primary) hypertension: Secondary | ICD-10-CM

## 2022-05-31 DIAGNOSIS — I700 Atherosclerosis of aorta: Secondary | ICD-10-CM | POA: Diagnosis not present

## 2022-05-31 DIAGNOSIS — E2839 Other primary ovarian failure: Secondary | ICD-10-CM

## 2022-05-31 DIAGNOSIS — L409 Psoriasis, unspecified: Secondary | ICD-10-CM

## 2022-05-31 DIAGNOSIS — M47812 Spondylosis without myelopathy or radiculopathy, cervical region: Secondary | ICD-10-CM

## 2022-05-31 DIAGNOSIS — J439 Emphysema, unspecified: Secondary | ICD-10-CM | POA: Diagnosis not present

## 2022-05-31 DIAGNOSIS — E782 Mixed hyperlipidemia: Secondary | ICD-10-CM

## 2022-05-31 DIAGNOSIS — F3341 Major depressive disorder, recurrent, in partial remission: Secondary | ICD-10-CM

## 2022-05-31 DIAGNOSIS — Z79899 Other long term (current) drug therapy: Secondary | ICD-10-CM | POA: Diagnosis not present

## 2022-05-31 DIAGNOSIS — E559 Vitamin D deficiency, unspecified: Secondary | ICD-10-CM | POA: Diagnosis not present

## 2022-05-31 DIAGNOSIS — Z0001 Encounter for general adult medical examination with abnormal findings: Secondary | ICD-10-CM

## 2022-05-31 DIAGNOSIS — Z0000 Encounter for general adult medical examination without abnormal findings: Secondary | ICD-10-CM

## 2022-05-31 DIAGNOSIS — Z23 Encounter for immunization: Secondary | ICD-10-CM

## 2022-05-31 DIAGNOSIS — R6889 Other general symptoms and signs: Secondary | ICD-10-CM

## 2022-05-31 DIAGNOSIS — H6991 Unspecified Eustachian tube disorder, right ear: Secondary | ICD-10-CM

## 2022-05-31 DIAGNOSIS — K5730 Diverticulosis of large intestine without perforation or abscess without bleeding: Secondary | ICD-10-CM

## 2022-05-31 DIAGNOSIS — R7309 Other abnormal glucose: Secondary | ICD-10-CM | POA: Diagnosis not present

## 2022-05-31 DIAGNOSIS — Z87891 Personal history of nicotine dependence: Secondary | ICD-10-CM | POA: Diagnosis not present

## 2022-05-31 DIAGNOSIS — I059 Rheumatic mitral valve disease, unspecified: Secondary | ICD-10-CM

## 2022-05-31 DIAGNOSIS — F17200 Nicotine dependence, unspecified, uncomplicated: Secondary | ICD-10-CM

## 2022-05-31 DIAGNOSIS — D649 Anemia, unspecified: Secondary | ICD-10-CM

## 2022-05-31 DIAGNOSIS — Z1211 Encounter for screening for malignant neoplasm of colon: Secondary | ICD-10-CM

## 2022-05-31 DIAGNOSIS — L4050 Arthropathic psoriasis, unspecified: Secondary | ICD-10-CM

## 2022-05-31 NOTE — Telephone Encounter (Signed)
LM-05/31/22-Called pt. And informed her we would need to change CP visit today to a phone call visit, d/t CP being ill. Pt. Verbalized understanding and rescheduled to a phone call visit for today at 12:30PM. Placed patient on a brief hold and confirmed visit time was okay. CP aware and okay with visit being moved to  12:30 (phone). Informed pt. CP would reach out around 12:30PM and we will call if anything changes. Pt. Verbalized understanding and agreed.  Total time spent: 10 min.

## 2022-05-31 NOTE — Patient Instructions (Signed)
They will be calling you to schedule CT lung cancer screening AND Bone Density Call to schedule Mammogram Cologuard will be delivered to your house  Use Mucinex daily for 7-10 days   [place]   Know what a healthy weight is for you (roughly BMI <25) and aim to maintain this   Aim for 7+ servings of fruits and vegetables daily   70-80+ fluid ounces of water or unsweet tea for healthy kidneys   Limit to max 1 drink of alcohol per day; avoid smoking/tobacco   Limit animal fats in diet for cholesterol and heart health - choose grass fed whenever available   Avoid highly processed foods, and foods high in saturated/trans fats   Aim for low stress - take time to unwind and care for your mental health   Aim for 150 min of moderate intensity exercise weekly for heart health, and weights twice weekly for bone health   Aim for 7-9 hours of sleep daily

## 2022-06-01 LAB — LIPID PANEL
Cholesterol: 191 mg/dL (ref <200)
HDL: 75 mg/dL (ref >=50)
LDL Cholesterol (Calc): 99 mg/dL (calc)
Non-HDL Cholesterol (Calc): 116 mg/dL (calc) (ref <130)
Total CHOL/HDL Ratio: 2.5 (calc) (ref <5.0)
Triglycerides: 76 mg/dL (ref <150)

## 2022-06-01 LAB — CBC WITH DIFFERENTIAL/PLATELET
Absolute Monocytes: 429 cells/uL (ref 200–950)
Basophils Absolute: 39 cells/uL (ref 0–200)
Basophils Relative: 0.5 %
Eosinophils Absolute: 226 cells/uL (ref 15–500)
Eosinophils Relative: 2.9 %
HCT: 37.6 % (ref 35.0–45.0)
Hemoglobin: 12.7 g/dL (ref 11.7–15.5)
Lymphs Abs: 2356 cells/uL (ref 850–3900)
MCH: 32.2 pg (ref 27.0–33.0)
MCHC: 33.8 g/dL (ref 32.0–36.0)
MCV: 95.4 fL (ref 80.0–100.0)
MPV: 9.7 fL (ref 7.5–12.5)
Monocytes Relative: 5.5 %
Neutro Abs: 4750 cells/uL (ref 1500–7800)
Neutrophils Relative %: 60.9 %
Platelets: 248 10*3/uL (ref 140–400)
RBC: 3.94 10*6/uL (ref 3.80–5.10)
RDW: 12.5 % (ref 11.0–15.0)
Total Lymphocyte: 30.2 %
WBC: 7.8 10*3/uL (ref 3.8–10.8)

## 2022-06-01 LAB — COMPLETE METABOLIC PANEL WITH GFR
AG Ratio: 1.7 (calc) (ref 1.0–2.5)
ALT: 9 U/L (ref 6–29)
AST: 12 U/L (ref 10–35)
Albumin: 4.4 g/dL (ref 3.6–5.1)
Alkaline phosphatase (APISO): 72 U/L (ref 37–153)
BUN: 18 mg/dL (ref 7–25)
CO2: 26 mmol/L (ref 20–32)
Calcium: 9.7 mg/dL (ref 8.6–10.4)
Chloride: 103 mmol/L (ref 98–110)
Creat: 0.94 mg/dL (ref 0.60–1.00)
Globulin: 2.6 g/dL (calc) (ref 1.9–3.7)
Glucose, Bld: 89 mg/dL (ref 65–99)
Potassium: 4.7 mmol/L (ref 3.5–5.3)
Sodium: 138 mmol/L (ref 135–146)
Total Bilirubin: 0.6 mg/dL (ref 0.2–1.2)
Total Protein: 7 g/dL (ref 6.1–8.1)
eGFR: 64 mL/min/{1.73_m2} (ref >=60)

## 2022-06-01 LAB — HEMOGLOBIN A1C
Hgb A1c MFr Bld: 5.6 % of total Hgb (ref <5.7)
Mean Plasma Glucose: 114 mg/dL
eAG (mmol/L): 6.3 mmol/L

## 2022-06-07 NOTE — Telephone Encounter (Signed)
LM-06/07/22-Reviewed pts. chart and it appears pt. has completed cologuard test, and mammo screening is pending from 10/10. Will call pt. next week to assess if she got her mammo.  Total time spent: 3 min.

## 2022-06-10 DIAGNOSIS — H269 Unspecified cataract: Secondary | ICD-10-CM | POA: Diagnosis not present

## 2022-06-10 DIAGNOSIS — H25811 Combined forms of age-related cataract, right eye: Secondary | ICD-10-CM | POA: Diagnosis not present

## 2022-06-13 DIAGNOSIS — Z1211 Encounter for screening for malignant neoplasm of colon: Secondary | ICD-10-CM | POA: Diagnosis not present

## 2022-06-13 NOTE — Telephone Encounter (Signed)
LM-06/13/22-Called pt. To remind her to schedule her yearly mammo and colonoscopy to close current care gaps. Called pts. Mobile and it was no longer in service. Called pts. Other listed contact and was unable to reach. VM box not set up. Will try calling pt. At another time.  Total time spent: 2 min.

## 2022-06-14 NOTE — Telephone Encounter (Signed)
LM-06/14/22-Called pt. To address open care gaps. Pt. Informed me she sent in her cologuard test yesterday, and her mammo is scheduled for 10/23 along with her bone density test. Informed pt. I will let CP know. Pt. Also wanted me to let CP know she had cataract surgery on her left eye on 10/13 and has a f/u w/ her eye doctor this coming Thursday. Pt. Reported she is recovering well, but has not noticed any difference in her vision.  Total time spent: 10 min.

## 2022-06-19 LAB — COLOGUARD: COLOGUARD: NEGATIVE

## 2022-06-20 ENCOUNTER — Encounter: Payer: Self-pay | Admitting: Internal Medicine

## 2022-06-20 DIAGNOSIS — M85852 Other specified disorders of bone density and structure, left thigh: Secondary | ICD-10-CM | POA: Diagnosis not present

## 2022-06-20 DIAGNOSIS — Z1231 Encounter for screening mammogram for malignant neoplasm of breast: Secondary | ICD-10-CM | POA: Diagnosis not present

## 2022-06-20 DIAGNOSIS — Z780 Asymptomatic menopausal state: Secondary | ICD-10-CM | POA: Diagnosis not present

## 2022-06-20 LAB — HM DEXA SCAN: HM Dexa Scan: -1.3

## 2022-06-20 LAB — HM MAMMOGRAPHY

## 2022-06-22 ENCOUNTER — Encounter: Payer: Self-pay | Admitting: Internal Medicine

## 2022-06-24 ENCOUNTER — Ambulatory Visit
Admission: RE | Admit: 2022-06-24 | Discharge: 2022-06-24 | Disposition: A | Discharge status: Home / Self Care | Payer: PPO | Source: Ambulatory Visit | Attending: Nurse Practitioner | Admitting: Nurse Practitioner

## 2022-06-24 DIAGNOSIS — F17210 Nicotine dependence, cigarettes, uncomplicated: Secondary | ICD-10-CM | POA: Diagnosis not present

## 2022-06-24 DIAGNOSIS — Z87891 Personal history of nicotine dependence: Secondary | ICD-10-CM

## 2022-06-27 DIAGNOSIS — H269 Unspecified cataract: Secondary | ICD-10-CM | POA: Diagnosis not present

## 2022-06-27 DIAGNOSIS — H25812 Combined forms of age-related cataract, left eye: Secondary | ICD-10-CM | POA: Diagnosis not present

## 2022-07-05 DIAGNOSIS — H2511 Age-related nuclear cataract, right eye: Secondary | ICD-10-CM | POA: Diagnosis not present

## 2022-07-05 DIAGNOSIS — H20021 Recurrent acute iridocyclitis, right eye: Secondary | ICD-10-CM | POA: Diagnosis not present

## 2022-07-05 DIAGNOSIS — H5203 Hypermetropia, bilateral: Secondary | ICD-10-CM | POA: Diagnosis not present

## 2022-07-05 DIAGNOSIS — Z961 Presence of intraocular lens: Secondary | ICD-10-CM | POA: Diagnosis not present

## 2022-07-05 DIAGNOSIS — Z9842 Cataract extraction status, left eye: Secondary | ICD-10-CM | POA: Diagnosis not present

## 2022-07-05 DIAGNOSIS — H52223 Regular astigmatism, bilateral: Secondary | ICD-10-CM | POA: Diagnosis not present

## 2022-07-05 DIAGNOSIS — H2512 Age-related nuclear cataract, left eye: Secondary | ICD-10-CM | POA: Diagnosis not present

## 2022-07-05 DIAGNOSIS — H524 Presbyopia: Secondary | ICD-10-CM | POA: Diagnosis not present

## 2022-07-20 DIAGNOSIS — H4321 Crystalline deposits in vitreous body, right eye: Secondary | ICD-10-CM | POA: Diagnosis not present

## 2022-07-20 DIAGNOSIS — H35353 Cystoid macular degeneration, bilateral: Secondary | ICD-10-CM | POA: Diagnosis not present

## 2022-08-02 DIAGNOSIS — H4321 Crystalline deposits in vitreous body, right eye: Secondary | ICD-10-CM | POA: Diagnosis not present

## 2022-08-10 DIAGNOSIS — H35353 Cystoid macular degeneration, bilateral: Secondary | ICD-10-CM | POA: Diagnosis not present

## 2022-08-11 NOTE — Progress Notes (Signed)
COMPLETE PHYSICAL  Assessment:   Encounter for routine medical examination with abnormal findings Schedule mammogram, phone number given Due annually   Mixed hyperlipidemia Continue statin -continue medications, check lipids, decrease fatty foods, increase activity.   Tachycardia Taking metoprolol for rate control  Atherosclerosis of aorta Per CT 05/19/20 Control blood pressure, cholesterol, glucose, increase exercise.   Emphysema(HCC) STOP SMOKING, uses inhaler PRN with flares, rare  Current smoker on some days Getting annual CT lung cancer screening, done 05/2022  negative repeat 1 year Smoking cessation- 3-5 min instruction/counseling given, counseled patient on the dangers of tobacco use, advised patient to stop smoking, and reviewed strategies to maximize success, patient not ready to quit at this time.   Hx of prediabetes / abnormal glucose Discussed general issues about diabetes pathophysiology and management., Educational material distributed., Suggested low cholesterol diet., Encouraged aerobic exercise., Discussed foot care., Reminded to get yearly retinal exam.   Vitamin D deficiency Continue supplement  Psoriatic arthritis/Cervical arthritis ([place]) Dr. Amil Amen following, now on methotrexate    Diverticulosus of colon  Bowel management; add fiber if not taking   Mitral valve disorder No SOB, CP, edema, monitor  Osteopenia Continue weight bearing exercises, Vit D and calcium supplementation  Medication management Continued  Estrogen deficiency Dexa in 2023  Depression(HCC)  stress management techniques discussed, increase water, good sleep hygiene discussed, increase exercise, and increase veggies.  - will continue zoloft for now, successfully tapered off of benzo  Anemia CBC  Psoriasis Follows with derm  Overweight - BMI 25 Long discussion about weight loss, diet, and exercise Recommended diet heavy in fruits and veggies and low in animal meats,  cheeses, and dairy products, appropriate calorie intake Patient will work on decrease simple carbs, saturated fats and increase activity Follow up at next visit  Screening for ischemic heart disease - EKG  Screening for AAA - U/S ABD Retroperitoneal LTD  Screening for hematuria/proteinuria - UA routine with reflex culture - Microalbumin/creatinine urine ration  Screening for thyroid disorder - TSH   Continue diet and meds as discussed. Further disposition pending results of labs. Discussed med's effects and SE's.   Over 30 minutes of exam, counseling, chart review, and critical decision making was performed.  Future Appointments  Date Time Provider [place]  02/14/2023  3:00 PM Alycia Rossetti, NP [facility] None  08/16/2023  2:00 PM Alycia Rossetti, NP [facility] None     Subjective:   [name] [name] is a 76 y.o. female who presents for complete physical and follow up for HTN, chol, preDM, and depression.   She is still doing rescue work with water fowl.    She is no longer taking the Myrbetriq and is doing Kegels exercises and the urine leakage is better controlled now.   She is sleeping well and states her mood is good. She is currently on Zoloft 100 mg QD.   She had cataract surgery bilaterally 06/10/22 and 07/02/22, she also had retina surgery for floater in right eye. Vision is much better.   She smokes 1 pack of cigarettes/1-2 weeks - on the weekend with a bottle of wine.  Has emphysema by imaging, asymptomatic, no perceived benefit with inhalers Hx of 30+ pack year history, had CT lung 04/2020 with small nodule recommended for annual screening. Follow up 06/24/22 negative with repeat due in 1 year  Psoriatic arthritis/cervical arthritis, followed by Dr. Amil Amen. Previously used Methotrexate but got nausea and diarrhea so stopped medication. Arthritis is managed at this time.  BMI is Body mass index is 25.48 kg/m., she has not been working on diet  and exercise. She has been eating more sweets and soda, has not been exercising Wt Readings from Last 3 Encounters:  08/15/22 145 lb (65.8 kg)  05/31/22 143 lb 3.2 oz (65 kg)  11/23/21 144 lb 12.8 oz (65.7 kg)   She has aortic atherosclerosis per CT 08/2018 Follows with Dr. 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  She denies chest pain, shortness of breath, dizziness.    She is on cholesterol medication (atorvastatin 40 mg three times/ week) and denies myalgias. Her cholesterol is at goal. The cholesterol last visit was:   Lab Results  Component Value Date   CHOL 191 05/31/2022   HDL 75 05/31/2022   LDLCALC 99 05/31/2022   TRIG 76 05/31/2022   CHOLHDL 2.5 05/31/2022   She has been working on diet and exercise for hx of prediabetes recently well controlled, and denies paresthesia of the feet, polydipsia, polyuria and visual disturbances. Last A1C in the office was:  Lab Results  Component Value Date   HGBA1C 5.6 05/31/2022    Lab Results  Component Value Date   EGFR 64 05/31/2022    Patient is on Vitamin D supplement. Lab Results  Component Value Date   VD25OH 72 11/23/2021   DEXA and mammogram are UTD.  Dexa continues to show osteopenia for which she is doing weight bearing exercises and VIT D and calcium.   Medication Review Current Outpatient Medications on File Prior to Visit  Medication Sig Dispense Refill   Ascorbic Acid (VITAMIN C) 500 MG CAPS Take by mouth.     atorvastatin (LIPITOR) 40 MG tablet Take 1 tablet 3 x /week for Cholesterol                                           /                              TAKE                             BY                   MOUTH 39 tablet 3   B Complex  Vitamins (B COMPLEX PO) Take 1 tablet by mouth every other day.      Cholecalciferol (VITAMIN D3) 5000 UNITS TABS Take 5,000 Units by mouth 2 (two) times daily.     Magnesium 250 MG TABS Take 250 mg by mouth daily.      metoprolol tartrate (LOPRESSOR) 25 MG tablet TAKE ONE TABLET BY MOUTH DAILY 90 tablet 1   mirabegron ER (MYRBETRIQ) 50 MG TB24 tablet Myrbetriq     prednisoLONE acetate (PRED FORTE) 1 % ophthalmic suspension 1 drop 4 (four) times daily.     sertraline (ZOLOFT) 100 MG tablet Take  1 tablet  Daily  for Mood 90 tablet 3   No current facility-administered medications on file  prior to visit.    Current Problems (verified) Patient Active Problem List   Diagnosis Date Noted   Anemia 05/06/2020   Current smoker on some days 04/25/2019   Depression, major, recurrent, in partial remission ([place]) 07/31/2018   Pulmonary emphysema ([place]) 06/13/2017   Aortic atherosclerosis ([place]) by CT Scan in 04/2020 06/13/2017   Psoriatic arthritis ([place]) 05/25/2016   Encounter for Medicare annual wellness exam 04/27/2015   Cervical arthritis ([place]) 10/10/2014   Medication management 10/02/2014   Mixed hyperlipidemia 07/01/2014   Hypertension    Other abnormal glucose    Vitamin D deficiency    Mitral valve disorder 06/16/2009   Diverticulosis of colon 05/06/2008    Screening Tests Immunization History  Administered Date(s) Administered   DT (Pediatric) 04/27/2015   Influenza, High Dose Seasonal PF 07/10/2019, 08/12/2020, 07/19/2021, 05/31/2022   PFIZER(Purple Top)SARS-COV-2 Vaccination 10/11/2019, 11/03/2019, 06/05/2020   Pneumococcal Conjugate-13 04/27/2015   Pneumococcal Polysaccharide-23 12/22/2004, 11/24/2016   [place], IM 05/04/2016, 05/07/2016   Td 12/16/2003   Preventative care: Last colonoscopy: 12/2016, 5 year follow up, 2023 Due EGD 02/2015 Last mammogram:06/20/22  Last pap smear/pelvic exam: 11/2012 , 1 abnormal in her 20's, declines another DEXA:06/20/22 osteopenia Echo 2009  normal EF Ct AB 11/2014 Ct chest: 08/2018, small R nodule, follow up ordered  CXR: 2018 MRI abd: 08/2018 - benign renal cyst   Prior vaccinations: TD or Tdap: 2016 Influenza: 08/05/2021 Pneumococcal: 2018 Prevnar 13: 2016 Shingles/Zostavax: declines Covid 19: 2/2, 2021, pfizer , Booster 06/05/2020  Names of Other Physician/Practitioners you currently use: 1. [facility]- here for primary care 2. Dr. Sabra Heck, eye doctor, 2023 3. Dr. Rona Ravens, dentist, last visit 2023 4. Dr. Renda Rolls, derm, last visit 2022, goes annually  Patient Care Team: Unk Pinto, MD as PCP - General (Internal Medicine) Josue Hector, MD as PCP - Cardiology (Cardiology) Justice Britain, MD as Consulting Physician (Orthopedic Surgery) Iran Planas, MD as Consulting Physician (Orthopedic Surgery) Thornell Sartorius, MD as Consulting Physician (Otolaryngology) Josue Hector, MD as Consulting Physician (Cardiology) Rana Snare, MD (Inactive) as Consulting Physician (Urology) Phineas Real, Belinda Block, MD (Inactive) as Consulting Physician (Gynecology) Renda Rolls, Jennefer Bravo, MD as Referring Physician (Dermatology) Marica Otter, [place] (Optometry) Merrilee Jansky , DDS Carleene Mains, [facility] as Pharmacist (Pharmacist)   Allergies Allergies  Allergen Reactions   Bactrim [Sulfamethoxazole-Trimethoprim] Swelling    Face and eyes swollen and red.   Ketek [Telithromycin]     Unknown    SURGICAL HISTORY She  has a past surgical history that includes Tympanoplasty (Right); Rotator cuff repair (Left); Ectopic pregnancy surgery (1971, 1979); Hemorrhoid surgery; Oophorectomy; Gynecologic cryosurgery (1976); Abdominal hysterectomy (2000); Rotator cuff repair (Right, 2/15); Colonoscopy; Esophagogastroduodenoscopy; Upper gastrointestinal endoscopy; Ganglion cyst excision (Right); Finger surgery; Gastrocnemius Recession (Left, 06/25/2020); Calcaneal osteotomy (Left, 06/25/2020); Tenolysis  (Left, 06/25/2020); and Foot arthrodesis (Left, 06/25/2020). FAMILY HISTORY Her family history includes Cirrhosis in her father; Colon cancer in her maternal grandmother; Colon polyps in her sister; Diabetes in her father and mother; Heart disease in her father, maternal grandfather, paternal grandfather, and paternal grandmother; Hyperlipidemia in her father; Hypertension in her father and mother; Lymphoma in her maternal grandmother; Melanoma in her mother; Stomach cancer in her maternal grandmother; Uterine cancer in her sister. SOCIAL HISTORY She  reports that she has been smoking cigarettes. She started smoking about 57 years ago. She has a 32.40 pack-year smoking history. She has never used smokeless tobacco. She reports current alcohol use. She reports that she does not  use drugs.  Review of Systems  Constitutional:  Negative for chills and fever.  HENT:  Negative for congestion, hearing loss, sinus pain, sore throat and tinnitus.   Eyes:  Negative for blurred vision and double vision.  Respiratory:  Negative for cough, hemoptysis, sputum production, shortness of breath and wheezing.   Cardiovascular:  Negative for chest pain, palpitations and leg swelling.  Gastrointestinal:  Negative for abdominal pain, constipation, diarrhea, heartburn, nausea and vomiting.  Genitourinary:  Positive for frequency (Myrbetriq is helping). Negative for dysuria and urgency.  Musculoskeletal:  Negative for back pain, falls, joint pain, myalgias and neck pain.  Skin:  Negative for rash.  Neurological:  Negative for dizziness, tingling, tremors, weakness and headaches.  Endo/Heme/Allergies:  Does not bruise/bleed easily.  Psychiatric/Behavioral:  Negative for depression and suicidal ideas. The patient is not nervous/anxious and does not have insomnia.     Objective:   Blood pressure 114/60, pulse 70, temperature (!) 97.5 F (36.4 C), height 5' 3.25" (1.607 m), weight 145 lb (65.8 kg), SpO2 95 %. Body mass  index is 25.48 kg/m.  General appearance: alert, no distress, WD/WN,  female HEENT: normocephalic, sclerae anicteric, TMs pearly, nares patent, no discharge or erythema, pharynx normal, right inner nostril at septum with small tender nodule, non fluctuant  Oral cavity: MMM, has a mucocele on roof of mouth , followed by dentist Neck: supple, no lymphadenopathy, no thyromegaly, no masses Heart: RRR, bradycardia, no murmurs, clicks, rubs, gallop, normal S1, S2 Lungs: CTA bilaterally, no wheezes, rhonchi, or rales Abdomen: +bs, soft, non tender, no hepatomegaly, no splenomegaly Musculoskeletal:  full ROM without laxity or pain; non-antalgic gait Extremities:  no cyanosis, no clubbing Pulses: 2+ symmetric, upper and lower extremities, normal cap refill Derm: warm, dry;no rashes or lesions Neurological: alert, oriented x 3, CN2-12 intact, strength normal upper extremities and lower extremities, sensation normal throughout, DTRs 2+ throughout, no cerebellar signs, gait normal Psychiatric: normal affect, behavior normal, pleasant  Pelvic: defer  EKG: NSR, no ST changes AAA: < 3 cm   [facility]  08/15/2022

## 2022-08-15 ENCOUNTER — Ambulatory Visit (INDEPENDENT_AMBULATORY_CARE_PROVIDER_SITE_OTHER): Payer: PPO | Admitting: Nurse Practitioner

## 2022-08-15 ENCOUNTER — Encounter: Payer: Self-pay | Admitting: Nurse Practitioner

## 2022-08-15 VITALS — BP 114/60 | HR 70 | Temp 97.5°F | Ht 63.25 in | Wt 145.0 lb

## 2022-08-15 DIAGNOSIS — Z136 Encounter for screening for cardiovascular disorders: Secondary | ICD-10-CM

## 2022-08-15 DIAGNOSIS — L409 Psoriasis, unspecified: Secondary | ICD-10-CM

## 2022-08-15 DIAGNOSIS — I10 Essential (primary) hypertension: Secondary | ICD-10-CM | POA: Diagnosis not present

## 2022-08-15 DIAGNOSIS — I700 Atherosclerosis of aorta: Secondary | ICD-10-CM

## 2022-08-15 DIAGNOSIS — E782 Mixed hyperlipidemia: Secondary | ICD-10-CM

## 2022-08-15 DIAGNOSIS — E559 Vitamin D deficiency, unspecified: Secondary | ICD-10-CM

## 2022-08-15 DIAGNOSIS — D649 Anemia, unspecified: Secondary | ICD-10-CM

## 2022-08-15 DIAGNOSIS — Z1329 Encounter for screening for other suspected endocrine disorder: Secondary | ICD-10-CM

## 2022-08-15 DIAGNOSIS — Z0000 Encounter for general adult medical examination without abnormal findings: Secondary | ICD-10-CM

## 2022-08-15 DIAGNOSIS — F3341 Major depressive disorder, recurrent, in partial remission: Secondary | ICD-10-CM

## 2022-08-15 DIAGNOSIS — M8580 Other specified disorders of bone density and structure, unspecified site: Secondary | ICD-10-CM

## 2022-08-15 DIAGNOSIS — Z1389 Encounter for screening for other disorder: Secondary | ICD-10-CM | POA: Diagnosis not present

## 2022-08-15 DIAGNOSIS — M47812 Spondylosis without myelopathy or radiculopathy, cervical region: Secondary | ICD-10-CM

## 2022-08-15 DIAGNOSIS — L4050 Arthropathic psoriasis, unspecified: Secondary | ICD-10-CM

## 2022-08-15 DIAGNOSIS — Z0001 Encounter for general adult medical examination with abnormal findings: Secondary | ICD-10-CM

## 2022-08-15 DIAGNOSIS — Z79899 Other long term (current) drug therapy: Secondary | ICD-10-CM | POA: Diagnosis not present

## 2022-08-15 DIAGNOSIS — I059 Rheumatic mitral valve disease, unspecified: Secondary | ICD-10-CM

## 2022-08-15 DIAGNOSIS — F17200 Nicotine dependence, unspecified, uncomplicated: Secondary | ICD-10-CM

## 2022-08-15 DIAGNOSIS — J439 Emphysema, unspecified: Secondary | ICD-10-CM

## 2022-08-15 DIAGNOSIS — R7309 Other abnormal glucose: Secondary | ICD-10-CM | POA: Diagnosis not present

## 2022-08-15 NOTE — Patient Instructions (Signed)

## 2022-08-16 LAB — CBC WITH DIFFERENTIAL/PLATELET
Absolute Monocytes: 479 cells/uL (ref 200–950)
Basophils Absolute: 26 cells/uL (ref 0–200)
Basophils Relative: 0.3 %
Eosinophils Absolute: 157 cells/uL (ref 15–500)
Eosinophils Relative: 1.8 %
HCT: 35.2 % (ref 35.0–45.0)
Hemoglobin: 12.1 g/dL (ref 11.7–15.5)
Lymphs Abs: 2210 cells/uL (ref 850–3900)
MCH: 32.1 pg (ref 27.0–33.0)
MCHC: 34.4 g/dL (ref 32.0–36.0)
MCV: 93.4 fL (ref 80.0–100.0)
MPV: 9.4 fL (ref 7.5–12.5)
Monocytes Relative: 5.5 %
Neutro Abs: 5829 cells/uL (ref 1500–7800)
Neutrophils Relative %: 67 %
Platelets: 256 10*3/uL (ref 140–400)
RBC: 3.77 10*6/uL — ABNORMAL LOW (ref 3.80–5.10)
RDW: 12.9 % (ref 11.0–15.0)
Total Lymphocyte: 25.4 %
WBC: 8.7 10*3/uL (ref 3.8–10.8)

## 2022-08-16 LAB — LIPID PANEL
Cholesterol: 193 mg/dL (ref <200)
HDL: 86 mg/dL (ref >=50)
LDL Cholesterol (Calc): 87 mg/dL (calc)
Non-HDL Cholesterol (Calc): 107 mg/dL (calc) (ref <130)
Total CHOL/HDL Ratio: 2.2 (calc) (ref <5.0)
Triglycerides: 102 mg/dL (ref <150)

## 2022-08-16 LAB — COMPLETE METABOLIC PANEL WITH GFR
AG Ratio: 1.7 (calc) (ref 1.0–2.5)
ALT: 11 U/L (ref 6–29)
AST: 13 U/L (ref 10–35)
Albumin: 4.5 g/dL (ref 3.6–5.1)
Alkaline phosphatase (APISO): 66 U/L (ref 37–153)
BUN: 17 mg/dL (ref 7–25)
CO2: 27 mmol/L (ref 20–32)
Calcium: 9.4 mg/dL (ref 8.6–10.4)
Chloride: 105 mmol/L (ref 98–110)
Creat: 0.86 mg/dL (ref 0.60–1.00)
Globulin: 2.6 g/dL (calc) (ref 1.9–3.7)
Glucose, Bld: 90 mg/dL (ref 65–99)
Potassium: 4.6 mmol/L (ref 3.5–5.3)
Sodium: 141 mmol/L (ref 135–146)
Total Bilirubin: 0.6 mg/dL (ref 0.2–1.2)
Total Protein: 7.1 g/dL (ref 6.1–8.1)
eGFR: 70 mL/min/{1.73_m2} (ref >=60)

## 2022-08-16 LAB — URINALYSIS W MICROSCOPIC + REFLEX CULTURE
Bilirubin Urine: NEGATIVE
Glucose, UA: NEGATIVE
Hyaline Cast: NONE SEEN /LPF
Ketones, ur: NEGATIVE
Leukocyte Esterase: NEGATIVE
Nitrites, Initial: NEGATIVE
Protein, ur: NEGATIVE
Specific Gravity, Urine: 1.01 (ref 1.001–1.035)
Squamous Epithelial / HPF: NONE SEEN /HPF (ref <=5)
pH: 5.5 (ref 5.0–8.0)

## 2022-08-16 LAB — HEMOGLOBIN A1C
Hgb A1c MFr Bld: 5.8 % of total Hgb — ABNORMAL HIGH (ref <5.7)
Mean Plasma Glucose: 120 mg/dL
eAG (mmol/L): 6.6 mmol/L

## 2022-08-16 LAB — VITAMIN D 25 HYDROXY (VIT D DEFICIENCY, FRACTURES): Vit D, 25-Hydroxy: 76 ng/mL (ref 30–100)

## 2022-08-16 LAB — MICROALBUMIN / CREATININE URINE RATIO
Creatinine, Urine: 63 mg/dL (ref 20–275)
Microalb Creat Ratio: 3 mcg/mg creat (ref <30)
Microalb, Ur: 0.2 mg/dL

## 2022-08-16 LAB — MAGNESIUM: Magnesium: 2.1 mg/dL (ref 1.5–2.5)

## 2022-08-16 LAB — TSH: TSH: 1.46 mIU/L (ref 0.40–4.50)

## 2022-08-16 LAB — NO CULTURE INDICATED

## 2022-08-30 DIAGNOSIS — L4059 Other psoriatic arthropathy: Secondary | ICD-10-CM | POA: Diagnosis not present

## 2022-08-30 DIAGNOSIS — E663 Overweight: Secondary | ICD-10-CM | POA: Diagnosis not present

## 2022-08-30 DIAGNOSIS — Z6825 Body mass index (BMI) 25.0-25.9, adult: Secondary | ICD-10-CM | POA: Diagnosis not present

## 2022-08-30 DIAGNOSIS — M25572 Pain in left ankle and joints of left foot: Secondary | ICD-10-CM | POA: Diagnosis not present

## 2022-08-30 DIAGNOSIS — L409 Psoriasis, unspecified: Secondary | ICD-10-CM | POA: Diagnosis not present

## 2022-09-23 DIAGNOSIS — H35353 Cystoid macular degeneration, bilateral: Secondary | ICD-10-CM | POA: Diagnosis not present

## 2022-09-28 ENCOUNTER — Encounter: Payer: Self-pay | Admitting: Cardiovascular Disease

## 2022-09-28 ENCOUNTER — Ambulatory Visit: Payer: PPO | Attending: Cardiovascular Disease | Admitting: Cardiovascular Disease

## 2022-09-28 VITALS — BP 118/80 | HR 60 | Ht 64.0 in | Wt 148.0 lb

## 2022-09-28 DIAGNOSIS — I491 Atrial premature depolarization: Secondary | ICD-10-CM

## 2022-09-28 DIAGNOSIS — I10 Essential (primary) hypertension: Secondary | ICD-10-CM

## 2022-09-28 DIAGNOSIS — E782 Mixed hyperlipidemia: Secondary | ICD-10-CM

## 2022-09-28 NOTE — Progress Notes (Signed)
Cardiology Office Note:    Date:  09/28/2022   ID:  [name] [name], DOB Apr 26, 1946, MRN [id number]  PCP:  Unk Pinto, MD  [facility] Cardiologist:  Jenkins Rouge, MD  [facility] Electrophysiologist:  None   Chief Complaint: Yearly follow-up  History of Present Illness:    [name] [name] is a 77 y.o. female with a hx of hypertension, COPD, hyperlipidemia, PAC, palpitation, psoriasis on methotrexate, prior tobacco smoker and anemia presented for follow-up.  Most recently seen in ER January 2nd 2023 for abdominal pain. She was found to have diverticulitis on CT scan.  Discharged home from ER on antibiotic.  Patient is here for follow-up.  She denies chest pain, shortness of breath, orthopnea, PND, syncope, lower extremity edema or melena.  No regular exercise but goes up and down of stairs at home without any problem.  Rare palpitations/PAC is very stable on metoprolol.  Past Medical History:  Diagnosis Date   Acquired short Achilles tendon, left    Anemia    Anxiety    Arthritis    hands and spine   Blood transfusion without reported diagnosis    Cervical dysplasia 1157   Complication of anesthesia    COPD (chronic obstructive pulmonary disease) (HCC)    Depression    Diverticulitis of colon (without mention of hemorrhage)(562.11)    Diverticulosis of colon (without mention of hemorrhage)    Elevated cholesterol    GERD (gastroesophageal reflux disease)    no meds now   Heart murmur    Hypertension    pt states she takes med for tachycardia NOT HTN -metoprolol taken for tachycardia   Internal hemorrhoids without mention of complication    Mitral valve disorders(424.0)    MRSA infection    Osteoporosis    spine   Palpitations    Pelvic adhesive disease    Plaque psoriasis    PONV (postoperative nausea and vomiting)    Prediabetes    Tachycardia    never had HTN   Vitamin D deficiency     Past Surgical History:  Procedure Laterality Date    ABDOMINAL HYSTERECTOMY  2000   TAH,BSO endometriosis, leiomyomata   CALCANEAL OSTEOTOMY Left 06/25/2020   Procedure: Medializing calcaneal osteotomy;  Surgeon: Wylene Simmer, MD;  Location: [place];  Service: Orthopedics;  Laterality: Left;   COLONOSCOPY     ECTOPIC PREGNANCY SURGERY  1971, 1979   ESOPHAGOGASTRODUODENOSCOPY     FINGER SURGERY     FOOT ARTHRODESIS Left 06/25/2020   Procedure: Talonavicular Arthrodesis;  Surgeon: Wylene Simmer, MD;  Location: [place];  Service: Orthopedics;  Laterality: Left;   GANGLION CYST EXCISION Right    hand    GASTROCNEMIUS RECESSION Left 06/25/2020   Procedure: Left gastroc recession;  Surgeon: Wylene Simmer, MD;  Location: [place];  Service: Orthopedics;  Laterality: Left;   GYNECOLOGIC CRYOSURGERY  1976   HEMORRHOID SURGERY     OOPHORECTOMY     ROTATOR CUFF REPAIR Left    ROTATOR CUFF REPAIR Right 2/15   TENOLYSIS Left 06/25/2020   Procedure: Left Posterior Tibial Tenolysis;  Surgeon: Wylene Simmer, MD;  Location: [place];  Service: Orthopedics;  Laterality: Left;   TYMPANOPLASTY Right    UPPER GASTROINTESTINAL ENDOSCOPY      Current Medications: Current Meds  Medication Sig   Ascorbic Acid (VITAMIN C) 500 MG CAPS Take by mouth.   atorvastatin (LIPITOR) 40 MG tablet Take 1 tablet 3 x /week  for Cholesterol                                           /                              TAKE                             BY                   MOUTH   B Complex Vitamins (B COMPLEX PO) Take 1 tablet by mouth every other day.    Cholecalciferol (VITAMIN D3) 5000 UNITS TABS Take 5,000 Units by mouth 2 (two) times daily.   Magnesium 250 MG TABS Take 250 mg by mouth daily.    metoprolol tartrate (LOPRESSOR) 25 MG tablet TAKE ONE TABLET BY MOUTH DAILY   mirabegron ER (MYRBETRIQ) 50 MG TB24 tablet Myrbetriq   prednisoLONE acetate (PRED FORTE) 1 % ophthalmic suspension 1 drop 4 (four) times  daily.   sertraline (ZOLOFT) 100 MG tablet Take  1 tablet  Daily  for Mood     Allergies:   Bactrim [sulfamethoxazole-trimethoprim] and Ketek [telithromycin]   Social History   Socioeconomic History   Marital status: Married    Spouse name: Not on file   Number of children: 0   Years of education: Not on file   Highest education level: Not on file  Occupational History   Occupation: retired  Tobacco Use   Smoking status: Some Days    Packs/day: 0.60    Years: 54.00    Total pack years: 32.40    Types: Cigarettes    Start date: 45    Last attempt to quit: 06/10/2020    Years since quitting: 2.3   Smokeless tobacco: Never   Tobacco comments:    Smokes when she drinks wine  Vaping Use   Vaping Use: Never used  Substance and Sexual Activity   Alcohol use: Yes    Comment: social   Drug use: No   Sexual activity: Yes    Birth control/protection: Surgical  Other Topics Concern   Not on file  Social History Narrative   She lives with husband.  They do not have children.   She previously worked at [employer].   Highest level of education:  High school    Social Determinants of Health   Financial Resource Strain: Not on file  Food Insecurity: Not on file  Transportation Needs: Not on file  Physical Activity: Not on file  Stress: Not on file  Social Connections: Not on file     Family History: The patient's family history includes Cirrhosis in her father; Colon cancer in her maternal grandmother; Colon polyps in her sister; Diabetes in her father and mother; Heart disease in her father, maternal grandfather, paternal grandfather, and paternal grandmother; Hyperlipidemia in her father; Hypertension in her father and mother; Lymphoma in her maternal grandmother; Melanoma in her mother; Stomach cancer in her maternal grandmother; Uterine cancer in her sister. There is no history of Esophageal cancer, Rectal cancer, Kidney disease, or Pancreatic cancer.    ROS:   Please  see the history of present illness.    All other systems reviewed and are negative.   EKGs/Labs/Other Studies Reviewed:  The following studies were reviewed today: CT CHEST WITHOUT CONTRAST LOW-DOSE FOR LUNG CANCER SCREENING 05/2021 IMPRESSION: 1. Lung-RADS 1, negative. Continue annual screening with low-dose chest CT without contrast in 12 months. 2. Aortic Atherosclerosis (ICD10-I70.0) and Emphysema (ICD10-J43.9).  EKG:  EKG is  not ordered today.    Recent Labs: 08/15/2022: ALT 11; BUN 17; Creat 0.86; Hemoglobin 12.1; Magnesium 2.1; Platelets 256; Potassium 4.6; Sodium 141; TSH 1.46  Recent Lipid Panel    Component Value Date/Time   CHOL 193 08/15/2022 1444   TRIG 102 08/15/2022 1444   HDL 86 08/15/2022 1444   CHOLHDL 2.2 08/15/2022 1444   VLDL 13 03/08/2017 1518   LDLCALC 87 08/15/2022 1444    Physical Exam:    VS:  BP 118/80   Pulse 60   Ht '5\' 4"'$  (1.626 m)   Wt 148 lb (67.1 kg)   BMI 25.40 kg/m     Wt Readings from Last 3 Encounters:  09/28/22 148 lb (67.1 kg)  08/15/22 145 lb (65.8 kg)  05/31/22 143 lb 3.2 oz (65 kg)     Affect appropriate Healthy:  appears stated age HEENT: normal Neck supple with no adenopathy JVP normal no bruits no thyromegaly Lungs clear with no wheezing and good diaphragmatic motion Heart:  S1/S2 no murmur, no rub, gallop or click PMI normal Abdomen: benighn, BS positve, no tenderness, no AAA no bruit.  No HSM or HJR Distal pulses intact with no bruits No edema Neuro non-focal Skin warm and dry No muscular weakness   ASSESSMENT AND PLAN:    PACs/palpitation -Benign no need for monitor  - continue beta blocker  2.  Hypertension -Well controlled.  Continue current medications and low sodium Dash type diet.    3.  Hyperlipidemia -Managed by PCP.  Continue Lipitor 40 mg daily. Most Recent LDL 99 - lung cancer CT with only one vessel coronary calcium   4. Smoking - counseled on cessation lung cancer CT 05/2022  negative     Medication Adjustments/Labs and Tests Ordered: Current medicines are reviewed at length with the patient today.  Concerns regarding medicines are outlined above.  No orders of the defined types were placed in this encounter.  No orders of the defined types were placed in this encounter.   Patient Instructions  Medication Instructions:  Your physician recommends that you continue on your current medications as directed. Please refer to the Current Medication list given to you today.  *If you need a refill on your cardiac medications before your next appointment, please call your pharmacy*  Lab Work: If you have labs (blood work) drawn today and your tests are completely normal, you will receive your results only by: [place] (if you have MyChart) OR A paper copy in the mail If you have any lab test that is abnormal or we need to change your treatment, we will call you to review the results.  Testing/Procedures: None ordered today.  Follow-Up: At [facility], you and your health needs are our priority.  As part of our continuing mission to provide you with exceptional heart care, we have created designated Provider Care Teams.  These Care Teams include your primary Cardiologist (physician) and Advanced Practice Providers (APPs -  Physician Assistants and Nurse Practitioners) who all work together to provide you with the care you need, when you need it.  We recommend signing up for the patient portal called "MyChart".  Sign up information is provided on this After Visit Summary.  MyChart  is used to connect with patients for Virtual Visits (Telemedicine).  Patients are able to view lab/test results, encounter notes, upcoming appointments, etc.  Non-urgent messages can be sent to your provider as well.   To learn more about what you can do with MyChart, go to [email or web address].    Your next appointment:   6 month(s)  Provider:   Jenkins Rouge, MD        Signed, Jenkins Rouge, MD  09/28/2022 9:40 AM    [place]

## 2022-09-28 NOTE — Patient Instructions (Addendum)
Medication Instructions:  Your physician recommends that you continue on your current medications as directed. Please refer to the Current Medication list given to you today.  *If you need a refill on your cardiac medications before your next appointment, please call your pharmacy*  Lab Work: If you have labs (blood work) drawn today and your tests are completely normal, you will receive your results only by: MyChart Message (if you have MyChart) OR A paper copy in the mail If you have any lab test that is abnormal or we need to change your treatment, we will call you to review the results.  Testing/Procedures: None ordered today.  Follow-Up: At [facility], you and your health needs are our priority.  As part of our continuing mission to provide you with exceptional heart care, we have created designated Provider Care Teams.  These Care Teams include your primary Cardiologist (physician) and Advanced Practice Providers (APPs -  Physician Assistants and Nurse Practitioners) who all work together to provide you with the care you need, when you need it.  We recommend signing up for the patient portal called "MyChart".  Sign up information is provided on this After Visit Summary.  MyChart is used to connect with patients for Virtual Visits (Telemedicine).  Patients are able to view lab/test results, encounter notes, upcoming appointments, etc.  Non-urgent messages can be sent to your provider as well.   To learn more about what you can do with MyChart, go to [email or web address].    Your next appointment:   12 month(s)  Provider:   Peter Nishan, MD     

## 2022-10-23 ENCOUNTER — Other Ambulatory Visit: Payer: Self-pay | Admitting: Cardiovascular Disease

## 2022-10-24 NOTE — Telephone Encounter (Signed)
Rx refill sent to pharmacy. 

## 2022-11-23 ENCOUNTER — Telehealth: Payer: Self-pay | Admitting: Cardiovascular Disease

## 2022-11-23 DIAGNOSIS — H35353 Cystoid macular degeneration, bilateral: Secondary | ICD-10-CM | POA: Diagnosis not present

## 2022-11-23 DIAGNOSIS — G453 Amaurosis fugax: Secondary | ICD-10-CM

## 2022-11-23 DIAGNOSIS — D3131 Benign neoplasm of right choroid: Secondary | ICD-10-CM | POA: Diagnosis not present

## 2022-11-23 DIAGNOSIS — H4321 Crystalline deposits in vitreous body, right eye: Secondary | ICD-10-CM | POA: Diagnosis not present

## 2022-11-23 NOTE — Telephone Encounter (Signed)
Pt states she saw her Retina Specialist today, Dr. Stacie Glaze at [facility]. She states she lost vision in her left eye and it didn't come back for a while. She states Dr. Stacie Glaze wants Dr. Johnsie Cancel to order tests below to rule out a stroke. Please advise.   EKG Echo  Carotid doppler CBC, ESR, CRP  "limited stroke w/u as output with Cardiologist"    She states her diagnosis was Amaurosis fugax

## 2022-11-24 NOTE — Telephone Encounter (Signed)
Left message for patient to call back. Scheduled her on the same day as echo for lab work and EKG. When she calls back will see about getting them done sooner.

## 2022-11-24 NOTE — Telephone Encounter (Signed)
Called patient back to let her know Dr. Johnsie Cancel is fine ordering lab work and test. Informed patient that we would get the echo dept to schedule echo first and then schedule lab work and EKG.

## 2022-11-25 NOTE — Telephone Encounter (Signed)
Pt is returning call.  

## 2022-11-28 NOTE — Telephone Encounter (Signed)
Called patient back and left message to call back

## 2022-11-29 NOTE — Telephone Encounter (Signed)
Pt is returning call.  

## 2022-11-29 NOTE — Telephone Encounter (Signed)
Called patient back and made her EKG and lab appointment sooner.

## 2022-12-02 ENCOUNTER — Ambulatory Visit: Payer: PPO | Attending: Cardiovascular Disease | Admitting: Cardiovascular Disease

## 2022-12-02 ENCOUNTER — Ambulatory Visit: Payer: PPO

## 2022-12-02 VITALS — BP 120/62 | HR 63 | Ht 64.0 in | Wt 148.0 lb

## 2022-12-02 DIAGNOSIS — I10 Essential (primary) hypertension: Secondary | ICD-10-CM | POA: Diagnosis not present

## 2022-12-02 DIAGNOSIS — G453 Amaurosis fugax: Secondary | ICD-10-CM

## 2022-12-02 DIAGNOSIS — I700 Atherosclerosis of aorta: Secondary | ICD-10-CM | POA: Diagnosis not present

## 2022-12-02 NOTE — Patient Instructions (Addendum)
Medication Instructions:  Your physician recommends that you continue on your current medications as directed. Please refer to the Current Medication list given to you today.  *If you need a refill on your cardiac medications before your next appointment, please call your pharmacy*  Lab Work: TODAY: CBC, CRP, ESR If you have labs (blood work) drawn today and your tests are completely normal, you will receive your results only by: MyChart Message (if you have MyChart) OR A paper copy in the mail If you have any lab test that is abnormal or we need to change your treatment, we will call you to review the results.  Testing/Procedures: None ordered  Follow-Up: Keep all appointments as scheduled.

## 2022-12-02 NOTE — Progress Notes (Signed)
   Nurse Visit   Date of Encounter: 12/02/2022 ID: [name] [name], DOB 1946/01/19, MRN [id number]  PCP:  Lucky Cowboy, MD   [facility] Providers Cardiologist:  Charlton Haws, MD      Visit Details   VS:  BP 120/62 (BP Location: Left Arm, Patient Position: Sitting, Cuff Size: Normal)   Pulse 63   Ht 5\' 4"  (1.626 m)   Wt 148 lb (67.1 kg)   BMI 25.40 kg/m  , BMI Body mass index is 25.4 kg/m.  Wt Readings from Last 3 Encounters:  12/02/22 148 lb (67.1 kg)  09/28/22 148 lb (67.1 kg)  08/15/22 145 lb (65.8 kg)     Reason for visit: EKG Performed today: Vitals, EKG, and Provider consulted:Dr. Charlton Haws Changes (medications, testing, etc.) : None Length of Visit: 15 minutes  Patient is here today for outpatient cardiac work-up following visit with ophthalmologist Dr. Jenene Slicker to rule out stroke. Patient reported to Dr. Jenene Slicker she lost vision in her left eye and it didn't come back for a while. Patient has had CBC, ESR, and CRP drawn today. She is scheduled for echo on 12/20/22 and carotid doppler 12/07/22. Dr. Charlton Haws (primary cardiologist and DOD) reviewed today's EKG: normal. No changes at this time.  Medications Adjustments/Labs and Tests Ordered: No orders of the defined types were placed in this encounter.  No orders of the defined types were placed in this encounter.   Signed, Franchot Gallo, RN  12/02/2022 11:14 AM

## 2022-12-03 LAB — CBC WITH DIFFERENTIAL/PLATELET
Basophils Absolute: 0 10*3/uL (ref 0.0–0.2)
Basos: 1 %
EOS (ABSOLUTE): 0.2 10*3/uL (ref 0.0–0.4)
Eos: 3 %
Hematocrit: 36.8 % (ref 34.0–46.6)
Hemoglobin: 12.1 g/dL (ref 11.1–15.9)
Immature Grans (Abs): 0 10*3/uL (ref 0.0–0.1)
Immature Granulocytes: 0 %
Lymphocytes Absolute: 1.7 10*3/uL (ref 0.7–3.1)
Lymphs: 28 %
MCH: 31.8 pg (ref 26.6–33.0)
MCHC: 32.9 g/dL (ref 31.5–35.7)
MCV: 97 fL (ref 79–97)
Monocytes Absolute: 0.4 10*3/uL (ref 0.1–0.9)
Monocytes: 6 %
Neutrophils Absolute: 3.8 10*3/uL (ref 1.4–7.0)
Neutrophils: 62 %
Platelets: 238 10*3/uL (ref 150–450)
RBC: 3.8 x10E6/uL (ref 3.77–5.28)
RDW: 12.1 % (ref 11.7–15.4)
WBC: 6.2 10*3/uL (ref 3.4–10.8)

## 2022-12-03 LAB — SEDIMENTATION RATE: Sed Rate: 4 mm/hr (ref 0–40)

## 2022-12-03 LAB — C-REACTIVE PROTEIN: CRP: 2 mg/L (ref 0–10)

## 2022-12-07 ENCOUNTER — Ambulatory Visit (HOSPITAL_COMMUNITY)
Admission: RE | Admit: 2022-12-07 | Discharge: 2022-12-07 | Disposition: A | Discharge status: Home / Self Care | Payer: PPO | Source: Ambulatory Visit | Attending: Cardiovascular Disease | Admitting: Cardiovascular Disease

## 2022-12-07 DIAGNOSIS — G453 Amaurosis fugax: Secondary | ICD-10-CM

## 2022-12-08 ENCOUNTER — Telehealth: Payer: Self-pay | Admitting: Cardiovascular Disease

## 2022-12-08 NOTE — Telephone Encounter (Signed)
Patient stated her eye doctor recommended she have a heart work up.  Patient wants to know why couldn't she have a CAT scan first to determine if she had a stroke before doing additional tests.

## 2022-12-08 NOTE — Telephone Encounter (Signed)
Spoke with patient and she wanted to know why you didn't order a CT scan as part of her work up to see if she really had a stroke before doing all the other test?  Please advise.

## 2022-12-20 ENCOUNTER — Other Ambulatory Visit: Payer: PPO

## 2022-12-20 ENCOUNTER — Ambulatory Visit (HOSPITAL_COMMUNITY): Payer: PPO | Attending: Cardiovascular Disease

## 2022-12-20 DIAGNOSIS — G453 Amaurosis fugax: Secondary | ICD-10-CM | POA: Insufficient documentation

## 2022-12-20 LAB — ECHOCARDIOGRAM COMPLETE
Area-P 1/2: 2.72 cm2
S' Lateral: 2.8 cm

## 2022-12-22 NOTE — Progress Notes (Signed)
Assessment and Plan:  [name] [name] was seen today for referral.  Diagnoses and all orders for this visit:  Amaurosis fugax of left eye Neuro exam normal today Refer to neurology and have MRI scheduled of brain- eye exam and cardiology exam revealed no cause for loss of vision If symptoms return go to er immediately -     MR Brain W Wo Contrast; Future -     Ambulatory referral to Neurology  Essential hypertension - continue medications, DASH diet, exercise and monitor at home. Call if greater than 130/80.    Atherosclerosis of aorta (HCC) Control BP, Weight, cholesterol and blood sugars  Cystoid macular edema of both eyes Continue to follow with eye specialist  Acute right otitis media Mucinex BID for 5-7 days Amoxicillin 500 mg TID x 10 Push fluids -     amoxicillin (AMOXIL) 500 MG tablet; Take 1 tablet (500 mg total) by mouth 3 (three) times daily. 10 days       Further disposition pending results of labs. Discussed med's effects and SE's.   Over 30 minutes of exam, counseling, chart review, and critical decision making was performed.   Future Appointments  Date Time Provider Department Center  02/14/2023  3:00 PM Raynelle Dick, NP [facility] None  08/16/2023  2:00 PM Raynelle Dick, NP [facility] None    ------------------------------------------------------------------------------------------------------------------   HPI BP 106/62   Pulse 62   Temp (!) 97.1 F (36.2 C)   Ht 5\' 4"  (1.626 m)   Wt 149 lb (67.6 kg)   SpO2 95%   BMI 25.58 kg/m   76 y.o.female presents for Visual disturbance. Patient reports that about 5 weeks ago she had  a temporary loss of vision in her left eye for several seconds and then sight returned as square in vision(almost like a kaleidoscope for approximately 10 minutes.   She saw [place] [facility] specialist who recommended work-up for stroke. Recommended CBC , ESR, CRP- all were normal. She is using ketoralac drop and pred forte  drops to eyes once a day for cystoid macular edema of both eyes. She was evaluated by Cardiology and Carotid dopplers were normal. No CT or MRI of head was performed. She is following up for further evaluation and possible neurology referral.   BP is currently controlled with Metoprolol 25 mg QD BP Readings from Last 3 Encounters:  12/23/22 106/62  12/02/22 120/62  09/28/22 118/80    Cholesterol currently controlled with Atorvastatin 40 mg QD Lab Results  Component Value Date   CHOL 193 08/15/2022   HDL 86 08/15/2022   LDLCALC 87 08/15/2022   TRIG 102 08/15/2022   CHOLHDL 2.2 08/15/2022   BMI is Body mass index is 25.58 kg/m.,  Wt Readings from Last 3 Encounters:  12/23/22 149 lb (67.6 kg)  12/02/22 148 lb (67.1 kg)  09/28/22 148 lb (67.1 kg)     Past Medical History:  Diagnosis Date   Acquired short Achilles tendon, left    Anemia    Anxiety    Arthritis    hands and spine   Blood transfusion without reported diagnosis    Cervical dysplasia 1976   Complication of anesthesia    COPD (chronic obstructive pulmonary disease) (HCC)    Depression    Diverticulitis of colon (without mention of hemorrhage)(562.11)    Diverticulosis of colon (without mention of hemorrhage)    Elevated cholesterol    GERD (gastroesophageal reflux disease)    no meds now   Heart murmur  Hypertension    pt states she takes med for tachycardia NOT HTN -metoprolol taken for tachycardia   Internal hemorrhoids without mention of complication    Mitral valve disorders(424.0)    MRSA infection    Osteoporosis    spine   Palpitations    Pelvic adhesive disease    Plaque psoriasis    PONV (postoperative nausea and vomiting)    Prediabetes    Tachycardia    never had HTN   Vitamin D deficiency      Allergies  Allergen Reactions   Bactrim [Sulfamethoxazole-Trimethoprim] Swelling    Face and eyes swollen and red.   Ketek [Telithromycin]     Unknown    Current Outpatient Medications  on File Prior to Visit  Medication Sig   Ascorbic Acid (VITAMIN C) 500 MG CAPS Take by mouth.   atorvastatin (LIPITOR) 40 MG tablet Take 1 tablet 3 x /week for Cholesterol                                           /                              TAKE                             BY                   MOUTH   B Complex Vitamins (B COMPLEX PO) Take 1 tablet by mouth every other day.    Cholecalciferol (VITAMIN D3) 5000 UNITS TABS Take 5,000 Units by mouth 2 (two) times daily.   ketorolac (ACULAR) 0.5 % ophthalmic solution Place 1 drop into the left eye 4 (four) times daily.   Magnesium 250 MG TABS Take 250 mg by mouth daily.    metoprolol tartrate (LOPRESSOR) 25 MG tablet TAKE 1 TABLET BY MOUTH DAILY   mirabegron ER (MYRBETRIQ) 50 MG TB24 tablet Myrbetriq   prednisoLONE acetate (PRED FORTE) 1 % ophthalmic suspension 1 drop 4 (four) times daily.   sertraline (ZOLOFT) 100 MG tablet Take  1 tablet  Daily  for Mood   No current facility-administered medications on file prior to visit.    ROS: all negative except above.   Physical Exam:  BP 106/62   Pulse 62   Temp (!) 97.1 F (36.2 C)   Ht 5\' 4"  (1.626 m)   Wt 149 lb (67.6 kg)   SpO2 95%   BMI 25.58 kg/m   General Appearance: Well nourished, in no apparent distress. Eyes: PERRLA, EOMs, conjunctiva no swelling or erythema Sinuses: No Frontal/maxillary tenderness ENT/Mouth: Ext aud canals clear, R TM erythematous bulging with mucus drainage.  L TM normal. No erythema, swelling, or exudate on post pharynx.  Marland Kitchen Hearing normal.  Neck: Supple, thyroid normal.  Respiratory: Respiratory effort normal, BS equal bilaterally without rales, rhonchi, wheezing or stridor.  Cardio: RRR with no MRGs. Brisk peripheral pulses without edema.  Abdomen: Soft, + BS.  Non tender, no guarding, rebound, hernias, masses. Lymphatics: Non tender without lymphadenopathy.  Musculoskeletal: Full ROM, 5/5 strength, normal gait.  Skin: Warm, dry without rashes,  lesions, ecchymosis.  Neuro: Cranial nerves intact. Normal muscle tone, no cerebellar symptoms. Sensation intact.  Psych: Awake and oriented X 3, normal affect,  Insight and Judgment appropriate.     Raynelle Dick, NP 11:35 AM [facility]

## 2022-12-23 ENCOUNTER — Encounter: Payer: Self-pay | Admitting: Nurse Practitioner

## 2022-12-23 ENCOUNTER — Ambulatory Visit (INDEPENDENT_AMBULATORY_CARE_PROVIDER_SITE_OTHER): Payer: PPO | Admitting: Nurse Practitioner

## 2022-12-23 VITALS — BP 106/62 | HR 62 | Temp 97.1°F | Ht 64.0 in | Wt 149.0 lb

## 2022-12-23 DIAGNOSIS — G453 Amaurosis fugax: Secondary | ICD-10-CM

## 2022-12-23 DIAGNOSIS — I700 Atherosclerosis of aorta: Secondary | ICD-10-CM | POA: Diagnosis not present

## 2022-12-23 DIAGNOSIS — I10 Essential (primary) hypertension: Secondary | ICD-10-CM

## 2022-12-23 DIAGNOSIS — H35353 Cystoid macular degeneration, bilateral: Secondary | ICD-10-CM

## 2022-12-23 DIAGNOSIS — H6691 Otitis media, unspecified, right ear: Secondary | ICD-10-CM

## 2022-12-23 MED ORDER — AMOXICILLIN 500 MG PO TABS
500.0000 mg | ORAL_TABLET | Freq: Three times a day (TID) | ORAL | 0 refills | Status: DC
Start: 2022-12-23 — End: 2023-01-06

## 2022-12-23 NOTE — Patient Instructions (Signed)
Amoxicillin 500 mg TID x 10 Mucinex plain twice a day for 5-7 days  Referring to neurology for further evaluation of vision loss  Ordering MRI will notify once appointment is scheduled  Otitis Media, Adult  Otitis media occurs when there is inflammation and fluid in the middle ear with signs and symptoms of an acute infection. The middle ear is a part of the ear that contains bones for hearing as well as air that helps send sounds to the brain. When infected fluid builds up in this space, it causes pressure and can lead to an ear infection. The eustachian tube connects the middle ear to the back of the nose (nasopharynx) and normally allows air into the middle ear. If the eustachian tube becomes blocked, fluid can build up and become infected. What are the causes? This condition is caused by a blockage in the eustachian tube. This can be caused by mucus or by swelling of the tube. Problems that can cause a blockage include: A cold or other upper respiratory infection. Allergies. An irritant, such as tobacco smoke. Enlarged adenoids. The adenoids are areas of soft tissue located high in the back of the throat, behind the nose and the roof of the mouth. They are part of the body's defense system (immune system). A mass in the nasopharynx. Damage to the ear caused by pressure changes (barotrauma). What increases the risk? You are more likely to develop this condition if you: Smoke or are exposed to tobacco smoke. Have an opening in the roof of your mouth (cleft palate). Have gastroesophageal reflux. Have an immune system disorder. What are the signs or symptoms? Symptoms of this condition include: Ear pain. Fever. Decreased hearing. Tiredness (lethargy). Fluid leaking from the ear, if the eardrum is ruptured or has burst. Ringing in the ear. How is this diagnosed?  This condition is diagnosed with a physical exam. During the exam, your health care provider will use an instrument called  an otoscope to look in your ear and check for redness, swelling, and fluid. He or she will also ask about your symptoms. Your health care provider may also order tests, such as: A pneumatic otoscopy. This is a test to check the movement of the eardrum. It is done by squeezing a small amount of air into the ear. A tympanogram. This is a test that shows how well the eardrum moves in response to air pressure in the ear canal. It provides a graph for your health care provider to review. How is this treated? This condition can go away on its own within 3-5 days. But if the condition is caused by a bacterial infection and does not go away on its own, or if it keeps coming back, your health care provider may: Prescribe antibiotic medicine to treat the infection. Prescribe or recommend medicines to control pain. Follow these instructions at home: Take over-the-counter and prescription medicines only as told by your health care provider. If you were prescribed an antibiotic medicine, take it as told by your health care provider. Do not stop taking the antibiotic even if you start to feel better. Keep all follow-up visits. This is important. Contact a health care provider if: You have bleeding from your nose. There is a lump on your neck. You are not feeling better in 5 days. You feel worse instead of better. Get help right away if: You have severe pain that is not controlled with medicine. You have swelling, redness, or pain around your ear. You have  stiffness in your neck. A part of your face is not moving (paralyzed). The bone behind your ear (mastoid bone) is tender when you touch it. You develop a severe headache. Summary Otitis media is redness, soreness, and swelling of the middle ear, usually resulting in pain and decreased hearing. This condition can go away on its own within 3-5 days. If the problem does not go away in 3-5 days, your health care provider may give you medicines to treat the  infection. If you were prescribed an antibiotic medicine, take it as told by your health care provider. Follow all instructions that were given to you by your health care provider. This information is not intended to replace advice given to you by your health care provider. Make sure you discuss any questions you have with your health care provider. Document Revised: 11/23/2020 Document Reviewed: 11/23/2020 Elsevier Patient Education  2023 [employer].

## 2022-12-28 ENCOUNTER — Telehealth: Payer: Self-pay | Admitting: Nurse Practitioner

## 2022-12-28 NOTE — Telephone Encounter (Signed)
Pt is wanting an update on MRI

## 2022-12-28 NOTE — Telephone Encounter (Signed)
Can you check on scheduling of MRI, I placed the order

## 2022-12-28 NOTE — Addendum Note (Signed)
Addended by: Anda Kraft E on: 12/28/2022 02:04 PM   Modules accepted: Orders

## 2023-01-04 ENCOUNTER — Ambulatory Visit (HOSPITAL_COMMUNITY)
Admission: RE | Admit: 2023-01-04 | Discharge: 2023-01-04 | Disposition: A | Discharge status: Home / Self Care | Payer: PPO | Source: Ambulatory Visit | Attending: Nurse Practitioner | Admitting: Nurse Practitioner

## 2023-01-04 DIAGNOSIS — G453 Amaurosis fugax: Secondary | ICD-10-CM | POA: Insufficient documentation

## 2023-01-04 DIAGNOSIS — G459 Transient cerebral ischemic attack, unspecified: Secondary | ICD-10-CM | POA: Diagnosis not present

## 2023-01-04 MED ORDER — GADOBUTROL 1 MMOL/ML IV SOLN
6.0000 mL | Freq: Once | INTRAVENOUS | Status: AC | PRN
  Administered 2023-01-04: 6 mL via INTRAVENOUS

## 2023-01-06 ENCOUNTER — Ambulatory Visit: Payer: PPO

## 2023-01-06 ENCOUNTER — Encounter: Payer: Self-pay | Admitting: Physician Assistant

## 2023-01-06 ENCOUNTER — Ambulatory Visit: Payer: PPO | Admitting: Physician Assistant

## 2023-01-06 VITALS — BP 125/63 | HR 61 | Ht 64.0 in | Wt 147.0 lb

## 2023-01-06 DIAGNOSIS — G453 Amaurosis fugax: Secondary | ICD-10-CM | POA: Diagnosis not present

## 2023-01-06 NOTE — Progress Notes (Signed)
[facility] Clinic Note - Initial Visit   Date: 01/06/23  [name] MRN: [id number] DOB: 1945-12-29   Dear Dr Lucky Cowboy, MD:  Thank you for your kind referral of [name] [name] for consultation of recent left amaurosis fugax. Although her history is well known to you, please allow [name] [name] to reiterate it for the purpose of our medical record. The patient is here alone   History of Present Illness: [name] [name] is a 77 y.o. R-handed female with history of hypertension, hyperlipidemia, depression, psoriatic arthritis, prior CTS, cervical arthritis, emphysema, tobacco use, anemia, presenting for evaluation of recent brief episode of  vision loss suspicious for amaurosis fugax.  In review, she was in her usual state of health, when on 11/23/22 she called her PCP reporting temporary loss of vision on her left eye, lasting several seconds,  followed by seeing a "kaleidoscope" for about 10 minutes and then completely resolving.  She was seen by ophthalmology, who recommended workup for stroke. He did see "Inflammation and swelling the back of the L eye and gave me steroid drops".   Labs were normal.  Cardiology performed carotid Dopplers with normal results, no stenosis or occlusion. Patient  never had a similar episode. Denies any history of TIA. Denies vertigo dizziness or LOC.  Denies headaches, dysarthria or dysphagia. No confusion or seizures. No taste or blood or metallic taste, no myalgias after the event . Denies any chest pain, or shortness of breath. Denies any fever or chills, or night sweats. Smokes " a few cigarettes, not every day". No hormonal supplements. Does not take a regular ASA a day, or other antiplatelets or anticoagulants. Denies any recent long distance trips or recent surgeries. No sick contacts.  +New stressors present in personal life, because her husband has MS and now hr is having memory issues.   Patient is very active, walking  daily. Not a diabetic. Mother had a stroke "that is how she died" in 2018-01-27  . MRI bran negative for acute findings,    Out-side paper records, electronic medical record, and images have been reviewed where available and summarized as:   Lab Results  Component Value Date   HGBA1C 5.8 (H) 08/15/2022   Lab Results  Component Value Date   VITAMINB12 414 09/03/2019   Lab Results  Component Value Date   TSH 1.46 08/15/2022   Lab Results  Component Value Date   ESRSEDRATE 4 12/02/2022   MRI of the brain, personally reviewed, was negative for acute findings.   Past Medical History:  Diagnosis Date   Acquired short Achilles tendon, left    Anemia    Anxiety    Arthritis    hands and spine   Blood transfusion without reported diagnosis    Cervical dysplasia 1976   Complication of anesthesia    COPD (chronic obstructive pulmonary disease) (HCC)    Depression    Diverticulitis of colon (without mention of hemorrhage)(562.11)    Diverticulosis of colon (without mention of hemorrhage)    Elevated cholesterol    GERD (gastroesophageal reflux disease)    no meds now   Heart murmur    Hypertension    pt states she takes med for tachycardia NOT HTN -metoprolol taken for tachycardia   Internal hemorrhoids without mention of complication    Mitral valve disorders(424.0)    MRSA infection    Osteoporosis    spine   Palpitations    Pelvic adhesive disease    Plaque  psoriasis    PONV (postoperative nausea and vomiting)    Prediabetes    Tachycardia    never had HTN   Vitamin D deficiency     Past Surgical History:  Procedure Laterality Date   ABDOMINAL HYSTERECTOMY  2000   TAH,BSO endometriosis, leiomyomata   CALCANEAL OSTEOTOMY Left 06/25/2020   Procedure: Medializing calcaneal osteotomy;  Surgeon: Toni Arthurs, MD;  Location: [facility];  Service: Orthopedics;  Laterality: Left;   COLONOSCOPY     ECTOPIC PREGNANCY SURGERY  1971, 1979    ESOPHAGOGASTRODUODENOSCOPY     FINGER SURGERY     FOOT ARTHRODESIS Left 06/25/2020   Procedure: Talonavicular Arthrodesis;  Surgeon: Toni Arthurs, MD;  Location: [facility];  Service: Orthopedics;  Laterality: Left;   GANGLION CYST EXCISION Right    hand    GASTROCNEMIUS RECESSION Left 06/25/2020   Procedure: Left gastroc recession;  Surgeon: Toni Arthurs, MD;  Location: [facility];  Service: Orthopedics;  Laterality: Left;   GYNECOLOGIC CRYOSURGERY  1976   HEMORRHOID SURGERY     OOPHORECTOMY     ROTATOR CUFF REPAIR Left    ROTATOR CUFF REPAIR Right 2/15   TENOLYSIS Left 06/25/2020   Procedure: Left Posterior Tibial Tenolysis;  Surgeon: Toni Arthurs, MD;  Location: [facility];  Service: Orthopedics;  Laterality: Left;   TYMPANOPLASTY Right    UPPER GASTROINTESTINAL ENDOSCOPY       Medications:  Outpatient Encounter Medications as of 01/06/2023  Medication Sig   Ascorbic Acid (VITAMIN C) 500 MG CAPS Take by mouth.   atorvastatin (LIPITOR) 40 MG tablet Take 1 tablet 3 x /week for Cholesterol                                           /                              TAKE                             BY                   MOUTH   B Complex Vitamins (B COMPLEX PO) Take 1 tablet by mouth every other day.    Cholecalciferol (VITAMIN D3) 5000 UNITS TABS Take 5,000 Units by mouth 2 (two) times daily.   Magnesium 250 MG TABS Take 250 mg by mouth daily.    metoprolol tartrate (LOPRESSOR) 25 MG tablet TAKE 1 TABLET BY MOUTH DAILY   mirabegron ER (MYRBETRIQ) 50 MG TB24 tablet Myrbetriq   sertraline (ZOLOFT) 100 MG tablet Take  1 tablet  Daily  for Mood   [DISCONTINUED] amoxicillin (AMOXIL) 500 MG tablet Take 1 tablet (500 mg total) by mouth 3 (three) times daily. 10 days   [DISCONTINUED] ketorolac (ACULAR) 0.5 % ophthalmic solution Place 1 drop into the left eye 4 (four) times daily.   [DISCONTINUED] prednisoLONE acetate (PRED FORTE) 1 % ophthalmic  suspension 1 drop 4 (four) times daily.   No facility-administered encounter medications on file as of 01/06/2023.    Allergies:  Allergies  Allergen Reactions   Bactrim [Sulfamethoxazole-Trimethoprim] Swelling    Face and eyes swollen and red.   Ketek [Telithromycin]     Unknown  Family History: Family History  Problem Relation Age of Onset   Diabetes Mother    Hypertension Mother    Melanoma Mother    Diabetes Father    Heart disease Father    Hyperlipidemia Father    Hypertension Father    Cirrhosis Father        alcoholic   Uterine cancer Sister        UTERINE   Colon cancer Maternal Grandmother        dx in her 58's ?mets from lymphoma   Stomach cancer Maternal Grandmother    Lymphoma Maternal Grandmother    Heart disease Maternal Grandfather    Heart disease Paternal Grandmother    Heart disease Paternal Grandfather    Colon polyps Sister    Esophageal cancer Neg Hx    Rectal cancer Neg Hx    Kidney disease Neg Hx    Pancreatic cancer Neg Hx     Social History: Social History   Tobacco Use   Smoking status: Some Days    Packs/day: 0.60    Years: 54.00    Additional pack years: 0.00    Total pack years: 32.40    Types: Cigarettes    Start date: 63    Last attempt to quit: 06/10/2020    Years since quitting: 2.5   Smokeless tobacco: Never   Tobacco comments:    Smokes when she drinks wine  Vaping Use   Vaping Use: Never used  Substance Use Topics   Alcohol use: Yes    Comment: social   Drug use: No   Social History   Social History Narrative   She lives with husband.  They do not have children.   She previously worked at [employer].   Highest level of education:  High school       Right Handed    Lives in a two story home    Vital Signs:  BP 125/63   Pulse 61   Ht 5\' 4"  (1.626 m)   Wt 147 lb (66.7 kg)   SpO2 93%   BMI 25.23 kg/m    General Medical Exam:   General:  Well appearing, comfortable.   Eyes/ENT: see cranial  nerve examination.   Neck:   No carotid bruits. Respiratory:  Clear to auscultation, good air entry bilaterally.   Cardiac:  Regular rate and rhythm, no murmur.   Extremities:  No deformities, edema, or skin discoloration.  Skin:  No rashes or lesions.  Neurological Exam: MENTAL STATUS including orientation to time, place, person, recent and remote memory, attention span and concentration, language, and fund of knowledge is normal.  Speech is not dysarthric.  CRANIAL NERVES: II:  No visual field defects.  Unremarkable fundi.   III-IV-VI: Pupils equal round and reactive to light.  Normal conjugate, extra-ocular eye movements in all directions of gaze.  No nystagmus.  No ptosis .   V:  Normal facial sensation.    VII:  Normal facial symmetry and movements.   VIII:  Normal hearing and vestibular function.   IX-X:  Normal palatal movement.   XI:  Normal shoulder shrug and head rotation.   XII:  Normal tongue strength and range of motion, no deviation or fasciculation.  MOTOR:  No atrophy, fasciculations or abnormal movements.  No pronator drift.    SENSORY:  Normal and symmetric perception of light touch, pinprick, vibration, and proprioception.  Romberg's sign absent.   COORDINATION/GAIT: Normal finger-to- nose-finger and heel-to-shin.  Intact rapid alternating movements  bilaterally.  Able to rise from a chair without using arms.  Gait narrow based and stable. Tandem and stressed gait intact.      Amaurosis fugax, unclear etiology     Return to clinic in 3 months. Recommend good control of cardiovascular risk factors.   Discontinue tobacco use, counseling given  Recommend baby ASA daily   Total time spent:  51 mins   Thank you for allowing me to participate in patient's care.  If I can answer any additional questions, I would be pleased to do so.    Sincerely,   Marlowe Kays, PA-C

## 2023-01-06 NOTE — Patient Instructions (Signed)
Follow up in 3 months  Baby aspirin daily

## 2023-02-02 ENCOUNTER — Ambulatory Visit (INDEPENDENT_AMBULATORY_CARE_PROVIDER_SITE_OTHER): Payer: PPO | Admitting: Nurse Practitioner

## 2023-02-02 ENCOUNTER — Telehealth: Payer: Self-pay | Admitting: Nurse Practitioner

## 2023-02-02 ENCOUNTER — Encounter: Payer: Self-pay | Admitting: Nurse Practitioner

## 2023-02-02 VITALS — BP 112/72 | HR 67 | Temp 97.4°F | Ht 64.0 in | Wt 147.6 lb

## 2023-02-02 DIAGNOSIS — R2689 Other abnormalities of gait and mobility: Secondary | ICD-10-CM | POA: Diagnosis not present

## 2023-02-02 DIAGNOSIS — R42 Dizziness and giddiness: Secondary | ICD-10-CM | POA: Diagnosis not present

## 2023-02-02 DIAGNOSIS — H6691 Otitis media, unspecified, right ear: Secondary | ICD-10-CM

## 2023-02-02 DIAGNOSIS — H6991 Unspecified Eustachian tube disorder, right ear: Secondary | ICD-10-CM | POA: Diagnosis not present

## 2023-02-02 DIAGNOSIS — R519 Headache, unspecified: Secondary | ICD-10-CM

## 2023-02-02 DIAGNOSIS — R110 Nausea: Secondary | ICD-10-CM | POA: Diagnosis not present

## 2023-02-02 MED ORDER — MECLIZINE HCL 25 MG PO TABS
ORAL_TABLET | ORAL | 0 refills | Status: AC
Start: 2023-02-02 — End: ?

## 2023-02-02 MED ORDER — AMOXICILLIN 500 MG PO TABS
500.0000 mg | ORAL_TABLET | Freq: Three times a day (TID) | ORAL | 0 refills | Status: DC
Start: 2023-02-02 — End: 2023-03-01

## 2023-02-02 NOTE — Progress Notes (Signed)
Assessment and Plan:  [name] [name] was seen today for an episodic visit.  Diagnoses and all order for this visit:  1. Vertigo Report to ER for any increase in worsening symptoms including fever, chills, N/V, HA, unable to stand or walk.  - CT HEAD WO CONTRAST ( ); Future - meclizine (ANTIVERT) 25 MG tablet; 1/2-1 pill up to 3 times daily for motion sickness/dizziness  Dispense: 30 tablet; Refill: 0  2. Nausea Meclizine PRN  - CT HEAD WO CONTRAST ( ); Future  3. Loss of balance Discussed importance of contact guard/use of assistive device.  - CT HEAD WO CONTRAST ( ); Future - meclizine (ANTIVERT) 25 MG tablet; 1/2-1 pill up to 3 times daily for motion sickness/dizziness  Dispense: 30 tablet; Refill: 0  4. Right-sided headache Report to ER for any worsening symptoms.  - CT HEAD WO CONTRAST ( ); Future  5. Acute right otitis media/Eustachian tube dysfunction, right Start tmt with abx. Continue to monitor  - amoxicillin (AMOXIL) 500 MG tablet; Take 1 tablet (500 mg total) by mouth 3 (three) times daily. 10 days  Dispense: 30 tablet; Refill: 0  Notify office for further evaluation and treatment, questions or concerns if s/s fail to improve. The risks and benefits of my recommendations, as well as other treatment options were discussed with the patient today. Questions were answered.  Further disposition pending results of labs. Discussed med's effects and SE's.    Over 25 minutes of exam, counseling, chart review, and critical decision making was performed.   Future Appointments  Date Time Provider Department Center  02/14/2023  3:00 PM Raynelle Dick, NP [facility] None  04/10/2023  2:30 PM Marcos Eke, PA-C [facility] None  08/16/2023  2:00 PM Raynelle Dick, NP [facility] None    ------------------------------------------------------------------------------------------------------------------   HPI BP 112/72   Pulse 67   Temp (!) 97.4 F (36.3  C)   Ht 5\' 4"  (1.626 m)   Wt 147 lb 9.6 oz (67 kg)   SpO2 99%   BMI 25.34 kg/m   77 y.o.female presents for evaluation of vertigo type symptoms, dizziness, left sided HA, nausea.  States that the symptoms are identical to the symptoms she had in 1999 when she had a head CT that revealed air/fluid level in the right sphenoid sinus.  States that she was treated with abx and has not had any issues since 3 days ago.  Today symptoms are worse, feeling as though she is spinning, not the room.  She has not trouble turing head from right to left but does have increase in nausea dn dizziness during positional changes.  Denies otalgia, fever, chills.    Was seen if office on 12/23/22 and treated for right otitis media.  Completed course of Amoxicillin.  She had a brain MRI scheduled to assess for amaurosis fugax of left eye and follows with Neurology.  Hx notes right eustachian tube dysfunction.  Past Medical History:  Diagnosis Date   Acquired short Achilles tendon, left    Anemia    Anxiety    Arthritis    hands and spine   Blood transfusion without reported diagnosis    Cervical dysplasia 1976   Complication of anesthesia    COPD (chronic obstructive pulmonary disease) (HCC)    Depression    Diverticulitis of colon (without mention of hemorrhage)(562.11)    Diverticulosis of colon (without mention of hemorrhage)    Elevated cholesterol    GERD (gastroesophageal reflux disease)    no meds now  Heart murmur    Hypertension    pt states she takes med for tachycardia NOT HTN -metoprolol taken for tachycardia   Internal hemorrhoids without mention of complication    Mitral valve disorders(424.0)    MRSA infection    Osteoporosis    spine   Palpitations    Pelvic adhesive disease    Plaque psoriasis    PONV (postoperative nausea and vomiting)    Prediabetes    Tachycardia    never had HTN   Vitamin D deficiency      Allergies  Allergen Reactions   Bactrim  [Sulfamethoxazole-Trimethoprim] Swelling    Face and eyes swollen and red.   Ketek [Telithromycin]     Unknown    Current Outpatient Medications on File Prior to Visit  Medication Sig   Ascorbic Acid (VITAMIN C) 500 MG CAPS Take by mouth.   atorvastatin (LIPITOR) 40 MG tablet Take 1 tablet 3 x /week for Cholesterol                                           /                              TAKE                             BY                   MOUTH   B Complex Vitamins (B COMPLEX PO) Take 1 tablet by mouth every other day.    Cholecalciferol (VITAMIN D3) 5000 UNITS TABS Take 5,000 Units by mouth 2 (two) times daily.   Magnesium 250 MG TABS Take 250 mg by mouth daily.    metoprolol tartrate (LOPRESSOR) 25 MG tablet TAKE 1 TABLET BY MOUTH DAILY   mirabegron ER (MYRBETRIQ) 50 MG TB24 tablet Myrbetriq   sertraline (ZOLOFT) 100 MG tablet Take  1 tablet  Daily  for Mood   No current facility-administered medications on file prior to visit.    ROS: all negative except what is noted in the HPI.   Physical Exam:  BP 112/72   Pulse 67   Temp (!) 97.4 F (36.3 C)   Ht 5\' 4"  (1.626 m)   Wt 147 lb 9.6 oz (67 kg)   SpO2 99%   BMI 25.34 kg/m   General Appearance: NAD.  Awake, conversant and cooperative. Eyes: PERRLA, EOMs intact.  Sclera white.  Conjunctiva without erythema. Sinuses: No frontal/maxillary tenderness.  No nasal discharge. Nares patent.  ENT/Mouth: Ext aud canals clear.  Bilateral TMs w/DOL and without erythema or bulging. Right ear mild effusion Hearing intact.  Posterior pharynx without swelling or exudate.  Tonsils without swelling or erythema.  Neck: Supple.  No masses, nodules or thyromegaly. Respiratory: Effort is regular with non-labored breathing. Breath sounds are equal bilaterally without rales, rhonchi, wheezing or stridor.  Cardio: RRR with no MRGs. Brisk peripheral pulses without edema.  Abdomen: Active BS in all four quadrants.  Soft and non-tender without guarding,  rebound tenderness, hernias or masses. Lymphatics: Non tender without lymphadenopathy.  Musculoskeletal: Full ROM, 5/5 strength, normal ambulation.  No clubbing or cyanosis. Skin: Appropriate color for ethnicity. Warm without rashes, lesions, ecchymosis, ulcers.  Neuro: CN II-XII grossly normal. Normal muscle  tone without cerebellar symptoms and intact sensation.   Psych: AO X 3,  appropriate mood and affect, insight and judgment.     Adela Glimpse, NP 1:56 PM [facility]

## 2023-02-02 NOTE — Patient Instructions (Signed)
Dizziness Dizziness is a common problem. It makes you feel unsteady or light-headed. You may feel like you are about to pass out (faint). Dizziness can lead to getting hurt if you stumble or fall. Dizziness can be caused by many things, including: Medicines. Not having enough water in your body (dehydration). Illness. Follow these instructions at home: Eating and drinking  Drink enough fluid to keep your pee (urine) pale yellow. This helps to keep you from getting dehydrated. Try to drink more clear fluids, such as water. Do not drink alcohol. Limit how much caffeine you drink or eat, if your doctor tells you to do that. Limit how much salt (sodium) you drink or eat, if your doctor tells you to do that. Activity  Avoid making quick movements. Stand up slowly from sitting in a chair, and steady yourself until you feel okay. In the morning, first sit up on the side of the bed. When you feel okay, stand up slowly while you hold onto something. Do this until you know that your balance is okay. If you need to stand in one place for a long time, move your legs often. Tighten and relax the muscles in your legs while you are standing. Do not drive or use machinery if you feel dizzy. Avoid bending down if you feel dizzy. Place items in your home so you can reach them easily without leaning over. Lifestyle Do not smoke or use any products that contain nicotine or tobacco. If you need help quitting, ask your doctor. Try to lower your stress level. You can do this by using methods such as yoga or meditation. Talk with your doctor if you need help. General instructions Watch your dizziness for any changes. Take over-the-counter and prescription medicines only as told by your doctor. Talk with your doctor if you think that you are dizzy because of a medicine that you are taking. Tell a friend or a family member that you are feeling dizzy. If he or she notices any changes in your behavior, have this  person call your doctor. Keep all follow-up visits. Contact a doctor if: Your dizziness does not go away. Your dizziness or light-headedness gets worse. You feel like you may vomit (are nauseous). You have trouble hearing. You have new symptoms. You are unsteady on your feet. You feel like the room is spinning. You have neck pain or a stiff neck. You have a fever. Get help right away if: You vomit or have watery poop (diarrhea), and you cannot eat or drink anything. You have trouble: Talking. Walking. Swallowing. Using your arms, hands, or legs. You feel generally weak. You are not thinking clearly, or you have trouble forming sentences. A friend or family member may notice this. You have: Chest pain. Pain in your belly (abdomen). Shortness of breath. Sweating. Your vision changes. You are bleeding. You have a very bad headache. These symptoms may be an emergency. Get help right away. Call your local emergency services (911 in the [place] Do not wait to see if the symptoms will go away. Do not drive yourself to the hospital. Summary Dizziness makes you feel unsteady or light-headed. You may feel like you are about to pass out (faint). Drink enough fluid to keep your pee (urine) pale yellow. Do not drink alcohol. Avoid making quick movements if you feel dizzy. Watch your dizziness for any changes. This information is not intended to replace advice given to you by your health care provider. Make sure you discuss any questions   you have with your health care provider. Document Revised: 07/17/2020 Document Reviewed: 07/20/2020 Elsevier Patient Education  2024 [employer]vier Inc.  

## 2023-02-02 NOTE — Telephone Encounter (Signed)
[facility] but couldn't get her in to be scanned until 02/17/2023. Is there a way to get it done sooner?

## 2023-02-03 NOTE — Telephone Encounter (Signed)
Patient is now scheduled at [facility] for June 15th. [facility] cancelled.

## 2023-02-04 ENCOUNTER — Emergency Department (HOSPITAL_BASED_OUTPATIENT_CLINIC_OR_DEPARTMENT_OTHER): Payer: PPO

## 2023-02-04 ENCOUNTER — Emergency Department (HOSPITAL_BASED_OUTPATIENT_CLINIC_OR_DEPARTMENT_OTHER)
Admission: EM | Admit: 2023-02-04 | Discharge: 2023-02-04 | Disposition: A | Discharge status: Other Acute Inpatient Hospital | Payer: PPO | Attending: Emergency Medicine | Admitting: Emergency Medicine

## 2023-02-04 ENCOUNTER — Emergency Department (HOSPITAL_COMMUNITY): Payer: PPO

## 2023-02-04 DIAGNOSIS — R110 Nausea: Secondary | ICD-10-CM | POA: Diagnosis not present

## 2023-02-04 DIAGNOSIS — I10 Essential (primary) hypertension: Secondary | ICD-10-CM | POA: Diagnosis not present

## 2023-02-04 DIAGNOSIS — J449 Chronic obstructive pulmonary disease, unspecified: Secondary | ICD-10-CM | POA: Insufficient documentation

## 2023-02-04 DIAGNOSIS — H6641 Suppurative otitis media, unspecified, right ear: Secondary | ICD-10-CM | POA: Insufficient documentation

## 2023-02-04 DIAGNOSIS — H8190 Unspecified disorder of vestibular function, unspecified ear: Secondary | ICD-10-CM

## 2023-02-04 DIAGNOSIS — Z79899 Other long term (current) drug therapy: Secondary | ICD-10-CM | POA: Diagnosis not present

## 2023-02-04 DIAGNOSIS — H8191 Unspecified disorder of vestibular function, right ear: Secondary | ICD-10-CM | POA: Insufficient documentation

## 2023-02-04 DIAGNOSIS — R42 Dizziness and giddiness: Secondary | ICD-10-CM | POA: Diagnosis not present

## 2023-02-04 DIAGNOSIS — Z7951 Long term (current) use of inhaled steroids: Secondary | ICD-10-CM | POA: Insufficient documentation

## 2023-02-04 DIAGNOSIS — R29818 Other symptoms and signs involving the nervous system: Secondary | ICD-10-CM | POA: Diagnosis not present

## 2023-02-04 DIAGNOSIS — R519 Headache, unspecified: Secondary | ICD-10-CM | POA: Diagnosis present

## 2023-02-04 LAB — BASIC METABOLIC PANEL
Anion gap: 8 (ref 5–15)
BUN: 17 mg/dL (ref 8–23)
CO2: 24 mmol/L (ref 22–32)
Calcium: 9.7 mg/dL (ref 8.9–10.3)
Chloride: 105 mmol/L (ref 98–111)
Creatinine, Ser: 0.78 mg/dL (ref 0.44–1.00)
GFR, Estimated: >60 mL/min (ref >60)
Glucose, Bld: 95 mg/dL (ref 70–99)
Potassium: 4.6 mmol/L (ref 3.5–5.1)
Sodium: 137 mmol/L (ref 135–145)

## 2023-02-04 LAB — CBC WITH DIFFERENTIAL/PLATELET
Abs Immature Granulocytes: 0.02 10*3/uL (ref 0.00–0.07)
Basophils Absolute: 0 10*3/uL (ref 0.0–0.1)
Basophils Relative: 0 %
Eosinophils Absolute: 0.2 10*3/uL (ref 0.0–0.5)
Eosinophils Relative: 2 %
HCT: 37.5 % (ref 36.0–46.0)
Hemoglobin: 12.8 g/dL (ref 12.0–15.0)
Immature Granulocytes: 0 %
Lymphocytes Relative: 25 %
Lymphs Abs: 1.7 10*3/uL (ref 0.7–4.0)
MCH: 32.5 pg (ref 26.0–34.0)
MCHC: 34.1 g/dL (ref 30.0–36.0)
MCV: 95.2 fL (ref 80.0–100.0)
Monocytes Absolute: 0.4 10*3/uL (ref 0.1–1.0)
Monocytes Relative: 6 %
Neutro Abs: 4.6 10*3/uL (ref 1.7–7.7)
Neutrophils Relative %: 67 %
Platelets: 234 10*3/uL (ref 150–400)
RBC: 3.94 MIL/uL (ref 3.87–5.11)
RDW: 13 % (ref 11.5–15.5)
WBC: 6.9 10*3/uL (ref 4.0–10.5)
nRBC: 0 % (ref 0.0–0.2)

## 2023-02-04 MED ORDER — MECLIZINE HCL 12.5 MG PO TABS: 12.5000 mg | ORAL_TABLET | Freq: Three times a day (TID) | ORAL | 0 refills | Status: AC

## 2023-02-04 MED ORDER — DIAZEPAM 5 MG/ML IJ SOLN
1.2500 mg | Freq: Once | INTRAMUSCULAR | Status: AC
  Administered 2023-02-04: 1.25 mg via INTRAVENOUS
  Filled 2023-02-04: qty 2

## 2023-02-04 MED ORDER — DEXAMETHASONE SODIUM PHOSPHATE 10 MG/ML IJ SOLN
5.0000 mg | Freq: Once | INTRAMUSCULAR | Status: AC
  Administered 2023-02-04: 5 mg via INTRAVENOUS
  Filled 2023-02-04: qty 1

## 2023-02-04 MED ORDER — SODIUM CHLORIDE 0.9 % IV SOLN
3.0000 g | Freq: Once | INTRAVENOUS | Status: AC
  Administered 2023-02-04: 3 g via INTRAVENOUS

## 2023-02-04 MED ORDER — AMOXICILLIN-POT CLAVULANATE 875-125 MG PO TABS: 1.0000 | ORAL_TABLET | Freq: Two times a day (BID) | ORAL | 0 refills | Status: DC

## 2023-02-04 MED ORDER — IOHEXOL 350 MG/ML SOLN
100.0000 mL | Freq: Once | INTRAVENOUS | Status: AC | PRN
  Administered 2023-02-04: 75 mL via INTRAVENOUS

## 2023-02-04 MED ORDER — PREDNISONE 20 MG PO TABS: ORAL_TABLET | ORAL | 0 refills | Status: AC

## 2023-02-04 NOTE — ED Notes (Signed)
Pt transported to MRI 

## 2023-02-04 NOTE — Discharge Instructions (Addendum)
You were seen for vertigo.  Stop the amoxicillin and take the Augmentin as prescribed for antibiotic coverage of your ear infection.  Take the steroids as prescribed.  Take the meclizine as prescribed as well.  Follow-up with the ear nose and throat doctor listed in your discharge paperwork regarding your visit to the ER today.  Come back if any difficulty swallowing, difficulty speaking, weakness of the arms or legs, inability to walk, or any other symptoms concerning to you.

## 2023-02-04 NOTE — ED Triage Notes (Signed)
Dizziness starting last Thursday. Seen by PCP started on meclizine and antibiotics for possible ear infection-no ear pain or discharge, no respiratory issues. Denies CP. No change in dizziness. No observable focal deficits appreciated. No vision changes. Has CT head scheduled for next Saturday to follow up.

## 2023-02-04 NOTE — ED Notes (Signed)
Pt transfer from [facility] for MRI

## 2023-02-04 NOTE — ED Notes (Signed)
Cala Bradford with cl called for ed to ed transport

## 2023-02-04 NOTE — ED Triage Notes (Signed)
Pt transfer from [facility] for vertigo/dizziness for MRI

## 2023-02-04 NOTE — ED Provider Notes (Signed)
8:55 PM Patient seen after arrival as a transfer from our affiliated facility.  She is awake, alert, in no distress, speaking clearly.  MRI ordered.  11:22 PM Patient alert, speaking.  MRI reviewed, discussed, no evidence for stroke.  Patient prior prescription for meclizine was restarted on a scheduled basis for the next 5 days and she will continue other medication as prescribed, follow-up with primary care.   Gerhard Munch, MD 02/04/23 [phone]

## 2023-02-04 NOTE — ED Notes (Signed)
[facility] here for transport.  

## 2023-02-04 NOTE — ED Provider Notes (Signed)
[place] [facility] AT [facility] Provider Note   CSN: [id number] Arrival date & time: 02/04/23  1238     History  Chief Complaint  Patient presents with   Dizziness    [name] [name] is a 77 y.o. female.  With PMH of HTN, HLD, COPD, GERD who presents with 3 days of ongoing vertigo and right-sided ear pain and headache.  Patient says back in 1990s she had a similar episode of ongoing persistent vertigo and diagnosed with a infection in her sphenoid and placed on a long course of antibiotics which improved her symptoms.  She was recently seen by her PCP when symptoms started this past Thursday and given a course of meclizine and amoxicillin 3 times daily.  She has not taken any medications today but she came in because vertigo has been persistent and not improving.  It does not worsen with head movement but does have some increased nausea with positional change.  She has had no dysarthria, dysphagia, diplopia.  She does complain of some discomfort within her right ear and fullness which she previously had a perforated TM which was repaired many years ago.  No drainage from the ear.  No fevers or chills.  No vomiting.  No focal weakness numbness or tingling.  No chest pain, no shortness of breath, no syncopal episodes.   Dizziness      Home Medications Prior to Admission medications   Medication Sig Start Date End Date Taking? Authorizing Provider  amoxicillin-clavulanate (AUGMENTIN) 875-125 MG tablet Take 1 tablet by mouth every 12 (twelve) hours. 02/04/23  Yes Mardene Sayer, MD  predniSONE (DELTASONE) 20 MG tablet Take 2 tablets (40 mg total) by mouth daily for 4 days, THEN 1.5 tablets (30 mg total) daily for 1 day, THEN 1 tablet (20 mg total) daily for 1 day, THEN 0.5 tablets (10 mg total) daily for 1 day. 02/05/23 02/12/23 Yes Mardene Sayer, MD  amoxicillin (AMOXIL) 500 MG tablet Take 1 tablet (500 mg total) by mouth 3 (three) times daily. 10 days 02/02/23    Adela Glimpse, NP  Ascorbic Acid (VITAMIN C) 500 MG CAPS Take by mouth.    [provider]  atorvastatin (LIPITOR) 40 MG tablet Take 1 tablet 3 x /week for Cholesterol                                           /                              TAKE                             BY                   MOUTH 01/15/22   Lucky Cowboy, MD  B Complex Vitamins (B COMPLEX PO) Take 1 tablet by mouth every other day.     [provider]  Cholecalciferol (VITAMIN D3) 5000 UNITS TABS Take 5,000 Units by mouth 2 (two) times daily.    [provider]  Magnesium 250 MG TABS Take 250 mg by mouth daily.     [provider]  meclizine (ANTIVERT) 25 MG tablet 1/2-1 pill up to 3 times daily for motion sickness/dizziness 02/02/23   Cranford,  Archie Patten, NP  metoprolol tartrate (LOPRESSOR) 25 MG tablet TAKE 1 TABLET BY MOUTH DAILY 10/24/22   Wendall Stade, MD  mirabegron ER (MYRBETRIQ) 50 MG TB24 tablet Myrbetriq    [provider]  sertraline (ZOLOFT) 100 MG tablet Take  1 tablet  Daily  for Mood 02/22/22   Lucky Cowboy, MD      Allergies    Bactrim [sulfamethoxazole-trimethoprim] and Ketek [telithromycin]    Review of Systems   Review of Systems  Neurological:  Positive for dizziness.    Physical Exam Updated Vital Signs BP (!) 112/100   Pulse 63   Temp 98.6 F (37 C) (Oral)   Resp 12   Ht 5\' 4"  (1.626 m)   Wt 63.5 kg   SpO2 97%   BMI 24.03 kg/m  Physical Exam Constitutional: Alert and oriented. Well appearing and in no distress. Eyes: Conjunctivae are normal. ENT      Head: Normocephalic and atraumatic.      Ears: Left TM nonerythematous and not bulging.  Right TM with suppurative otitis media.  There is no pain or erythema with manipulation of ear.  No tenderness erythema or warmth of mastoid.  No drainage from ear canal. Cardiovascular: S1, S2,  Normal and symmetric distal pulses are present in all extremities.Warm and well perfused. Respiratory: Normal  respiratory effort. Gastrointestinal: Nondistended Musculoskeletal: Normal range of motion in all extremities.      Right lower leg: No tenderness or edema.      Left lower leg: No tenderness or edema. Neurologic: Normal speech and language.  Mild right-sided lateral nystagmus.  CN II through XII grossly intact.  Normal finger-to-nose and normal heel-to-shin bilaterally.  5 out of 5 strength bilateral upper and lower extremities.  Sensation grossly intact.  Ataxic gait but no falls when ambulating. Skin: Skin is warm, dry and intact. No rash noted. Psychiatric: Mood and affect are normal. Speech and behavior are normal.  ED Results / Procedures / Treatments   Labs (all labs ordered are listed, but only abnormal results are displayed) Labs Reviewed  BASIC METABOLIC PANEL  CBC WITH DIFFERENTIAL/PLATELET    EKG EKG Interpretation  Date/Time:  Saturday February 04 2023 12:50:35 EDT Ventricular Rate:  65 PR Interval:  148 QRS Duration: 78 QT Interval:  408 QTC Calculation: 424 R Axis:   30 Text Interpretation: Normal sinus rhythm Normal ECG When compared with ECG of 17-Jun-2003 15:51, No significant change was found Confirmed by Vivien Rossetti ([id number]) on 02/04/2023 2:19:57 PM  Radiology CT ANGIO HEAD NECK W WO CM  Result Date: 02/04/2023 CLINICAL DATA:  Right ear pain, dizziness/vertigo. EXAM: CT ANGIOGRAPHY HEAD AND NECK WITH AND WITHOUT CONTRAST TECHNIQUE: Multidetector CT imaging of the head and neck was performed using the standard protocol during bolus administration of intravenous contrast. Multiplanar CT image reconstructions and MIPs were obtained to evaluate the vascular anatomy. Carotid stenosis measurements (when applicable) are obtained utilizing NASCET criteria, using the distal internal carotid diameter as the denominator. RADIATION DOSE REDUCTION: This exam was performed according to the departmental dose-optimization program which includes automated exposure control, adjustment  of the mA and/or kV according to patient size and/or use of iterative reconstruction technique. CONTRAST:  75mL OMNIPAQUE IOHEXOL 350 MG/ML SOLN COMPARISON:  Head CT 11/30/2018. FINDINGS: CT HEAD FINDINGS Brain: No acute intracranial hemorrhage. Gray-white differentiation is preserved. No hydrocephalus or extra-axial collection. No mass effect or midline shift. Vascular: No hyperdense vessel or unexpected calcification. Skull: No calvarial fracture or suspicious bone lesion. Skull  base is unremarkable. Sinuses/Orbits: Unremarkable. Other: None. Review of the MIP images confirms the above findings CTA NECK FINDINGS Aortic arch: Three-vessel arch configuration. Atherosclerotic calcifications of the aortic arch and arch vessel origins. Arch vessel origins are patent. Right carotid system: No evidence of dissection, stenosis (50% or greater), or occlusion. Mild mixed plaque at the right carotid bulb. Beaded appearance of the mid right cervical ICA. Left carotid system: No evidence of dissection, stenosis (50% or greater), or occlusion. Calcified plaque at the left carotid bulb. Beaded appearance of the mid left cervical ICA. Vertebral arteries: Codominant. No evidence of dissection, stenosis (50% or greater), or occlusion. Beaded appearance of the distal V2 and V3 segments bilaterally. Skeleton: Multilevel cervical spondylosis, worst at C4-5 and C5-6, where there is at least mild spinal canal stenosis. Other neck: Unremarkable. Upper chest: Unremarkable. Review of the MIP images confirms the above findings CTA HEAD FINDINGS Anterior circulation: Intracranial ICAs are patent without stenosis or aneurysm. The proximal ACAs and MCAs are patent without stenosis or aneurysm. Distal branches are symmetric. Posterior circulation: Normal basilar artery. The SCAs, AICAs and PICAs are patent proximally. The PCAs are patent proximally without stenosis or aneurysm. Distal branches are symmetric. Venous sinuses: As permitted by  contrast timing, patent. Anatomic variants: Persistent fetal origin of the left PCA with hypoplastic left P1 segment. Review of the MIP images confirms the above findings IMPRESSION: 1. No acute intracranial abnormality. 2. No large vessel occlusion, hemodynamically significant stenosis, or evidence of dissection in the head or neck vessels. 3. Beaded appearance of the mid cervical ICAs and distal cervical vertebral arteries, suggestive of fibromuscular dysplasia. Aortic Atherosclerosis (ICD10-I70.0). Electronically Signed   By: Orvan Falconer M.D.   On: 02/04/2023 16:05    Procedures Procedures    Medications Ordered in ED Medications  diazepam (VALIUM) injection 1.25 mg (1.25 mg Intravenous Given 02/04/23 1440)  Ampicillin-Sulbactam (UNASYN) 3 g in sodium chloride 0.9 % 100 mL IVPB (0 g Intravenous Stopped 02/04/23 1612)  dexamethasone (DECADRON) injection 5 mg (5 mg Intravenous Given 02/04/23 1443)  iohexol (OMNIPAQUE) 350 MG/ML injection 100 mL (75 mLs Intravenous Contrast Given 02/04/23 1528)    ED Course/ Medical Decision Making/ A&P                             Medical Decision Making Lainee Lehrman [name] is a 77 y.o. female.  With PMH of HTN, HLD, COPD, GERD who presents with 3 days of ongoing persistent vertigo and right-sided ear pain and headache.      Patient's description of vertigo appears consistent with an acute vestibular syndrome.    Differential diagnoses for this entity includes: vestibular neuritis (no hearing loss); labyrinthitis (with hearing loss); stroke (prevalence 3-5%)  Patient's TM exam on the right seems more consistent with a suppurative otitis media.  No evidence of otitis externa or mastoiditis.  No cranial nerve dysfunction concerning for cavernous sinus thrombosis.  With patient's associated ear issues, I would favor more likely a labyrinthitis which I have started patient on steroids, Unasyn and Valium for symptom control.  However with her age and underlying  risk factors and mild ataxia on exam although I have less suspicion for stroke especially with no other deficits on exam no other cerebellar symptoms, no dysarthria dysphagia or other complaints and feel like less likely, if patient is still unsteady and ataxic would likely MRI.  Her EKG reviewed by me was unremarkable sinus rhythm with normal  QTc and no ST/T changes concerning for ischemia.  Labs also unremarkable.  Normal white blood cell count 6.9.  No anemia hemoglobin 12.8.  Creatinine 0.78 within normal limits.  Potassium 4.6.  Sodium 137.  CTA head and neck reviewed by me unremarkable.  No ICH or dissection.  Reassessed patient, still quite ataxic on ambulation.  Still no other neurologic issues.  May be some improvement however with age and underlying risk factors will transfer to [facility] for MRI brain to rule out posterior stroke.  Accepted by Dr. Rodena Medin.   Amount and/or Complexity of Data Reviewed Labs: ordered. Radiology: ordered.  Risk Prescription drug management.     Final Clinical Impression(s) / ED Diagnoses Final diagnoses:  Vertigo  Acute vestibular syndrome    Rx / DC Orders ED Discharge Orders          Ordered    predniSONE (DELTASONE) 20 MG tablet  Daily        02/04/23 1621    amoxicillin-clavulanate (AUGMENTIN) 875-125 MG tablet  Every 12 hours        02/04/23 1621              Mardene Sayer, MD 02/04/23 1623

## 2023-02-11 ENCOUNTER — Ambulatory Visit (HOSPITAL_BASED_OUTPATIENT_CLINIC_OR_DEPARTMENT_OTHER): Payer: PPO

## 2023-02-13 NOTE — Progress Notes (Deleted)
Medicare Wellness Visit and 3 month OV Assessment:   Encounter for annual medicare wellness visit Schedule mammogram, phone number given Cologuard ordered, declines further colonoscopy DEXA ordered Due annually  Atherosclerosis of aorta Control blood pressure, cholesterol, glucose, increase exercise.   Essential hypertension - continue medications, DASH diet, exercise and monitor at home. Call if greater than 130/80.  - CBC with Differential/Platelet - CMP/GFR  Emphysema Pt has stopped smoking, uses inhaler PRN with flares, rare  Abnormal Glucose Discussed general issues about diabetes pathophysiology and management., Educational material distributed., Suggested low cholesterol diet., Encouraged aerobic exercise., Discussed foot care., Reminded to get yearly retinal exam. Check A1C   Mixed hyperlipidemia Continue statin -continue medications, check lipids, decrease fatty foods, increase activity.  Check lipid  Vitamin D deficiency Continue supplement  Psoriatic arthritis/Cervical arthritis (HCC) Dr. Dierdre Forth following, now on methotrexate    Diverticulosus of colon  Bowel management; add fiber if not taking   Mitral valve disorder No SOB, CP, edema, monitor  Depression, major in remission  stress management techniques discussed, increase water, good sleep hygiene discussed, increase exercise, and increase veggies.  - will continue zoloft for now, successfully tapered off of benzo  History of tobacco use Getting annual CT lung cancer screening, 05/29/21 due this month- reorder next Chest CT lung cancer screening and they will call to schedule for her Quit smoking 05/2020  Psoriasis Follows with derm; uses topical cream from dermatology, could not tolerate methotrexate  Vertigo    Future Appointments  Date Time Provider Department Center  02/14/2023  3:00 PM Raynelle Dick, NP [facility] None  04/10/2023  2:30 PM Marcos Eke, PA-C [facility] None   08/16/2023  2:00 PM Raynelle Dick, NP [facility] None  02/19/2024  3:30 PM Raynelle Dick, NP [facility] None    Plan:   During the course of the visit the patient was educated and counseled about appropriate screening and preventive services including:   Influenza vaccine Td vaccine Screening electrocardiogram Screening mammography Bone densitometry screening Colorectal cancer screening Diabetes screening Glaucoma screening Nutrition counseling    Subjective:   [name] [name] is a 77 y.o. female who presents for Medicare Annual Wellness Visit and 3 month follow up for HTN, chol, preDM, and depression.   She is on zoloft for depression; successfully tapered off of ativan last year, reports sleeping well. Reports her mood as good, occasional days she feels down.   Last Friday she woke up with horrible vertigo, had chills- she went back to bed and slept for 10 hours.  Resolved within 3 days.   Pt quit smoking 05/2020.  Has emphysema by imaging, asymptomatic, no perceived benefit with inhalers Hx of 30+ pack year history, had CT lung 04/2020 with small nodule recommended for annual screening. Repeat 05/29/21 was negative and suggest repeat in 1 year  Psoriatic arthritis/cervical arthritis, followed by Dr. Dierdre Forth,. Currently not using any medication  BMI is There is no height or weight on file to calculate BMI., she has not been working on diet and exercise; she could walk 20 min walk if the weather was less hot near her home and plans to start daily in another few weeks Wt Readings from Last 3 Encounters:  02/04/23 140 lb (63.5 kg)  02/02/23 147 lb 9.6 oz (67 kg)  01/06/23 147 lb (66.7 kg)   She has aortic atherosclerosis per CT 08/2018 Follows with Dr. 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  She denies chest pain, shortness of breath, dizziness.   Pt has noticed some floaters in right eye- followed by Dr. Blima Ledger  She is on cholesterol medication (atorvastatin 40 mg three times/ week) and denies myalgias. Her cholesterol is at goal. The cholesterol last visit was:   Lab Results  Component Value Date   CHOL 193 08/15/2022   HDL 86 08/15/2022   LDLCALC 87 08/15/2022   TRIG 102 08/15/2022   CHOLHDL 2.2 08/15/2022   She has been working on diet and exercise for hx of prediabetes recently well controlled, and denies paresthesia of the feet, polydipsia, polyuria and visual disturbances. Last A1C in the office was:  Lab Results  Component Value Date   HGBA1C 5.8 (H) 08/15/2022     She continue to drink lots of water  Lab Results  Component Value Date   EGFR 70 08/15/2022    Patient is on Vitamin D supplement. Lab Results  Component Value Date   VD25OH 76 08/15/2022   Review of Systems  Constitutional:  Negative for chills, fever and weight loss.  HENT:  Negative for congestion and hearing loss.   Eyes:  Negative for blurred vision and double vision.       Floaters in eye followed by Dr. Blima Ledger  Respiratory:  Negative for cough and shortness of breath.   Cardiovascular:  Negative for chest pain, palpitations, orthopnea and leg swelling.  Gastrointestinal:  Negative for abdominal pain, constipation, diarrhea, heartburn, nausea and vomiting.  Musculoskeletal:  Negative for falls, joint pain and myalgias.  Skin:  Negative for rash.       Psoriasis patch right hand and foot  Neurological:  Negative for dizziness, tingling, tremors, loss of consciousness and headaches.  Psychiatric/Behavioral:  Negative for depression, memory loss and suicidal ideas.      Medication Review Current Outpatient Medications on File Prior to Visit  Medication Sig Dispense Refill    amoxicillin (AMOXIL) 500 MG tablet Take 1 tablet (500 mg total) by mouth 3 (three) times daily. 10 days 30 tablet 0   amoxicillin-clavulanate (AUGMENTIN) 875-125 MG tablet Take 1 tablet by mouth every 12 (twelve) hours. 14 tablet 0   Ascorbic Acid (VITAMIN C) 500 MG CAPS Take by mouth.     atorvastatin (LIPITOR) 40 MG tablet Take 1 tablet 3 x /week for Cholesterol                                           /                              TAKE                             BY                   MOUTH 39 tablet 3   B Complex Vitamins (B COMPLEX PO) Take 1 tablet by mouth every other day.      Cholecalciferol (VITAMIN D3) 5000 UNITS TABS Take 5,000 Units by  mouth 2 (two) times daily.     Magnesium 250 MG TABS Take 250 mg by mouth daily.      meclizine (ANTIVERT) 25 MG tablet 1/2-1 pill up to 3 times daily for motion sickness/dizziness 30 tablet 0   metoprolol tartrate (LOPRESSOR) 25 MG tablet TAKE 1 TABLET BY MOUTH DAILY 90 tablet 3   mirabegron ER (MYRBETRIQ) 50 MG TB24 tablet Myrbetriq     sertraline (ZOLOFT) 100 MG tablet Take  1 tablet  Daily  for Mood 90 tablet 3   No current facility-administered medications on file prior to visit.    Current Problems (verified) Patient Active Problem List   Diagnosis Date Noted   Anemia 05/06/2020   Current smoker on some days 04/25/2019   Depression, major, recurrent, in partial remission (HCC) 07/31/2018   Pulmonary emphysema (HCC) 06/13/2017   Aortic atherosclerosis (HCC) by CT Scan in 04/2020 06/13/2017   Psoriatic arthritis (HCC) 05/25/2016   Encounter for Medicare annual wellness exam 04/27/2015   Cervical arthritis (HCC) 10/10/2014   Medication management 10/02/2014   Mixed hyperlipidemia 07/01/2014   Hypertension    Other abnormal glucose    Vitamin D deficiency    Mitral valve disorder 06/16/2009   Diverticulosis of colon 05/06/2008    Screening Tests Immunization History  Administered Date(s) Administered   DT (Pediatric) 04/27/2015    Influenza, High Dose Seasonal PF 07/10/2019, 08/12/2020, 07/19/2021, 05/31/2022   PFIZER(Purple Top)SARS-COV-2 Vaccination 10/11/2019, 11/03/2019, 06/05/2020   Pneumococcal Conjugate-13 04/27/2015   Pneumococcal Polysaccharide-23 12/22/2004, 11/24/2016   Rabies, IM 05/04/2016, 05/07/2016   Td 12/16/2003   Preventative care: Last colonoscopy: 12/2016, due 2023 she does not wish to pursue colonoscopy , willing to do cologuard EGD 02/2015 Last mammogram: 05/07/20 negative repeat 1 year , wants every 2 years- she will schedule this year Last pap smear/pelvic exam: 11/2012 , 1 abnormal in her 20's, declines another DEXA: at [facility]-08/2015 normal  Echo 2009 normal EF Ct AB 11/2014 Ct chest: 08/2018, small R nodule, follow up ordered  CXR: 2018 MRI abd: 08/2018 - benign renal cyst   Prior vaccinations: TD or Tdap: 2016 Influenza: 07/2020 Pneumococcal: 2018 Prevnar 13: 2016 Shingles/Zostavax: declines Covid 19: 2/2, 2021, pfizer 1 booster 06/05/20  Names of Other Physician/Practitioners you currently use: 1. [facility]- here for primary care 2. Dr. Hyacinth Meeker, eye doctor, last visit 2023, right catarract scheduled 06/10/22 3. Dr. Laveda Norman, dentist, last visit 2023 4. Dr. Sharyn Lull, derm, last visit 2022, goes annually, had appt yesterday 05/17/22  Patient Care Team: Lucky Cowboy, MD as PCP - General (Internal Medicine) Wendall Stade, MD as PCP - Cardiology (Cardiology) Francena Hanly, MD as Consulting Physician (Orthopedic Surgery) Bradly Bienenstock, MD as Consulting Physician (Orthopedic Surgery) Keturah Barre, MD as Consulting Physician (Otolaryngology) Wendall Stade, MD as Consulting Physician (Cardiology) Barron Alvine, MD (Inactive) as Consulting Physician (Urology) Fontaine, Nadyne Coombes, MD (Inactive) as Consulting Physician (Gynecology) Haverstock, Elvin So, MD as Referring Physician (Dermatology) Blima Ledger, OD (Optometry) Lesleigh Noe ,  DDS Sundra Aland, [facility] as Pharmacist (Pharmacist) Elwyn Reach (Neurology)   Allergies Allergies  Allergen Reactions   Bactrim [Sulfamethoxazole-Trimethoprim] Swelling    Face and eyes swollen and red.   Ketek [Telithromycin]     Unknown    SURGICAL HISTORY She  has a past surgical history that includes Tympanoplasty (Right); Rotator cuff repair (Left); Ectopic pregnancy surgery (1971, 1979); Hemorrhoid surgery; Oophorectomy; Gynecologic cryosurgery (1976); Abdominal hysterectomy (2000); Rotator cuff repair (Right, 2/15);  Colonoscopy; Esophagogastroduodenoscopy; Upper gastrointestinal endoscopy; Ganglion cyst excision (Right); Finger surgery; Gastrocnemius Recession (Left, 06/25/2020); Calcaneal osteotomy (Left, 06/25/2020); Tenolysis (Left, 06/25/2020); and Foot arthrodesis (Left, 06/25/2020). FAMILY HISTORY Her family history includes Cirrhosis in her father; Colon cancer in her maternal grandmother; Colon polyps in her sister; Diabetes in her father and mother; Heart disease in her father, maternal grandfather, paternal grandfather, and paternal grandmother; Hyperlipidemia in her father; Hypertension in her father and mother; Lymphoma in her maternal grandmother; Melanoma in her mother; Stomach cancer in her maternal grandmother; Uterine cancer in her sister. SOCIAL HISTORY She  reports that she has been smoking cigarettes. She started smoking about 57 years ago. She has a 32.40 pack-year smoking history. She has never used smokeless tobacco. She reports current alcohol use. She reports that she does not use drugs.  MEDICARE WELLNESS OBJECTIVES: Physical activity:   Cardiac risk factors:   Depression/mood screen:      05/31/2022    4:19 PM  Depression screen PHQ 2/9  Decreased Interest 0  Down, Depressed, Hopeless 0  PHQ - 2 Score 0    ADLs:     05/31/2022    4:21 PM  In your present state of health, do you have any difficulty performing the following activities:   Hearing? 0  Vision? 0  Difficulty concentrating or making decisions? 0  Walking or climbing stairs? 0  Dressing or bathing? 0  Doing errands, shopping? 0    Cognitive Testing  Alert? Yes  Normal Appearance?Yes  Oriented to person? Yes  Place? Yes   Time? Yes  Recall of three objects?  Yes  Can perform simple calculations? Yes  Displays appropriate judgment?Yes  Can read the correct time from a watch face?Yes  EOL planning:     Objective:   There were no vitals taken for this visit. There is no height or weight on file to calculate BMI.  General appearance: alert, no distress, WD/WN,  female HEENT: normocephalic, sclerae anicteric, TMs pearly fluid noted Right TM, nares patent, no discharge or erythema, pharynx normal, right inner nostril at septum with small tender nodule, non fluctuant  Oral cavity: MMM, no lesions Neck: supple, no lymphadenopathy, no thyromegaly, no masses Heart: RRR, bradycardia, no murmurs, clicks, rubs, gallop, normal S1, S2 Lungs: CTA bilaterally, no wheezes, rhonchi, or rales Abdomen: +bs, soft, non tender, no hepatomegaly, no splenomegaly Musculoskeletal: left ankle with mild swelling, heat, tenderness with palpation below malleolus, full ROM without laxity or pain; non-antalgic gait Extremities:  no cyanosis, no clubbing Pulses: 2+ symmetric, upper and lower extremities, normal cap refill Derm: warm, dry; she has plaque to right palmar hand, approx 5 cm area, another to R lateral ankle Neurological: alert, oriented x 3, CN2-12 intact, strength normal upper extremities and lower extremities, sensation normal throughout, DTRs 2+ throughout, no cerebellar signs, gait normal Psychiatric: normal affect, behavior normal, pleasant   Medicare Attestation I have personally reviewed: The patient's medical and social history Their use of alcohol, tobacco or illicit drugs Their current medications and supplements The patient's functional ability including  ADLs,fall risks, home safety risks, cognitive, and hearing and visual impairment Diet and physical activities Evidence for depression or mood disorders  The patient's weight, height, BMI, and visual acuity have been recorded in the chart.  I have made referrals, counseling, and provided education to the patient based on review of the above and I have provided the patient with a written personalized care plan for preventive services.    Revonda Humphrey ANP-C  [facility]  02/13/2023

## 2023-02-14 ENCOUNTER — Ambulatory Visit: Payer: PPO | Admitting: Nurse Practitioner

## 2023-02-14 DIAGNOSIS — E559 Vitamin D deficiency, unspecified: Secondary | ICD-10-CM

## 2023-02-14 DIAGNOSIS — Z87891 Personal history of nicotine dependence: Secondary | ICD-10-CM

## 2023-02-14 DIAGNOSIS — Z0000 Encounter for general adult medical examination without abnormal findings: Secondary | ICD-10-CM

## 2023-02-14 DIAGNOSIS — L4050 Arthropathic psoriasis, unspecified: Secondary | ICD-10-CM

## 2023-02-14 DIAGNOSIS — K5730 Diverticulosis of large intestine without perforation or abscess without bleeding: Secondary | ICD-10-CM

## 2023-02-14 DIAGNOSIS — M47812 Spondylosis without myelopathy or radiculopathy, cervical region: Secondary | ICD-10-CM

## 2023-02-14 DIAGNOSIS — J439 Emphysema, unspecified: Secondary | ICD-10-CM

## 2023-02-14 DIAGNOSIS — F3341 Major depressive disorder, recurrent, in partial remission: Secondary | ICD-10-CM

## 2023-02-14 DIAGNOSIS — Z79899 Other long term (current) drug therapy: Secondary | ICD-10-CM

## 2023-02-14 DIAGNOSIS — I10 Essential (primary) hypertension: Secondary | ICD-10-CM

## 2023-02-14 DIAGNOSIS — E782 Mixed hyperlipidemia: Secondary | ICD-10-CM

## 2023-02-14 DIAGNOSIS — I700 Atherosclerosis of aorta: Secondary | ICD-10-CM

## 2023-02-14 DIAGNOSIS — L409 Psoriasis, unspecified: Secondary | ICD-10-CM

## 2023-02-14 DIAGNOSIS — I059 Rheumatic mitral valve disease, unspecified: Secondary | ICD-10-CM

## 2023-02-14 DIAGNOSIS — R7309 Other abnormal glucose: Secondary | ICD-10-CM

## 2023-02-14 DIAGNOSIS — R42 Dizziness and giddiness: Secondary | ICD-10-CM

## 2023-02-17 ENCOUNTER — Other Ambulatory Visit: Payer: PPO

## 2023-02-22 DIAGNOSIS — G453 Amaurosis fugax: Secondary | ICD-10-CM | POA: Diagnosis not present

## 2023-02-22 DIAGNOSIS — D3131 Benign neoplasm of right choroid: Secondary | ICD-10-CM | POA: Diagnosis not present

## 2023-02-24 DIAGNOSIS — R42 Dizziness and giddiness: Secondary | ICD-10-CM | POA: Diagnosis not present

## 2023-02-24 DIAGNOSIS — H903 Sensorineural hearing loss, bilateral: Secondary | ICD-10-CM | POA: Diagnosis not present

## 2023-02-28 NOTE — Progress Notes (Unsigned)
Medicare Wellness Visit and 3 month OV Assessment:   Encounter for annual medicare wellness visit Schedule mammogram, phone number given Cologuard ordered, declines further colonoscopy DEXA ordered Due annually  Atherosclerosis of aorta Control blood pressure, cholesterol, glucose, increase exercise.   Essential hypertension - continue medications, DASH diet, exercise and monitor at home. Call if greater than 130/80.  - CBC with Differential/Platelet - CMP/GFR  Emphysema Pt has stopped smoking, uses inhaler PRN with flares, rare  Abnormal Glucose Discussed general issues about diabetes pathophysiology and management., Educational material distributed., Suggested low cholesterol diet., Encouraged aerobic exercise., Discussed foot care., Reminded to get yearly retinal exam. Check A1C   Mixed hyperlipidemia Continue statin -continue medications, check lipids, decrease fatty foods, increase activity.  Check lipid   Vitamin D deficiency Continue supplement  Psoriatic arthritis/Cervical arthritis (HCC) Dr. Dierdre Forth following, now on methotrexate    Diverticulosus of colon  Bowel management; add fiber if not taking   Mitral valve disorder No SOB, CP, edema, monitor  Depression  stress management techniques discussed, increase water, good sleep hygiene discussed, increase exercise, and increase veggies.  - will continue zoloft for now, successfully tapered off of benzo  History of tobacco use Getting annual CT lung cancer screening, 05/29/21 due this month- reorder next Chest CT lung cancer screening and they will call to schedule for her Quit smoking 05/2020  Psoriasis Follows with derm; uses topical cream from dermatology, could not tolerate methotrexate  Vertigo She has been evaluated at ENT  02/25/23 and is to have electrophysiology testing and videonystagmography on 03/16/23   Future Appointments  Date Time Provider Department Center  04/10/2023  2:30 PM Marcos Eke, PA-C [facility] None  08/16/2023  2:00 PM Raynelle Dick, NP [facility] None  02/29/2024  2:30 PM Raynelle Dick, NP [facility] None    Plan:   During the course of the visit the patient was educated and counseled about appropriate screening and preventive services including:   Influenza vaccine Td vaccine Screening electrocardiogram Screening mammography Bone densitometry screening Colorectal cancer screening Diabetes screening Glaucoma screening Nutrition counseling    Subjective:   [name] [name] is a 77 y.o. female who presents for Medicare Annual Wellness Visit and 3 month follow up for HTN, chol, preDM, and depression.   She is on zoloft for depression; successfully tapered off of ativan last year, reports sleeping well. Reports her mood as good, occasional days she feels down.   She continues to have vertigo symptoms and is being worked up by ENT, has further testing scheduled for 03/16/23 - states the vertigo is occurring with walking and lying down.   Pt quit smoking 05/2020.  Has emphysema by imaging, asymptomatic, no perceived benefit with inhalers Hx of 30+ pack year history, had CT lung 04/2020 with small nodule recommended for annual screening. Last CT 06/24/22 was negative and suggest repeat in 1 year  Psoriatic arthritis/cervical arthritis, followed by Dr. Dierdre Forth,. Currently not using any medication, no pain.   BMI is Body mass index is 25.85 kg/m., she has not been working on diet and exercise; she could walk 20 min walk if the weather was less hot near her home and walks in stores and in her house Wt Readings from Last 3 Encounters:  03/01/23 150 lb 9.6 oz (68.3 kg)  02/04/23 140 lb (63.5 kg)  02/02/23 147 lb 9.6 oz (67 kg)   She has aortic atherosclerosis per CT 08/2018 Follows with Dr. 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  She denies chest pain, shortness of breath, dizziness.   Pt has noticed some floaters in right eye- followed by Dr. Blima Ledger. She has been released by retina specialist and goes to Dr. Hyacinth Meeker 04/2023  She is on cholesterol medication (atorvastatin 40 mg three times/ week) and denies myalgias. Her cholesterol is at goal. The cholesterol last visit was:   Lab Results  Component Value Date   CHOL 193 08/15/2022   HDL 86 08/15/2022   LDLCALC 87 08/15/2022   TRIG 102 08/15/2022   CHOLHDL 2.2 08/15/2022   She has been working on diet and exercise for hx of prediabetes recently well controlled, and denies paresthesia of the feet, polydipsia, polyuria and visual disturbances. Last A1C in the office was:  Lab Results  Component Value Date   HGBA1C 5.8 (H) 08/15/2022    She continue to drink lots of water  Lab Results  Component Value Date   EGFR 70 08/15/2022    Patient is on Vitamin D supplement. Lab Results  Component Value Date   VD25OH 76 08/15/2022   Review of Systems  Constitutional:  Negative for chills, fever and weight loss.  HENT:  Negative for congestion and hearing loss.   Eyes:  Negative for blurred vision and double vision.       Floaters in eye followed by Dr. Blima Ledger  Respiratory:  Negative for cough and shortness of breath.   Cardiovascular:  Negative for chest pain, palpitations, orthopnea and leg swelling.  Gastrointestinal:  Negative for abdominal pain, constipation, diarrhea, heartburn, nausea and vomiting.  Musculoskeletal:  Positive for back pain (infrequently). Negative for falls, joint pain and myalgias.  Skin:  Negative for rash.       Psoriasis patch right foot  Neurological:  Positive for dizziness and headaches (along with dizziness). Negative for tingling,  tremors and loss of consciousness.  Psychiatric/Behavioral:  Negative for depression, memory loss and suicidal ideas.      Medication Review Current Outpatient Medications on File Prior to Visit  Medication Sig Dispense Refill   Ascorbic Acid (VITAMIN C) 500 MG CAPS Take by mouth.     atorvastatin (LIPITOR) 40 MG tablet Take 1 tablet 3 x /week for Cholesterol                                           /                              TAKE                             BY                   MOUTH 39 tablet 3   B Complex Vitamins (B COMPLEX PO) Take 1 tablet by mouth every other day.      Cholecalciferol (VITAMIN D3) 5000 UNITS TABS Take 5,000 Units by mouth 2 (two) times daily.  Magnesium 250 MG TABS Take 250 mg by mouth daily.      meclizine (ANTIVERT) 25 MG tablet 1/2-1 pill up to 3 times daily for motion sickness/dizziness 30 tablet 0   metoprolol tartrate (LOPRESSOR) 25 MG tablet TAKE 1 TABLET BY MOUTH DAILY 90 tablet 3   sertraline (ZOLOFT) 100 MG tablet Take  1 tablet  Daily  for Mood 90 tablet 3   amoxicillin (AMOXIL) 500 MG tablet Take 1 tablet (500 mg total) by mouth 3 (three) times daily. 10 days (Patient not taking: Reported on 03/01/2023) 30 tablet 0   amoxicillin-clavulanate (AUGMENTIN) 875-125 MG tablet Take 1 tablet by mouth every 12 (twelve) hours. (Patient not taking: Reported on 03/01/2023) 14 tablet 0   mirabegron ER (MYRBETRIQ) 50 MG TB24 tablet Myrbetriq (Patient not taking: Reported on 03/01/2023)     No current facility-administered medications on file prior to visit.    Current Problems (verified) Patient Active Problem List   Diagnosis Date Noted   Anemia 05/06/2020   Current smoker on some days 04/25/2019   Depression, major, recurrent, in partial remission (HCC) 07/31/2018   Pulmonary emphysema (HCC) 06/13/2017   Aortic atherosclerosis (HCC) by CT Scan in 04/2020 06/13/2017   Psoriatic arthritis (HCC) 05/25/2016   Encounter for Medicare annual wellness exam 04/27/2015    Cervical arthritis (HCC) 10/10/2014   Medication management 10/02/2014   Mixed hyperlipidemia 07/01/2014   Hypertension    Other abnormal glucose    Vitamin D deficiency    Mitral valve disorder 06/16/2009   Diverticulosis of colon 05/06/2008    Screening Tests Immunization History  Administered Date(s) Administered   DT (Pediatric) 04/27/2015   Influenza, High Dose Seasonal PF 07/10/2019, 08/12/2020, 07/19/2021, 05/31/2022   PFIZER(Purple Top)SARS-COV-2 Vaccination 10/11/2019, 11/03/2019, 06/05/2020   Pneumococcal Conjugate-13 04/27/2015   Pneumococcal Polysaccharide-23 12/22/2004, 11/24/2016   Rabies, IM 05/04/2016, 05/07/2016   Td 12/16/2003   Health Maintenance  Topic Date Due   COVID-19 Vaccine (4 - 2023-24 season) 03/17/2023 (Originally 04/29/2022)   Zoster Vaccines- Shingrix (1 of 2) 06/01/2023 (Originally 07/19/1965)   INFLUENZA VACCINE  03/30/2023   Lung Cancer Screening  06/25/2023   Medicare Annual Wellness (AWV)  02/29/2024   MAMMOGRAM  06/20/2024   DTaP/Tdap/Td (3 - Tdap) 04/26/2025   Pneumonia Vaccine 77+ Years old  Completed   DEXA SCAN  Completed   Hepatitis C Screening  Completed   HPV VACCINES  Aged Out   Colonoscopy  Discontinued      Names of Other Physician/Practitioners you currently use: 1. [facility]- here for primary care 2. Dr. Hyacinth Meeker, eye doctor, l1/2024 3. Dr. Regino Schultze, dentist, last visit 10/2022 4. Dr. Sharyn Lull, derm, last visit 2022, goes annually, had appt yesterday 05/17/22  Patient Care Team: Lucky Cowboy, MD as PCP - General (Internal Medicine) Wendall Stade, MD as PCP - Cardiology (Cardiology) Francena Hanly, MD as Consulting Physician (Orthopedic Surgery) Bradly Bienenstock, MD as Consulting Physician (Orthopedic Surgery) Keturah Barre, MD as Consulting Physician (Otolaryngology) Wendall Stade, MD as Consulting Physician (Cardiology) Barron Alvine, MD (Inactive) as Consulting Physician  (Urology) Fontaine, Nadyne Coombes, MD (Inactive) as Consulting Physician (Gynecology) Haverstock, Elvin So, MD as Referring Physician (Dermatology) Blima Ledger, OD (Optometry) Lesleigh Noe , DDS Sundra Aland, [facility] (Inactive) as Pharmacist (Pharmacist) Elwyn Reach (Neurology)   Allergies Allergies  Allergen Reactions   Bactrim [Sulfamethoxazole-Trimethoprim] Swelling    Face and eyes swollen and red.   Ketek [Telithromycin]     Unknown  SURGICAL HISTORY She  has a past surgical history that includes Tympanoplasty (Right); Rotator cuff repair (Left); Ectopic pregnancy surgery (1971, 1979); Hemorrhoid surgery; Oophorectomy; Gynecologic cryosurgery (1976); Abdominal hysterectomy (2000); Rotator cuff repair (Right, 2/15); Colonoscopy; Esophagogastroduodenoscopy; Upper gastrointestinal endoscopy; Ganglion cyst excision (Right); Finger surgery; Gastrocnemius Recession (Left, 06/25/2020); Calcaneal osteotomy (Left, 06/25/2020); Tenolysis (Left, 06/25/2020); and Foot arthrodesis (Left, 06/25/2020). FAMILY HISTORY Her family history includes Cirrhosis in her father; Colon cancer in her maternal grandmother; Colon polyps in her sister; Diabetes in her father and mother; Heart disease in her father, maternal grandfather, paternal grandfather, and paternal grandmother; Hyperlipidemia in her father; Hypertension in her father and mother; Lymphoma in her maternal grandmother; Melanoma in her mother; Stomach cancer in her maternal grandmother; Uterine cancer in her sister. SOCIAL HISTORY She  reports that she has been smoking cigarettes. She started smoking about 57 years ago. She has a 32.40 pack-year smoking history. She has never used smokeless tobacco. She reports current alcohol use. She reports that she does not use drugs.  MEDICARE WELLNESS OBJECTIVES: Physical activity:   Cardiac risk factors: Cardiac Risk Factors include: advanced age (>55men, >108  women);dyslipidemia;hypertension;sedentary lifestyle;family history of premature cardiovascular disease;smoking/ tobacco exposure Depression/mood screen:      03/01/2023    2:56 PM  Depression screen PHQ 2/9  Decreased Interest 0  Down, Depressed, Hopeless 1  PHQ - 2 Score 1    ADLs:     03/01/2023    2:54 PM 05/31/2022    4:21 PM  In your present state of health, do you have any difficulty performing the following activities:  Hearing? 0 0  Vision? 0 0  Difficulty concentrating or making decisions? 0 0  Walking or climbing stairs? 0 0  Dressing or bathing? 0 0  Doing errands, shopping? 0 0    Cognitive Testing  Alert? Yes  Normal Appearance?Yes  Oriented to person? Yes  Place? Yes   Time? Yes  Recall of three objects?  Yes  Can perform simple calculations? Yes  Displays appropriate judgment?Yes  Can read the correct time from a watch face?Yes  EOL planning: Does Patient Have a Medical Advance Directive?: Yes Type of Advance Directive: Healthcare Power of Attorney, Living will Does patient want to make changes to medical advance directive?: No - Patient declined Copy of Healthcare Power of Attorney in Chart?: No - copy requested   Objective:   Blood pressure 110/68, pulse 63, temperature (!) 97.5 F (36.4 C), height 5\' 4"  (1.626 m), weight 150 lb 9.6 oz (68.3 kg), SpO2 96 %. Body mass index is 25.85 kg/m.  General appearance: alert, no distress, WD/WN,  female HEENT: normocephalic, sclerae anicteric, TMs pearly fluid noted Right TM, nares patent, no discharge or erythema, pharynx normal, right inner nostril at septum with small tender nodule, non fluctuant  Oral cavity: MMM, no lesions Neck: supple, no lymphadenopathy, no thyromegaly, no masses Heart: RRR, bradycardia, no murmurs, clicks, rubs, gallop, normal S1, S2 Lungs: CTA bilaterally, no wheezes, rhonchi, or rales Abdomen: +bs, soft, non tender, no hepatomegaly, no splenomegaly Musculoskeletal: left ankle with mild  swelling, heat, tenderness with palpation below malleolus, full ROM without laxity or pain; non-antalgic gait Extremities:  no cyanosis, no clubbing Pulses: 2+ symmetric, upper and lower extremities, normal cap refill Derm: warm, dry; she has plaque to right palmar hand, approx 5 cm area, another to R lateral ankle Neurological: alert, oriented x 3, CN2-12 intact, strength normal upper extremities and lower extremities, sensation normal throughout, DTRs 2+ throughout, no  cerebellar signs, gait normal Psychiatric: normal affect, behavior normal, pleasant   Medicare Attestation I have personally reviewed: The patient's medical and social history Their use of alcohol, tobacco or illicit drugs Their current medications and supplements The patient's functional ability including ADLs,fall risks, home safety risks, cognitive, and hearing and visual impairment Diet and physical activities Evidence for depression or mood disorders  The patient's weight, height, BMI, and visual acuity have been recorded in the chart.  I have made referrals, counseling, and provided education to the patient based on review of the above and I have provided the patient with a written personalized care plan for preventive services.    Revonda Humphrey ANP-C  [facility]  03/01/2023

## 2023-03-01 ENCOUNTER — Ambulatory Visit (INDEPENDENT_AMBULATORY_CARE_PROVIDER_SITE_OTHER): Payer: PPO | Admitting: Nurse Practitioner

## 2023-03-01 ENCOUNTER — Encounter: Payer: Self-pay | Admitting: Nurse Practitioner

## 2023-03-01 VITALS — BP 110/68 | HR 63 | Temp 97.5°F | Ht 64.0 in | Wt 150.6 lb

## 2023-03-01 DIAGNOSIS — I10 Essential (primary) hypertension: Secondary | ICD-10-CM

## 2023-03-01 DIAGNOSIS — R42 Dizziness and giddiness: Secondary | ICD-10-CM

## 2023-03-01 DIAGNOSIS — Z79899 Other long term (current) drug therapy: Secondary | ICD-10-CM

## 2023-03-01 DIAGNOSIS — M47812 Spondylosis without myelopathy or radiculopathy, cervical region: Secondary | ICD-10-CM | POA: Diagnosis not present

## 2023-03-01 DIAGNOSIS — L4050 Arthropathic psoriasis, unspecified: Secondary | ICD-10-CM | POA: Diagnosis not present

## 2023-03-01 DIAGNOSIS — I059 Rheumatic mitral valve disease, unspecified: Secondary | ICD-10-CM | POA: Diagnosis not present

## 2023-03-01 DIAGNOSIS — F17200 Nicotine dependence, unspecified, uncomplicated: Secondary | ICD-10-CM

## 2023-03-01 DIAGNOSIS — E782 Mixed hyperlipidemia: Secondary | ICD-10-CM | POA: Diagnosis not present

## 2023-03-01 DIAGNOSIS — F3341 Major depressive disorder, recurrent, in partial remission: Secondary | ICD-10-CM

## 2023-03-01 DIAGNOSIS — Z0000 Encounter for general adult medical examination without abnormal findings: Secondary | ICD-10-CM

## 2023-03-01 DIAGNOSIS — J439 Emphysema, unspecified: Secondary | ICD-10-CM | POA: Diagnosis not present

## 2023-03-01 DIAGNOSIS — L409 Psoriasis, unspecified: Secondary | ICD-10-CM | POA: Diagnosis not present

## 2023-03-01 DIAGNOSIS — Z0001 Encounter for general adult medical examination with abnormal findings: Secondary | ICD-10-CM

## 2023-03-01 DIAGNOSIS — R7309 Other abnormal glucose: Secondary | ICD-10-CM

## 2023-03-01 DIAGNOSIS — I700 Atherosclerosis of aorta: Secondary | ICD-10-CM

## 2023-03-01 DIAGNOSIS — R6889 Other general symptoms and signs: Secondary | ICD-10-CM | POA: Diagnosis not present

## 2023-03-01 DIAGNOSIS — E559 Vitamin D deficiency, unspecified: Secondary | ICD-10-CM

## 2023-03-01 LAB — CBC WITH DIFFERENTIAL/PLATELET
Eosinophils Absolute: 229 cells/uL (ref 15–500)
Lymphs Abs: 2108 cells/uL (ref 850–3900)
MCH: 31.4 pg (ref 27.0–33.0)
MCV: 93.2 fL (ref 80.0–100.0)
Monocytes Relative: 5.9 %
Neutro Abs: 3466 cells/uL (ref 1500–7800)
Total Lymphocyte: 34 %

## 2023-03-01 NOTE — Patient Instructions (Signed)
Vertigo Vertigo is the feeling that you or the things around you are moving when they are not. This feeling can come and go at any time. Vertigo often goes away on its own. This condition can be dangerous if it happens when you are doing activities like driving or working with machines. Your doctor will do tests to find the cause of your vertigo. These tests will also help your doctor decide on the best treatment for you. Follow these instructions at home: Eating and drinking     Drink enough fluid to keep your pee (urine) pale yellow. Do not drink alcohol. Activity Return to your normal activities when your doctor says that it is safe. In the morning, first sit up on the side of the bed. When you feel okay, stand slowly while you hold onto something until you know that your balance is fine. Move slowly. Avoid sudden body or head movements or certain positions, as told by your doctor. Use a cane if you have trouble standing or walking. Sit down right away if you feel dizzy. Avoid doing any tasks or activities that can cause danger to you or others if you get dizzy. Avoid bending down if you feel dizzy. Place items in your home so that they are easy for you to reach without bending or leaning over. Do not drive or use machinery if you feel dizzy. General instructions Take over-the-counter and prescription medicines only as told by your doctor. Keep all follow-up visits. Contact a doctor if: Your medicine does not help your vertigo. Your problems get worse or you have new symptoms. You have a fever. You feel like you may vomit (nauseous), or this feeling gets worse. You start to vomit. Your family or friends see changes in how you act. You lose feeling (have numbness) in part of your body. You feel prickling and tingling in a part of your body. Get help right away if: You are always dizzy. You faint. You get very bad headaches. You get a stiff neck. Bright light starts to bother  you. You have trouble moving or talking. You feel weak in your hands, arms, or legs. You have changes in your hearing or in how you see (vision). These symptoms may be an emergency. Get help right away. Call your local emergency services (911 in the [place] Do not wait to see if the symptoms will go away. Do not drive yourself to the hospital. Summary Vertigo is the feeling that you or the things around you are moving when they are not. Your doctor will do tests to find the cause of your vertigo. You may be told to avoid some tasks, positions, or movements. Contact a doctor if your medicine is not helping, or if you have a fever, new symptoms, or a change in how you act. Get help right away if you get very bad headaches, or if you have changes in how you speak, hear, or see. This information is not intended to replace advice given to you by your health care provider. Make sure you discuss any questions you have with your health care provider. Document Revised: 07/15/2020 Document Reviewed: 07/15/2020 Elsevier Patient Education  2024 [employer]  

## 2023-03-02 LAB — CBC WITH DIFFERENTIAL/PLATELET
Absolute Monocytes: 366 cells/uL (ref 200–950)
Basophils Absolute: 31 cells/uL (ref 0–200)
Basophils Relative: 0.5 %
Eosinophils Relative: 3.7 %
HCT: 35.9 % (ref 35.0–45.0)
Hemoglobin: 12.1 g/dL (ref 11.7–15.5)
MCHC: 33.7 g/dL (ref 32.0–36.0)
MPV: 9.8 fL (ref 7.5–12.5)
Neutrophils Relative %: 55.9 %
Platelets: 253 10*3/uL (ref 140–400)
RBC: 3.85 10*6/uL (ref 3.80–5.10)
RDW: 12.6 % (ref 11.0–15.0)
WBC: 6.2 10*3/uL (ref 3.8–10.8)

## 2023-03-02 LAB — LIPID PANEL
Cholesterol: 201 mg/dL — ABNORMAL HIGH (ref <200)
HDL: 77 mg/dL (ref >=50)
LDL Cholesterol (Calc): 105 mg/dL (calc) — ABNORMAL HIGH
Non-HDL Cholesterol (Calc): 124 mg/dL (calc) (ref <130)
Total CHOL/HDL Ratio: 2.6 (calc) (ref <5.0)
Triglycerides: 91 mg/dL (ref <150)

## 2023-03-02 LAB — COMPLETE METABOLIC PANEL WITH GFR
AG Ratio: 1.7 (calc) (ref 1.0–2.5)
ALT: 13 U/L (ref 6–29)
AST: 13 U/L (ref 10–35)
Albumin: 4.3 g/dL (ref 3.6–5.1)
Alkaline phosphatase (APISO): 53 U/L (ref 37–153)
BUN: 15 mg/dL (ref 7–25)
CO2: 28 mmol/L (ref 20–32)
Calcium: 9.4 mg/dL (ref 8.6–10.4)
Chloride: 107 mmol/L (ref 98–110)
Creat: 0.73 mg/dL (ref 0.60–1.00)
Globulin: 2.5 g/dL (calc) (ref 1.9–3.7)
Glucose, Bld: 89 mg/dL (ref 65–99)
Potassium: 4.6 mmol/L (ref 3.5–5.3)
Sodium: 140 mmol/L (ref 135–146)
Total Bilirubin: 0.6 mg/dL (ref 0.2–1.2)
Total Protein: 6.8 g/dL (ref 6.1–8.1)
eGFR: 86 mL/min/{1.73_m2} (ref >=60)

## 2023-03-02 LAB — HEMOGLOBIN A1C
Hgb A1c MFr Bld: 5.9 % of total Hgb — ABNORMAL HIGH (ref <5.7)
Mean Plasma Glucose: 123 mg/dL
eAG (mmol/L): 6.8 mmol/L

## 2023-03-05 ENCOUNTER — Other Ambulatory Visit: Payer: Self-pay | Admitting: Internal Medicine

## 2023-03-05 DIAGNOSIS — F3341 Major depressive disorder, recurrent, in partial remission: Secondary | ICD-10-CM

## 2023-03-05 MED ORDER — SERTRALINE HCL 100 MG PO TABS
ORAL_TABLET | ORAL | 3 refills | Status: DC
Start: 2023-03-05 — End: 2023-11-28

## 2023-03-17 DIAGNOSIS — R42 Dizziness and giddiness: Secondary | ICD-10-CM | POA: Diagnosis not present

## 2023-03-28 ENCOUNTER — Encounter: Payer: Self-pay | Admitting: Nurse Practitioner

## 2023-03-28 DIAGNOSIS — H903 Sensorineural hearing loss, bilateral: Secondary | ICD-10-CM | POA: Diagnosis not present

## 2023-03-28 DIAGNOSIS — R42 Dizziness and giddiness: Secondary | ICD-10-CM | POA: Diagnosis not present

## 2023-04-02 ENCOUNTER — Other Ambulatory Visit: Payer: Self-pay | Admitting: Internal Medicine

## 2023-04-02 DIAGNOSIS — I700 Atherosclerosis of aorta: Secondary | ICD-10-CM

## 2023-04-02 DIAGNOSIS — E782 Mixed hyperlipidemia: Secondary | ICD-10-CM

## 2023-04-02 MED ORDER — ATORVASTATIN CALCIUM 40 MG PO TABS: ORAL_TABLET | ORAL | 3 refills | Status: DC

## 2023-04-10 ENCOUNTER — Ambulatory Visit: Payer: PPO | Admitting: Physician Assistant

## 2023-04-10 ENCOUNTER — Encounter: Payer: Self-pay | Admitting: Physician Assistant

## 2023-04-10 VITALS — BP 111/66 | HR 68 | Resp 18 | Ht 64.0 in | Wt 144.0 lb

## 2023-04-10 DIAGNOSIS — G453 Amaurosis fugax: Secondary | ICD-10-CM

## 2023-04-10 NOTE — Progress Notes (Signed)
[facility] Neurology Division Clinic Note - Initial Visit   Date: 04/10/23  AZHIA [name] MRN: [id number] DOB: [birth year]   Dear Dr Lucky Cowboy, MD:    Update  [name] [name] is a 77 y.o. R-handed female with history of hypertension, hyperlipidemia, depression, psoriatic arthritis, prior CTS, cervical arthritis, emphysema, tobacco use, anemia, presenting in follow up after initial presentation with brief episode of  vision loss suspicious for amaurosis fugax.  In review, she was in her usual state of health, when on 11/23/22 she called her PCP reporting temporary loss of vision on her left eye, lasting several seconds,  followed by seeing a "kaleidoscope" for about 10 minutes and then completely resolving.  She was seen by ophthalmology, who recommended workup for stroke. He did see "Inflammation and swelling the back of the L eye and gave me steroid drops".  Since that time her symptoms have resolved without recurrence.  Labs were normal.  Cardiology performed carotid Dopplers with normal results, no stenosis or occlusion. Patient  never had a similar episode.  She was seen for vertigo by ENT, likely due to viral infection of the inner ear, now resolved.  Denies headaches, dysarthria or dysphagia. No confusion or seizures. No taste or blood or metallic taste, no myalgias after the event . Denies any chest pain, or shortness of breath. Denies any fever or chills, or night sweats. She quit smoking. No hormonal supplements. Does not take a regular ASA a day now. Denies any recent long distance trips or recent surgeries. No sick contacts. Her husband has MS and   memory issues which brings significant stress to her life.   Mother had a stroke "that is how she died" in 2018-05-08  .MRI bran negative for acute findings .  She reports decreased temperature on her left foot, she attributes to possible circulatory issues, and due to financial issues, prefers to defer any studies for now.       Out-side paper records, electronic medical record, and images have been reviewed where available and summarized as:   Lab Results  Component Value Date   HGBA1C 5.9 (H) 03/01/2023   Lab Results  Component Value Date   VITAMINB12 414 09/03/2019   Lab Results  Component Value Date   TSH 1.46 08/15/2022   Lab Results  Component Value Date   ESRSEDRATE 4 12/02/2022   MRI of the brain, personally reviewed, was negative for acute findings.   Past Medical History:  Diagnosis Date   Acquired short Achilles tendon, left    Anemia    Anxiety    Arthritis    hands and spine   Blood transfusion without reported diagnosis    Cervical dysplasia 1976   Complication of anesthesia    COPD (chronic obstructive pulmonary disease) (HCC)    Depression    Diverticulitis of colon (without mention of hemorrhage)(562.11)    Diverticulosis of colon (without mention of hemorrhage)    Elevated cholesterol    GERD (gastroesophageal reflux disease)    no meds now   Heart murmur    Hypertension    pt states she takes med for tachycardia NOT HTN -metoprolol taken for tachycardia   Internal hemorrhoids without mention of complication    Mitral valve disorders(424.0)    MRSA infection    Osteoporosis    spine   Palpitations    Pelvic adhesive disease    Plaque psoriasis    PONV (postoperative nausea and vomiting)    Prediabetes  Tachycardia    never had HTN   Vitamin D deficiency     Past Surgical History:  Procedure Laterality Date   ABDOMINAL HYSTERECTOMY  2000   TAH,BSO endometriosis, leiomyomata   CALCANEAL OSTEOTOMY Left 06/25/2020   Procedure: Medializing calcaneal osteotomy;  Surgeon: Toni Arthurs, MD;  Location: [facility];  Service: Orthopedics;  Laterality: Left;   COLONOSCOPY     ECTOPIC PREGNANCY SURGERY  1971, 1979   ESOPHAGOGASTRODUODENOSCOPY     FINGER SURGERY     FOOT ARTHRODESIS Left 06/25/2020   Procedure: Talonavicular Arthrodesis;  Surgeon:  Toni Arthurs, MD;  Location: [facility];  Service: Orthopedics;  Laterality: Left;   GANGLION CYST EXCISION Right    hand    GASTROCNEMIUS RECESSION Left 06/25/2020   Procedure: Left gastroc recession;  Surgeon: Toni Arthurs, MD;  Location: [facility];  Service: Orthopedics;  Laterality: Left;   GYNECOLOGIC CRYOSURGERY  1976   HEMORRHOID SURGERY     OOPHORECTOMY     ROTATOR CUFF REPAIR Left    ROTATOR CUFF REPAIR Right 2/15   TENOLYSIS Left 06/25/2020   Procedure: Left Posterior Tibial Tenolysis;  Surgeon: Toni Arthurs, MD;  Location: [facility];  Service: Orthopedics;  Laterality: Left;   TYMPANOPLASTY Right    UPPER GASTROINTESTINAL ENDOSCOPY       Medications:  Outpatient Encounter Medications as of 04/10/2023  Medication Sig   Ascorbic Acid (VITAMIN C) 500 MG CAPS Take by mouth.   atorvastatin (LIPITOR) 40 MG tablet Take 1 tablet 3 x /week for Cholesterol                                           /                              TAKE                             BY                   MOUTH   B Complex Vitamins (B COMPLEX PO) Take 1 tablet by mouth every other day.    Cholecalciferol (VITAMIN D3) 5000 UNITS TABS Take 5,000 Units by mouth 2 (two) times daily.   Magnesium 250 MG TABS Take 250 mg by mouth daily.    meclizine (ANTIVERT) 25 MG tablet 1/2-1 pill up to 3 times daily for motion sickness/dizziness   metoprolol tartrate (LOPRESSOR) 25 MG tablet TAKE 1 TABLET BY MOUTH DAILY   sertraline (ZOLOFT) 100 MG tablet Take   1 tablet   Daily   for Mood                                                                                                       /  TAKE                                         BY                                                 MOUTH   No facility-administered encounter medications on file as of 04/10/2023.    Allergies:  Allergies  Allergen Reactions    Bactrim [Sulfamethoxazole-Trimethoprim] Swelling    Face and eyes swollen and red.   Ketek [Telithromycin]     Unknown    Family History: Family History  Problem Relation Age of Onset   Diabetes Mother    Hypertension Mother    Melanoma Mother    Diabetes Father    Heart disease Father    Hyperlipidemia Father    Hypertension Father    Cirrhosis Father        alcoholic   Uterine cancer Sister        UTERINE   Colon cancer Maternal Grandmother        dx in her 76's ?mets from lymphoma   Stomach cancer Maternal Grandmother    Lymphoma Maternal Grandmother    Heart disease Maternal Grandfather    Heart disease Paternal Grandmother    Heart disease Paternal Grandfather    Colon polyps Sister    Esophageal cancer Neg Hx    Rectal cancer Neg Hx    Kidney disease Neg Hx    Pancreatic cancer Neg Hx     Social History: Social History   Tobacco Use   Smoking status: Some Days    Current packs/day: 0.00    Average packs/day: 0.6 packs/day for 54.8 years (32.9 ttl pk-yrs)    Types: Cigarettes    Start date: 101    Last attempt to quit: 06/10/2020    Years since quitting: 2.8   Smokeless tobacco: Never   Tobacco comments:    Smokes when she drinks wine  Vaping Use   Vaping status: Never Used  Substance Use Topics   Alcohol use: Yes    Comment: social   Drug use: No   Social History   Social History Narrative   She lives with husband.  They do not have children.   She previously worked at [employer].   Highest level of education:  High school       Right Handed    Lives in a two story home    Vital Signs:  BP 111/66   Pulse 68   Resp 18   Ht 5\' 4"  (1.626 m)   Wt 144 lb (65.3 kg)   SpO2 98%   BMI 24.72 kg/m    General Medical Exam:   General:  Well appearing, comfortable.   Eyes/ENT: see cranial nerve examination.   Neck:   No carotid bruits. Respiratory:  Clear to auscultation, good air entry bilaterally.   Cardiac:  Regular rate and rhythm, no  murmur.   Extremities:  No deformities, edema, or skin discoloration.  Skin:  No rashes or lesions.  Neurological Exam: MENTAL STATUS including orientation to time, place, person, recent and remote memory, attention span and concentration, language, and fund of knowledge is normal.  Speech is not dysarthric.  CRANIAL NERVES: II:  No visual  field defects.  Unremarkable fundi.   III-IV-VI: Pupils equal round and reactive to light.  Normal conjugate, extra-ocular eye movements in all directions of gaze.  No nystagmus.  No ptosis .   V:  Normal facial sensation.    VII:  Normal facial symmetry and movements.   VIII:  Normal hearing and vestibular function.   IX-X:  Normal palatal movement.   XI:  Normal shoulder shrug and head rotation.   XII:  Normal tongue strength and range of motion, no deviation or fasciculation.  MOTOR:  No atrophy, fasciculations or abnormal movements.  No pronator drift.    SENSORY:  Normal and symmetric perception of light touch, pinprick, vibration, and proprioception.  Decreased temperature L plantar area   COORDINATION/GAIT: Normal finger-to- nose-finger and heel-to-shin.  Intact rapid alternating movements bilaterally.  Able to rise from a chair without using arms.  Gait narrow based and stable. Tandem and stressed gait intact.    A bandage around the foot Decrease temp in the L metatarsal area    Amaurosis fugax, unclear etiology , resolved     Recommend good control of cardiovascular risk factors.    Recommend baby ASA daily and  good control of cardiovascular risk factors.   There is concern for possible arterial disease on her LLE, but with pulse at this time. She has been instructed to monitor closely and if there is any absence of pulse, or severe temperature change to seek ED immediately for further evaluation. She has been offered to have ABI but patient is experiencing some financial issues and prefers to wait for now. I recommend contacting [employer]  Agree with tobacco discontinuation  Follow up as needed  Total time spent:   Thank you for allowing me to participate in patient's care.  If I can answer any additional questions, I would be pleased to do so.    Sincerely,   Marlowe Kays, PA-C

## 2023-04-21 ENCOUNTER — Ambulatory Visit: Payer: PPO | Attending: Otolaryngology | Admitting: Physical Therapy

## 2023-04-21 ENCOUNTER — Encounter: Payer: Self-pay | Admitting: Physical Therapy

## 2023-04-21 VITALS — BP 128/67 | HR 63

## 2023-04-21 DIAGNOSIS — R2681 Unsteadiness on feet: Secondary | ICD-10-CM | POA: Diagnosis not present

## 2023-04-21 DIAGNOSIS — R42 Dizziness and giddiness: Secondary | ICD-10-CM | POA: Diagnosis not present

## 2023-04-21 NOTE — Therapy (Signed)
OUTPATIENT PHYSICAL THERAPY VESTIBULAR EVALUATION     Patient Name: [name] [name] MRN: [id number] DOB:1946-02-05, 77 y.o., female Today's Date: 04/21/2023  END OF SESSION:  PT End of Session - 04/21/23 1452     Visit Number 1    Number of Visits 5    Date for PT Re-Evaluation 05/21/23    Authorization Type [employer]    PT Start Time 1403    PT Stop Time 1443    PT Time Calculation (min) 40 min    Equipment Utilized During Treatment Gait belt    Activity Tolerance Patient tolerated treatment well    Behavior During Therapy WFL for tasks assessed/performed             Past Medical History:  Diagnosis Date   Acquired short Achilles tendon, left    Anemia    Anxiety    Arthritis    hands and spine   Blood transfusion without reported diagnosis    Cervical dysplasia 1976   Complication of anesthesia    COPD (chronic obstructive pulmonary disease) (HCC)    Depression    Diverticulitis of colon (without mention of hemorrhage)(562.11)    Diverticulosis of colon (without mention of hemorrhage)    Elevated cholesterol    GERD (gastroesophageal reflux disease)    no meds now   Heart murmur    Hypertension    pt states she takes med for tachycardia NOT HTN -metoprolol taken for tachycardia   Internal hemorrhoids without mention of complication    Mitral valve disorders(424.0)    MRSA infection    Osteoporosis    spine   Palpitations    Pelvic adhesive disease    Plaque psoriasis    PONV (postoperative nausea and vomiting)    Prediabetes    Tachycardia    never had HTN   Vitamin D deficiency    Past Surgical History:  Procedure Laterality Date   ABDOMINAL HYSTERECTOMY  2000   TAH,BSO endometriosis, leiomyomata   CALCANEAL OSTEOTOMY Left 06/25/2020   Procedure: Medializing calcaneal osteotomy;  Surgeon: Toni Arthurs, MD;  Location: [facility];  Service: Orthopedics;  Laterality: Left;   COLONOSCOPY     ECTOPIC PREGNANCY SURGERY   1971, 1979   ESOPHAGOGASTRODUODENOSCOPY     FINGER SURGERY     FOOT ARTHRODESIS Left 06/25/2020   Procedure: Talonavicular Arthrodesis;  Surgeon: Toni Arthurs, MD;  Location: [facility];  Service: Orthopedics;  Laterality: Left;   GANGLION CYST EXCISION Right    hand    GASTROCNEMIUS RECESSION Left 06/25/2020   Procedure: Left gastroc recession;  Surgeon: Toni Arthurs, MD;  Location: [facility];  Service: Orthopedics;  Laterality: Left;   GYNECOLOGIC CRYOSURGERY  1976   HEMORRHOID SURGERY     OOPHORECTOMY     ROTATOR CUFF REPAIR Left    ROTATOR CUFF REPAIR Right 2/15   TENOLYSIS Left 06/25/2020   Procedure: Left Posterior Tibial Tenolysis;  Surgeon: Toni Arthurs, MD;  Location: [facility];  Service: Orthopedics;  Laterality: Left;   TYMPANOPLASTY Right    UPPER GASTROINTESTINAL ENDOSCOPY     Patient Active Problem List   Diagnosis Date Noted   Anemia 05/06/2020   Current smoker on some days 04/25/2019   Depression, major, recurrent, in partial remission (HCC) 07/31/2018   Pulmonary emphysema (HCC) 06/13/2017   Aortic atherosclerosis (HCC) by CT Scan in 04/2020 06/13/2017   Psoriatic arthritis (HCC) 05/25/2016   Encounter for Medicare annual wellness exam 04/27/2015  Cervical arthritis (HCC) 10/10/2014   Medication management 10/02/2014   Mixed hyperlipidemia 07/01/2014   Hypertension    Other abnormal glucose    Vitamin D deficiency    Mitral valve disorder 06/16/2009   Diverticulosis of colon 05/06/2008    PCP: Lucky Cowboy, MD   REFERRING PROVIDER:  Karle Barr, MD  REFERRING DIAG: R42 (ICD-10-CM) - Dizziness   THERAPY DIAG:  Dizziness and giddiness  Unsteadiness on feet  ONSET DATE: 04/05/2023  Rationale for Evaluation and Treatment: Rehabilitation  SUBJECTIVE:   SUBJECTIVE STATEMENT: Reports beginning of June she woke up with vertigo. Could barely walk. Saw her doctor who thought it was an inner ear  infection and was put on anti-biotics and Meclizine. 2 days later it was so bad and had to call an ambulance. Got an MRI done and was stable. Saw Dr. Suszanne Conners and went through all these tests and couldn't find a peripheral or central cause of dizziness. Will feel dizzy if leaning down and getting up fast or sitting and standing up fast. Sometimes turning around too quickly will start spinning. Not feeling off balance. No falls.    Pt accompanied by: self  PERTINENT HISTORY: PMH: hypertension, hyperlipidemia, depression, psoriatic arthritis, prior CTS, cervical arthritis, emphysema, tobacco use, anemia, Amaurosis fugax   Per Dr. Suszanne Conners: Recurrent dizziness, likely secondary to multifactorial causes Recent vestibular testing showed no significant peripheral or central vestibular dysfunction.   PAIN:  Are you having pain? No  Vitals:   04/21/23 1414 04/21/23 1415  BP: 126/70 128/67  Pulse: 61 63    Sitting, Standing   PRECAUTIONS: None  RED FLAGS: None   WEIGHT BEARING RESTRICTIONS: No  FALLS: Has patient fallen in last 6 months? No  LIVING ENVIRONMENT: Lives with: lives with their spouse Lives in: House/apartment Stairs: Yes: Internal: 16 steps; on right going up and External: 12-13 steps; can reach both Has following equipment at home:  "has all kinds of equipment, but does not need to use any"   PLOF: Independent  PATIENT GOALS: "Teach me how to improve balance so I don't start falling"   OBJECTIVE:   DIAGNOSTIC FINDINGS: MRI brain 02/04/23: IMPRESSION: Stable normal brain MRI.  No acute intracranial abnormality.  COGNITION: Overall cognitive status: Within functional limits for tasks assessed  POSTURE:  No Significant postural limitations   GAIT: Gait pattern: WFL Distance walked: Clinic distances  Assistive device utilized: None Level of assistance: Complete Independence   PATIENT SURVEYS:  FOTO DFS: 67, DPS: 58  VESTIBULAR ASSESSMENT:  GENERAL OBSERVATION:  Ambulates in independently with no AD.    SYMPTOM BEHAVIOR:  Subjective history: See above.   Non-Vestibular symptoms:  N/A  Type of dizziness:  "feels as if she is listing to the L"   Frequency: Every few days.  Duration: Not long at all   Aggravating factors: Induced by motion: bending down to the ground  Relieving factors:  "it just wears off"   Progression of symptoms: better  OCULOMOTOR EXAM:  Ocular Alignment: normal  Ocular ROM: No Limitations  Spontaneous Nystagmus: absent  Gaze-Induced Nystagmus: absent  Smooth Pursuits: intact  Saccades: intact  VESTIBULAR - OCULAR REFLEX:   Slow VOR: Normal, mild dizziness   VOR Cancellation: Normal, mild dizziness   Head-Impulse Test: HIT Right: negative HIT Left: positive, very mild Pt feeling a little off afterwards   Dynamic Visual Acuity: Static: Line 10 Dynamic: Line 10 "Teeny bit dizzy"     M-CTSIB  Condition 1: Firm Surface, EO 30  Sec, Normal Sway  Condition 2: Firm Surface, EC 30 Sec, Mild Sway  Condition 3: Foam Surface, EO 30 Sec, Normal Sway  Condition 4: Foam Surface, EC 5 Sec      [facility] PT Assessment - 04/21/23 1430       Functional Gait  Assessment   Gait assessed  Yes    Gait Level Surface Walks 20 ft in less than 5.5 sec, no assistive devices, good speed, no evidence for imbalance, normal gait pattern, deviates no more than 6 in outside of the 12 in walkway width.    Change in Gait Speed Able to smoothly change walking speed without loss of balance or gait deviation. Deviate no more than 6 in outside of the 12 in walkway width.    Gait with Horizontal Head Turns Performs head turns smoothly with no change in gait. Deviates no more than 6 in outside 12 in walkway width    Gait with Vertical Head Turns Performs head turns with no change in gait. Deviates no more than 6 in outside 12 in walkway width.    Gait and Pivot Turn Pivot turns safely within 3 sec and stops quickly with no loss of balance.    Step Over  Obstacle Is able to step over one shoe box (4.5 in total height) without changing gait speed. No evidence of imbalance.    Gait with Narrow Base of Support Ambulates 7-9 steps.    Gait with Eyes Closed Walks 20 ft, slow speed, abnormal gait pattern, evidence for imbalance, deviates 10-15 in outside 12 in walkway width. Requires more than 9 sec to ambulate 20 ft.   12.4 seconds   Ambulating Backwards Walks 20 ft, uses assistive device, slower speed, mild gait deviations, deviates 6-10 in outside 12 in walkway width.   17.2 seconds   Steps Alternating feet, no rail.    Total Score 25    FGA comment: 25/30 = Low Fall Risk              VESTIBULAR TREATMENT:                                                                                                    N/A during eval.   PATIENT EDUCATION: Education details: Clinical findings, POC, 3 balance systems and purpose of vestibular system for balance.  Person educated: Patient Education method: Explanation Education comprehension: verbalized understanding  HOME EXERCISE PROGRAM: Will provide at future session.   GOALS: Goals reviewed with patient? Yes  SHORT TERM GOALS: ALL STGS = LTGS  LONG TERM GOALS: Target date: 05/19/2023  Pt will be independent with final HEP for balance in order to build upon gains made in therapy. Baseline:  Goal status: INITIAL  2.  Pt will improve FGA to at least a 28/30 in order to demo decr fall risk.  Baseline: 25/30 Goal status: INITIAL  3.  Pt will improve condition 4 of mCTSIB to at least 20 seconds to demo improved vestibular input for balance.  Baseline: 8 seconds Goal status: INITIAL  4.  SOT to be assessed with goal  written.  Baseline:  Goal status: INITIAL   ASSESSMENT:  CLINICAL IMPRESSION: Patient is a 78 year old female referred to Neuro OPPT for dizziness. Per Dr. Suszanne Conners:  Recurrent dizziness, likely secondary to multifactorial causes. Recent vestibular testing showed no  significant peripheral or central vestibular dysfunction.   Pt's PMH is significant for: hypertension, hyperlipidemia, depression, psoriatic arthritis, prior CTS, cervical arthritis, emphysema, tobacco use, anemia, Amaurosis fugax. The following deficits were present during the exam: positive HIT to the L indicating impaired VOR and decr vestibular input for balance based on condition 4 of mCTSIB. Based on FGA, pt is a medium risk for falls. Pt would benefit from skilled PT to address these impairments and functional limitations to maximize functional mobility independence and improve balance.    OBJECTIVE IMPAIRMENTS: decreased balance and dizziness.   ACTIVITY LIMITATIONS: bending and locomotion level  PARTICIPATION LIMITATIONS: community activity  PERSONAL FACTORS: Age, Behavior pattern, Past/current experiences, and 3+ comorbidities: hypertension, hyperlipidemia, depression, psoriatic arthritis, prior CTS, cervical arthritis, emphysema, tobacco use, anemia, Amaurosis fugax   are also affecting patient's functional outcome.   REHAB POTENTIAL: Good  CLINICAL DECISION MAKING: Stable/uncomplicated  EVALUATION COMPLEXITY: Low   PLAN:  PT FREQUENCY: 1x/week  PT DURATION: 4 weeks  PLANNED INTERVENTIONS: Therapeutic exercises, Therapeutic activity, Neuromuscular re-education, Balance training, Gait training, Patient/Family education, Self Care, Joint mobilization, Vestibular training, and Canalith repositioning  PLAN FOR NEXT SESSION: Assess SOT and write goal. Initial HEP for VOR, balance with EC, tandem gait.    Drake Leach, PT,DPT 04/21/2023, 2:53 PM

## 2023-04-27 ENCOUNTER — Encounter: Payer: Self-pay | Admitting: Physical Therapy

## 2023-04-27 ENCOUNTER — Ambulatory Visit: Payer: PPO | Admitting: Physical Therapy

## 2023-04-27 VITALS — BP 133/69 | HR 75

## 2023-04-27 DIAGNOSIS — R2681 Unsteadiness on feet: Secondary | ICD-10-CM

## 2023-04-27 DIAGNOSIS — R42 Dizziness and giddiness: Secondary | ICD-10-CM

## 2023-04-27 NOTE — Therapy (Signed)
OUTPATIENT PHYSICAL THERAPY VESTIBULAR TREATMENT  Patient Name: [name] [name] MRN: [id number] DOB:1946-03-16, 77 y.o., female Today's Date: 04/27/2023  END OF SESSION:  PT End of Session - 04/27/23 1630     Visit Number 2    Number of Visits 5    Date for PT Re-Evaluation 05/21/23    Authorization Type [employer]    PT Start Time 1630    PT Stop Time 1700    PT Time Calculation (min) 30 min    Equipment Utilized During Treatment Gait belt    Activity Tolerance Patient tolerated treatment well    Behavior During Therapy WFL for tasks assessed/performed             Past Medical History:  Diagnosis Date   Acquired short Achilles tendon, left    Anemia    Anxiety    Arthritis    hands and spine   Blood transfusion without reported diagnosis    Cervical dysplasia 1976   Complication of anesthesia    COPD (chronic obstructive pulmonary disease) (HCC)    Depression    Diverticulitis of colon (without mention of hemorrhage)(562.11)    Diverticulosis of colon (without mention of hemorrhage)    Elevated cholesterol    GERD (gastroesophageal reflux disease)    no meds now   Heart murmur    Hypertension    pt states she takes med for tachycardia NOT HTN -metoprolol taken for tachycardia   Internal hemorrhoids without mention of complication    Mitral valve disorders(424.0)    MRSA infection    Osteoporosis    spine   Palpitations    Pelvic adhesive disease    Plaque psoriasis    PONV (postoperative nausea and vomiting)    Prediabetes    Tachycardia    never had HTN   Vitamin D deficiency    Past Surgical History:  Procedure Laterality Date   ABDOMINAL HYSTERECTOMY  2000   TAH,BSO endometriosis, leiomyomata   CALCANEAL OSTEOTOMY Left 06/25/2020   Procedure: Medializing calcaneal osteotomy;  Surgeon: Toni Arthurs, MD;  Location: [facility];  Service: Orthopedics;  Laterality: Left;   COLONOSCOPY     ECTOPIC PREGNANCY SURGERY  1971,  1979   ESOPHAGOGASTRODUODENOSCOPY     FINGER SURGERY     FOOT ARTHRODESIS Left 06/25/2020   Procedure: Talonavicular Arthrodesis;  Surgeon: Toni Arthurs, MD;  Location: [facility];  Service: Orthopedics;  Laterality: Left;   GANGLION CYST EXCISION Right    hand    GASTROCNEMIUS RECESSION Left 06/25/2020   Procedure: Left gastroc recession;  Surgeon: Toni Arthurs, MD;  Location: [facility];  Service: Orthopedics;  Laterality: Left;   GYNECOLOGIC CRYOSURGERY  1976   HEMORRHOID SURGERY     OOPHORECTOMY     ROTATOR CUFF REPAIR Left    ROTATOR CUFF REPAIR Right 2/15   TENOLYSIS Left 06/25/2020   Procedure: Left Posterior Tibial Tenolysis;  Surgeon: Toni Arthurs, MD;  Location: [facility];  Service: Orthopedics;  Laterality: Left;   TYMPANOPLASTY Right    UPPER GASTROINTESTINAL ENDOSCOPY     Patient Active Problem List   Diagnosis Date Noted   Anemia 05/06/2020   Current smoker on some days 04/25/2019   Depression, major, recurrent, in partial remission (HCC) 07/31/2018   Pulmonary emphysema (HCC) 06/13/2017   Aortic atherosclerosis (HCC) by CT Scan in 04/2020 06/13/2017   Psoriatic arthritis (HCC) 05/25/2016   Encounter for Medicare annual wellness exam 04/27/2015   Cervical arthritis (  HCC) 10/10/2014   Medication management 10/02/2014   Mixed hyperlipidemia 07/01/2014   Hypertension    Other abnormal glucose    Vitamin D deficiency    Mitral valve disorder 06/16/2009   Diverticulosis of colon 05/06/2008    PCP: Lucky Cowboy, MD   REFERRING PROVIDER:  Karle Barr, MD  REFERRING DIAG: R42 (ICD-10-CM) - Dizziness   THERAPY DIAG:  Dizziness and giddiness  Unsteadiness on feet  ONSET DATE: 04/05/2023  Rationale for Evaluation and Treatment: Rehabilitation  SUBJECTIVE:   SUBJECTIVE STATEMENT: Patient arrives to session and states she is doing well. Patient states her dizziness has been fairly good overall. Denies  falls/near falls.   Pt accompanied by: self  PERTINENT HISTORY: PMH: hypertension, hyperlipidemia, depression, psoriatic arthritis, prior CTS, cervical arthritis, emphysema, tobacco use, anemia, Amaurosis fugax   Per Dr. Suszanne Conners: Recurrent dizziness, likely secondary to multifactorial causes Recent vestibular testing showed no significant peripheral or central vestibular dysfunction.   PAIN:  Are you having pain? No  Vitals:   04/27/23 1633  BP: 133/69  Pulse: 75     Sitting, Standing   PRECAUTIONS: None  RED FLAGS: None   WEIGHT BEARING RESTRICTIONS: No  FALLS: Has patient fallen in last 6 months? No  LIVING ENVIRONMENT: Lives with: lives with their spouse Lives in: House/apartment Stairs: Yes: Internal: 16 steps; on right going up and External: 12-13 steps; can reach both Has following equipment at home:  "has all kinds of equipment, but does not need to use any"   PLOF: Independent  PATIENT GOALS: "Teach me how to improve balance so I don't start falling"   OBJECTIVE:   DIAGNOSTIC FINDINGS: MRI brain 02/04/23: IMPRESSION: Stable normal brain MRI.  No acute intracranial abnormality.    VESTIBULAR TREATMENT:                                                                                                    Vitals:   04/27/23 1633  BP: 133/69  Pulse: 75     VESTIBULAR TREATMENT:  Gaze Adaptation: x1 Viewing Horizontal: Position: seated plain background, Time: 30 seconds, Reps: 2, and Comment: reports mild feeling of being off but otherwise tolerates well and x1 Viewing Vertical:  Position: seated, Time: 30 seconds, Reps: 2, and Comment: improves symptom management, increased challenge with second rep  Home Exercises: - Tandem Walking with Counter Support  - 1 x daily - 7 x weekly - 3 sets - Tandem Stance  - 1 x daily - 7 x weekly - 3 sets - 30 seconds hold - Corner Balance Feet Together: Eyes Closed With Head Turns  - 1 x daily - 7 x weekly - 3 sets - 10  reps  PATIENT EDUCATION: Education details: Clinical findings, POC, 3 balance systems and purpose of vestibular system for balance.  Person educated: Patient Education method: Explanation Education comprehension: verbalized understanding  HOME EXERCISE PROGRAM:  Gaze Stabilization: Sitting    Keeping eyes on target on wall 3-5 feet away, tilt head down 15-30 and move head side to side for 30 seconds. Repeat while moving head  up and down for 30 seconds. Do 6  sessions per day.  Access Code: [email or web address] URL: [email or web address] Date: 04/27/2023 Prepared by: Maryruth Eve  Exercises - Tandem Walking with Counter Support  - 1 x daily - 7 x weekly - 3 sets - Tandem Stance  - 1 x daily - 7 x weekly - 3 sets - 30 seconds hold - Corner Balance Feet Together: Eyes Closed With Head Turns  - 1 x daily - 7 x weekly - 3 sets - 10 reps  GOALS: Goals reviewed with patient? Yes  SHORT TERM GOALS: ALL STGS = LTGS  LONG TERM GOALS: Target date: 05/19/2023  Pt will be independent with final HEP for balance in order to build upon gains made in therapy. Baseline:  Goal status: INITIAL  2.  Pt will improve FGA to at least a 28/30 in order to demo decr fall risk.  Baseline: 25/30 Goal status: INITIAL  3.  Pt will improve condition 4 of mCTSIB to at least 20 seconds to demo improved vestibular input for balance.  Baseline: 8 seconds Goal status: INITIAL  4.  SOT to be assessed with goal written.  Baseline:  Goal status: INITIAL   ASSESSMENT:  CLINICAL IMPRESSION: Session spent establishing HEP; patient tolerated session well without major increase in symptoms. Increased instability noted with tandem balance work and mild increased challenge in second round of VOR x 1 exercises. Continue POC to address impairments and progress towards LTGs.   OBJECTIVE IMPAIRMENTS: decreased balance and dizziness.   ACTIVITY LIMITATIONS: bending and locomotion level  PARTICIPATION  LIMITATIONS: community activity  PERSONAL FACTORS: Age, Behavior pattern, Past/current experiences, and 3+ comorbidities: hypertension, hyperlipidemia, depression, psoriatic arthritis, prior CTS, cervical arthritis, emphysema, tobacco use, anemia, Amaurosis fugax   are also affecting patient's functional outcome.   REHAB POTENTIAL: Good  CLINICAL DECISION MAKING: Stable/uncomplicated  EVALUATION COMPLEXITY: Low   PLAN:  PT FREQUENCY: 1x/week  PT DURATION: 4 weeks  PLANNED INTERVENTIONS: Therapeutic exercises, Therapeutic activity, Neuromuscular re-education, Balance training, Gait training, Patient/Family education, Self Care, Joint mobilization, Vestibular training, and Canalith repositioning  PLAN FOR NEXT SESSION: Assess SOT and write goal (unable to last time due to power surge), review + progress HEP, standing VOR or hart chart on foam with head turns   Carmelia Bake, PT,DPT 04/27/2023, 5:27 PM

## 2023-05-04 ENCOUNTER — Encounter: Payer: PPO | Admitting: Physical Therapy

## 2023-05-05 ENCOUNTER — Ambulatory Visit: Payer: PPO | Admitting: Physical Therapy

## 2023-05-08 ENCOUNTER — Encounter: Payer: PPO | Admitting: Physical Therapy

## 2023-05-15 ENCOUNTER — Encounter: Payer: PPO | Admitting: Physical Therapy

## 2023-05-18 DIAGNOSIS — N3946 Mixed incontinence: Secondary | ICD-10-CM | POA: Diagnosis not present

## 2023-05-18 DIAGNOSIS — R3121 Asymptomatic microscopic hematuria: Secondary | ICD-10-CM | POA: Diagnosis not present

## 2023-05-30 DIAGNOSIS — L821 Other seborrheic keratosis: Secondary | ICD-10-CM | POA: Diagnosis not present

## 2023-05-30 DIAGNOSIS — L409 Psoriasis, unspecified: Secondary | ICD-10-CM | POA: Diagnosis not present

## 2023-05-30 DIAGNOSIS — Z85828 Personal history of other malignant neoplasm of skin: Secondary | ICD-10-CM | POA: Diagnosis not present

## 2023-05-30 DIAGNOSIS — L814 Other melanin hyperpigmentation: Secondary | ICD-10-CM | POA: Diagnosis not present

## 2023-05-30 DIAGNOSIS — D225 Melanocytic nevi of trunk: Secondary | ICD-10-CM | POA: Diagnosis not present

## 2023-05-30 DIAGNOSIS — L739 Follicular disorder, unspecified: Secondary | ICD-10-CM | POA: Diagnosis not present

## 2023-05-30 DIAGNOSIS — Z79899 Other long term (current) drug therapy: Secondary | ICD-10-CM | POA: Diagnosis not present

## 2023-05-30 DIAGNOSIS — L578 Other skin changes due to chronic exposure to nonionizing radiation: Secondary | ICD-10-CM | POA: Diagnosis not present

## 2023-05-31 ENCOUNTER — Encounter: Payer: Self-pay | Admitting: Physician Assistant

## 2023-05-31 ENCOUNTER — Ambulatory Visit: Payer: PPO | Attending: Physician Assistant | Admitting: Physician Assistant

## 2023-05-31 VITALS — BP 132/68 | HR 76 | Ht 64.0 in | Wt 146.2 lb

## 2023-05-31 DIAGNOSIS — I10 Essential (primary) hypertension: Secondary | ICD-10-CM | POA: Diagnosis not present

## 2023-05-31 DIAGNOSIS — R002 Palpitations: Secondary | ICD-10-CM | POA: Diagnosis not present

## 2023-05-31 DIAGNOSIS — F17200 Nicotine dependence, unspecified, uncomplicated: Secondary | ICD-10-CM

## 2023-05-31 DIAGNOSIS — E785 Hyperlipidemia, unspecified: Secondary | ICD-10-CM

## 2023-05-31 DIAGNOSIS — I491 Atrial premature depolarization: Secondary | ICD-10-CM | POA: Diagnosis not present

## 2023-05-31 NOTE — Progress Notes (Signed)
Cardiology Office Note:  .   Date:  05/31/2023  ID:  Melvenia Beam, DOB 1946-01-27, MRN [id number] PCP: Lucky Cowboy, MD  [facility] Providers Cardiologist:  Charlton Haws, MD {  History of Present Illness: .   [name] [name] is a 77 y.o. female with a past medical history of PACs/palpitation, hypertension, hyperlipidemia, and smoking history here for follow-up appointment.  Most recently seen in the ER August 30, 2021 for abdominal pain.  Was found to have diverticulitis on CT scan.  Discharged home from the ER on antibiotics.  Was last seen September 28, 2022.  Patient denied chest pain and shortness of breath at that time.  No orthopnea, PND, syncope, lower extremity edema, or melena.  No regular exercise at that time but does go up and down a flight of stairs at home without any issue.  Palpitations have been  very stable on metoprolol.  Today, she has been doing fine heart wise. Vertigo and blindness in the left eye. She was told it was Idiopathic. ENT thought from a virus, went to the ER. Two cat scan two MRIs. Has not happened again thankfully. No chest pain or SOB. Palpitations have been well controlled and she overall feels well today.  Of note her maiden name is "Izell Meridian" !  Reports no shortness of breath nor dyspnea on exertion. Reports no chest pain, pressure, or tightness. No edema, orthopnea, PND. Reports no palpitations.    ROS: Pertinent ROS in HPI  Studies Reviewed: .       Echo 12/20/22 IMPRESSIONS     1. Left ventricular ejection fraction, by estimation, is 60 to 65%. The  left ventricle has normal function. The left ventricle has no regional  wall motion abnormalities. There is moderate left ventricular hypertrophy.  Left ventricular diastolic  parameters are consistent with Grade I diastolic dysfunction (impaired  relaxation). Elevated left ventricular end-diastolic pressure.   2. Right ventricular systolic function is normal. The right  ventricular  size is normal.   3. Left atrial size was mildly dilated.   4. Prolapse of posterior leaflet noted in apical images # 37. The mitral  valve is abnormal. Mild mitral valve regurgitation. No evidence of mitral  stenosis. Moderate mitral annular calcification.   5. The aortic valve is tricuspid. There is mild calcification of the  aortic valve. Aortic valve regurgitation is not visualized. Aortic valve  sclerosis is present, with no evidence of aortic valve stenosis.   6. The inferior vena cava is normal in size with greater than 50%  respiratory variability, suggesting right atrial pressure of 3 mmHg.   FINDINGS   Left Ventricle: Left ventricular ejection fraction, by estimation, is 60  to 65%. The left ventricle has normal function. The left ventricle has no  regional wall motion abnormalities. The left ventricular internal cavity  size was normal in size. There is   moderate left ventricular hypertrophy. Left ventricular diastolic  parameters are consistent with Grade I diastolic dysfunction (impaired  relaxation). Elevated left ventricular end-diastolic pressure.   Right Ventricle: The right ventricular size is normal. No increase in  right ventricular wall thickness. Right ventricular systolic function is  normal.   Left Atrium: Left atrial size was mildly dilated.   Right Atrium: Right atrial size was normal in size.   Pericardium: There is no evidence of pericardial effusion.   Mitral Valve: Prolapse of posterior leaflet noted in apical images # 37.  The mitral valve is abnormal. There is mild thickening  of the mitral valve  leaflet(s). Moderate mitral annular calcification. Mild mitral valve  regurgitation. No evidence of mitral  valve stenosis.   Tricuspid Valve: The tricuspid valve is normal in structure. Tricuspid  valve regurgitation is trivial. No evidence of tricuspid stenosis.   Aortic Valve: The aortic valve is tricuspid. There is mild calcification   of the aortic valve. Aortic valve regurgitation is not visualized. Aortic  valve sclerosis is present, with no evidence of aortic valve stenosis.   Pulmonic Valve: The pulmonic valve was normal in structure. Pulmonic valve  regurgitation is trivial. No evidence of pulmonic stenosis.   Aorta: The aortic root is normal in size and structure.   Venous: The inferior vena cava is normal in size with greater than 50%  respiratory variability, suggesting right atrial pressure of 3 mmHg.   IAS/Shunts: No atrial level shunt detected by color flow Doppler.        Physical Exam:   VS:  BP 132/68   Pulse 76   Ht 5\' 4"  (1.626 m)   Wt 146 lb 3.2 oz (66.3 kg)   SpO2 96%   BMI 25.10 kg/m    Wt Readings from Last 3 Encounters:  05/31/23 146 lb 3.2 oz (66.3 kg)  04/10/23 144 lb (65.3 kg)  03/01/23 150 lb 9.6 oz (68.3 kg)    GEN: Well nourished, well developed in no acute distress NECK: No JVD; No carotid bruits CARDIAC: RRR, no murmurs, rubs, gallops RESPIRATORY:  Clear to auscultation without rales, wheezing or rhonchi  ABDOMEN: Soft, non-tender, non-distended EXTREMITIES:  No edema; No deformity   ASSESSMENT AND PLAN: .   1.  PACs/palpitations -continue metoprolol  -well controlled on current medications  2.  Hypertension -well controlled today -continue low sodium, heart healthy diet -continue current medications  3.  Hyperlipidemia -Continue Lipitor 40mg  TID  4.  Smoking -no smoking since June 8th, 2024 -I congratulated her      Dispo: She can follow-up in 6 months.  Signed, Sharlene Dory, PA-C

## 2023-05-31 NOTE — Patient Instructions (Signed)
Medication Instructions:   Your physician recommends that you continue on your current medications as directed. Please refer to the Current Medication list given to you today.  *If you need a refill on your cardiac medications before your next appointment, please call your pharmacy*   Lab Work: NONE ORDERED  TODAY   If you have labs (blood work) drawn today and your tests are completely normal, you will receive your results only by: MyChart Message (if you have MyChart) OR A paper copy in the mail If you have any lab test that is abnormal or we need to change your treatment, we will call you to review the results.   Testing/Procedures: NONE ORDERED  TODAY   Follow-Up: At [facility], you and your health needs are our priority.  As part of our continuing mission to provide you with exceptional heart care, we have created designated Provider Care Teams.  These Care Teams include your primary Cardiologist (physician) and Advanced Practice Providers (APPs -  Physician Assistants and Nurse Practitioners) who all work together to provide you with the care you need, when you need it.  We recommend signing up for the patient portal called "MyChart".  Sign up information is provided on this After Visit Summary.  MyChart is used to connect with patients for Virtual Visits (Telemedicine).  Patients are able to view lab/test results, encounter notes, upcoming appointments, etc.  Non-urgent messages can be sent to your provider as well.   To learn more about what you can do with MyChart, go to [email or web address].    Your next appointment:   6 month(s)  Provider:   Charlton Haws, MD  Asa Lente   Other Instructions Heart-Healthy Eating Plan Eating a healthy diet is important for the health of your heart. A heart-healthy eating plan includes: Eating less unhealthy fats. Eating more healthy fats. Eating less salt in your food. Salt is also called sodium. Making other changes in your  diet. Talk with your doctor or a diet specialist (dietitian) to create an eating plan that is right for you. What is my plan? Your doctor may recommend an eating plan that includes: Total fat: ______% or less of total calories a day. Saturated fat: ______% or less of total calories a day. Cholesterol: less than _________mg a day. Sodium: less than _________mg a day. What are tips for following this plan? Cooking Avoid frying your food. Try to bake, boil, grill, or broil it instead. You can also reduce fat by: Removing the skin from poultry. Removing all visible fats from meats. Steaming vegetables in water or broth. Meal planning  At meals, divide your plate into four equal parts: Fill one-half of your plate with vegetables and green salads. Fill one-fourth of your plate with whole grains. Fill one-fourth of your plate with lean protein foods. Eat 2-4 cups of vegetables per day. One cup of vegetables is: 1 cup (91 g) broccoli or cauliflower florets. 2 medium carrots. 1 large bell pepper. 1 large sweet potato. 1 large tomato. 1 medium white potato. 2 cups (150 g) raw leafy greens. Eat 1-2 cups of fruit per day. One cup of fruit is: 1 small apple 1 large banana 1 cup (237 g) mixed fruit, 1 large orange,  cup (82 g) dried fruit, 1 cup (240 mL) 100% fruit juice. Eat more foods that have soluble fiber. These are apples, broccoli, carrots, beans, peas, and barley. Try to get 20-30 g of fiber per day. Eat 4-5 servings of nuts, legumes, and seeds  per week: 1 serving of dried beans or legumes equals  cup (90 g) cooked. 1 serving of nuts is  oz (12 almonds, 24 pistachios, or 7 walnut halves). 1 serving of seeds equals  oz (8 g). General information Eat more home-cooked food. Eat less restaurant, buffet, and fast food. Limit or avoid alcohol. Limit foods that are high in starch and sugar. Avoid fried foods. Lose weight if you are overweight. Keep track of how much salt  (sodium) you eat. This is important if you have high blood pressure. Ask your doctor to tell you more about this. Try to add vegetarian meals each week. Fats Choose healthy fats. These include olive oil and canola oil, flaxseeds, walnuts, almonds, and seeds. Eat more omega-3 fats. These include salmon, mackerel, sardines, tuna, flaxseed oil, and ground flaxseeds. Try to eat fish at least 2 times each week. Check food labels. Avoid foods with trans fats or high amounts of saturated fat. Limit saturated fats. These are often found in animal products, such as meats, butter, and cream. These are also found in plant foods, such as palm oil, palm kernel oil, and coconut oil. Avoid foods with partially hydrogenated oils in them. These have trans fats. Examples are stick margarine, some tub margarines, cookies, crackers, and other baked goods. What foods should I eat? Fruits All fresh, canned (in natural juice), or frozen fruits. Vegetables Fresh or frozen vegetables (raw, steamed, roasted, or grilled). Green salads. Grains Most grains. Choose whole wheat and whole grains most of the time. Rice and pasta, including brown rice and pastas made with whole wheat. Meats and other proteins Lean, well-trimmed beef, veal, pork, and lamb. Chicken and [place] without skin. All fish and shellfish. Wild duck, rabbit, pheasant, and venison. Egg whites or low-cholesterol egg substitutes. Dried beans, peas, lentils, and tofu. Seeds and most nuts. Dairy Low-fat or nonfat cheeses, including ricotta and mozzarella. Skim or 1% milk that is liquid, powdered, or evaporated. Buttermilk that is made with low-fat milk. Nonfat or low-fat yogurt. Fats and oils Non-hydrogenated (trans-free) margarines. Vegetable oils, including soybean, sesame, sunflower, olive, peanut, safflower, corn, canola, and cottonseed. Salad dressings or mayonnaise made with a vegetable oil. Beverages Mineral water. Coffee and tea. Diet carbonated  beverages. Sweets and desserts Sherbet, gelatin, and fruit ice. Small amounts of dark chocolate. Limit all sweets and desserts. Seasonings and condiments All seasonings and condiments. The items listed above may not be a complete list of foods and drinks you can eat. Contact a dietitian for more options. What foods should I avoid? Fruits Canned fruit in heavy syrup. Fruit in cream or butter sauce. Fried fruit. Limit coconut. Vegetables Vegetables cooked in cheese, cream, or butter sauce. Fried vegetables. Grains Breads that are made with saturated or trans fats, oils, or whole milk. Croissants. Sweet rolls. Donuts. High-fat crackers, such as cheese crackers. Meats and other proteins Fatty meats, such as hot dogs, ribs, sausage, bacon, rib-eye roast or steak. High-fat deli meats, such as salami and bologna. Caviar. Domestic duck and goose. Organ meats, such as liver. Dairy Cream, sour cream, cream cheese, and creamed cottage cheese. Whole-milk cheeses. Whole or 2% milk that is liquid, evaporated, or condensed. Whole buttermilk. Cream sauce or high-fat cheese sauce. Yogurt that is made from whole milk. Fats and oils Meat fat, or shortening. Cocoa butter, hydrogenated oils, palm oil, coconut oil, palm kernel oil. Solid fats and shortenings, including bacon fat, salt pork, lard, and butter. Nondairy cream substitutes. Salad dressings with cheese or sour cream.  Beverages Regular sodas and juice drinks with added sugar. Sweets and desserts Frosting. Pudding. Cookies. Cakes. Pies. Milk chocolate or white chocolate. Buttered syrups. Full-fat ice cream or ice cream drinks. The items listed above may not be a complete list of foods and drinks to avoid. Contact a dietitian for more information. Summary Heart-healthy meal planning includes eating less unhealthy fats, eating more healthy fats, and making other changes in your diet. Eat a balanced diet. This includes fruits and vegetables, low-fat or  nonfat dairy, lean protein, nuts and legumes, whole grains, and heart-healthy oils and fats. This information is not intended to replace advice given to you by your health care provider. Make sure you discuss any questions you have with your health care provider. Document Revised: 09/20/2021 Document Reviewed: 09/20/2021 Elsevier Patient Education  2024 [employer] Inc[employer]   Low-Sodium Eating Plan Salt (sodium) helps you keep a healthy balance of fluids in your body. Too much sodium can raise your blood pressure. It can also cause fluid and waste to be held in your body. Your health care provider or dietitian may recommend a low-sodium eating plan if you have high blood pressure (hypertension), kidney disease, liver disease, or heart failure. Eating less sodium can help lower your blood pressure and reduce swelling. It can also protect your heart, liver, and kidneys. What are tips for following this plan? Reading food labels  Check food labels for the amount of sodium per serving. If you eat more than one serving, you must multiply the listed amount by the number of servings. Choose foods with less than 140 milligrams (mg) of sodium per serving. Avoid foods with 300 mg of sodium or more per serving. Always check how much sodium is in a product, even if the label says "unsalted" or "no salt added." Shopping  Buy products labeled as "low-sodium" or "no salt added." Buy fresh foods. Avoid canned foods and pre-made or frozen meals. Avoid canned, cured, or processed meats. Buy breads that have less than 80 mg of sodium per slice. Cooking  Eat more home-cooked food. Try to eat less restaurant, buffet, and fast food. Try not to add salt when you cook. Use salt-free seasonings or herbs instead of table salt or sea salt. Check with your provider or pharmacist before using salt substitutes. Cook with plant-based oils, such as canola, sunflower, or olive oil. Meal planning When eating at a restaurant,  ask if your food can be made with less salt or no salt. Avoid dishes labeled as brined, pickled, cured, or smoked. Avoid dishes made with soy sauce, miso, or teriyaki sauce. Avoid foods that have monosodium glutamate (MSG) in them. MSG may be added to some restaurant food, sauces, soups, bouillon, and canned foods. Make meals that can be grilled, baked, poached, roasted, or steamed. These are often made with less sodium. General information Try to limit your sodium intake to 1,500-2,300 mg each day, or the amount told by your provider. What foods should I eat? Fruits Fresh, frozen, or canned fruit. Fruit juice. Vegetables Fresh or frozen vegetables. "No salt added" canned vegetables. "No salt added" tomato sauce and paste. Low-sodium or reduced-sodium tomato and vegetable juice. Grains Low-sodium cereals, such as oats, puffed wheat and rice, and shredded wheat. Low-sodium crackers. Unsalted rice. Unsalted pasta. Low-sodium bread. Whole grain breads and whole grain pasta. Meats and other proteins Fresh or frozen meat, poultry, seafood, and fish. These should have no added salt. Low-sodium canned tuna and salmon. Unsalted nuts. Dried peas, beans, and lentils  without added salt. Unsalted canned beans. Eggs. Unsalted nut butters. Dairy Milk. Soy milk. Cheese that is naturally low in sodium, such as ricotta cheese, fresh mozzarella, or Swiss cheese. Low-sodium or reduced-sodium cheese. Cream cheese. Yogurt. Seasonings and condiments Fresh and dried herbs and spices. Salt-free seasonings. Low-sodium mustard and ketchup. Sodium-free salad dressing. Sodium-free light mayonnaise. Fresh or refrigerated horseradish. Lemon juice. Vinegar. Other foods Homemade, reduced-sodium, or low-sodium soups. Unsalted popcorn and pretzels. Low-salt or salt-free chips. The items listed above may not be all the foods and drinks you can have. Talk to a dietitian to learn more. What foods should I  avoid? Vegetables Sauerkraut, pickled vegetables, and relishes. Olives. [place] fries. Onion rings. Regular canned vegetables, except low-sodium or reduced-sodium items. Regular canned tomato sauce and paste. Regular tomato and vegetable juice. Frozen vegetables in sauces. Grains Instant hot cereals. Bread stuffing, pancake, and biscuit mixes. Croutons. Seasoned rice or pasta mixes. Noodle soup cups. Boxed or frozen macaroni and cheese. Regular salted crackers. Self-rising flour. Meats and other proteins Meat or fish that is salted, canned, smoked, spiced, or pickled. Precooked or cured meat, such as sausages or meat loaves. Tomasa Blase. Ham. Pepperoni. Hot dogs. Corned beef. Chipped beef. Salt pork. Jerky. Pickled herring, anchovies, and sardines. Regular canned tuna. Salted nuts. Dairy Processed cheese and cheese spreads. Hard cheeses. Cheese curds. Blue cheese. Feta cheese. String cheese. Regular cottage cheese. Buttermilk. Canned milk. Fats and oils Salted butter. Regular margarine. Ghee. Bacon fat. Seasonings and condiments Onion salt, garlic salt, seasoned salt, table salt, and sea salt. Canned and packaged gravies. Worcestershire sauce. Tartar sauce. Barbecue sauce. Teriyaki sauce. Soy sauce, including reduced-sodium soy sauce. Steak sauce. Fish sauce. Oyster sauce. Cocktail sauce. Horseradish that you find on the shelf. Regular ketchup and mustard. Meat flavorings and tenderizers. Bouillon cubes. Hot sauce. Pre-made or packaged marinades. Pre-made or packaged taco seasonings. Relishes. Regular salad dressings. Salsa. Other foods Salted popcorn and pretzels. Corn chips and puffs. Potato and tortilla chips. Canned or dried soups. Pizza. Frozen entrees and pot pies. The items listed above may not be all the foods and drinks you should avoid. Talk to a dietitian to learn more. This information is not intended to replace advice given to you by your health care provider. Make sure you discuss any  questions you have with your health care provider. Document Revised: 09/01/2022 Document Reviewed: 09/01/2022 Elsevier Patient Education  2024 [employer].

## 2023-06-05 ENCOUNTER — Encounter: Payer: Self-pay | Admitting: Internal Medicine

## 2023-06-12 ENCOUNTER — Emergency Department (HOSPITAL_COMMUNITY): Payer: PPO

## 2023-06-12 ENCOUNTER — Encounter (HOSPITAL_COMMUNITY): Payer: Self-pay

## 2023-06-12 ENCOUNTER — Emergency Department (HOSPITAL_COMMUNITY)
Admission: EM | Admit: 2023-06-12 | Discharge: 2023-06-12 | Disposition: A | Discharge status: Home / Self Care | Payer: PPO | Attending: Emergency Medicine | Admitting: Emergency Medicine

## 2023-06-12 DIAGNOSIS — G4489 Other headache syndrome: Secondary | ICD-10-CM | POA: Diagnosis not present

## 2023-06-12 DIAGNOSIS — R413 Other amnesia: Secondary | ICD-10-CM | POA: Insufficient documentation

## 2023-06-12 DIAGNOSIS — I10 Essential (primary) hypertension: Secondary | ICD-10-CM | POA: Diagnosis not present

## 2023-06-12 DIAGNOSIS — R519 Headache, unspecified: Secondary | ICD-10-CM | POA: Diagnosis not present

## 2023-06-12 DIAGNOSIS — R410 Disorientation, unspecified: Secondary | ICD-10-CM | POA: Diagnosis present

## 2023-06-12 LAB — COMPREHENSIVE METABOLIC PANEL
ALT: 22 U/L (ref 0–44)
AST: 21 U/L (ref 15–41)
Albumin: 4 g/dL (ref 3.5–5.0)
Alkaline Phosphatase: 59 U/L (ref 38–126)
Anion gap: 10 (ref 5–15)
BUN: 12 mg/dL (ref 8–23)
CO2: 21 mmol/L — ABNORMAL LOW (ref 22–32)
Calcium: 9.5 mg/dL (ref 8.9–10.3)
Chloride: 107 mmol/L (ref 98–111)
Creatinine, Ser: 0.82 mg/dL (ref 0.44–1.00)
GFR, Estimated: >60 mL/min (ref >60)
Glucose, Bld: 93 mg/dL (ref 70–99)
Potassium: 3.9 mmol/L (ref 3.5–5.1)
Sodium: 138 mmol/L (ref 135–145)
Total Bilirubin: 0.5 mg/dL (ref 0.3–1.2)
Total Protein: 6.8 g/dL (ref 6.5–8.1)

## 2023-06-12 LAB — CBC
HCT: 37.4 % (ref 36.0–46.0)
Hemoglobin: 12.5 g/dL (ref 12.0–15.0)
MCH: 31.9 pg (ref 26.0–34.0)
MCHC: 33.4 g/dL (ref 30.0–36.0)
MCV: 95.4 fL (ref 80.0–100.0)
Platelets: 245 10*3/uL (ref 150–400)
RBC: 3.92 MIL/uL (ref 3.87–5.11)
RDW: 13 % (ref 11.5–15.5)
WBC: 8.7 10*3/uL (ref 4.0–10.5)
nRBC: 0 % (ref 0.0–0.2)

## 2023-06-12 NOTE — ED Triage Notes (Signed)
Pt had headache that started at 1300 today that was accompanied with reported memory loss in which she forgot family was coming over. She is alert to self no neuro deficits, no recent falls or trauma. And no on blood thinners.   Medic vitals   124/86 77hr 100%ra 104bgl

## 2023-06-12 NOTE — ED Provider Notes (Signed)
[place] EMERGENCY DEPARTMENT AT [facility] Provider Note   CSN: [id number] Arrival date & time: 06/12/23  1508     History  Chief Complaint  Patient presents with   Headache    [name] [name] is a 77 y.o. female.  HPI 77 year old female presents with transient confusion.  She states she has been preparing for family to come tomorrow for the past week or so.  Today at around 1:15 PM she was with her husband and forgot that they were coming tomorrow and seemed convinced they were not coming tomorrow.  He was adamant they were and then she started developing a mild headache.  She estimates this confusion about their arrival date lasted a couple hours.  She feels like she is back to normal now but is confused as to why this even happened.  She had no other symptoms including no visual complaints, weakness or numbness in her extremities, speech abnormalities, or confusion about anything else or difficulty recognizing anything else such as her husband.  Home Medications Prior to Admission medications   Medication Sig Start Date End Date Taking? Authorizing Provider  Ascorbic Acid (VITAMIN C) 500 MG CAPS Take by mouth.    [provider]  atorvastatin (LIPITOR) 40 MG tablet Take 1 tablet 3 x /week for Cholesterol                                           /                              TAKE                             BY                   MOUTH 04/02/23   Lucky Cowboy, MD  B Complex Vitamins (B COMPLEX PO) Take 1 tablet by mouth every other day.     [provider]  Cholecalciferol (VITAMIN D3) 5000 UNITS TABS Take 5,000 Units by mouth 2 (two) times daily.    [provider]  clindamycin (CLEOCIN T) 1 % external solution Apply 1 Application topically 2 (two) times daily. Pt directed to use for a period of 2 weeks. 05/30/23   [provider]  Magnesium 250 MG TABS Take 250 mg by mouth daily.     [provider]  metoprolol tartrate  (LOPRESSOR) 25 MG tablet TAKE 1 TABLET BY MOUTH DAILY 10/24/22   Wendall Stade, MD  MYRBETRIQ 50 MG TB24 tablet Take 50 mg by mouth daily. 05/22/23   [provider]  sertraline (ZOLOFT) 100 MG tablet Take   1 tablet   Daily   for Mood                                                                                                       /  TAKE                                         BY                                                 MOUTH 03/05/23   Lucky Cowboy, MD      Allergies    Bactrim [sulfamethoxazole-trimethoprim] and Ketek [telithromycin]    Review of Systems   Review of Systems  Neurological:  Positive for headaches. Negative for speech difficulty, weakness and numbness.  Psychiatric/Behavioral:  Positive for confusion.     Physical Exam Updated Vital Signs BP (!) 156/70 (BP Location: Left Arm)   Pulse 97   Temp 98.8 F (37.1 C) (Oral)   Resp 16   SpO2 97%  Physical Exam Vitals and nursing note reviewed.  Constitutional:      General: She is not in acute distress.    Appearance: She is well-developed. She is not ill-appearing or diaphoretic.  HENT:     Head: Normocephalic and atraumatic.  Cardiovascular:     Rate and Rhythm: Normal rate and regular rhythm.     Heart sounds: Normal heart sounds.  Pulmonary:     Effort: Pulmonary effort is normal.     Breath sounds: Normal breath sounds.  Abdominal:     Palpations: Abdomen is soft.     Tenderness: There is no abdominal tenderness.  Musculoskeletal:     Cervical back: No rigidity.  Skin:    General: Skin is warm and dry.  Neurological:     Mental Status: She is alert and oriented to person, place, and time.     Comments: CN 3-12 grossly intact. 5/5 strength in all 4 extremities. Grossly normal sensation. Normal finger to nose.      ED Results / Procedures / Treatments   Labs (all labs ordered are listed, but only abnormal results are  displayed) Labs Reviewed  COMPREHENSIVE METABOLIC PANEL - Abnormal; Notable for the following components:      Result Value   CO2 21 (*)    All other components within normal limits  CBC    EKG None  Radiology CT Head Wo Contrast  Result Date: 06/12/2023 CLINICAL DATA:  Headache EXAM: CT HEAD WITHOUT CONTRAST TECHNIQUE: Contiguous axial images were obtained from the base of the skull through the vertex without intravenous contrast. RADIATION DOSE REDUCTION: This exam was performed according to the departmental dose-optimization program which includes automated exposure control, adjustment of the mA and/or kV according to patient size and/or use of iterative reconstruction technique. COMPARISON:  Head CT 02/04/2023 FINDINGS: Brain: No evidence of acute infarction, hemorrhage, hydrocephalus, extra-axial collection or mass lesion/mass effect. Vascular: No hyperdense vessel or unexpected calcification. Skull: Normal. Negative for fracture or focal lesion. Sinuses/Orbits: No acute finding. Other: None. IMPRESSION: No acute intracranial abnormality. Electronically Signed   By: Darliss Cheney M.D.   On: 06/12/2023 20:10    Procedures Procedures    Medications Ordered in ED Medications - No data to display  ED Course/ Medical Decision Making/ A&P  Medical Decision Making Amount and/or Complexity of Data Reviewed Labs:     Details: Normal WBC Radiology: independent interpretation performed.    Details: No head bleed   It is unclear what caused the patient's temporary confusion about when her family was coming.  However given this was the only abnormality noted, it seems unlikely this was a acute neuro emergency such as stroke.  Does not sound like transient global amnesia.  Currently has no symptoms besides mild headache.  Highly doubt subarachnoid hemorrhage, infection, etc.  Otherwise, she feels well enough for discharge.  I do not think an MRI is  indicated.  Will discharge home with return precautions.        Final Clinical Impression(s) / ED Diagnoses Final diagnoses:  Memory difficulty    Rx / DC Orders ED Discharge Orders     None         Pricilla Loveless, MD 06/12/23 2023

## 2023-06-12 NOTE — Discharge Instructions (Signed)
If you develop continued, recurrent, or worsening headache, fever, neck stiffness, vomiting, blurry or double vision, weakness or numbness in your arms or legs, trouble speaking, or any other new/concerning symptoms then return to the ER for evaluation.

## 2023-06-26 DIAGNOSIS — Z1231 Encounter for screening mammogram for malignant neoplasm of breast: Secondary | ICD-10-CM | POA: Diagnosis not present

## 2023-06-26 LAB — HM MAMMOGRAPHY

## 2023-06-27 ENCOUNTER — Encounter: Payer: Self-pay | Admitting: Internal Medicine

## 2023-07-03 ENCOUNTER — Encounter: Payer: Self-pay | Admitting: Internal Medicine

## 2023-08-15 NOTE — Progress Notes (Unsigned)
COMPLETE PHYSICAL  Assessment:   Encounter for routine medical examination with abnormal findings Schedule mammogram, phone number given Due annually   Mixed hyperlipidemia Continue statin -continue medications, check lipids, decrease fatty foods, increase activity.   Tachycardia Taking metoprolol for rate control  Atherosclerosis of aorta Per CT 05/19/20 Control blood pressure, cholesterol, glucose, increase exercise.   Emphysema(HCC) STOP SMOKING, uses inhaler PRN with flares, rare  Current smoker on some days Getting annual CT lung cancer screening, done 05/2022  negative repeat 1 year Smoking cessation- 3-5 min instruction/counseling given, counseled patient on the dangers of tobacco use, advised patient to stop smoking, and reviewed strategies to maximize success, patient not ready to quit at this time.   Hx of prediabetes / abnormal glucose Discussed general issues about diabetes pathophysiology and management., Educational material distributed., Suggested low cholesterol diet., Encouraged aerobic exercise., Discussed foot care., Reminded to get yearly retinal exam.   Vitamin D deficiency Continue supplement  Psoriatic arthritis/Cervical arthritis (HCC) Dr. Dierdre Forth following, now on methotrexate    Diverticulosus of colon  Bowel management; add fiber if not taking   Mitral valve disorder No SOB, CP, edema, monitor  Osteopenia Continue weight bearing exercises, Vit D and calcium supplementation  Medication management Continued  Depression(HCC)  stress management techniques discussed, increase water, good sleep hygiene discussed, increase exercise, and increase veggies.  - will continue zoloft for now, successfully tapered off of benzo  Anemia CBC  Psoriasis Follows with derm  Overweight - BMI 25 Long discussion about weight loss, diet, and exercise Recommended diet heavy in fruits and veggies and low in animal meats, cheeses, and dairy products,  appropriate calorie intake Patient will work on decrease simple carbs, saturated fats and increase activity Follow up at next visit  Screening for ischemic heart disease - EKG  Screening for AAA - U/S ABD Retroperitoneal LTD  Screening for hematuria/proteinuria - UA routine with reflex culture - Microalbumin/creatinine urine ration  Screening for thyroid disorder - TSH   Continue diet and meds as discussed. Further disposition pending results of labs. Discussed med's effects and SE's.   Over 30 minutes of exam, counseling, chart review, and critical decision making was performed.  Future Appointments  Date Time Provider Department Center  08/16/2023  2:00 PM Raynelle Dick, NP [facility] None  02/29/2024  2:30 PM Raynelle Dick, NP [facility] None  08/15/2024  2:00 PM Raynelle Dick, NP [facility] None     Subjective:   [name] [name] is a 77 y.o. female who presents for complete physical and follow up for HTN, chol, preDM, and depression.   She is still doing rescue work with water fowl.    She is no longer taking the Myrbetriq and is doing Kegels exercises and the urine leakage is better controlled now.   She is sleeping well and states her mood is good. She is currently on Zoloft 100 mg QD.   She had cataract surgery bilaterally 06/10/22 and 07/02/22, she also had retina surgery for floater in right eye. Vision is much better.   She smokes 1 pack of cigarettes/1-2 weeks - on the weekend with a bottle of wine.  Has emphysema by imaging, asymptomatic, no perceived benefit with inhalers Hx of 30+ pack year history, had CT lung 04/2020 with small nodule recommended for annual screening. Follow up 06/24/22 negative with repeat due in 1 year  Psoriatic arthritis/cervical arthritis, followed by Dr. Dierdre Forth. Previously used Methotrexate but got nausea and diarrhea so stopped medication. Arthritis is  managed at this time.  BMI is There is no height or weight on file  to calculate BMI., she has not been working on diet and exercise. She has been eating more sweets and soda, has not been exercising Wt Readings from Last 3 Encounters:  05/31/23 146 lb 3.2 oz (66.3 kg)  04/10/23 144 lb (65.3 kg)  03/01/23 150 lb 9.6 oz (68.3 kg)   She has aortic atherosclerosis per CT 08/2018 Follows with Dr. 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  She denies chest pain, shortness of breath, dizziness.    She is on cholesterol medication (atorvastatin 40 mg three times/ week) and denies myalgias. Her cholesterol is at goal. The cholesterol last visit was:   Lab Results  Component Value Date   CHOL 201 (H) 03/01/2023   HDL 77 03/01/2023   LDLCALC 105 (H) 03/01/2023   TRIG 91 03/01/2023   CHOLHDL 2.6 03/01/2023   She has been working on diet and exercise for hx of prediabetes recently well controlled, and denies paresthesia of the feet, polydipsia, polyuria and visual disturbances. Last A1C in the office was:  Lab Results  Component Value Date   HGBA1C 5.9 (H) 03/01/2023    Lab Results  Component Value Date   EGFR 86 03/01/2023    Patient is on Vitamin D supplement. Lab Results  Component Value Date   VD25OH 76 08/15/2022   DEXA and mammogram are UTD.  Dexa continues to show osteopenia for which she is doing weight bearing exercises and VIT D and calcium.   Medication Review Current Outpatient Medications on File Prior to Visit  Medication Sig Dispense Refill   Ascorbic Acid (VITAMIN C) 500 MG CAPS Take by mouth.     atorvastatin (LIPITOR) 40 MG tablet Take 1 tablet 3 x /week for Cholesterol                                           /                              TAKE                              BY                   MOUTH 39 tablet 3   B Complex Vitamins (B COMPLEX PO) Take 1 tablet by mouth every other day.      Cholecalciferol (VITAMIN D3) 5000 UNITS TABS Take 5,000 Units by mouth 2 (two) times daily.     clindamycin (CLEOCIN T) 1 % external solution Apply 1 Application topically 2 (two) times daily. Pt directed to use for a period of 2 weeks.     Magnesium 250 MG TABS Take 250 mg by mouth daily.      metoprolol tartrate (LOPRESSOR) 25 MG tablet TAKE 1 TABLET BY MOUTH DAILY 90 tablet 3   MYRBETRIQ 50 MG TB24 tablet Take 50 mg by mouth daily.  sertraline (ZOLOFT) 100 MG tablet Take   1 tablet   Daily   for Mood                                                                                                       /                                                                   TAKE                                         BY                                                 MOUTH 90 tablet 3   No current facility-administered medications on file prior to visit.    Current Problems (verified) Patient Active Problem List   Diagnosis Date Noted   Anemia 05/06/2020   Current smoker on some days 04/25/2019   Depression, major, recurrent, in partial remission (HCC) 07/31/2018   Pulmonary emphysema (HCC) 06/13/2017   Aortic atherosclerosis (HCC) by CT Scan in 04/2020 06/13/2017   Psoriatic arthritis (HCC) 05/25/2016   Encounter for Medicare annual wellness exam 04/27/2015   Cervical arthritis (HCC) 10/10/2014   Medication management 10/02/2014   Mixed hyperlipidemia 07/01/2014   Hypertension    Other abnormal glucose    Vitamin D deficiency    Mitral valve disorder 06/16/2009   Diverticulosis of colon 05/06/2008    Screening Tests Immunization History  Administered Date(s) Administered   DT (Pediatric) 04/27/2015   Influenza, High Dose Seasonal PF 07/10/2019, 08/12/2020, 07/19/2021, 05/31/2022   PFIZER(Purple Top)SARS-COV-2 Vaccination 10/11/2019, 11/03/2019, 06/05/2020    Pneumococcal Conjugate-13 04/27/2015   Pneumococcal Polysaccharide-23 12/22/2004, 11/24/2016   Rabies, IM 05/04/2016, 05/07/2016   Td 12/16/2003   Preventative care: Last colonoscopy: 12/2016, 5 year follow up, 2023 Due EGD 02/2015 Last mammogram:06/20/22  Last pap smear/pelvic exam: 11/2012 , 1 abnormal in her 20's, declines another DEXA:06/20/22 osteopenia Echo 2009 normal EF Ct AB 11/2014 Ct chest: 08/2018, small R nodule, follow up ordered  CXR: 2018 MRI abd: 08/2018 - benign renal cyst   Prior vaccinations: TD or Tdap: 2016 Influenza: 08/05/2021 Pneumococcal: 2018 Prevnar 13: 2016 Shingles/Zostavax: declines Covid 19: 2/2, 2021, pfizer , Booster 06/05/2020  Names of Other Physician/Practitioners you currently use: 1. [facility]- here for primary care 2. Dr. Hyacinth Meeker, eye doctor, 2023 3. Dr. Laveda Norman, dentist, last visit 2023 4. Dr. Sharyn Lull, derm, last visit 2022, goes annually  Patient Care Team: Lucky Cowboy, MD as PCP - General (Internal Medicine) Eden Emms,  [name], MD as PCP - Cardiology (Cardiology) Francena Hanly, MD as Consulting Physician (Orthopedic Surgery) Bradly Bienenstock, MD as Consulting Physician (Orthopedic Surgery) Keturah Barre, MD as Consulting Physician (Otolaryngology) Wendall Stade, MD as Consulting Physician (Cardiology) Barron Alvine, MD (Inactive) as Consulting Physician (Urology) Fontaine, Nadyne Coombes, MD (Inactive) as Consulting Physician (Gynecology) Haverstock, Elvin So, MD as Referring Physician (Dermatology) Blima Ledger, OD (Optometry) Lesleigh Noe , DDS Sundra Aland, [facility] (Inactive) as Pharmacist (Pharmacist) Elwyn Reach (Neurology)   Allergies Allergies  Allergen Reactions   Bactrim [Sulfamethoxazole-Trimethoprim] Swelling    Face and eyes swollen and red.   Ketek [Telithromycin]     Unknown    SURGICAL HISTORY She  has a past surgical history that includes Tympanoplasty  (Right); Rotator cuff repair (Left); Ectopic pregnancy surgery (1971, 1979); Hemorrhoid surgery; Oophorectomy; Gynecologic cryosurgery (1976); Abdominal hysterectomy (2000); Rotator cuff repair (Right, 2/15); Colonoscopy; Esophagogastroduodenoscopy; Upper gastrointestinal endoscopy; Ganglion cyst excision (Right); Finger surgery; Gastrocnemius Recession (Left, 06/25/2020); Calcaneal osteotomy (Left, 06/25/2020); Tenolysis (Left, 06/25/2020); and Foot arthrodesis (Left, 06/25/2020). FAMILY HISTORY Her family history includes Cirrhosis in her father; Colon cancer in her maternal grandmother; Colon polyps in her sister; Diabetes in her father and mother; Heart disease in her father, maternal grandfather, paternal grandfather, and paternal grandmother; Hyperlipidemia in her father; Hypertension in her father and mother; Lymphoma in her maternal grandmother; Melanoma in her mother; Stomach cancer in her maternal grandmother; Uterine cancer in her sister. SOCIAL HISTORY She  reports that she has been smoking cigarettes. She started smoking about 58 years ago. She has a 32.9 pack-year smoking history. She has never used smokeless tobacco. She reports current alcohol use. She reports that she does not use drugs.  Review of Systems  Constitutional:  Negative for chills and fever.  HENT:  Negative for congestion, hearing loss, sinus pain, sore throat and tinnitus.   Eyes:  Negative for blurred vision and double vision.  Respiratory:  Negative for cough, hemoptysis, sputum production, shortness of breath and wheezing.   Cardiovascular:  Negative for chest pain, palpitations and leg swelling.  Gastrointestinal:  Negative for abdominal pain, constipation, diarrhea, heartburn, nausea and vomiting.  Genitourinary:  Positive for frequency (Myrbetriq is helping). Negative for dysuria and urgency.  Musculoskeletal:  Negative for back pain, falls, joint pain, myalgias and neck pain.  Skin:  Negative for rash.   Neurological:  Negative for dizziness, tingling, tremors, weakness and headaches.  Endo/Heme/Allergies:  Does not bruise/bleed easily.  Psychiatric/Behavioral:  Negative for depression and suicidal ideas. The patient is not nervous/anxious and does not have insomnia.     Objective:   There were no vitals taken for this visit. There is no height or weight on file to calculate BMI.  General appearance: alert, no distress, WD/WN,  female HEENT: normocephalic, sclerae anicteric, TMs pearly, nares patent, no discharge or erythema, pharynx normal, right inner nostril at septum with small tender nodule, non fluctuant  Oral cavity: MMM, has a mucocele on roof of mouth , followed by dentist Neck: supple, no lymphadenopathy, no thyromegaly, no masses Heart: RRR, bradycardia, no murmurs, clicks, rubs, gallop, normal S1, S2 Lungs: CTA bilaterally, no wheezes, rhonchi, or rales Abdomen: +bs, soft, non tender, no hepatomegaly, no splenomegaly Musculoskeletal:  full ROM without laxity or pain; non-antalgic gait Extremities:  no cyanosis, no clubbing Pulses: 2+ symmetric, upper and lower extremities, normal cap refill Derm: warm, dry;no rashes or lesions Neurological: alert, oriented x 3, CN2-12 intact, strength normal upper extremities and lower  extremities, sensation normal throughout, DTRs 2+ throughout, no cerebellar signs, gait normal Psychiatric: normal affect, behavior normal, pleasant  Pelvic: defer  EKG: NSR, no ST changes AAA: < 3 cm   [facility]  08/15/2023

## 2023-08-16 ENCOUNTER — Ambulatory Visit (INDEPENDENT_AMBULATORY_CARE_PROVIDER_SITE_OTHER): Payer: PPO | Admitting: Nurse Practitioner

## 2023-08-16 ENCOUNTER — Encounter: Payer: Self-pay | Admitting: Nurse Practitioner

## 2023-08-16 VITALS — BP 124/68 | HR 73 | Temp 97.7°F | Ht 63.0 in | Wt 145.6 lb

## 2023-08-16 DIAGNOSIS — R7309 Other abnormal glucose: Secondary | ICD-10-CM | POA: Diagnosis not present

## 2023-08-16 DIAGNOSIS — E782 Mixed hyperlipidemia: Secondary | ICD-10-CM

## 2023-08-16 DIAGNOSIS — E559 Vitamin D deficiency, unspecified: Secondary | ICD-10-CM

## 2023-08-16 DIAGNOSIS — E663 Overweight: Secondary | ICD-10-CM

## 2023-08-16 DIAGNOSIS — F3341 Major depressive disorder, recurrent, in partial remission: Secondary | ICD-10-CM

## 2023-08-16 DIAGNOSIS — D649 Anemia, unspecified: Secondary | ICD-10-CM

## 2023-08-16 DIAGNOSIS — I059 Rheumatic mitral valve disease, unspecified: Secondary | ICD-10-CM

## 2023-08-16 DIAGNOSIS — Z1329 Encounter for screening for other suspected endocrine disorder: Secondary | ICD-10-CM

## 2023-08-16 DIAGNOSIS — Z23 Encounter for immunization: Secondary | ICD-10-CM | POA: Diagnosis not present

## 2023-08-16 DIAGNOSIS — F17200 Nicotine dependence, unspecified, uncomplicated: Secondary | ICD-10-CM

## 2023-08-16 DIAGNOSIS — I700 Atherosclerosis of aorta: Secondary | ICD-10-CM | POA: Diagnosis not present

## 2023-08-16 DIAGNOSIS — Z136 Encounter for screening for cardiovascular disorders: Secondary | ICD-10-CM | POA: Diagnosis not present

## 2023-08-16 DIAGNOSIS — Z79899 Other long term (current) drug therapy: Secondary | ICD-10-CM | POA: Diagnosis not present

## 2023-08-16 DIAGNOSIS — M8580 Other specified disorders of bone density and structure, unspecified site: Secondary | ICD-10-CM

## 2023-08-16 DIAGNOSIS — R000 Tachycardia, unspecified: Secondary | ICD-10-CM

## 2023-08-16 DIAGNOSIS — M47812 Spondylosis without myelopathy or radiculopathy, cervical region: Secondary | ICD-10-CM

## 2023-08-16 DIAGNOSIS — Z0000 Encounter for general adult medical examination without abnormal findings: Secondary | ICD-10-CM

## 2023-08-16 DIAGNOSIS — Z1389 Encounter for screening for other disorder: Secondary | ICD-10-CM

## 2023-08-16 DIAGNOSIS — L4050 Arthropathic psoriasis, unspecified: Secondary | ICD-10-CM

## 2023-08-16 DIAGNOSIS — I10 Essential (primary) hypertension: Secondary | ICD-10-CM | POA: Diagnosis not present

## 2023-08-16 DIAGNOSIS — L409 Psoriasis, unspecified: Secondary | ICD-10-CM

## 2023-08-16 DIAGNOSIS — K5730 Diverticulosis of large intestine without perforation or abscess without bleeding: Secondary | ICD-10-CM

## 2023-08-16 DIAGNOSIS — Z0001 Encounter for general adult medical examination with abnormal findings: Secondary | ICD-10-CM

## 2023-08-16 DIAGNOSIS — J439 Emphysema, unspecified: Secondary | ICD-10-CM

## 2023-08-16 NOTE — Patient Instructions (Signed)

## 2023-08-17 LAB — URINALYSIS, ROUTINE W REFLEX MICROSCOPIC
Bacteria, UA: NONE SEEN /[HPF]
Bilirubin Urine: NEGATIVE
Glucose, UA: NEGATIVE
Hyaline Cast: NONE SEEN /[LPF]
Ketones, ur: NEGATIVE
Leukocytes,Ua: NEGATIVE
Nitrite: NEGATIVE
Protein, ur: NEGATIVE
Specific Gravity, Urine: 1.013 (ref 1.001–1.035)
Squamous Epithelial / HPF: NONE SEEN /[HPF] (ref <=5)
WBC, UA: NONE SEEN /[HPF] (ref 0–5)
pH: 5.5 (ref 5.0–8.0)

## 2023-08-17 LAB — CBC WITH DIFFERENTIAL/PLATELET
Absolute Lymphocytes: 2244 {cells}/uL (ref 850–3900)
Absolute Monocytes: 442 {cells}/uL (ref 200–950)
Basophils Absolute: 32 {cells}/uL (ref 0–200)
Basophils Relative: 0.4 %
Eosinophils Absolute: 213 {cells}/uL (ref 15–500)
Eosinophils Relative: 2.7 %
HCT: 39 % (ref 35.0–45.0)
Hemoglobin: 12.9 g/dL (ref 11.7–15.5)
MCH: 31.5 pg (ref 27.0–33.0)
MCHC: 33.1 g/dL (ref 32.0–36.0)
MCV: 95.1 fL (ref 80.0–100.0)
MPV: 9.6 fL (ref 7.5–12.5)
Monocytes Relative: 5.6 %
Neutro Abs: 4969 {cells}/uL (ref 1500–7800)
Neutrophils Relative %: 62.9 %
Platelets: 282 10*3/uL (ref 140–400)
RBC: 4.1 10*6/uL (ref 3.80–5.10)
RDW: 12.5 % (ref 11.0–15.0)
Total Lymphocyte: 28.4 %
WBC: 7.9 10*3/uL (ref 3.8–10.8)

## 2023-08-17 LAB — LIPID PANEL
Cholesterol: 194 mg/dL (ref <200)
HDL: 81 mg/dL (ref >=50)
LDL Cholesterol (Calc): 95 mg/dL
Non-HDL Cholesterol (Calc): 113 mg/dL (ref <130)
Total CHOL/HDL Ratio: 2.4 (calc) (ref <5.0)
Triglycerides: 89 mg/dL (ref <150)

## 2023-08-17 LAB — COMPLETE METABOLIC PANEL WITH GFR
AG Ratio: 1.5 (calc) (ref 1.0–2.5)
ALT: 10 U/L (ref 6–29)
AST: 12 U/L (ref 10–35)
Albumin: 4.3 g/dL (ref 3.6–5.1)
Alkaline phosphatase (APISO): 75 U/L (ref 37–153)
BUN: 17 mg/dL (ref 7–25)
CO2: 29 mmol/L (ref 20–32)
Calcium: 10.1 mg/dL (ref 8.6–10.4)
Chloride: 103 mmol/L (ref 98–110)
Creat: 0.81 mg/dL (ref 0.60–1.00)
Globulin: 2.9 g/dL (ref 1.9–3.7)
Glucose, Bld: 90 mg/dL (ref 65–99)
Potassium: 5.2 mmol/L (ref 3.5–5.3)
Sodium: 140 mmol/L (ref 135–146)
Total Bilirubin: 0.5 mg/dL (ref 0.2–1.2)
Total Protein: 7.2 g/dL (ref 6.1–8.1)
eGFR: 75 mL/min/{1.73_m2} (ref >=60)

## 2023-08-17 LAB — TSH: TSH: 1.89 m[IU]/L (ref 0.40–4.50)

## 2023-08-17 LAB — MICROALBUMIN / CREATININE URINE RATIO
Creatinine, Urine: 63 mg/dL (ref 20–275)
Microalb Creat Ratio: 3 mg/g{creat} (ref <30)
Microalb, Ur: 0.2 mg/dL

## 2023-08-17 LAB — MICROSCOPIC MESSAGE

## 2023-08-17 LAB — VITAMIN D 25 HYDROXY (VIT D DEFICIENCY, FRACTURES): Vit D, 25-Hydroxy: 64 ng/mL (ref 30–100)

## 2023-08-17 LAB — MAGNESIUM: Magnesium: 2.1 mg/dL (ref 1.5–2.5)

## 2023-08-17 LAB — HEMOGLOBIN A1C W/OUT EAG: Hgb A1c MFr Bld: 5.8 %{Hb} — ABNORMAL HIGH (ref <5.7)

## 2023-08-28 ENCOUNTER — Ambulatory Visit
Admission: RE | Admit: 2023-08-28 | Discharge: 2023-08-28 | Disposition: A | Discharge status: Home / Self Care | Payer: PPO | Source: Ambulatory Visit | Attending: Nurse Practitioner | Admitting: Nurse Practitioner

## 2023-08-28 DIAGNOSIS — Z87891 Personal history of nicotine dependence: Secondary | ICD-10-CM | POA: Diagnosis not present

## 2023-08-28 DIAGNOSIS — F17200 Nicotine dependence, unspecified, uncomplicated: Secondary | ICD-10-CM

## 2023-08-31 DIAGNOSIS — E663 Overweight: Secondary | ICD-10-CM | POA: Diagnosis not present

## 2023-08-31 DIAGNOSIS — L409 Psoriasis, unspecified: Secondary | ICD-10-CM | POA: Diagnosis not present

## 2023-08-31 DIAGNOSIS — M25572 Pain in left ankle and joints of left foot: Secondary | ICD-10-CM | POA: Diagnosis not present

## 2023-08-31 DIAGNOSIS — L4059 Other psoriatic arthropathy: Secondary | ICD-10-CM | POA: Diagnosis not present

## 2023-08-31 DIAGNOSIS — Z6825 Body mass index (BMI) 25.0-25.9, adult: Secondary | ICD-10-CM | POA: Diagnosis not present

## 2023-09-01 DIAGNOSIS — M25551 Pain in right hip: Secondary | ICD-10-CM | POA: Diagnosis not present

## 2023-10-26 ENCOUNTER — Other Ambulatory Visit: Payer: Self-pay | Admitting: Cardiovascular Disease

## 2023-11-28 ENCOUNTER — Encounter: Payer: Self-pay | Admitting: Student in an Organized Health Care Education/Training Program

## 2023-11-28 ENCOUNTER — Ambulatory Visit (INDEPENDENT_AMBULATORY_CARE_PROVIDER_SITE_OTHER): Admitting: Student in an Organized Health Care Education/Training Program

## 2023-11-28 VITALS — BP 114/72 | HR 66 | Temp 98.5°F | Wt 146.0 lb

## 2023-11-28 DIAGNOSIS — F3341 Major depressive disorder, recurrent, in partial remission: Secondary | ICD-10-CM

## 2023-11-28 DIAGNOSIS — R002 Palpitations: Secondary | ICD-10-CM | POA: Insufficient documentation

## 2023-11-28 DIAGNOSIS — E782 Mixed hyperlipidemia: Secondary | ICD-10-CM

## 2023-11-28 DIAGNOSIS — I700 Atherosclerosis of aorta: Secondary | ICD-10-CM

## 2023-11-28 DIAGNOSIS — Z720 Tobacco use: Secondary | ICD-10-CM

## 2023-11-28 MED ORDER — SERTRALINE HCL 100 MG PO TABS: 100.0000 mg | ORAL_TABLET | Freq: Every day | ORAL | 3 refills | Status: AC

## 2023-11-28 MED ORDER — ATORVASTATIN CALCIUM 40 MG PO TABS: 40.0000 mg | ORAL_TABLET | ORAL | 3 refills | Status: DC

## 2023-11-28 MED ORDER — METOPROLOL SUCCINATE ER 25 MG PO TB24: 25.0000 mg | ORAL_TABLET | Freq: Every day | ORAL | 3 refills | Status: AC

## 2023-11-28 NOTE — Assessment & Plan Note (Signed)
 Chronic and stable.  Mood is doing okay, will plan to continue sertraline 100 mg daily.  She has some significant caregiver stress, taking care of her husband who has some memory impairments.

## 2023-11-28 NOTE — Assessment & Plan Note (Signed)
 Chronic and stable issue.  Ongoing tobacco use about 5 days weekly.  Cigarette use seems to be pretty minimal at this point.  She has some signs of pulmonary emphysema on imaging but clinically does not bother her.  She has been going through a lung cancer screening protocol with annual CT chest.  Last was done in December and was low risk.  Will plan to do another CT scan annually.

## 2023-11-28 NOTE — Assessment & Plan Note (Signed)
 Chronic and stable issue.  No ischemic vascular events.  She does have one-vessel coronary artery calcifications on CT imaging as well as some atherosclerosis of the aorta.  Last lipids in December showed an LDL of 95.  I think goal for her is reasonable with an LDL less than 100.  She can only tolerate atorvastatin 40 mg 3 times weekly which is reasonable.  I refilled this medication.  Will plan to check lipids annually.

## 2023-11-28 NOTE — Progress Notes (Signed)
 New Patient Office Visit  Subjective    Patient ID: EMELEE [name], female    DOB: 19-Jun-1946  Age: 78 y.o. MRN: [id number]  CC:  Chief Complaint  Patient presents with   Establish Care    No concerns patient states    HPI  [name] [name] presents to establish care  78 year old person here for management of hyperlipidemia and mild depression.  Doing very well and has no acute concerns today.  Previously cared for by Dr. Oneta Rack.  Patient lives in [place] with her husband about 3 acres of land.  She is retired from working and a Public house manager.  Denies any recent illnesses.  No fevers or chills.  Denies any chest pain or shortness of breath.  Does not exercise regularly.  Reports eating too much junk food.  Drinks occasionally, but not concerned about her consumption.  She does tend to smoke cigarettes on the days that she drinks, which is usually about 3 or 4 out of the week.  No recent weight loss, no hemoptysis.  Denies any history of COPD, does not use inhalers or supplemental oxygen.   Outpatient Encounter Medications as of 11/28/2023  Medication Sig   metoprolol succinate (TOPROL-XL) 25 MG 24 hr tablet Take 1 tablet (25 mg total) by mouth daily.   [DISCONTINUED] Ascorbic Acid (VITAMIN C) 500 MG CAPS Take by mouth.   [DISCONTINUED] atorvastatin (LIPITOR) 40 MG tablet Take 1 tablet 3 x /week for Cholesterol                                           /                              TAKE                             BY                   MOUTH   [DISCONTINUED] B Complex Vitamins (B COMPLEX PO) Take 1 tablet by mouth every other day.    [DISCONTINUED] Cholecalciferol (VITAMIN D3) 5000 UNITS TABS Take 5,000 Units by mouth 2 (two) times daily.   [DISCONTINUED] Magnesium 250 MG TABS Take 250 mg by mouth daily.    [DISCONTINUED] metoprolol tartrate (LOPRESSOR) 25 MG tablet TAKE 1 TABLET BY MOUTH DAILY   [DISCONTINUED] sertraline (ZOLOFT) 100 MG tablet Take   1 tablet   Daily    for Mood                                                                                                       /  TAKE                                         BY                                                 MOUTH   [START ON 11/29/2023] atorvastatin (LIPITOR) 40 MG tablet Take 1 tablet (40 mg total) by mouth 3 (three) times a week. Take 1 tablet 3 x /week for Cholesterol                                           /                              TAKE                             BY                   MOUTH   sertraline (ZOLOFT) 100 MG tablet Take 1 tablet (100 mg total) by mouth at bedtime. Take   1 tablet   Daily   for Mood                                                                                                       /                                                                   TAKE                                         BY                                                 MOUTH   [DISCONTINUED] clindamycin (CLEOCIN T) 1 % external solution Apply 1 Application topically 2 (two) times daily. Pt directed to use for a period of 2 weeks. (Patient not taking: Reported on 08/16/2023)   [DISCONTINUED] clobetasol cream (TEMOVATE) 0.05 % 1 application Externally Twice a day (Patient not taking: Reported on 08/16/2023)   [DISCONTINUED] MYRBETRIQ 50 MG TB24 tablet Take 50 mg by mouth daily.  No facility-administered encounter medications on file as of 11/28/2023.    Past Medical History:  Diagnosis Date   Acquired short Achilles tendon, left    Anemia    Anxiety    Arthritis    hands and spine   Blood transfusion without reported diagnosis    Cervical dysplasia 1976   Complication of anesthesia    COPD (chronic obstructive pulmonary disease) (HCC)    Depression    Diverticulitis of colon (without mention of hemorrhage)(562.11)    Diverticulosis of colon 05/06/2008   Diverticulosis of colon (without mention of hemorrhage)     Elevated cholesterol    GERD (gastroesophageal reflux disease)    no meds now   Heart murmur    Hypertension    pt states she takes med for tachycardia NOT HTN -metoprolol taken for tachycardia   Internal hemorrhoids without mention of complication    Mitral valve disorders(424.0)    MRSA infection    Osteoporosis    spine   Palpitations    Pelvic adhesive disease    Plaque psoriasis    PONV (postoperative nausea and vomiting)    Prediabetes    Psoriatic arthritis (HCC) 05/25/2016   Tachycardia    never had HTN   Vitamin D deficiency     Past Surgical History:  Procedure Laterality Date   ABDOMINAL HYSTERECTOMY  2000   TAH,BSO endometriosis, leiomyomata   CALCANEAL OSTEOTOMY Left 06/25/2020   Procedure: Medializing calcaneal osteotomy;  Surgeon: Toni Arthurs, MD;  Location: [facility];  Service: Orthopedics;  Laterality: Left;   COLONOSCOPY     ECTOPIC PREGNANCY SURGERY  1971, 1979   ESOPHAGOGASTRODUODENOSCOPY     FINGER SURGERY     FOOT ARTHRODESIS Left 06/25/2020   Procedure: Talonavicular Arthrodesis;  Surgeon: Toni Arthurs, MD;  Location: [facility];  Service: Orthopedics;  Laterality: Left;   GANGLION CYST EXCISION Right    hand    GASTROCNEMIUS RECESSION Left 06/25/2020   Procedure: Left gastroc recession;  Surgeon: Toni Arthurs, MD;  Location: [facility];  Service: Orthopedics;  Laterality: Left;   GYNECOLOGIC CRYOSURGERY  1976   HEMORRHOID SURGERY     OOPHORECTOMY     ROTATOR CUFF REPAIR Left    ROTATOR CUFF REPAIR Right 2/15   TENOLYSIS Left 06/25/2020   Procedure: Left Posterior Tibial Tenolysis;  Surgeon: Toni Arthurs, MD;  Location: [facility];  Service: Orthopedics;  Laterality: Left;   TYMPANOPLASTY Right    UPPER GASTROINTESTINAL ENDOSCOPY      Family History  Problem Relation Age of Onset   Diabetes Mother    Hypertension Mother    Melanoma Mother    Diabetes Father    Heart disease  Father    Hyperlipidemia Father    Hypertension Father    Cirrhosis Father        alcoholic   Uterine cancer Sister        UTERINE   Colon cancer Maternal Grandmother        dx in her 86's ?mets from lymphoma   Stomach cancer Maternal Grandmother    Lymphoma Maternal Grandmother    Heart disease Maternal Grandfather    Heart disease Paternal Grandmother    Heart disease Paternal Grandfather    Colon polyps Sister    Esophageal cancer Neg Hx    Rectal cancer Neg Hx    Kidney disease Neg Hx    Pancreatic cancer Neg Hx     Social History  Socioeconomic History   Marital status: Married    Spouse name: Not on file   Number of children: 0   Years of education: Not on file   Highest education level: 12th grade  Occupational History   Occupation: retired  Tobacco Use   Smoking status: Some Days    Current packs/day: 0.00    Average packs/day: 0.6 packs/day for 54.8 years (32.9 ttl pk-yrs)    Types: Cigarettes    Start date: 59    Last attempt to quit: 06/10/2020    Years since quitting: 3.4   Smokeless tobacco: Never   Tobacco comments:    Smokes when she drinks wine  Vaping Use   Vaping status: Never Used  Substance and Sexual Activity   Alcohol use: Yes    Comment: social   Drug use: No   Sexual activity: Yes    Birth control/protection: Surgical  Other Topics Concern   Not on file  Social History Narrative   She lives with husband.  They do not have children.   She previously worked at [employer].   Highest level of education:  High school       Right Handed    Lives in a two story home   Social Drivers of Health   Financial Resource Strain: Low Risk  (11/27/2023)   Overall Financial Resource Strain (CARDIA)    Difficulty of Paying Living Expenses: Not hard at all  Food Insecurity: No Food Insecurity (11/27/2023)   Hunger Vital Sign    Worried About Running Out of Food in the Last Year: Never true    Ran Out of Food in the Last Year: Never true   Transportation Needs: Unknown (11/27/2023)   PRAPARE - Administrator, Civil Service (Medical): Not on file    Lack of Transportation (Non-Medical): No  Physical Activity: Unknown (11/27/2023)   Exercise Vital Sign    Days of Exercise per Week: 7 days    Minutes of Exercise per Session: Patient declined  Stress: Stress Concern Present (11/27/2023)   [employer] of Occupational Health - Occupational Stress Questionnaire    Feeling of Stress : Very much  Social Connections: Moderately Isolated (11/27/2023)   Social Connection and Isolation Panel [NHANES]    Frequency of Communication with Friends and Family: More than three times a week    Frequency of Social Gatherings with Friends and Family: Patient declined    Attends Religious Services: Never    Database administrator or Organizations: No    Attends Engineer, structural: Not on file    Marital Status: Married  Intimate Partner Violence: Unknown (12/01/2021)   Received from [employer], [facility]   HITS    Physically Hurt: Not on file    Insult or Talk Down To: Not on file    Threaten Physical Harm: Not on file    Scream or Curse: Not on file    ROS      Objective    BP 114/72   Pulse 66   Temp 98.5 F (36.9 C) (Temporal)   Wt 146 lb (66.2 kg)   SpO2 96%   BMI 25.86 kg/m   Physical Exam  Gen: Well-appearing woman, mildly frail Eyes: Normal bilaterally Ears: Normal bilateral tympanic membranes Heart: Regular, no murmur Lungs: Unlabored, clear to auscultation throughout Ext: Warm and well-perfused with no edema, normal joints Neuro: Alert, conversational Psych: Appropriate mood and affect, not depressed or anxious.      Assessment &  Plan:   Problem List Items Addressed This Visit       High   Mixed hyperlipidemia - Primary (Chronic)   Chronic and stable issue.  No ischemic vascular events.  She does have one-vessel coronary artery calcifications on CT imaging as well as some  atherosclerosis of the aorta.  Last lipids in December showed an LDL of 95.  I think goal for her is reasonable with an LDL less than 100.  She can only tolerate atorvastatin 40 mg 3 times weekly which is reasonable.  I refilled this medication.  Will plan to check lipids annually.      Relevant Medications   atorvastatin (LIPITOR) 40 MG tablet (Start on 11/29/2023)   metoprolol succinate (TOPROL-XL) 25 MG 24 hr tablet   Tobacco use (Chronic)   Chronic and stable issue.  Ongoing tobacco use about 5 days weekly.  Cigarette use seems to be pretty minimal at this point.  She has some signs of pulmonary emphysema on imaging but clinically does not bother her.  She has been going through a lung cancer screening protocol with annual CT chest.  Last was done in December and was low risk.  Will plan to do another CT scan annually.        Medium    Depression, major, recurrent, in partial remission (HCC) (Chronic)   Chronic and stable.  Mood is doing okay, will plan to continue sertraline 100 mg daily.  She has some significant caregiver stress, taking care of her husband who has some memory impairments.      Relevant Medications   sertraline (ZOLOFT) 100 MG tablet   Palpitations (Chronic)   Chronic and stable.  She has a history of a sensation of palpitations, prior EKG showed premature atrial contractions.  She has had consultation with Dr. Eden Emms.  We are continuing medical management with metoprolol 25 mg daily.  Echo done in 2024 showed mild enlargement of left atrium with mild mitral regurgitation, normal LV.  Symptomatically doing very well on beta-blocker.      Other Visit Diagnoses       Atherosclerosis of aorta (HCC)       Relevant Medications   atorvastatin (LIPITOR) 40 MG tablet (Start on 11/29/2023)   metoprolol succinate (TOPROL-XL) 25 MG 24 hr tablet     Hyperlipidemia, mixed       Relevant Medications   atorvastatin (LIPITOR) 40 MG tablet (Start on 11/29/2023)   metoprolol succinate  (TOPROL-XL) 25 MG 24 hr tablet       Return in about 6 months (around 05/29/2024).   Tyson Alias, MD

## 2023-11-28 NOTE — Assessment & Plan Note (Signed)
 Chronic and stable.  She has a history of a sensation of palpitations, prior EKG showed premature atrial contractions.  She has had consultation with Dr. Eden Emms.  We are continuing medical management with metoprolol 25 mg daily.  Echo done in 2024 showed mild enlargement of left atrium with mild mitral regurgitation, normal LV.  Symptomatically doing very well on beta-blocker.

## 2023-12-07 ENCOUNTER — Ambulatory Visit: Payer: PPO | Admitting: Physician Assistant

## 2023-12-24 NOTE — Progress Notes (Signed)
 " Cardiology Office Note:   Date:  12/25/2023  ID:  [name] [name], DOB 1946/07/29, MRN [id number] PCP: Jerrell Cleatus Ned, MD  [facility] Providers Cardiologist:  Maude Emmer, MD    History of Present Illness:   Discussed the use of AI scribe software for clinical note transcription with the patient, who gave verbal consent to proceed.   [name] [name] is a 78 y.o. female with hx PACs/palpitations, hypertension, hyperlipidemia, smoking hx. History of Present Illness She presents for cardiovascular follow-up.  She experiences palpitations a few times a month, often triggered by large meals. These episodes are managed with metoprolol , and she reports no recent chest pain, shortness of breath, or vision disturbances.  Her current medications include atorvastatin , metoprolol , and Zoloft . Atorvastatin  is currently taken three times a week due to concerns about side effects, but it is well-tolerated. Her LDL cholesterol has improved from 105 to 95, and her total cholesterol has decreased slightly.  She has a history of smoking, starting at age 58, and continues to smoke occasionally. She has mild emphysema and undergoes annual chest imaging to monitor for nodules. She quit smoking for five years in the past and sometimes goes weeks without smoking.  She reports no issues with physical activity, feeling strong and healthy. She regularly lifts 50-pound bags of corn and frequently uses stairs. No swelling in the legs, shortness of breath at night, or need to prop herself up is noted.  Her husband's dementia impacts her social activities. She prepares well-balanced meals but admits to eating hamburgers and fries about once a week.   Studies Reviewed:    EKG:   EKG Interpretation Date/Time:  Monday December 25 2023 10:48:22 EDT Ventricular Rate:  62 PR Interval:  120 QRS Duration:  78 QT Interval:  416 QTC Calculation: 422 R Axis:   32  Text Interpretation: Sinus rhythm  with occasional Premature ventricular complexes When compared with ECG of 04-Feb-2023 12:50, Premature ventricular complexes are now Present Confirmed by Trudy Birmingham [phone]) on 12/25/2023 10:57:05 AM    08/28/23 CT chest lung cancer screening  FINDINGS: Cardiovascular: Heart size is normal. There is no significant pericardial fluid, thickening or pericardial calcification. There is aortic atherosclerosis, as well as atherosclerosis of the great vessels of the mediastinum and the coronary arteries, including calcified atherosclerotic plaque in the left anterior descending and right coronary arteries. Calcifications of the mitral annulus.   Mediastinum/Nodes: No pathologically enlarged mediastinal or hilar lymph nodes. Please note that accurate exclusion of hilar adenopathy is limited on noncontrast CT scans. Esophagus is unremarkable in appearance. No axillary lymphadenopathy.   Lungs/Pleura: No suspicious appearing pulmonary nodules or masses are noted. No acute consolidative airspace disease. No pleural effusions. Mild diffuse bronchial wall thickening with very mild centrilobular and paraseptal emphysema.   Upper Abdomen: Aortic atherosclerosis.  Colonic diverticulosis.   Musculoskeletal: There are no aggressive appearing lytic or blastic lesions noted in the visualized portions of the skeleton.   IMPRESSION: 1. Lung-RADS 1S, negative. Continue annual screening with low-dose chest CT without contrast in 12 months. 2. The S modifier above refers to potentially clinically significant non lung cancer related findings. Specifically, there is aortic atherosclerosis, in addition to two-vessel coronary artery disease. Please note that although the presence of coronary artery calcium  documents the presence of coronary artery disease, the severity of this disease and any potential stenosis cannot be assessed on this non-gated CT examination. Assessment for potential risk factor  modification, dietary therapy or pharmacologic therapy may  be warranted, if clinically indicated. 3. Mild diffuse bronchial wall thickening with very mild centrilobular and paraseptal emphysema; imaging findings suggestive of underlying COPD. 4. Colonic diverticulosis.  Risk Assessment/Calculations:              Physical Exam:   VS:  BP 128/72 (BP Location: Left Arm, Patient Position: Sitting, Cuff Size: Normal)   Pulse 62   Ht 5' 4 (1.626 m)   Wt 148 lb 3.2 oz (67.2 kg)   SpO2 95%   BMI 25.44 kg/m    Wt Readings from Last 3 Encounters:  12/25/23 148 lb 3.2 oz (67.2 kg)  11/28/23 146 lb (66.2 kg)  08/16/23 145 lb 9.6 oz (66 kg)     Physical Exam Vitals reviewed.  Constitutional:      Appearance: Normal appearance.  HENT:     Head: Normocephalic.     Nose: Nose normal.  Eyes:     Pupils: Pupils are equal, round, and reactive to light.  Cardiovascular:     Rate and Rhythm: Normal rate and regular rhythm.     Pulses: Normal pulses.     Heart sounds: Normal heart sounds. No murmur heard.    No friction rub. No gallop.  Pulmonary:     Effort: Pulmonary effort is normal.     Breath sounds: Normal breath sounds.  Abdominal:     General: Abdomen is flat.  Musculoskeletal:     Right lower leg: No edema.     Left lower leg: No edema.  Skin:    General: Skin is warm and dry.     Capillary Refill: Capillary refill takes less than 2 seconds.  Neurological:     General: No focal deficit present.     Mental Status: She is alert and oriented to person, place, and time.  Psychiatric:        Mood and Affect: Mood normal.        Behavior: Behavior normal.        Thought Content: Thought content normal.        Judgment: Judgment normal.       ASSESSMENT AND PLAN:    Assessment & Plan Coronary Atherosclerosis Atherosclerotic plaque present on recent lung cancer CT scan, consistent with smoking history. Asymptomatic with no chest pain or exertional limitations.  Increased risk of myocardial infarction due to plaque. Benefits of aspirin  and increased atorvastatin  dose outweigh risks, aiming to reduce myocardial infarction risk. - Start daily aspirin  81 mg. - Increase atorvastatin  from 40 mg three times a week to daily. - Recheck cholesterol levels in 3 months.  Hyperlipidemia LDL improved from 105 to 95. Total cholesterol slightly decreased. Current atorvastatin  regimen is 40 mg three times a week. Further reduction in cholesterol levels desired to reduce risk of myocardial infarction and stroke. Increasing atorvastatin  to daily dosing is expected to further lower cholesterol. - Increase atorvastatin  40mg  to daily dosing. - Recheck cholesterol levels in 3 months.  Palpitations with PVCs Palpitations occur a few times a month, often postprandially. EKG today shows one PVC. Symptoms are well-controlled with metoprolol . Palpitations are benign and infrequent. - Continue metoprolol  as currently prescribed.  Tobacco use Intermittent smoking with periods of abstinence. Smoking history contributes to atherosclerosis and emphysema. Encouraged to quit smoking entirely. Offered support and resources for smoking cessation if needed. - Encourage smoking cessation. - Offer support and resources for smoking cessation if needed.     Signed, Artist Pouch, PA-C   "

## 2023-12-25 ENCOUNTER — Ambulatory Visit: Attending: Cardiology | Admitting: Cardiology

## 2023-12-25 ENCOUNTER — Encounter: Payer: Self-pay | Admitting: Cardiology

## 2023-12-25 VITALS — BP 128/72 | HR 62 | Ht 64.0 in | Wt 148.2 lb

## 2023-12-25 DIAGNOSIS — E785 Hyperlipidemia, unspecified: Secondary | ICD-10-CM

## 2023-12-25 DIAGNOSIS — E782 Mixed hyperlipidemia: Secondary | ICD-10-CM

## 2023-12-25 DIAGNOSIS — R002 Palpitations: Secondary | ICD-10-CM

## 2023-12-25 DIAGNOSIS — Z79899 Other long term (current) drug therapy: Secondary | ICD-10-CM

## 2023-12-25 DIAGNOSIS — I10 Essential (primary) hypertension: Secondary | ICD-10-CM

## 2023-12-25 DIAGNOSIS — Z720 Tobacco use: Secondary | ICD-10-CM

## 2023-12-25 MED ORDER — ATORVASTATIN CALCIUM 40 MG PO TABS: 40.0000 mg | ORAL_TABLET | Freq: Every day | ORAL | 3 refills | Status: AC

## 2023-12-25 MED ORDER — ASPIRIN 81 MG PO TBEC
81.0000 mg | DELAYED_RELEASE_TABLET | Freq: Every day | ORAL | 3 refills | Status: DC
Start: 2023-12-25 — End: 2024-05-30

## 2023-12-25 NOTE — Patient Instructions (Signed)
 Medication Instructions:   INCREASE YOUR ATORVASTATIN  (LIPITOR) TO 40 MG BY MOUTH DAILY  START TAKING ASPIRIN  81 MG BY MOUTH DAILY  *If you need a refill on your cardiac medications before your next appointment, please call your pharmacy*   Lab Work:  IN 8-12 WEEKS HERE IN THIS BUILDING AT [facility] FIRST FLOOR--LIPIDS AND LFTs--PLEASE COME FASTING TO THIS LAB APPOINTMENT (THIS WOULD BE AROUND 02/19/24 AND AFTER)  If you have labs (blood work) drawn today and your tests are completely normal, you will receive your results only by: MyChart Message (if you have MyChart) OR A paper copy in the mail If you have any lab test that is abnormal or we need to change your treatment, we will call you to review the results.    Follow-Up: At [facility], you and your health needs are our priority.  As part of our continuing mission to provide you with exceptional heart care, our providers are all part of one team.  This team includes your primary Cardiologist (physician) and Advanced Practice Providers or APPs (Physician Assistants and Nurse Practitioners) who all work together to provide you with the care you need, when you need it.  Your next appointment:   1 year(s)  Provider:   Janelle Mediate, MD

## 2024-02-19 ENCOUNTER — Ambulatory Visit: Payer: PPO | Admitting: Nurse Practitioner

## 2024-02-23 ENCOUNTER — Ambulatory Visit: Payer: Self-pay | Admitting: *Deleted

## 2024-02-23 DIAGNOSIS — E785 Hyperlipidemia, unspecified: Secondary | ICD-10-CM

## 2024-02-23 DIAGNOSIS — E782 Mixed hyperlipidemia: Secondary | ICD-10-CM

## 2024-02-23 DIAGNOSIS — Z79899 Other long term (current) drug therapy: Secondary | ICD-10-CM

## 2024-02-23 LAB — HEPATIC FUNCTION PANEL
ALT: 12 IU/L (ref 0–32)
AST: 15 IU/L (ref 0–40)
Albumin: 4.5 g/dL (ref 3.8–4.8)
Alkaline Phosphatase: 81 IU/L (ref 44–121)
Bilirubin Total: 0.4 mg/dL (ref 0.0–1.2)
Bilirubin, Direct: 0.15 mg/dL (ref 0.00–0.40)
Total Protein: 6.9 g/dL (ref 6.0–8.5)

## 2024-02-23 LAB — LIPID PANEL
Chol/HDL Ratio: 2.2 ratio (ref 0.0–4.4)
Cholesterol, Total: 158 mg/dL (ref 100–199)
HDL: 73 mg/dL (ref >39)
LDL Chol Calc (NIH): 72 mg/dL (ref 0–99)
Triglycerides: 64 mg/dL (ref 0–149)
VLDL Cholesterol Cal: 13 mg/dL (ref 5–40)

## 2024-02-23 NOTE — Telephone Encounter (Signed)
-----   Message from Dayna N Dunn sent at 02/23/2024 10:32 AM EDT ----- Covering for Artist Pouch today. Please let pt know that LDL has improved but is not totally at goal of <70. It is very close, thankfully. A few options here. If tolerating atorvastatin  40mg  daily,  increase to 80mg  daily with follow-up fasting lipid panel and LFTs in 2-3 months. If she prefers to try to work on diet/exercise further, would increase activity as tolerated, decrease saturated fats  and sugar, increase fiber/plant based intake, and recheck same labs in 3 months. (Regardless if she picks the med increase would also work on these things.) Thank you. ----- Message ----- From: Interface, [facility] Lab Results In Sent: 02/23/2024   2:35 AM EDT To: Artist Pouch, PA-C

## 2024-02-23 NOTE — Telephone Encounter (Signed)
 The patient has been notified of the result and verbalized understanding.  All questions (if any) were answered.  Pt wants to work on lifestyle modification with increasing her activity and reducing her saturated fats and sugars, and increase her fiber and vegetable intake.  Pt agreed to come back in for lab work in 3 months, to recheck lipids and reassess her numbers.   Repeat lipids in 3 months placed in the system and released.  Pt aware this will be around the last week in Sept, and to write this down on her calendar.  Advised her to come fasting to this lab appt.   Pt verbalized understanding and agrees with this plan.

## 2024-02-29 ENCOUNTER — Ambulatory Visit: Payer: PPO | Admitting: Nurse Practitioner

## 2024-05-30 ENCOUNTER — Encounter: Payer: Self-pay | Admitting: Student in an Organized Health Care Education/Training Program

## 2024-05-30 ENCOUNTER — Ambulatory Visit: Admitting: Student in an Organized Health Care Education/Training Program

## 2024-05-30 VITALS — BP 134/77 | HR 63 | Ht 64.0 in | Wt 139.0 lb

## 2024-05-30 DIAGNOSIS — I10 Essential (primary) hypertension: Secondary | ICD-10-CM | POA: Diagnosis not present

## 2024-05-30 DIAGNOSIS — F3341 Major depressive disorder, recurrent, in partial remission: Secondary | ICD-10-CM | POA: Diagnosis not present

## 2024-05-30 DIAGNOSIS — M2550 Pain in unspecified joint: Secondary | ICD-10-CM | POA: Insufficient documentation

## 2024-05-30 DIAGNOSIS — Z23 Encounter for immunization: Secondary | ICD-10-CM

## 2024-05-30 DIAGNOSIS — E782 Mixed hyperlipidemia: Secondary | ICD-10-CM | POA: Diagnosis not present

## 2024-05-30 DIAGNOSIS — R7303 Prediabetes: Secondary | ICD-10-CM | POA: Insufficient documentation

## 2024-05-30 DIAGNOSIS — M25572 Pain in left ankle and joints of left foot: Secondary | ICD-10-CM | POA: Insufficient documentation

## 2024-05-30 DIAGNOSIS — L237 Allergic contact dermatitis due to plants, except food: Secondary | ICD-10-CM

## 2024-05-30 DIAGNOSIS — L409 Psoriasis, unspecified: Secondary | ICD-10-CM | POA: Insufficient documentation

## 2024-05-30 LAB — COMPREHENSIVE METABOLIC PANEL WITH GFR
ALT: 14 U/L (ref 0–35)
AST: 19 U/L (ref 0–37)
Albumin: 4.5 g/dL (ref 3.5–5.2)
Alkaline Phosphatase: 70 U/L (ref 39–117)
BUN: 13 mg/dL (ref 6–23)
CO2: 28 meq/L (ref 19–32)
Calcium: 9.5 mg/dL (ref 8.4–10.5)
Chloride: 102 meq/L (ref 96–112)
Creatinine, Ser: 0.79 mg/dL (ref 0.40–1.20)
GFR: 72.43 mL/min (ref >60.00)
Glucose, Bld: 99 mg/dL (ref 70–99)
Potassium: 4.7 meq/L (ref 3.5–5.1)
Sodium: 137 meq/L (ref 135–145)
Total Bilirubin: 0.7 mg/dL (ref 0.2–1.2)
Total Protein: 7.4 g/dL (ref 6.0–8.3)

## 2024-05-30 LAB — LIPID PANEL
Cholesterol: 163 mg/dL (ref 0–200)
HDL: 75.6 mg/dL (ref >39.00)
LDL Cholesterol: 73 mg/dL (ref 0–99)
NonHDL: 87.07
Total CHOL/HDL Ratio: 2
Triglycerides: 72 mg/dL (ref 0.0–149.0)
VLDL: 14.4 mg/dL (ref 0.0–40.0)

## 2024-05-30 LAB — HEMOGLOBIN A1C: Hgb A1c MFr Bld: 6 % (ref 4.6–6.5)

## 2024-05-30 MED ORDER — TRIAMCINOLONE ACETONIDE 0.1 % EX CREA: 1.0000 | TOPICAL_CREAM | Freq: Two times a day (BID) | CUTANEOUS | 0 refills | Status: DC

## 2024-05-30 NOTE — Assessment & Plan Note (Signed)
 Chronic and stable.  Primary prevention.  Tolerating atorvastatin  40 mg daily well.  Will check cholesterol today.  Goal LDL less than 100.

## 2024-05-30 NOTE — Assessment & Plan Note (Signed)
 Last A1c was 5.8% about a year ago.  At risk for progression given comorbid hyperlipidemia.  Will recheck A1c today.

## 2024-05-30 NOTE — Patient Instructions (Signed)
  VISIT SUMMARY: Today, you came in for your annual wellness visit and to address a rash under your chin, which you believe is from poison ivy. We also reviewed your ongoing health conditions and medications.  YOUR PLAN: -ALLERGIC CONTACT DERMATITIS DUE TO POISON IVY: This is a skin reaction caused by contact with poison ivy. Since the Benadryl  cream was not effective, I have prescribed triamcinolone ointment to be applied once or twice daily to help reduce the inflammation and itching.  -PLAQUE PSORIASIS: This is a chronic skin condition that causes red, scaly patches. You have discontinued methotrexate due to the inconvenience of the dosage. Continue using the cream prescribed by your dermatologist as needed.  -MIXED HYPERLIPIDEMIA: This is a condition where you have high levels of different types of fats in your blood. You are managing this with atorvastatin  40 mg three times a week. We will perform blood work to assess your cholesterol levels.  -DEPRESSION: You have major recurrent depression, which is currently in partial remission. You are finding sertraline  100 mg daily effective. Continue taking sertraline  100 mg daily.  -PALPITATIONS: These are feelings of having a fast-beating, fluttering, or pounding heart. Your symptoms are controlled with metoprolol  succinate 25 mg daily. Continue taking metoprolol  succinate 25 mg daily.  -TOBACCO USE: You smoke occasionally but not daily. It's important to consider quitting smoking entirely for your overall health.  -GENERAL HEALTH MAINTENANCE: Your blood pressure is well-controlled, and you are engaging in regular physical activity. We discussed and administered the flu vaccine today. You are up to date with your mammogram, and we will schedule your next one for next year. Routine blood work will be performed to check your cholesterol and other parameters.  INSTRUCTIONS: Please follow up in six months for your next wellness visit. Make sure to get  your blood work done as discussed. Continue taking your medications as prescribed and apply the triamcinolone ointment for the poison ivy rash. If you have any new symptoms or concerns, please contact the office.

## 2024-05-30 NOTE — Assessment & Plan Note (Signed)
 Chronic and stable.  Doing well on sertraline  100 mg daily.  Symptoms are well-controlled, will continue.  No side effects.

## 2024-05-30 NOTE — Assessment & Plan Note (Signed)
 Dermatitis under chin from poison ivy exposure. Benadryl  cream was ineffective. Prescribed triamcinolone cream once or twice daily.

## 2024-05-30 NOTE — Progress Notes (Signed)
 Established Patient Office Visit  Subjective   Patient ID: [name] [name], female    DOB: 16-May-1946  Age: 78 y.o. MRN: [id number]  Chief Complaint  Patient presents with   Hyperlipidemia    6 month follow up.    Rash    On face. Thinks its poison ivy. Sx started friday    HPI  Discussed the use of AI scribe software for clinical note transcription with the patient, who gave verbal consent to proceed.  History of Present Illness [name] [name] is a 78 year old female who presents with a poison ivy rash.  She developed a rash under her chin after cleaning hedges and bushes, which she attributes to poison ivy exposure. She has been using Benadryl  cream, which relieves the itching but does not resolve the rash.  She has a history of plaque psoriasis and uses a cream prescribed by her dermatologist. She previously took methotrexate but discontinued it due to the burden of taking ten pills a week. She does not recall the name of the current cream but uses it infrequently.  She is currently taking atorvastatin  for cholesterol management with no side effects. She also takes sertraline , which she finds helpful for her mood, although she experiences stress related to life events. She occasionally smokes but not daily.  No issues with hearing or vision. She engages in physical activity by frequently going up and down stairs. She has a history of palpitations for which she takes metoprolol .      Objective:     BP 134/77 (BP Location: Left Arm, Patient Position: Sitting, Cuff Size: Normal)   Pulse 63   Ht 5' 4 (1.626 m)   Wt 139 lb (63 kg)   BMI 23.86 kg/m   Physical Exam  Gen: Well-appearing woman Skin: Area of erythema with papules and erosions underneath her chin consistent with contact dermatitis Heart: Regular, no murmur Lungs: Unlabored, clear throughout Abd: Soft, nontender, no organomegaly Ext: Warm, no edema, normal joints    Assessment & Plan:      Problem List Items Addressed This Visit       High   Mixed hyperlipidemia (Chronic)   Chronic and stable.  Primary prevention.  Tolerating atorvastatin  40 mg daily well.  Will check cholesterol today.  Goal LDL less than 100.      Relevant Orders   Comprehensive metabolic panel with GFR   Lipid panel     Medium    Hypertension (Chronic)   Blood pressure little elevated today at 134/77.  It was normal at our last visit 6 months ago at 115 systolic.  Not currently using antihypertensive medication.  Could be progression into stage I hypertension.  I recommended we monitor this and continue with lifestyle modifications.  I recommended increasing exercise and healthy diet.  Follow-up in 6 months for recheck of blood pressure.  May need to consider starting losartan in the future.  Check renal function today.      Depression, major, recurrent, in partial remission (Chronic)   Chronic and stable.  Doing well on sertraline  100 mg daily.  Symptoms are well-controlled, will continue.  No side effects.        Low   Contact dermatitis due to rhus toxicodendron   Dermatitis under chin from poison ivy exposure. Benadryl  cream was ineffective. Prescribed triamcinolone cream once or twice daily.      Relevant Medications   triamcinolone cream (KENALOG) 0.1 %   Prediabetes   Last A1c  was 5.8% about a year ago.  At risk for progression given comorbid hyperlipidemia.  Will recheck A1c today.      Relevant Orders   Hemoglobin A1c   Other Visit Diagnoses       Need for influenza vaccination    -  Primary   Relevant Orders   Flu vaccine HIGH DOSE PF(Fluzone Trivalent)       Return in about 6 months (around 11/28/2024).    Cleatus Debby Specking, MD

## 2024-05-30 NOTE — Assessment & Plan Note (Signed)
 Blood pressure little elevated today at 134/77.  It was normal at our last visit 6 months ago at 115 systolic.  Not currently using antihypertensive medication.  Could be progression into stage I hypertension.  I recommended we monitor this and continue with lifestyle modifications.  I recommended increasing exercise and healthy diet.  Follow-up in 6 months for recheck of blood pressure.  May need to consider starting losartan in the future.  Check renal function today.

## 2024-05-31 ENCOUNTER — Ambulatory Visit: Payer: Self-pay | Admitting: Student in an Organized Health Care Education/Training Program

## 2024-06-12 ENCOUNTER — Other Ambulatory Visit: Payer: Self-pay | Admitting: Urology

## 2024-06-12 ENCOUNTER — Ambulatory Visit: Payer: Self-pay

## 2024-06-12 NOTE — Telephone Encounter (Signed)
 FYI Only or Action Required?: FYI only for provider.  Patient was last seen in primary care on 05/30/2024 by Jerrell Cleatus Ned, MD.  Called Nurse Triage reporting Urinary Frequency.  Symptoms began a week ago.  Interventions attempted: Nothing.  Symptoms are: gradually worsening.  Triage Disposition: See Physician Within 24 Hours  Patient/caregiver understands and will follow disposition?: Yes        Copied from CRM [id number]. Topic: Clinical - Red Word Triage >> Jun 12, 2024  9:36 AM Dedra NOVAK wrote: [facility] that prompted transfer to Nurse Triage: Pt having burning with urination and frequent urination. Warm transfer to NT Reason for Disposition  Age > 50 years  Answer Assessment - Initial Assessment Questions 1. SEVERITY: How bad is the pain?  (e.g., Scale 1-10; mild, moderate, or severe)     4/10 2. FREQUENCY: How many times have you had painful urination today?      Once but just woke up  3. PATTERN: Is pain present every time you urinate or just sometimes?     Every time  4. ONSET: When did the painful urination start?      10/9 or 10/10  5. FEVER: Do you have a fever? If Yes, ask: What is your temperature, how was it measured, and when did it start?     Denies  6. OTHER SYMPTOMS: Do you have any other symptoms? (e.g., blood in urine, flank pain, genital sores, urgency, vaginal discharge)     Denies  Protocols used: Urination Pain - Female-A-AH

## 2024-06-12 NOTE — Telephone Encounter (Signed)
 Patient has appt tomorrow to address concerns.

## 2024-06-13 ENCOUNTER — Ambulatory Visit (INDEPENDENT_AMBULATORY_CARE_PROVIDER_SITE_OTHER): Admitting: Student in an Organized Health Care Education/Training Program

## 2024-06-13 VITALS — BP 135/73 | HR 60 | Wt 141.0 lb

## 2024-06-13 DIAGNOSIS — R350 Frequency of micturition: Secondary | ICD-10-CM

## 2024-06-13 DIAGNOSIS — N3000 Acute cystitis without hematuria: Secondary | ICD-10-CM | POA: Diagnosis not present

## 2024-06-13 DIAGNOSIS — L237 Allergic contact dermatitis due to plants, except food: Secondary | ICD-10-CM

## 2024-06-13 LAB — POCT URINALYSIS DIPSTICK
Bilirubin, UA: NEGATIVE
Glucose, UA: NEGATIVE
Ketones, UA: NEGATIVE
Leukocytes, UA: NEGATIVE
Nitrite, UA: NEGATIVE
Protein, UA: NEGATIVE
Spec Grav, UA: 1.01 (ref 1.010–1.025)
Urobilinogen, UA: 0.2 U/dL
pH, UA: 5.5 (ref 5.0–8.0)

## 2024-06-13 MED ORDER — NITROFURANTOIN MONOHYD MACRO 100 MG PO CAPS: 100.0000 mg | ORAL_CAPSULE | Freq: Two times a day (BID) | ORAL | 0 refills | Status: AC

## 2024-06-13 NOTE — Progress Notes (Signed)
 Acute Office Visit  Subjective:     Patient ID: [name] [name], female    DOB: [birth year], 78 y.o.   MRN: [id number]  Chief Complaint  Patient presents with   Urinary Frequency    10/09 burning when urinating and frequency.     HPI  Discussed the use of AI scribe software for clinical note transcription with the patient, who gave verbal consent to proceed.  History of Present Illness [name] [name] is a 78 year old female who presents with symptoms suggestive of a urinary tract infection.  She has been experiencing dysuria since last Thursday, which has progressively worsened. She now experiences nocturia, urinating three to four times a night, which is unusual for her as she typically urinates once per night. The sensation is described as uncomfortable rather than burning. No hematuria is noted.  She has not had a urinary tract infection in years, with her last urinalysis in December being normal. No fever, nausea, abdominal pain, or vaginal discharge is present. Her symptoms have remained consistent over the past week.  She is currently taking prednisone  for a recent episode of poison ivy, which was located on her face and neck. She reports that her poison ivy is gone and she is down to two tablets of prednisone . She started the medication on October 5th. She has not taken any over-the-counter products for her urinary symptoms.  She is scheduled for a urethral bulking procedure on October 28th due to stress incontinence, as Myrbetriq was ineffective for her condition. She has no known allergies to antibiotics and has not had issues with Macrobid  in the past.  No fever, nausea, abdominal pain, or vaginal discharge. She reports increased frequency and urgency of urination, especially at night, but feels she empties her bladder fully with a normal stream. She is trying to maintain adequate hydration and describes her urine as clear.      Objective:    BP 135/73    Pulse 60   Wt 141 lb (64 kg)   BMI 24.20 kg/m   Physical Exam  Gen: Well appearing woman Abd; soft, nontender, nondistended, only mild discomfort with palpation over the bladder Back: No flank tenderness Ext: Warm, no edema      Assessment & Plan:    Problem List Items Addressed This Visit       Low   Contact dermatitis due to rhus toxicodendron   Previous dermatitis has resolved, and the prednisone  taper is complete. Discontinue prednisone  as symptoms have resolved.         Unprioritized   Acute cystitis - Primary   Symptoms suggest a UTI, likely exacerbated by recent prednisone  use. Obtain a urine sample for urinalysis and culture. Prescribe Macrobid  100 mg orally twice daily for 7 days. Advise stopping the antibiotic early if symptoms resolve before completing the course. Discuss common side effects of Macrobid , including upset stomach and diarrhea, and the low risk of C. diff infection.      Relevant Medications   nitrofurantoin , macrocrystal-monohydrate, (MACROBID ) 100 MG capsule   Other Relevant Orders   Urinalysis, Routine w reflex microscopic   Urine Culture    Meds ordered this encounter  Medications   nitrofurantoin , macrocrystal-monohydrate, (MACROBID ) 100 MG capsule    Sig: Take 1 capsule (100 mg total) by mouth 2 (two) times daily for 7 days.    Dispense:  14 capsule    Refill:  0    Return if symptoms worsen or fail to improve.  Cleatus Debby Specking, MD

## 2024-06-13 NOTE — Assessment & Plan Note (Signed)
 Previous dermatitis has resolved, and the prednisone  taper is complete. Discontinue prednisone  as symptoms have resolved.

## 2024-06-13 NOTE — Addendum Note (Signed)
 Addended by: RANDINE HOVE R on: 06/13/2024 01:15 PM   Modules accepted: Orders

## 2024-06-13 NOTE — Assessment & Plan Note (Signed)
 Symptoms suggest a UTI, likely exacerbated by recent prednisone  use. Obtain a urine sample for urinalysis and culture. Prescribe Macrobid  100 mg orally twice daily for 7 days. Advise stopping the antibiotic early if symptoms resolve before completing the course. Discuss common side effects of Macrobid , including upset stomach and diarrhea, and the low risk of C. diff infection.

## 2024-06-13 NOTE — Patient Instructions (Signed)
 SURGICAL WAITING ROOM VISITATION Patients having surgery or a procedure may have no more than 2 support people in the waiting area - these visitors may rotate in the visitor waiting room.   If the patient needs to stay at the hospital during part of their recovery, the visitor guidelines for inpatient rooms apply.  PRE-OP VISITATION  Pre-op nurse will coordinate an appropriate time for 1 support person to accompany the patient in pre-op.  This support person may not rotate.  This visitor will be contacted when the time is appropriate for the visitor to come back in the pre-op area.  Please refer to the [facility] website for the visitor guidelines for Inpatients (after your surgery is over and you are in a regular room).  You are not required to quarantine at this time prior to your surgery. However, you must do this: Hand Hygiene often Do NOT share personal items Notify your provider if you are in close contact with someone who has COVID or you develop fever 100.4 or greater, new onset of sneezing, cough, sore throat, shortness of breath or body aches.  If you test positive for Covid or have been in contact with anyone that has tested positive in the last 10 days please notify you surgeon.    Your procedure is scheduled on:  TUESDAY  06-25-24  Report to [facility] Main Entrance: [address] entrance where the [employer] is available.   Report to admitting at: 09:00    AM  Call this number if you have any questions or problems the morning of surgery [phone]  DO NOT EAT OR DRINK ANYTHING AFTER MIDNIGHT THE NIGHT PRIOR TO YOUR SURGERY / PROCEDURE.   FOLLOW  ANY ADDITIONAL PRE OP INSTRUCTIONS YOU RECEIVED FROM YOUR SURGEON'S OFFICE!!!   Oral Hygiene is also important to reduce your risk of infection.        Remember - BRUSH YOUR TEETH THE MORNING OF SURGERY WITH YOUR REGULAR TOOTHPASTE  Do NOT smoke after Midnight the night before surgery.  STOP TAKING all Vitamins,  Herbs and supplements 1 week before your surgery.   Take ONLY these medicines the morning of surgery with A SIP OF WATER: Metoprolol   You may not have any metal on your body including hair pins, jewelry, and body piercing  Do not wear make-up, lotions, powders, perfumes  or deodorant  Do not wear nail polish including gel and S&S, artificial / acrylic nails, or any other type of covering on natural nails including finger and toenails. If you have artificial nails, gel coating, etc., that needs to be removed by a nail salon, Please have this removed prior to surgery. Not doing so may mean that your surgery could be cancelled or delayed if the Surgeon or anesthesia staff feels like they are unable to monitor you safely.   Do not shave 48 hours prior to surgery to avoid nicks in your skin which may contribute to postoperative infections.   Contacts, Hearing Aids, dentures or bridgework may not be worn into surgery. DENTURES WILL BE REMOVED PRIOR TO SURGERY PLEASE DO NOT APPLY Poly grip OR ADHESIVES!!!  Patients discharged on the day of surgery will not be allowed to drive home.  Someone NEEDS to stay with you for the first 24 hours after anesthesia.  Do not bring your home medications to the hospital. The Pharmacy will dispense medications listed on your medication list to you during your admission in the Hospital.  Please read over the following fact  sheets you were given: IF YOU HAVE QUESTIONS ABOUT YOUR PRE-OP INSTRUCTIONS, PLEASE CALL [phone].   [facility] - Preparing for Surgery      Before surgery, you can play an important role.  Because skin is not sterile, your skin needs to be as free of germs as possible.  You can reduce the number of germs on your skin by washing with CHG (chlorahexidine gluconate) soap before surgery.  CHG is an antiseptic cleaner which kills germs and bonds with the skin to continue killing germs even after washing. Please DO NOT use if you have an allergy  to CHG or antibacterial soaps.  If your skin becomes reddened/irritated stop using the CHG and inform your nurse when you arrive at Short Stay. Do not shave (including legs and underarms) for at least 48 hours prior to the first CHG shower.  You may shave your face/neck.  Please follow these instructions carefully:  1.  Shower with CHG Soap the night before surgery ONLY (DO NOT USE THE CHG SOAP THE MORNING OF SURGERY).  2.  If you choose to wash your hair, wash your hair first as usual with your normal  shampoo.  3.  After you shampoo, rinse your hair and body thoroughly to remove the shampoo.                             4.  Use CHG as you would any other liquid soap.  You can apply chg directly to the skin and wash.  Gently with a scrungie or clean washcloth.  5.  Apply the CHG Soap to your body ONLY FROM THE NECK DOWN.   Do not use on face/ open                           Wound or open sores. Avoid contact with eyes, ears mouth and genitals (private parts).                       Wash face,  Genitals (private parts) with your normal soap.             6.  Wash thoroughly, paying special attention to the area where your  surgery  will be performed.  7.  Thoroughly rinse your body with warm water from the neck down.  8.  DO NOT shower/wash with your normal soap after using and rinsing off the CHG Soap.                9.  Pat yourself dry with a clean towel.            10.  Wear clean pajamas.            11.  Place clean sheets on your bed the night of your first shower and do not  sleep with pets.  Day of Surgery : Do not apply any CHG, lotions/deodorants the morning of surgery.  Please wear clean clothes to the hospital/surgery center.   FAILURE TO FOLLOW THESE INSTRUCTIONS MAY RESULT IN THE CANCELLATION OF YOUR SURGERY  PATIENT SIGNATURE_________________________________  NURSE  SIGNATURE__________________________________  ________________________________________________________________________

## 2024-06-13 NOTE — Progress Notes (Signed)
 COVID Vaccine received:  []  No [x]  Yes Date of any COVID positive Test in last 90 days:  none  PCP - Cleatus Specking, MD  Cardiologist - Maude Emmer, MD   Chest x-ray -  EKG - 12-25-2023  Epic  Stress Test -  ECHO - 12-20-2022  Epic Cardiac Cath -  CT Coronary Calcium  score:   Bowel Prep - [x]  No  []   Yes ______  Pacemaker / ICD device [x]  No []  Yes   Spinal Cord Stimulator:[x]  No []  Yes       History of Sleep Apnea? [x]  No []  Yes   CPAP used?- [x]  No []  Yes    Patient has: []  NO Hx DM   [x]  Pre-DM   []  DM1  []   DM2 Does the patient monitor blood sugar?   []  N/A   [x]  No []  Yes  Last A1c was:  6.0 on  05-30-24     Blood Thinner / Instructions: none Aspirin  Instructions:  none  Dental hx: []  Dentures:  []  N/A      [x]  Bridge or Partial: in top front                  []  Loose or Damaged teeth:   Activity level: Able to walk up 2 flights of stairs without becoming significantly short of breath or having chest pain?  []  No   [x]    Yes  Patient can perform ADLs without assistance. []  No   [x]   Yes  Anesthesia review: Tachycardia, HTN, Pre-DM PONV, COPD, GERD, Heart Murmur (ECHO 11-2022), Mitral valve prolapse, Psoriatic arthritis,  Hx Amaurosis fugax. Some days smoker  Patient denies any S&S of respiratory illness or Covid - no shortness of breath, fever, cough or chest pain at PAT appointment.  Patient verbalized understanding and agreement to the Pre-Surgical Instructions that were given to them at this PAT appointment. Patient was also educated of the need to review these PAT instructions again prior to her surgery.I reviewed the appropriate phone numbers to call if they have any and questions or concerns.

## 2024-06-13 NOTE — Patient Instructions (Signed)
  VISIT SUMMARY: Today, you were seen for symptoms that suggest a urinary tract infection (UTI). You have been experiencing discomfort while urinating and increased frequency, especially at night. You are also currently taking prednisone  for a recent episode of poison ivy, which has now resolved. Additionally, you are scheduled for a urethral bulking procedure later this month due to stress incontinence.  YOUR PLAN: -URINARY TRACT INFECTION: A urinary tract infection (UTI) is an infection in any part of your urinary system. Your symptoms suggest a UTI, likely worsened by your recent use of prednisone . We will obtain a urine sample for testing and prescribe Macrobid  100 mg to be taken orally twice daily for 7 days. You can stop the antibiotic early if your symptoms resolve before completing the course. Be aware of common side effects like upset stomach and diarrhea, and note that there is a low risk of a more serious infection called C. diff.  -STRESS INCONTINENCE: Stress incontinence is the unintentional loss of urine during physical activity. You are scheduled for a urethral bulking procedure on October 28th because Myrbetriq has not been effective for you.  -HISTORY OF CONTACT DERMATITIS DUE TO RHUS TOXICODENDRON: Contact dermatitis is a skin rash caused by contact with a certain substance. Your previous dermatitis from poison ivy has resolved, and you can discontinue prednisone  as your symptoms have resolved.  INSTRUCTIONS: Please follow up with us  if your symptoms do not improve or if you experience any side effects from the medication. Remember to complete the urine sample for testing. Your urethral bulking procedure is scheduled for October 28th. If you have any questions or concerns, do not hesitate to contact our office.

## 2024-06-14 ENCOUNTER — Other Ambulatory Visit: Payer: Self-pay

## 2024-06-14 ENCOUNTER — Encounter (HOSPITAL_COMMUNITY): Payer: Self-pay

## 2024-06-14 ENCOUNTER — Encounter (HOSPITAL_COMMUNITY)
Admission: RE | Admit: 2024-06-14 | Discharge: 2024-06-14 | Disposition: A | Discharge status: Home / Self Care | Source: Ambulatory Visit | Attending: Urology | Admitting: Urology

## 2024-06-14 VITALS — BP 127/68 | HR 57 | Temp 98.8°F | Resp 14 | Ht 64.0 in | Wt 140.0 lb

## 2024-06-14 DIAGNOSIS — R7303 Prediabetes: Secondary | ICD-10-CM | POA: Insufficient documentation

## 2024-06-14 DIAGNOSIS — Z01812 Encounter for preprocedural laboratory examination: Secondary | ICD-10-CM | POA: Diagnosis not present

## 2024-06-14 DIAGNOSIS — Z01818 Encounter for other preprocedural examination: Secondary | ICD-10-CM | POA: Diagnosis present

## 2024-06-14 DIAGNOSIS — I10 Essential (primary) hypertension: Secondary | ICD-10-CM | POA: Insufficient documentation

## 2024-06-14 HISTORY — DX: Cardiac arrhythmia, unspecified: I49.9

## 2024-06-14 HISTORY — DX: Malignant (primary) neoplasm, unspecified: C80.1

## 2024-06-14 HISTORY — DX: Prediabetes: R73.03

## 2024-06-14 LAB — URINALYSIS, ROUTINE W REFLEX MICROSCOPIC
Bilirubin Urine: NEGATIVE
Ketones, ur: NEGATIVE
Leukocytes,Ua: NEGATIVE
Nitrite: NEGATIVE
Specific Gravity, Urine: <=1.005 — AB (ref 1.000–1.030)
Total Protein, Urine: NEGATIVE
Urine Glucose: NEGATIVE
Urobilinogen, UA: 0.2 (ref 0.0–1.0)
pH: 6.5 (ref 5.0–8.0)

## 2024-06-14 LAB — SURGICAL PCR SCREEN
MRSA, PCR: NEGATIVE
Staphylococcus aureus: POSITIVE — AB

## 2024-06-14 LAB — URINE CULTURE
MICRO NUMBER:: 17108976
Result:: NO GROWTH
SPECIMEN QUALITY:: ADEQUATE

## 2024-06-14 LAB — BASIC METABOLIC PANEL WITH GFR
Anion gap: 10 (ref 5–15)
BUN: 19 mg/dL (ref 8–23)
CO2: 26 mmol/L (ref 22–32)
Calcium: 9.3 mg/dL (ref 8.9–10.3)
Chloride: 102 mmol/L (ref 98–111)
Creatinine, Ser: 0.87 mg/dL (ref 0.44–1.00)
GFR, Estimated: >60 mL/min (ref >60)
Glucose, Bld: 88 mg/dL (ref 70–99)
Potassium: 4.5 mmol/L (ref 3.5–5.1)
Sodium: 137 mmol/L (ref 135–145)

## 2024-06-14 LAB — CBC
HCT: 41.4 % (ref 36.0–46.0)
Hemoglobin: 13.1 g/dL (ref 12.0–15.0)
MCH: 30.8 pg (ref 26.0–34.0)
MCHC: 31.6 g/dL (ref 30.0–36.0)
MCV: 97.4 fL (ref 80.0–100.0)
Platelets: 252 K/uL (ref 150–400)
RBC: 4.25 MIL/uL (ref 3.87–5.11)
RDW: 13.6 % (ref 11.5–15.5)
WBC: 12.8 K/uL — ABNORMAL HIGH (ref 4.0–10.5)
nRBC: 0 % (ref 0.0–0.2)

## 2024-06-17 ENCOUNTER — Telehealth: Payer: Self-pay | Admitting: Cardiovascular Disease

## 2024-06-17 ENCOUNTER — Ambulatory Visit: Payer: Self-pay | Admitting: Student in an Organized Health Care Education/Training Program

## 2024-06-17 NOTE — Telephone Encounter (Signed)
  Patient Consent for Virtual Visit        [name] [name] has provided verbal consent on 06/17/2024 for a virtual visit (video or telephone).   CONSENT FOR VIRTUAL VISIT FOR:  [name] [name]  By participating in this virtual visit I agree to the following:  I hereby voluntarily request, consent and authorize [facility] and its employed or contracted physicians, physician assistants, nurse practitioners or other licensed health care professionals (the Practitioner), to provide me with telemedicine health care services (the "Services) as deemed necessary by the treating Practitioner. I acknowledge and consent to receive the Services by the Practitioner via telemedicine. I understand that the telemedicine visit will involve communicating with the Practitioner through live audiovisual communication technology and the disclosure of certain medical information by electronic transmission. I acknowledge that I have been given the opportunity to request an in-person assessment or other available alternative prior to the telemedicine visit and am voluntarily participating in the telemedicine visit.  I understand that I have the right to withhold or withdraw my consent to the use of telemedicine in the course of my care at any time, without affecting my right to future care or treatment, and that the Practitioner or I may terminate the telemedicine visit at any time. I understand that I have the right to inspect all information obtained and/or recorded in the course of the telemedicine visit and may receive copies of available information for a reasonable fee.  I understand that some of the potential risks of receiving the Services via telemedicine include:  Delay or interruption in medical evaluation due to technological equipment failure or disruption; Information transmitted may not be sufficient (e.g. poor resolution of images) to allow for appropriate medical decision making by the  Practitioner; and/or  In rare instances, security protocols could fail, causing a breach of personal health information.  Furthermore, I acknowledge that it is my responsibility to provide information about my medical history, conditions and care that is complete and accurate to the best of my ability. I acknowledge that Practitioner's advice, recommendations, and/or decision may be based on factors not within their control, such as incomplete or inaccurate data provided by me or distortions of diagnostic images or specimens that may result from electronic transmissions. I understand that the practice of medicine is not an exact science and that Practitioner makes no warranties or guarantees regarding treatment outcomes. I acknowledge that a copy of this consent can be made available to me via my patient portal [facility] MyChart), or I can request a printed copy by calling the office of [facility].    I understand that my insurance will be billed for this visit.   I have read or had this consent read to me. I understand the contents of this consent, which adequately explains the benefits and risks of the Services being provided via telemedicine.  I have been provided ample opportunity to ask questions regarding this consent and the Services and have had my questions answered to my satisfaction. I give my informed consent for the services to be provided through the use of telemedicine in my medical care

## 2024-06-17 NOTE — Telephone Encounter (Signed)
   Name: [name] [name]  DOB: Mar 26, 1946  MRN: [id number]  Primary Cardiologist: Maude Emmer, MD  Preoperative team, please contact this patient and set up a phone call appointment for further preoperative risk assessment. Please obtain consent and complete medication review. Thank you for your help.  I confirm that guidance regarding antiplatelet and oral anticoagulation therapy has been completed and, if necessary, noted below. None requested.   I also confirmed the patient resides in the state of [place] . As per [facility] Medical Board telemedicine laws, the patient must reside in the state in which the provider is licensed.   Craven Crean D Lancelot Alyea, NP 06/17/2024, 11:04 AM [facility]

## 2024-06-17 NOTE — Telephone Encounter (Signed)
   Pre-operative Risk Assessment    Patient Name: [name] [name]  DOB: 1946/02/25 MRN: [id number]   Date of last office visit: 12/25/23 Date of next office visit: Not yet scheduled   Request for Surgical Clearance    Procedure:  Cystoscopy with Bulkamid  Date of Surgery:  Clearance 06/25/24                                Surgeon:  Dr. Valli Shank Surgeon's Group or Practice Name:  [facility] Phone number:  [phone]  Fax number:  [phone]   Type of Clearance Requested:   - Medical    Type of Anesthesia:  MAC   Additional requests/questions:  Caller Preston) stated patient will need medical clearance.    Signed, Jasmin B Wilson   06/17/2024, 10:41 AM

## 2024-06-17 NOTE — Telephone Encounter (Signed)
 Pt. To have pre-op clearance on 06/21/24 with Damien Braver, NP.

## 2024-06-21 ENCOUNTER — Ambulatory Visit: Attending: Cardiovascular Disease | Admitting: Nurse Practitioner

## 2024-06-21 DIAGNOSIS — Z01810 Encounter for preprocedural cardiovascular examination: Secondary | ICD-10-CM

## 2024-06-21 NOTE — Progress Notes (Signed)
 Virtual Visit via Telephone Note   Because of [name] [name] co-morbid illnesses, she is at least at moderate risk for complications without adequate follow up.  This format is felt to be most appropriate for this patient at this time.  Due to technical limitations with video connection (technology), today's appointment will be conducted as an audio only telehealth visit, and [name] [name] verbally agreed to proceed in this manner.   All issues noted in this document were discussed and addressed.  No physical exam could be performed with this format.  Evaluation Performed:  Preoperative cardiovascular risk assessment _____________   Date:  06/21/2024   Patient ID:  [name] [name], DOB Mar 04, 1946, MRN [id number] Patient Location:  Home Provider location:   Office  Primary Care Provider:  Jerrell Cleatus Ned, MD Primary Cardiologist:  Maude Emmer, MD  Chief Complaint / Patient Profile   78 y.o. y/o female with a h/o coronary artery calcification, PVCs, hypertension, hyperlipidemia, and tobacco use who is pending Cystoscopy with Bulkamid with Dr. Valli Shank of [facility] and presents today for telephonic preoperative cardiovascular risk assessment.  History of Present Illness    [name] [name] is a 78 y.o. female who presents via Web designer for a telehealth visit today.  Pt was last seen in cardiology clinic on 12/25/2023 by Artist Pouch, PA-C. At that time Tiasha E Steuck was doing well. The patient is now pending procedure as outlined above. Since her last visit, she has done well from a cardiac standpoint.  She denies chest pain, palpitations, dyspnea, pnd, orthopnea, n, v, dizziness, syncope, edema, weight gain, or early satiety. All other systems reviewed and are otherwise negative except as noted above.   Past Medical History    Past Medical History:  Diagnosis Date   Acquired short Achilles tendon, left    Anemia    Anxiety     Arthritis    hands and spine   Blood transfusion without reported diagnosis    Cancer (HCC)    basal cell lesion on left side of nose   Cataract    Cervical dysplasia 1976   Complication of anesthesia    COPD (chronic obstructive pulmonary disease) (HCC)    Depression    Diverticulitis of colon (without mention of hemorrhage)(562.11)    Diverticulosis of colon 05/06/2008   Diverticulosis of colon (without mention of hemorrhage)    Dysrhythmia    tachycardia   Elevated cholesterol    Emphysema of lung (HCC)    GERD (gastroesophageal reflux disease)    no meds now   Heart murmur    Hypertension    pt states she takes med for tachycardia NOT HTN -metoprolol  taken for tachycardia   Internal hemorrhoids without mention of complication    Mitral valve disorders(424.0)    prolapse per pt, stable as of 06-14-24   MRSA infection    Osteoporosis    spine   Palpitations    Pelvic adhesive disease    Plaque psoriasis    PONV (postoperative nausea and vomiting)    Pre-diabetes    Prediabetes    Psoriatic arthritis (HCC) 05/25/2016   Tachycardia    never had HTN   Vitamin D  deficiency    Past Surgical History:  Procedure Laterality Date   ABDOMINAL HYSTERECTOMY  08/29/1998   TAH,BSO endometriosis, leiomyomata   CALCANEAL OSTEOTOMY Left 06/25/2020   Procedure: Medializing calcaneal osteotomy;  Surgeon: Kit Rush, MD;  Location: [facility];  Service: Orthopedics;  Laterality: Left;   COLONOSCOPY     ECTOPIC PREGNANCY SURGERY  1971, 1979   ESOPHAGOGASTRODUODENOSCOPY     EYE SURGERY Bilateral    cataract extraction   FOOT ARTHRODESIS Left 06/25/2020   Procedure: Talonavicular Arthrodesis;  Surgeon: Kit Rush, MD;  Location: [facility];  Service: Orthopedics;  Laterality: Left;   GANGLION CYST EXCISION Right    hand    GASTROCNEMIUS RECESSION Left 06/25/2020   Procedure: Left gastroc recession;  Surgeon: Kit Rush, MD;  Location: [facility];  Service: Orthopedics;  Laterality: Left;   GYNECOLOGIC CRYOSURGERY  08/29/1974   HEMORRHOID SURGERY     OOPHORECTOMY     ROTATOR CUFF REPAIR Left    ROTATOR CUFF REPAIR Right 09/29/2013   TENOLYSIS Left 06/25/2020   Procedure: Left Posterior Tibial Tenolysis;  Surgeon: Kit Rush, MD;  Location: [facility];  Service: Orthopedics;  Laterality: Left;   TYMPANOPLASTY Right    UPPER GASTROINTESTINAL ENDOSCOPY      Allergies  Allergies  Allergen Reactions   Bactrim  [Sulfamethoxazole -Trimethoprim ] Swelling    Face and eyes swollen and red.   Ketek [Telithromycin]     Unknown    Home Medications    Prior to Admission medications   Medication Sig Start Date End Date Taking? Authorizing Provider  atorvastatin  (LIPITOR) 40 MG tablet Take 1 tablet (40 mg total) by mouth daily. 12/25/23   Trudy Birmingham, PA-C  Cholecalciferol (VITAMIN D3) 250 MCG (10000 UT) capsule Take 10,000 Units by mouth daily.    [provider]  cyanocobalamin (VITAMIN B12) 1000 MCG tablet Take 1,000 mcg by mouth daily.    [provider]  magnesium oxide (MAG-OX) 400 (240 Mg) MG tablet Take 400 mg by mouth daily.    [provider]  metoprolol  succinate (TOPROL -XL) 25 MG 24 hr tablet Take 1 tablet (25 mg total) by mouth daily. 11/28/23   Jerrell Cleatus Ned, MD  predniSONE  (DELTASONE ) 10 MG tablet Take 10 mg by mouth 2 (two) times daily with a meal. 06/02/24   [provider]  sertraline  (ZOLOFT ) 100 MG tablet Take 1 tablet (100 mg total) by mouth at bedtime. Take   1 tablet   Daily   for Mood                                                                                                       /                                                                   TAKE                                         BY  MOUTH 11/28/23   Jerrell Cleatus Ned, MD    Physical Exam    Vital Signs:  [name] [name]  does not have vital signs available for review today.  Given telephonic nature of communication, physical exam is limited. AAOx3. NAD. Normal affect.  Speech and respirations are unlabored.  Accessory Clinical Findings    None  Assessment & Plan    1.  Preoperative Cardiovascular Risk Assessment:  According to the Revised Cardiac Risk Index (RCRI), her Perioperative Risk of Major Cardiac Event is (%): 0.4. Her Functional Capacity in METs is: 5.62 according to the Duke Activity Status Index (DASI). Therefore, based on ACC/AHA guidelines, patient would be at acceptable risk for the planned procedure without further cardiovascular testing.   The patient was advised that if she develops new symptoms prior to surgery to contact our office to arrange for a follow-up visit, and she verbalized understanding.  A copy of this note will be routed to requesting surgeon.  Time:   Today, I have spent 5 minutes with the patient with telehealth technology discussing medical history, symptoms, and management plan.     Damien JAYSON Braver, NP  06/21/2024, 1:39 PM

## 2024-06-24 ENCOUNTER — Encounter (HOSPITAL_COMMUNITY): Payer: Self-pay

## 2024-06-24 NOTE — Progress Notes (Signed)
 Case: [id number] Date/Time: 06/25/24 1103   Procedure: CYSTOSCOPY, WITH INJECTION OF BLADDER NECK OR BLADDER WALL - CYSTOSCOPY WITH BULKAMID   Anesthesia type: Monitor Anesthesia Care   Diagnosis: Female stress incontinence [N39.3]   Pre-op diagnosis: FEMALE STRESS URINARY INCONTINENCE   Location: [facility]   Surgeons: Elisabeth Valli BIRCH, MD       DISCUSSION: [name] [name] is a 78 yo female with PMH of current smoking, CAD (by CT), MVP with mild MR, palpitations, COPD, GERD, prediabetes, anxiety.  Patient follows with Cardiology for palpitations on Metoprolol  and MVP with mild MR by Echo in 2024. Last seen in clinic on 12/25/23. Stable at that visit advised continue current meds. Had cardiac clearance tele visit on 06/21/24. Pt denied symptoms and was cleared for surgery:  Preoperative Cardiovascular Risk Assessment:  According to the Revised Cardiac Risk Index (RCRI), her Perioperative Risk of Major Cardiac Event is (%): 0.4. Her Functional Capacity in METs is: 5.62 according to the Duke Activity Status Index (DASI). Therefore, based on ACC/AHA guidelines, patient would be at acceptable risk for the planned procedure without further cardiovascular testing  Seen by PCP on 10/15 for UTI and rash. Completed prednisone  taper  VS: BP 127/68 Comment: right arm sitting  Pulse (!) 57   Temp 37.1 C (Oral)   Resp 14   Ht 5' 4 (1.626 m)   Wt 63.5 kg   SpO2 95%   BMI 24.03 kg/m   PROVIDERS: Jerrell Cleatus Ned, MD   LABS: Labs reviewed: Acceptable for surgery. Mild leukocytosis - recent prednisone  use (all labs ordered are listed, but only abnormal results are displayed)  Labs Reviewed  SURGICAL PCR SCREEN - Abnormal; Notable for the following components:      Result Value   Staphylococcus aureus POSITIVE (*)    All other components within normal limits  CBC - Abnormal; Notable for the following components:   WBC 12.8 (*)    All other components within  normal limits  BASIC METABOLIC PANEL WITH GFR     CT Chest 08/28/23:  IMPRESSION: 1. Lung-RADS 1S, negative. Continue annual screening with low-dose chest CT without contrast in 12 months. 2. The S modifier above refers to potentially clinically significant non lung cancer related findings. Specifically, there is aortic atherosclerosis, in addition to two-vessel coronary artery disease. Please note that although the presence of coronary artery calcium  documents the presence of coronary artery disease, the severity of this disease and any potential stenosis cannot be assessed on this non-gated CT examination. Assessment for potential risk factor modification, dietary therapy or pharmacologic therapy may be warranted, if clinically indicated. 3. Mild diffuse bronchial wall thickening with very mild centrilobular and paraseptal emphysema; imaging findings suggestive of underlying COPD. 4. Colonic diverticulosis.   Aortic Atherosclerosis (ICD10-I70.0) and Emphysema (ICD10-J43.9).  EKG 12/25/23:  Sinus rhythm with occasional Premature ventricular complexes When compared with ECG of 04-Feb-2023 12:50, Premature ventricular complexes are now Present  Echo 12/20/22:  IMPRESSIONS    1. Left ventricular ejection fraction, by estimation, is 60 to 65%. The left ventricle has normal function. The left ventricle has no regional wall motion abnormalities. There is moderate left ventricular hypertrophy. Left ventricular diastolic parameters are consistent with Grade I diastolic dysfunction (impaired relaxation). Elevated left ventricular end-diastolic pressure.  2. Right ventricular systolic function is normal. The right ventricular size is normal.  3. Left atrial size was mildly dilated.  4. Prolapse of posterior leaflet noted in apical images # 37. The mitral  valve is abnormal. Mild mitral valve regurgitation. No evidence of mitral stenosis. Moderate mitral annular calcification.   5. The aortic valve is tricuspid. There is mild calcification of the aortic valve. Aortic valve regurgitation is not visualized. Aortic valve sclerosis is present, with no evidence of aortic valve stenosis.  6. The inferior vena cava is normal in size with greater than 50% respiratory variability, suggesting right atrial pressure of 3 mmHg.  Past Medical History:  Diagnosis Date   Acquired short Achilles tendon, left    Anemia    Anxiety    Arthritis    hands and spine   Blood transfusion without reported diagnosis    Cancer (HCC)    basal cell lesion on left side of nose   Cataract    Cervical dysplasia 1976   Complication of anesthesia    COPD (chronic obstructive pulmonary disease) (HCC)    Depression    Diverticulitis of colon (without mention of hemorrhage)(562.11)    Diverticulosis of colon 05/06/2008   Diverticulosis of colon (without mention of hemorrhage)    Dysrhythmia    tachycardia   Elevated cholesterol    Emphysema of lung (HCC)    GERD (gastroesophageal reflux disease)    no meds now   Heart murmur    Hypertension    pt states she takes med for tachycardia NOT HTN -metoprolol  taken for tachycardia   Internal hemorrhoids without mention of complication    Mitral valve disorders(424.0)    prolapse per pt, stable as of 06-14-24   MRSA infection    Osteoporosis    spine   Palpitations    Pelvic adhesive disease    Plaque psoriasis    PONV (postoperative nausea and vomiting)    Pre-diabetes    Prediabetes    Psoriatic arthritis (HCC) 05/25/2016   Tachycardia    never had HTN   Vitamin D  deficiency     Past Surgical History:  Procedure Laterality Date   ABDOMINAL HYSTERECTOMY  08/29/1998   TAH,BSO endometriosis, leiomyomata   CALCANEAL OSTEOTOMY Left 06/25/2020   Procedure: Medializing calcaneal osteotomy;  Surgeon: Kit Rush, MD;  Location: [facility];  Service: Orthopedics;  Laterality: Left;   COLONOSCOPY     ECTOPIC PREGNANCY  SURGERY  1971, 1979   ESOPHAGOGASTRODUODENOSCOPY     EYE SURGERY Bilateral    cataract extraction   FOOT ARTHRODESIS Left 06/25/2020   Procedure: Talonavicular Arthrodesis;  Surgeon: Kit Rush, MD;  Location: [facility];  Service: Orthopedics;  Laterality: Left;   GANGLION CYST EXCISION Right    hand    GASTROCNEMIUS RECESSION Left 06/25/2020   Procedure: Left gastroc recession;  Surgeon: Kit Rush, MD;  Location: [facility];  Service: Orthopedics;  Laterality: Left;   GYNECOLOGIC CRYOSURGERY  08/29/1974   HEMORRHOID SURGERY     OOPHORECTOMY     ROTATOR CUFF REPAIR Left    ROTATOR CUFF REPAIR Right 09/29/2013   TENOLYSIS Left 06/25/2020   Procedure: Left Posterior Tibial Tenolysis;  Surgeon: Kit Rush, MD;  [facility];  Service: Orthopedics;  Laterality: Left;   TYMPANOPLASTY Right    UPPER GASTROINTESTINAL ENDOSCOPY      MEDICATIONS:  atorvastatin  (LIPITOR) 40 MG tablet   Cholecalciferol (VITAMIN D3) 250 MCG (10000 UT) capsule   cyanocobalamin (VITAMIN B12) 1000 MCG tablet   magnesium oxide (MAG-OX) 400 (240 Mg) MG tablet   metoprolol  succinate (TOPROL -XL) 25 MG 24 hr tablet   predniSONE  (DELTASONE ) 10 MG tablet  sertraline  (ZOLOFT ) 100 MG tablet   No current facility-administered medications for this encounter.   Burnard CHRISTELLA Odis DEVONNA [facility] Short Stay/Anesthesiology [place] Phone [phone] 06/24/2024 8:55 AM

## 2024-06-24 NOTE — Anesthesia Preprocedure Evaluation (Signed)
 Anesthesia Evaluation  Patient identified by MRN, date of birth, ID band Patient awake    Reviewed: Allergy & Precautions, NPO status , Patient's Chart, lab work & pertinent test results, reviewed documented beta blocker date and time   History of Anesthesia Complications (+) PONV and history of anesthetic complications  Airway Mallampati: II  TM Distance: >3 FB Neck ROM: Full    Dental  (+) Teeth Intact, Dental Advisory Given   Pulmonary Current Smoker and Patient abstained from smoking.   breath sounds clear to auscultation       Cardiovascular hypertension, Pt. on medications + dysrhythmias + Valvular Problems/Murmurs MVP  Rhythm:Regular Rate:Normal  TTE (2024):  1. Left ventricular ejection fraction, by estimation, is 60 to 65%. The  left ventricle has normal function. The left ventricle has no regional  wall motion abnormalities. There is moderate left ventricular hypertrophy.  Left ventricular diastolic  parameters are consistent with Grade I diastolic dysfunction (impaired  relaxation). Elevated left ventricular end-diastolic pressure.   2. Right ventricular systolic function is normal. The right ventricular  size is normal.   3. Left atrial size was mildly dilated.   4. Prolapse of posterior leaflet noted in apical images # 37. The mitral  valve is abnormal. Mild mitral valve regurgitation. No evidence of mitral  stenosis. Moderate mitral annular calcification.   5. The aortic valve is tricuspid. There is mild calcification of the  aortic valve. Aortic valve regurgitation is not visualized. Aortic valve  sclerosis is present, with no evidence of aortic valve stenosis.   6. The inferior vena cava is normal in size with greater than 50%  respiratory variability, suggesting right atrial pressure of 3 mmHg.      Neuro/Psych   Anxiety        GI/Hepatic ,GERD  Medicated and Controlled,,  Endo/Other    Renal/GU       Musculoskeletal   Abdominal   Peds  Hematology  (+) Blood dyscrasia, anemia   Anesthesia Other Findings   Reproductive/Obstetrics                              Anesthesia Physical Anesthesia Plan  ASA: 3  Anesthesia Plan: MAC   Post-op Pain Management:    Induction: Intravenous  PONV Risk Score and Plan: 2 and Ondansetron , Propofol  infusion and Treatment may vary due to age or medical condition  Airway Management Planned: Simple Face Mask  Additional Equipment:   Intra-op Plan:   Post-operative Plan:   Informed Consent:      Dental advisory given  Plan Discussed with:   Anesthesia Plan Comments: (See PAT note from 10/17 - 78 y.o. y/o female with a h/o coronary artery calcification, PVCs, hypertension, hyperlipidemia, and tobacco use. Previously on Prednisone  for dermatitis, however, tapered and no longer taking per patient/documentation.)         Anesthesia Quick Evaluation

## 2024-06-25 ENCOUNTER — Ambulatory Visit (HOSPITAL_BASED_OUTPATIENT_CLINIC_OR_DEPARTMENT_OTHER)

## 2024-06-25 ENCOUNTER — Encounter (HOSPITAL_COMMUNITY): Payer: Self-pay | Admitting: Urology

## 2024-06-25 ENCOUNTER — Encounter (HOSPITAL_COMMUNITY)
Admission: RE | Disposition: A | Discharge status: Home / Self Care | Payer: Self-pay | Source: Home / Self Care | Attending: Urology

## 2024-06-25 ENCOUNTER — Ambulatory Visit (HOSPITAL_COMMUNITY): Payer: Self-pay | Admitting: Physician Assistant

## 2024-06-25 ENCOUNTER — Ambulatory Visit (HOSPITAL_COMMUNITY)
Admission: RE | Admit: 2024-06-25 | Discharge: 2024-06-25 | Disposition: A | Discharge status: Home / Self Care | Attending: Urology | Admitting: Urology

## 2024-06-25 DIAGNOSIS — F17210 Nicotine dependence, cigarettes, uncomplicated: Secondary | ICD-10-CM | POA: Diagnosis not present

## 2024-06-25 DIAGNOSIS — K219 Gastro-esophageal reflux disease without esophagitis: Secondary | ICD-10-CM | POA: Insufficient documentation

## 2024-06-25 DIAGNOSIS — N3946 Mixed incontinence: Secondary | ICD-10-CM | POA: Diagnosis not present

## 2024-06-25 DIAGNOSIS — I10 Essential (primary) hypertension: Secondary | ICD-10-CM | POA: Insufficient documentation

## 2024-06-25 DIAGNOSIS — Z79899 Other long term (current) drug therapy: Secondary | ICD-10-CM | POA: Diagnosis not present

## 2024-06-25 DIAGNOSIS — N393 Stress incontinence (female) (male): Secondary | ICD-10-CM

## 2024-06-25 DIAGNOSIS — F419 Anxiety disorder, unspecified: Secondary | ICD-10-CM

## 2024-06-25 HISTORY — PX: CYSTOSCOPY WITH INJECTION: SHX1424

## 2024-06-25 SURGERY — CYSTOSCOPY, WITH INJECTION OF BLADDER NECK OR BLADDER WALL
Anesthesia: Monitor Anesthesia Care

## 2024-06-25 MED ORDER — CEFAZOLIN SODIUM-DEXTROSE 2-4 GM/100ML-% IV SOLN
2.0000 g | INTRAVENOUS | Status: AC
  Administered 2024-06-25: 2 g via INTRAVENOUS
  Filled 2024-06-25: qty 100

## 2024-06-25 MED ORDER — WATER FOR IRRIGATION, STERILE IR SOLN
Status: DC | PRN
  Administered 2024-06-25: 1000 mL

## 2024-06-25 MED ORDER — PROPOFOL 500 MG/50ML IV EMUL
INTRAVENOUS | Status: DC | PRN
  Administered 2024-06-25: 100 ug/kg/min via INTRAVENOUS

## 2024-06-25 MED ORDER — LACTATED RINGERS IV SOLN: INTRAVENOUS | Status: DC

## 2024-06-25 MED ORDER — PROPOFOL 10 MG/ML IV BOLUS
INTRAVENOUS | Status: DC | PRN
  Administered 2024-06-25: 10 mg via INTRAVENOUS
  Administered 2024-06-25 (×2): 20 mg via INTRAVENOUS

## 2024-06-25 MED ORDER — FENTANYL CITRATE (PF) 100 MCG/2ML IJ SOLN
INTRAMUSCULAR | Status: AC
  Filled 2024-06-25: qty 2

## 2024-06-25 MED ORDER — EPHEDRINE 5 MG/ML INJ
INTRAVENOUS | Status: AC
  Filled 2024-06-25: qty 5

## 2024-06-25 MED ORDER — ONDANSETRON HCL 4 MG/2ML IJ SOLN
INTRAMUSCULAR | Status: DC | PRN
  Administered 2024-06-25: 4 mg via INTRAVENOUS

## 2024-06-25 MED ORDER — CHLORHEXIDINE GLUCONATE 0.12 % MT SOLN
15.0000 mL | Freq: Once | OROMUCOSAL | Status: AC
  Administered 2024-06-25: 15 mL via OROMUCOSAL

## 2024-06-25 MED ORDER — LIDOCAINE HCL (PF) 2 % IJ SOLN
INTRAMUSCULAR | Status: AC
  Filled 2024-06-25: qty 5

## 2024-06-25 MED ORDER — ORAL CARE MOUTH RINSE: 15.0000 mL | Freq: Once | OROMUCOSAL | Status: AC

## 2024-06-25 MED ORDER — ONDANSETRON HCL 4 MG/2ML IJ SOLN
INTRAMUSCULAR | Status: AC
  Filled 2024-06-25: qty 2

## 2024-06-25 MED ORDER — EPHEDRINE SULFATE (PRESSORS) 25 MG/5ML IV SOSY
PREFILLED_SYRINGE | INTRAVENOUS | Status: DC | PRN
  Administered 2024-06-25: 10 mg via INTRAVENOUS

## 2024-06-25 SURGICAL SUPPLY — 17 items
BAG URO CATCHER STRL LF (MISCELLANEOUS) ×1 IMPLANT
CLOTH BEACON ORANGE TIMEOUT ST (SAFETY) ×1 IMPLANT
ELECT REM PT RETURN 15FT ADLT (MISCELLANEOUS) ×1 IMPLANT
GLOVE BIO SURGEON STRL SZ 6.5 (GLOVE) ×1 IMPLANT
GOWN STRL REUS W/ TWL LRG LVL3 (GOWN DISPOSABLE) ×1 IMPLANT
KIT TURNOVER KIT A (KITS) ×1 IMPLANT
MANIFOLD NEPTUNE II (INSTRUMENTS) ×1 IMPLANT
NDL ASPIRATION 22 (NEEDLE) ×1 IMPLANT
NDL SAFETY ECLIPSE 18X1.5 (NEEDLE) ×1 IMPLANT
NEEDLE ASPIRATION 22 (NEEDLE) IMPLANT
PACK CYSTO (CUSTOM PROCEDURE TRAY) ×1 IMPLANT
SYR 20ML LL LF (SYRINGE) ×1 IMPLANT
SYR CONTROL 10ML LL (SYRINGE) ×1 IMPLANT
SYSTEM URETHRAL BULK BULKAMID (Female Continence) IMPLANT
TUBING CONNECTING 10 (TUBING) ×1 IMPLANT
TUBING UROLOGY SET (TUBING) ×1 IMPLANT
WATER STERILE IRR 3000ML UROMA (IV SOLUTION) ×1 IMPLANT

## 2024-06-25 NOTE — Anesthesia Postprocedure Evaluation (Signed)
 Anesthesia Post Note  Patient: [name] [name]  Procedure(s) Performed: CYSTOSCOPY, WITH INJECTION OF BLADDER NECK OR BLADDER WALL     Patient location during evaluation: PACU Anesthesia Type: MAC Level of consciousness: awake Pain management: pain level controlled Vital Signs Assessment: post-procedure vital signs reviewed and stable Respiratory status: spontaneous breathing Cardiovascular status: blood pressure returned to baseline Postop Assessment: no apparent nausea or vomiting Anesthetic complications: no   No notable events documented.  Last Vitals:  Vitals:   06/25/24 1100 06/25/24 1108  BP: 126/65 (!) 163/72  Pulse: 65 67  Resp: 14 20  Temp: 36.6 C 36.6 C  SpO2: 94% 96%    Last Pain:  Vitals:   06/25/24 1108  TempSrc: Oral  PainSc: 0-No pain                 Lauraine KATHEE Birmingham

## 2024-06-25 NOTE — Op Note (Signed)
 Operative Note   Preoperative diagnosis:  1.  Stress urinary incontinence   Postoperative diagnosis: 1.  Stress urinary incontinence   Procedure(s): 1.  Cystoscopy with injection of bulkamid   Surgeon: Valli Shank, MD   Assistants:  None   Anesthesia:  General   Complications:  None   EBL:  minimal   Specimens: 1. none   Drains/Catheters: 1.  none   Intraoperative findings:   Normal urethra Bilateral orthotopic Uos Normal bladder mucosa   Indication: 77yo woman with symptomatic stress urinary incontinence.   Description of procedure:   After risks and benefits of the procedure discussed with the patient, informed consent was obtained.  The patient was taken to the operating placed in the supine position.  Anesthesia was induced and antibiotics were administered.  The patient was then repositioned in the dorsolithotomy position.  She was prepped and draped in usual sterile fashion a timeout performed with the attending present.  The cystoscope was assembled with the Bulkamid system.  It was then placed in the urethral meatus and advanced into the bladder under direct visualization.  Prior cystoscopy had been done which noted normal anatomic landmarks.  These were again verified during cystoscopy today.  The cystoscope was brought back to the bladder neck and the needle was advanced through the needle guide at the 1 o'clock position.  Once it was visualized and advanced it was rotated to the 5 o'clock position.  Bulkamid was then injected until bleb was seen.  This was then repeated at the 1 o'clock position in the 7 o'clock position until coaptation was noted.   This concluded the case.  The patient's bladder was left with approximately 200 cc of sterile saline.  The patient emerged from anesthesia and was transferred the PACU in stable condition.   Plan:  Plan for patient to void in PACU prior to discharge.

## 2024-06-25 NOTE — Transfer of Care (Signed)
 Immediate Anesthesia Transfer of Care Note  Patient: [name] [name]  Procedure(s) Performed: CYSTOSCOPY, WITH INJECTION OF BLADDER NECK OR BLADDER WALL  Patient Location: PACU  Anesthesia Type:MAC  Level of Consciousness: awake, alert , and oriented  Airway & Oxygen Therapy: Patient Spontanous Breathing and Patient connected to face mask oxygen  Post-op Assessment: Report given to RN and Post -op Vital signs reviewed and stable  Post vital signs: Reviewed and stable  Last Vitals:  Vitals Value Taken Time  BP    Temp    Pulse    Resp    SpO2      Last Pain:  Vitals:   06/25/24 0913  TempSrc:   PainSc: 0-No pain         Complications: No notable events documented.

## 2024-06-25 NOTE — Discharge Instructions (Signed)
Cystoscopy with Bulkamid patient instructions  Following a cystoscopy, a catheter (a flexible rubber tube) is sometimes left in place to empty the bladder. This may cause some discomfort or a feeling that you need to urinate. Your doctor determines the period of time that the catheter will be left in place. You may have bloody urine for two to three days (Call your doctor if the amount of bleeding increases or does not subside).  You may pass blood clots in your urine, especially if you had a biopsy. It is not unusual to pass small blood clots and have some bloody urine a couple of weeks after your cystoscopy. Again, call your doctor if the bleeding does not subside. You may have: Dysuria (painful urination) Frequency (urinating often) Urgency (strong desire to urinate)  These symptoms are common especially if medicine is instilled into the bladder or a ureteral stent is placed. Avoiding alcohol and caffeine, such as coffee, tea, and chocolate, may help relieve these symptoms. Drink plenty of water, unless otherwise instructed. Your doctor may also prescribe an antibiotic or other medicine to reduce these symptoms.  Cystoscopy results are available soon after the procedure; biopsy results usually take two to four days. Your doctor will discuss the results of your exam with you. Before you go home, you will be given specific instructions for follow-up care. Special Instructions:   If you are going home with a catheter in place do not take a tub bath until removed by your doctor.   You may resume your normal activities.   Do not drive or operate machinery if you are taking narcotic pain medicine.   Be sure to keep all follow-up appointments with your doctor.   Call Your Doctor If: The catheter is not draining You have severe pain You are unable to urinate You have a fever over 101 You have severe bleeding

## 2024-06-25 NOTE — H&P (Signed)
 History of present illness: 77 yo woman with symptomatic stress urinary incontinence here for bulkamid.  She's had a previous urodynamics and cystoscopy.  Myrbetriq has helped with urgency and urge incontinence.   Review of systems: A 12 point comprehensive review of systems was obtained and is negative unless otherwise stated in the history of present illness.  Patient Active Problem List   Diagnosis Date Noted   Acute cystitis 06/13/2024   Psoriasis 05/30/2024   Contact dermatitis due to rhus toxicodendron 05/30/2024   Prediabetes 05/30/2024   Palpitations 11/28/2023   Tobacco use 04/25/2019   Depression, major, recurrent, in partial remission 07/31/2018   Mixed hyperlipidemia 07/01/2014   Hypertension     No current facility-administered medications on file prior to encounter.   Current Outpatient Medications on File Prior to Encounter  Medication Sig Dispense Refill   atorvastatin  (LIPITOR) 40 MG tablet Take 1 tablet (40 mg total) by mouth daily. 90 tablet 3   Cholecalciferol (VITAMIN D3) 250 MCG (10000 UT) capsule Take 10,000 Units by mouth daily.     cyanocobalamin (VITAMIN B12) 1000 MCG tablet Take 1,000 mcg by mouth daily.     magnesium oxide (MAG-OX) 400 (240 Mg) MG tablet Take 400 mg by mouth daily.     metoprolol  succinate (TOPROL -XL) 25 MG 24 hr tablet Take 1 tablet (25 mg total) by mouth daily. 90 tablet 3   sertraline  (ZOLOFT ) 100 MG tablet Take 1 tablet (100 mg total) by mouth at bedtime. Take   1 tablet   Daily   for Mood                                                                                                       /                                                                   TAKE                                         BY                                                 MOUTH 90 tablet 3    Past Medical History:  Diagnosis Date   Acquired short Achilles tendon, left    Anemia    Anxiety    Arthritis    hands and spine   Blood transfusion  without reported diagnosis    Cancer (HCC)    basal cell lesion on left side of nose   Cataract    Cervical dysplasia 1976  Complication of anesthesia    COPD (chronic obstructive pulmonary disease) (HCC)    Depression    Diverticulitis of colon (without mention of hemorrhage)(562.11)    Diverticulosis of colon 05/06/2008   Diverticulosis of colon (without mention of hemorrhage)    Dysrhythmia    tachycardia   Elevated cholesterol    GERD (gastroesophageal reflux disease)    no meds now   Heart murmur    Internal hemorrhoids without mention of complication    Mitral valve disorders(424.0)    prolapse per pt, stable as of 06-14-24   MRSA infection    Osteoporosis    spine   Palpitations    Pelvic adhesive disease    Plaque psoriasis    PONV (postoperative nausea and vomiting)    Pre-diabetes    Psoriatic arthritis (HCC) 05/25/2016   Tachycardia    never had HTN   Vitamin D  deficiency     Past Surgical History:  Procedure Laterality Date   ABDOMINAL HYSTERECTOMY  08/29/1998   TAH,BSO endometriosis, leiomyomata   CALCANEAL OSTEOTOMY Left 06/25/2020   Procedure: Medializing calcaneal osteotomy;  Surgeon: Kit Rush, MD;  Location: [facility];  Service: Orthopedics;  Laterality: Left;   COLONOSCOPY     ECTOPIC PREGNANCY SURGERY  1971, 1979   ESOPHAGOGASTRODUODENOSCOPY     EYE SURGERY Bilateral    cataract extraction   FOOT ARTHRODESIS Left 06/25/2020   Procedure: Talonavicular Arthrodesis;  Surgeon: Kit Rush, MD;  Location: [facility];  Service: Orthopedics;  Laterality: Left;   GANGLION CYST EXCISION Right    hand    GASTROCNEMIUS RECESSION Left 06/25/2020   Procedure: Left gastroc recession;  Surgeon: Kit Rush, MD;  Location: [facility];  Service: Orthopedics;  Laterality: Left;   GYNECOLOGIC CRYOSURGERY  08/29/1974   HEMORRHOID SURGERY     OOPHORECTOMY     ROTATOR CUFF REPAIR Left    ROTATOR CUFF REPAIR Right  09/29/2013   TENOLYSIS Left 06/25/2020   Procedure: Left Posterior Tibial Tenolysis;  Surgeon: Kit Rush, MD;  Location:  SURGERY CENTER;  Service: Orthopedics;  Laterality: Left;   TYMPANOPLASTY Right    UPPER GASTROINTESTINAL ENDOSCOPY      Social History   Tobacco Use   Smoking status: Some Days    Current packs/day: 0.00    Average packs/day: 0.6 packs/day for 54.8 years (32.9 ttl pk-yrs)    Types: Cigarettes    Start date: 57    Last attempt to quit: 06/10/2020    Years since quitting: 4.0   Smokeless tobacco: Never   Tobacco comments:    Smokes when she drinks wine  Vaping Use   Vaping status: Never Used  Substance Use Topics   Alcohol use: Yes    Comment: social   Drug use: No    Family History  Problem Relation Age of Onset   Diabetes Mother    Hypertension Mother    Melanoma Mother    Alcohol abuse Mother    Arthritis Mother    Stroke Mother    Diabetes Father    Heart disease Father    Hyperlipidemia Father    Hypertension Father    Cirrhosis Father        alcoholic   Alcohol abuse Father    Uterine cancer Sister        UTERINE   Asthma Sister    Cancer Sister    Colon cancer Maternal Grandmother        dx in her  80's ?mets from lymphoma   Stomach cancer Maternal Grandmother    Lymphoma Maternal Grandmother    Cancer Maternal Grandmother    Heart disease Maternal Grandfather    Heart disease Paternal Grandmother    Heart disease Paternal Grandfather    Colon polyps Sister    Esophageal cancer Neg Hx    Rectal cancer Neg Hx    Kidney disease Neg Hx    Pancreatic cancer Neg Hx     PE: Vitals:   06/25/24 0913  Weight: 63.5 kg  Height: 5' 4 (1.626 m)   Patient appears to be in no acute distress  patient is alert and oriented x3 Atraumatic normocephalic head No increased work of breathing, no audible wheezes/rhonchi Lower extremities are symmetric without appreciable edema Grossly neurologically intact No identifiable skin  lesions  No results for input(s): WBC, HGB, HCT in the last 72 hours. No results for input(s): NA, K, CL, CO2, GLUCOSE, BUN, CREATININE, CALCIUM  in the last 72 hours. No results for input(s): LABPT, INR in the last 72 hours. No results for input(s): LABURIN in the last 72 hours. Results for orders placed or performed during the hospital encounter of 06/14/24  Surgical pcr screen     Status: Abnormal   Collection Time: 06/14/24  2:00 PM   Specimen: Nasal Mucosa; Nasal Swab  Result Value Ref Range Status   MRSA, PCR NEGATIVE NEGATIVE Final   Staphylococcus aureus POSITIVE (A) NEGATIVE Final    Comment: (NOTE) The Xpert SA Assay (FDA approved for NASAL specimens in patients 72 years of age and older), is one component of a comprehensive surveillance program. It is not intended to diagnose infection nor to guide or monitor treatment. Performed at [facility], [address] [place], [place] [address]    Assessment/Plan: Stress urinary incontinence: -risks and benefits discussed with patient including but not limited to pain, bleeding, infection, damage to surrounding structures, urinary retention -scheduled for bulkamid today    Zafar Debrosse D Falesha Schommer

## 2024-06-26 ENCOUNTER — Encounter (HOSPITAL_COMMUNITY): Payer: Self-pay | Admitting: Urology

## 2024-07-16 ENCOUNTER — Other Ambulatory Visit: Payer: Self-pay | Admitting: Urology

## 2024-07-16 DIAGNOSIS — R1011 Right upper quadrant pain: Secondary | ICD-10-CM

## 2024-07-24 ENCOUNTER — Ambulatory Visit

## 2024-08-14 ENCOUNTER — Ambulatory Visit

## 2024-08-14 VITALS — Ht 64.0 in | Wt 140.0 lb

## 2024-08-14 DIAGNOSIS — Z720 Tobacco use: Secondary | ICD-10-CM | POA: Diagnosis not present

## 2024-08-14 DIAGNOSIS — Z0000 Encounter for general adult medical examination without abnormal findings: Secondary | ICD-10-CM

## 2024-08-14 NOTE — Progress Notes (Signed)
 Chief Complaint  Patient presents with   Medicare Wellness     Subjective:   [name] [name] is a 78 y.o. female who presents for a Medicare Annual Wellness Visit.  Visit info / Clinical Intake: Medicare Wellness Visit Type:: Subsequent Annual Wellness Visit Persons participating in visit and providing information:: patient Medicare Wellness Visit Mode:: Telephone If telephone:: video declined Since this visit was completed virtually, some vitals may be partially provided or unavailable. Missing vitals are due to the limitations of the virtual format.: Unable to obtain vitals - no equipment If Telephone or Video please confirm:: I connected with patient using audio/video enable telemedicine. I verified patient identity with two identifiers, discussed telehealth limitations, and patient agreed to proceed. Patient Location:: Home Provider Location:: Home Interpreter Needed?: No Pre-visit prep was completed: yes AWV questionnaire completed by patient prior to visit?: no Living arrangements:: lives with spouse/significant other Patient's Overall Health Status Rating: excellent Typical amount of pain: none Does pain affect daily life?: no Are you currently prescribed opioids?: no  Dietary Habits and Nutritional Risks How many meals a day?: 2 Eats fruit and vegetables daily?: (!) no (veggies daily) Most meals are obtained by: preparing own meals In the last 2 weeks, have you had any of the following?: none Diabetic:: no  Functional Status Activities of Daily Living (to include ambulation/medication): Independent Ambulation: Independent with device- listed below Home Assistive Devices/Equipment: Eyeglasses Medication Administration: Independent Home Management (perform basic housework or laundry): Independent Manage your own finances?: yes Primary transportation is: driving Concerns about vision?: no *vision screening is required for WTM* Concerns about hearing?:  no  Fall Screening Falls in the past year?: 0 Number of falls in past year: 0 Was there an injury with Fall?: 0 Fall Risk Category Calculator: 0 Patient Fall Risk Level: Low Fall Risk  Fall Risk Patient at Risk for Falls Due to: No Fall Risks Fall risk Follow up: Falls evaluation completed; Falls prevention discussed  Home and Transportation Safety: All rugs have non-skid backing?: N/A, no rugs All stairs or steps have railings?: yes Grab bars in the bathtub or shower?: (!) no Have non-skid surface in bathtub or shower?: yes Good home lighting?: yes Regular seat belt use?: yes Hospital stays in the last year:: no  Cognitive Assessment Difficulty concentrating, remembering, or making decisions? : no Will 6CIT or Mini Cog be Completed: yes What year is it?: 0 points What month is it?: 0 points Give patient an address phrase to remember (5 components): [address], [place], [place] About what time is it?: 0 points Count backwards from 20 to 1: 0 points Say the months of the year in reverse: 0 points Repeat the address phrase from earlier: 0 points 6 CIT Score: 0 points  Advance Directives (For Healthcare) Does Patient Have a Medical Advance Directive?: Yes Does patient want to make changes to medical advance directive?: No - Patient declined Type of Advance Directive: Healthcare Power of [place]; Living will Copy of Healthcare Power of Attorney in Chart?: No - copy requested Copy of Living Will in Chart?: No - copy requested  Reviewed/Updated  Reviewed/Updated: Reviewed All (Medical, Surgical, Family, Medications, Allergies, Care Teams, Patient Goals)    Allergies (verified) Bactrim  [sulfamethoxazole -trimethoprim ] and Ketek [telithromycin]   Current Medications (verified) Outpatient Encounter Medications as of 08/14/2024  Medication Sig   atorvastatin  (LIPITOR) 40 MG tablet Take 1 tablet (40 mg total) by mouth daily.   Cholecalciferol (VITAMIN D3) 250 MCG (10000 UT)  capsule Take  10,000 Units by mouth daily.   cyanocobalamin (VITAMIN B12) 1000 MCG tablet Take 1,000 mcg by mouth daily.   magnesium oxide (MAG-OX) 400 (240 Mg) MG tablet Take 400 mg by mouth daily.   metoprolol  succinate (TOPROL -XL) 25 MG 24 hr tablet Take 1 tablet (25 mg total) by mouth daily.   predniSONE  (DELTASONE ) 10 MG tablet Take 10 mg by mouth 2 (two) times daily with a meal.   sertraline  (ZOLOFT ) 100 MG tablet Take 1 tablet (100 mg total) by mouth at bedtime. Take   1 tablet   Daily   for Mood                                                                                                       /                                                                   TAKE                                         BY                                                 MOUTH   No facility-administered encounter medications on file as of 08/14/2024.    History: Past Medical History:  Diagnosis Date   Acquired short Achilles tendon, left    Anemia    Anxiety    Arthritis    hands and spine   Blood transfusion without reported diagnosis    Cancer (HCC)    basal cell lesion on left side of nose   Cataract    Cervical dysplasia 1976   Complication of anesthesia    COPD (chronic obstructive pulmonary disease) (HCC)    Depression    Diverticulitis of colon (without mention of hemorrhage)(562.11)    Diverticulosis of colon 05/06/2008   Diverticulosis of colon (without mention of hemorrhage)    Dysrhythmia    tachycardia   Elevated cholesterol    GERD (gastroesophageal reflux disease)    no meds now   Heart murmur    Internal hemorrhoids without mention of complication    Mitral valve disorders(424.0)    prolapse per pt, stable as of 06-14-24   MRSA infection    Osteoporosis    spine   Palpitations    Pelvic adhesive disease    Plaque psoriasis    PONV (postoperative nausea and vomiting)    Pre-diabetes    Psoriatic arthritis (HCC) 05/25/2016   Tachycardia    never had HTN   Vitamin  D deficiency  Past Surgical History:  Procedure Laterality Date   ABDOMINAL HYSTERECTOMY  08/29/1998   TAH,BSO endometriosis, leiomyomata   CALCANEAL OSTEOTOMY Left 06/25/2020   Procedure: Medializing calcaneal osteotomy;  Surgeon: Kit Rush, MD;  Location: [facility];  Service: Orthopedics;  Laterality: Left;   COLONOSCOPY     CYSTOSCOPY WITH INJECTION N/A 06/25/2024   Procedure: CYSTOSCOPY, WITH INJECTION OF BLADDER NECK OR BLADDER WALL;  Surgeon: Elisabeth Valli BIRCH, MD;  Location: [facility];  Service: Urology;  Laterality: N/A;  CYSTOSCOPY WITH BULKAMID   ECTOPIC PREGNANCY SURGERY  1971, 1979   ESOPHAGOGASTRODUODENOSCOPY     EYE SURGERY Bilateral    cataract extraction   FOOT ARTHRODESIS Left 06/25/2020   Procedure: Talonavicular Arthrodesis;  Surgeon: Kit Rush, MD;  Location: [facility];  Service: Orthopedics;  Laterality: Left;   GANGLION CYST EXCISION Right    hand    GASTROCNEMIUS RECESSION Left 06/25/2020   Procedure: Left gastroc recession;  Surgeon: Kit Rush, MD;  Location: [facility];  Service: Orthopedics;  Laterality: Left;   GYNECOLOGIC CRYOSURGERY  08/29/1974   HEMORRHOID SURGERY     OOPHORECTOMY     ROTATOR CUFF REPAIR Left    ROTATOR CUFF REPAIR Right 09/29/2013   TENOLYSIS Left 06/25/2020   Procedure: Left Posterior Tibial Tenolysis;  Surgeon: Kit Rush, MD;  Location:  SURGERY CENTER;  Service: Orthopedics;  Laterality: Left;   TYMPANOPLASTY Right    UPPER GASTROINTESTINAL ENDOSCOPY     Family History  Problem Relation Age of Onset   Diabetes Mother    Hypertension Mother    Melanoma Mother    Alcohol abuse Mother    Arthritis Mother    Stroke Mother    Diabetes Father    Heart disease Father    Hyperlipidemia Father    Hypertension Father    Cirrhosis Father        alcoholic   Alcohol abuse Father    Uterine cancer Sister        UTERINE   Asthma Sister    Cancer Sister    Colon  cancer Maternal Grandmother        dx in her 22's ?mets from lymphoma   Stomach cancer Maternal Grandmother    Lymphoma Maternal Grandmother    Cancer Maternal Grandmother    Heart disease Maternal Grandfather    Heart disease Paternal Grandmother    Heart disease Paternal Grandfather    Colon polyps Sister    Esophageal cancer Neg Hx    Rectal cancer Neg Hx    Kidney disease Neg Hx    Pancreatic cancer Neg Hx    Social History   Occupational History   Occupation: retired  Tobacco Use   Smoking status: Some Days    Current packs/day: 0.00    Average packs/day: 0.6 packs/day for 54.8 years (32.9 ttl pk-yrs)    Types: Cigarettes    Start date: 57    Last attempt to quit: 06/10/2020    Years since quitting: 4.1   Smokeless tobacco: Never   Tobacco comments:    Smokes when she drinks wine  Vaping Use   Vaping status: Never Used  Substance and Sexual Activity   Alcohol use: Yes    Comment: social   Drug use: No   Sexual activity: Yes    Birth control/protection: Surgical   Tobacco Counseling Ready to quit: Not Answered Counseling given: Not Answered Tobacco comments: Smokes when she drinks wine  SDOH Screenings  Food Insecurity: No Food Insecurity (08/14/2024)  Housing: Unknown (08/14/2024)  Transportation Needs: No Transportation Needs (08/14/2024)  Utilities: Not At Risk (08/14/2024)  Alcohol Screen: Low Risk (06/13/2024)  Depression (PHQ2-9): Low Risk (08/14/2024)  Financial Resource Strain: Low Risk (06/13/2024)  Physical Activity: Inactive (08/14/2024)  Social Connections: Moderately Isolated (08/14/2024)  Stress: Stress Concern Present (08/14/2024)  Tobacco Use: High Risk (08/14/2024)  Health Literacy: Adequate Health Literacy (08/14/2024)   See flowsheets for full screening details  Depression Screen PHQ 2 & 9 Depression Scale- Over the past 2 weeks, how often have you been bothered by any of the following problems? Little interest or pleasure in  doing things: 0 Feeling down, depressed, or hopeless (PHQ Adolescent also includes...irritable): 2 PHQ-2 Total Score: 0 Trouble falling or staying asleep, or sleeping too much: 0 Feeling tired or having little energy: 0 Poor appetite or overeating (PHQ Adolescent also includes...weight loss): 0 Feeling bad about yourself - or that you are a failure or have let yourself or your family down: 0 Trouble concentrating on things, such as reading the newspaper or watching television (PHQ Adolescent also includes...like school work): 0 Moving or speaking so slowly that other people could have noticed. Or the opposite - being so fidgety or restless that you have been moving around a lot more than usual: 0 Thoughts that you would be better off dead, or of hurting yourself in some way: 0 PHQ-9 Total Score: 0 If you checked off any problems, how difficult have these problems made it for you to do your work, take care of things at home, or get along with other people?: Not difficult at all  Depression Treatment Depression Interventions/Treatment : Referral to Psychiatry     Goals Addressed             This Visit's Progress    Patient Stated       Not at this time/2025             Objective:    Today's Vitals   08/14/24 1339  Weight: 140 lb (63.5 kg)  Height: 5' 4 (1.626 m)   Body mass index is 24.03 kg/m.  Hearing/Vision screen Hearing Screening - Comments:: Denies hearing difficulties   Vision Screening - Comments:: Wears eyeglasses for reading/UTD/Dr. Cleotilde Immunizations and Health Maintenance Health Maintenance  Topic Date Due   Zoster Vaccines- Shingrix (1 of 2) Never done   COVID-19 Vaccine (4 - 2025-26 season) 04/29/2024   Lung Cancer Screening  08/27/2024   DTaP/Tdap/Td (3 - Tdap) 04/26/2025   Mammogram  06/25/2025   Medicare Annual Wellness (AWV)  08/14/2025   Pneumococcal Vaccine: 50+ Years  Completed   Influenza Vaccine  Completed   Bone Density Scan   Completed   Hepatitis C Screening  Completed   Meningococcal B Vaccine  Aged Out   Colonoscopy  Discontinued        Assessment/Plan:  This is a routine wellness examination for [name] [name].  Patient Care Team: Jerrell Cleatus Ned, MD as PCP - General (Internal Medicine) Delford Maude BROCKS, MD as PCP - Cardiology (Cardiology) Melita Drivers, MD as Consulting Physician (Orthopedic Surgery) Shari Easter, MD as Consulting Physician (Orthopedic Surgery) Floy Lynwood PARAS, MD (Inactive) as Consulting Physician (Otolaryngology) Delford Maude BROCKS, MD as Consulting Physician (Cardiology) Alline Lenis, MD (Inactive) as Consulting Physician (Urology) Fontaine, Evalene SQUIBB, MD (Inactive) as Consulting Physician (Gynecology) [facility], Tawni CROME, MD as Referring Physician (Dermatology) Cleotilde Sewer, OD (Optometry) Bernardino Klippel , DDS [place], A[place], [facility] (Inactive) as  Pharmacist (Pharmacist) Wertman, Sara E, PA-C (Neurology)  I have personally reviewed and noted the following in the patients chart:   Medical and social history Use of alcohol, tobacco or illicit drugs  Current medications and supplements including opioid prescriptions. Functional ability and status Nutritional status Physical activity Advanced directives List of other physicians Hospitalizations, surgeries, and ER visits in previous 12 months Vitals Screenings to include cognitive, depression, and falls Referrals and appointments  No orders of the defined types were placed in this encounter.  In addition, I have reviewed and discussed with patient certain preventive protocols, quality metrics, and best practice recommendations. A written personalized care plan for preventive services as well as general preventive health recommendations were provided to patient.   Haide Klinker L Senay Sistrunk, CMA   08/14/2024   Return in 1 year (on 08/14/2025).  After Visit Summary: (MyChart) Due to this being a telephonic visit, the after visit  summary with patients personalized plan was offered to patient via MyChart   Nurse Notes: Patient is eligible for a low dose lung cancer screening and order has been placed. Patient declines any Shingrix vaccines.  Patient had no other concerns to address today.

## 2024-08-14 NOTE — Patient Instructions (Addendum)
 [name] [name],  Thank you for taking the time for your Medicare Wellness Visit. I appreciate your continued commitment to your health goals. Please review the care plan we discussed, and feel free to reach out if I can assist you further.  Please note that Annual Wellness Visits do not include a physical exam. Some assessments may be limited, especially if the visit was conducted virtually. If needed, we may recommend an in-person follow-up with your provider.  Ongoing Care Seeing your primary care provider every 3 to 6 months helps us  monitor your health and provide consistent, personalized care. Next office visit on 12/03/2024.  Each day, aim for 6 glasses of water , plenty of protein in your diet and try to get up and walk/ stretch every hour for 5-10 minutes at a time.    Referrals If a referral was made during today's visit and you haven't received any updates within two weeks, please contact the referred provider directly to check on the status.  Please call to get scheduled for a lung cancer screening if you do not hear from their office within 1 week.    [facility] Address: [address] [place], [place], [place] [address] Phone: [phone]   Recommended Screenings:  Health Maintenance  Topic Date Due   Zoster (Shingles) Vaccine (1 of 2) Never done   COVID-19 Vaccine (4 - 2025-26 season) 04/29/2024   Screening for Lung Cancer  08/27/2024   Medicare Annual Wellness Visit  11/27/2024   DTaP/Tdap/Td vaccine (3 - Tdap) 04/26/2025   Breast Cancer Screening  06/25/2025   Pneumococcal Vaccine for age over 57  Completed   Flu Shot  Completed   Osteoporosis screening with Bone Density Scan  Completed   Hepatitis C Screening  Completed   Meningitis B Vaccine  Aged Out   Colon Cancer Screening  Discontinued       08/14/2024    1:46 PM  Advanced Directives  Does Patient Have a Medical Advance Directive? Yes  Type of Estate Agent of [place];Living will  Copy of  Healthcare Power of Attorney in Chart? No - copy requested    Vision: Annual vision screenings are recommended for early detection of glaucoma, cataracts, and diabetic retinopathy. These exams can also reveal signs of chronic conditions such as diabetes and high blood pressure.  Dental: Annual dental screenings help detect early signs of oral cancer, gum disease, and other conditions linked to overall health, including heart disease and diabetes.  Please see the attached documents for additional preventive care recommendations.

## 2024-08-15 ENCOUNTER — Encounter: Payer: PPO | Admitting: Nurse Practitioner

## 2024-08-28 ENCOUNTER — Ambulatory Visit
Admission: RE | Admit: 2024-08-28 | Discharge: 2024-08-28 | Disposition: A | Discharge status: Home / Self Care | Source: Ambulatory Visit | Attending: Student in an Organized Health Care Education/Training Program | Admitting: Student in an Organized Health Care Education/Training Program

## 2024-08-28 DIAGNOSIS — Z720 Tobacco use: Secondary | ICD-10-CM

## 2024-09-04 ENCOUNTER — Encounter: Payer: Self-pay | Admitting: Student in an Organized Health Care Education/Training Program

## 2024-09-04 NOTE — Telephone Encounter (Signed)
 Under imaging there is a scan from 08/28/2024 at 2:03 pm from [facility], RT this appears to be initial questionnaire without results attached  No other documents at this time.

## 2024-09-09 ENCOUNTER — Ambulatory Visit: Payer: Self-pay | Admitting: Student in an Organized Health Care Education/Training Program

## 2024-09-09 DIAGNOSIS — R911 Solitary pulmonary nodule: Secondary | ICD-10-CM | POA: Insufficient documentation

## 2024-12-03 ENCOUNTER — Ambulatory Visit: Admitting: Student in an Organized Health Care Education/Training Program
